# Patient Record
Sex: Female | Born: 1935 | Race: White | Hispanic: No | Smoking: Former smoker
Health system: Southern US, Community
[De-identification: ages and names within clinical notes are randomized; demographics above are authoritative.]

## PROBLEM LIST (undated history)

## (undated) DIAGNOSIS — E039 Hypothyroidism, unspecified: Secondary | ICD-10-CM

## (undated) DIAGNOSIS — I5022 Chronic systolic (congestive) heart failure: Secondary | ICD-10-CM

## (undated) DIAGNOSIS — I480 Paroxysmal atrial fibrillation: Secondary | ICD-10-CM

## (undated) DIAGNOSIS — C801 Malignant (primary) neoplasm, unspecified: Secondary | ICD-10-CM

## (undated) DIAGNOSIS — I10 Essential (primary) hypertension: Secondary | ICD-10-CM

## (undated) DIAGNOSIS — E785 Hyperlipidemia, unspecified: Secondary | ICD-10-CM

## (undated) DIAGNOSIS — R609 Edema, unspecified: Secondary | ICD-10-CM

## (undated) DIAGNOSIS — I5020 Unspecified systolic (congestive) heart failure: Secondary | ICD-10-CM

## (undated) DIAGNOSIS — R0600 Dyspnea, unspecified: Secondary | ICD-10-CM

## (undated) DIAGNOSIS — J449 Chronic obstructive pulmonary disease, unspecified: Secondary | ICD-10-CM

## (undated) DIAGNOSIS — I8390 Asymptomatic varicose veins of unspecified lower extremity: Secondary | ICD-10-CM

## (undated) DIAGNOSIS — H3530 Unspecified macular degeneration: Secondary | ICD-10-CM

## (undated) HISTORY — DX: Hyperlipidemia, unspecified: E78.5

## (undated) HISTORY — DX: Essential (primary) hypertension: I10

## (undated) HISTORY — DX: Asymptomatic varicose veins of unspecified lower extremity: I83.90

## (undated) HISTORY — DX: Unspecified macular degeneration: H35.30

## (undated) HISTORY — DX: Chronic obstructive pulmonary disease, unspecified: J44.9

---
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
## 2010-01-15 ENCOUNTER — Emergency Department: Payer: Self-pay | Admitting: Emergency Medicine

## 2010-07-28 HISTORY — PX: SALIVARY GLAND SURGERY: SHX768

## 2010-08-13 ENCOUNTER — Ambulatory Visit: Payer: Self-pay | Admitting: Unknown Physician Specialty

## 2010-08-15 LAB — PATHOLOGY REPORT

## 2011-08-14 ENCOUNTER — Ambulatory Visit: Payer: Self-pay | Admitting: Unknown Physician Specialty

## 2011-08-14 LAB — PLATELET COUNT: Platelet: 252 10*3/uL (ref 150–440)

## 2011-08-14 LAB — APTT: Activated PTT: 30.2 secs (ref 23.6–35.9)

## 2011-08-14 LAB — PROTIME-INR
INR: 0.9
Prothrombin Time: 12.1 secs (ref 11.5–14.7)

## 2012-05-04 ENCOUNTER — Ambulatory Visit: Payer: Self-pay | Admitting: Gastroenterology

## 2012-05-05 LAB — PATHOLOGY REPORT

## 2012-06-03 ENCOUNTER — Ambulatory Visit: Payer: Self-pay | Admitting: Family Medicine

## 2012-06-03 LAB — CBC WITH DIFFERENTIAL/PLATELET
Basophil #: 0 10*3/uL (ref 0.0–0.1)
Basophil %: 0.3 %
Eosinophil #: 0 10*3/uL (ref 0.0–0.7)
HCT: 44.8 % (ref 35.0–47.0)
HGB: 14.6 g/dL (ref 12.0–16.0)
Lymphocyte %: 8.9 %
MCHC: 32.5 g/dL (ref 32.0–36.0)
Monocyte %: 3.7 %
Neutrophil %: 86.9 %
RDW: 12.4 % (ref 11.5–14.5)
WBC: 14.3 10*3/uL — ABNORMAL HIGH (ref 3.6–11.0)

## 2012-06-03 LAB — BASIC METABOLIC PANEL
BUN: 17 mg/dL (ref 7–18)
Calcium, Total: 9.1 mg/dL (ref 8.5–10.1)
Co2: 31 mmol/L (ref 21–32)
EGFR (African American): >60
EGFR (Non-African Amer.): >60
Glucose: 133 mg/dL — ABNORMAL HIGH (ref 65–99)
Osmolality: 279 (ref 275–301)
Potassium: 4 mmol/L (ref 3.5–5.1)
Sodium: 138 mmol/L (ref 136–145)

## 2012-06-04 ENCOUNTER — Emergency Department: Payer: Self-pay | Admitting: Emergency Medicine

## 2012-06-04 LAB — COMPREHENSIVE METABOLIC PANEL
Anion Gap: 11 (ref 7–16)
BUN: 14 mg/dL (ref 7–18)
Bilirubin,Total: 0.9 mg/dL (ref 0.2–1.0)
Calcium, Total: 8.8 mg/dL (ref 8.5–10.1)
Co2: 24 mmol/L (ref 21–32)
Creatinine: 0.63 mg/dL (ref 0.60–1.30)
EGFR (Non-African Amer.): >60
Glucose: 124 mg/dL — ABNORMAL HIGH (ref 65–99)
Osmolality: 278 (ref 275–301)
Potassium: 5.4 mmol/L — ABNORMAL HIGH (ref 3.5–5.1)
Sodium: 138 mmol/L (ref 136–145)

## 2012-06-04 LAB — CBC
HCT: 42.4 % (ref 35.0–47.0)
HGB: 14.1 g/dL (ref 12.0–16.0)
MCH: 31.4 pg (ref 26.0–34.0)
MCHC: 33.2 g/dL (ref 32.0–36.0)
MCV: 95 fL (ref 80–100)
RBC: 4.48 10*6/uL (ref 3.80–5.20)
RDW: 12.5 % (ref 11.5–14.5)
WBC: 13.9 10*3/uL — ABNORMAL HIGH (ref 3.6–11.0)

## 2012-06-04 LAB — LIPASE, BLOOD: Lipase: 141 U/L (ref 73–393)

## 2012-06-04 LAB — PROTIME-INR: INR: 0.9

## 2012-06-16 ENCOUNTER — Other Ambulatory Visit: Payer: Self-pay | Admitting: Gastroenterology

## 2013-07-28 HISTORY — PX: MOHS SURGERY: SUR867

## 2015-09-06 ENCOUNTER — Encounter: Payer: Self-pay | Admitting: *Deleted

## 2015-09-10 ENCOUNTER — Ambulatory Visit (INDEPENDENT_AMBULATORY_CARE_PROVIDER_SITE_OTHER): Payer: Medicare Other | Admitting: Obstetrics and Gynecology

## 2015-09-10 ENCOUNTER — Encounter: Payer: Self-pay | Admitting: Obstetrics and Gynecology

## 2015-09-10 VITALS — BP 192/84 | HR 91 | Resp 16 | Ht 60.0 in | Wt 103.5 lb

## 2015-09-10 DIAGNOSIS — N819 Female genital prolapse, unspecified: Secondary | ICD-10-CM | POA: Diagnosis not present

## 2015-09-10 DIAGNOSIS — R350 Frequency of micturition: Secondary | ICD-10-CM | POA: Diagnosis not present

## 2015-09-10 LAB — BLADDER SCAN AMB NON-IMAGING

## 2015-09-10 NOTE — Progress Notes (Signed)
09/10/2015 4:56 PM   Sharion Balloon June 11, 1933 [id number]  Referring provider: No referring provider defined for this encounter.  Chief Complaint  Patient presents with  . Urinary Frequency  . Establish Care    HPI: Patient is a 82yo female presenting today as a referral form her primary care provider with multiple urinary complaints including urinary frequency, urgency, dysuria, nocturia (4-5 times per night), difficulty urinating and weak stream.  She also reports that she has noticed significant pressure/bulging in her perennial area over the last 5-6 years. She has not been previously evaluated for this. She has not had a hysterectomy.   Patient reports that she has been experiencing significant urinary symptoms as well as vaginal pressure for many years. She reports that she has been repeatedly treated by her PCP with antibiotics though they do not seem to improve her symptoms at all.   She saturates at least 4-5 pads per day.  She reports that she feels like she constantly leaks. She occasionally does have to strain to empty her bladder though she feels that she empties well. She noticed that her vaginal pressure becomes worse throughout the day and is relieved at night   She denies any gross hematuria or dysuria. No vaginal itching, irritation or dryness.   PMH: Past Medical History  Diagnosis Date  . HLD (hyperlipidemia)   . HTN (hypertension)   . Varicosities   . COPD, mild [facility])     Surgical History: Past Surgical History  Procedure Laterality Date  . Tonsillectomy  1940  . Mohs surgery  2015    nose  . Salivary gland surgery Left 2012    Home Medications:    Medication List       This list is accurate as of: 09/10/15  4:56 PM.  Always use your most recent med list.               atenolol 25 MG tablet  Commonly known as:  TENORMIN  Take 25 mg by mouth.     CALTRATE 600 PO  Take by mouth.     CO Q 10 PO  Take by mouth.     FISH OIL CONCENTRATE PO   Take by mouth.     lovastatin 20 MG tablet  Commonly known as:  MEVACOR  Take 20 mg by mouth at bedtime.        Allergies: No Known Allergies  Family History: Family History  Problem Relation Age of Onset  . COPD Father   . Diverticulitis Mother   . Heart failure Father   . Epilepsy Sister   . Parkinson's disease Brother   . Macular degeneration Mother     Social History:  reports that she quit smoking about 10 years ago. She does not have any smokeless tobacco history on file. She reports that she drinks alcohol. Her drug history is not on file.  ROS: UROLOGY Frequent Urination?: Yes Hard to postpone urination?: Yes Burning/pain with urination?: Yes Get up at night to urinate?: Yes Leakage of urine?: Yes Urine stream starts and stops?: No Trouble starting stream?: Yes Do you have to strain to urinate?: Yes Blood in urine?: Yes Urinary tract infection?: No Sexually transmitted disease?: No Injury to kidneys or bladder?: No Painful intercourse?: No Weak stream?: Yes Currently pregnant?: No Vaginal bleeding?: No Last menstrual period?: n  Gastrointestinal Nausea?: No Vomiting?: No Indigestion/heartburn?: No Diarrhea?: No Constipation?: No  Constitutional Fever: No Night sweats?: No Weight loss?: Yes Fatigue?: No  Skin Skin  rash/lesions?: No Itching?: Yes  Eyes Blurred vision?: Yes Double vision?: No  Ears/Nose/Throat Sore throat?: No Sinus problems?: No  Hematologic/Lymphatic Swollen glands?: No Easy bruising?: Yes  Cardiovascular Leg swelling?: Yes Chest pain?: No  Respiratory Cough?: No Shortness of breath?: Yes  Endocrine Excessive thirst?: No  Musculoskeletal Back pain?: Yes Joint pain?: No  Neurological Headaches?: No Dizziness?: No  Psychologic Depression?: No Anxiety?: No  Physical Exam: BP 192/84 mmHg  Pulse 91  Resp 16  Ht 5' (1.524 m)  Wt 103 lb 8 oz (46.947 kg)  BMI 20.21 kg/m2  Constitutional:  Alert  and oriented, No acute distress. HEENT: [place] AT, moist mucus membranes.  Trachea midline, no masses. Cardiovascular: No clubbing, cyanosis, or edema. Respiratory: Normal respiratory effort, no increased work of breathing. GI: Abdomen is soft, nontender, nondistended, no abdominal masses GU: No CVA tenderness.  Pelvic exam: Significant pelvic organ prolapse, grade 3 enterocele, cystocele and rectocele also present, pale vaginal mucosa, normal urethral meatus Skin: No rashes, bruises or suspicious lesions. Lymph: No cervical or inguinal adenopathy. Neurologic: Grossly intact, no focal deficits, moving all 4 extremities. Psychiatric: Normal mood and affect.  Laboratory Data:  Lab Results  Component Value Date   WBC 13.9* 06/04/2012   HGB 14.1 06/04/2012   HCT 42.4 06/04/2012   MCV 95 06/04/2012   PLT 263 06/04/2012    Lab Results  Component Value Date   CREATININE 0.63 06/04/2012    No results found for: PSA  No results found for: TESTOSTERONE  No results found for: HGBA1C  Urinalysis No results found for: COLORURINE, APPEARANCEUR, LABSPEC, PHURINE, GLUCOSEU, HGBUR, BILIRUBINUR, KETONESUR, PROTEINUR, UROBILINOGEN, NITRITE, LEUKOCYTESUR  Pertinent Imaging:   Assessment & Plan:    1. Pelvic Organ Prolapse-  Enterocele, cystocele and rectocele.   The patient is not interested in surgical intervention at this time. We will refer her for a pessary evaluation with GYN. -Referral Gyn- patient requested Dr. Enzo Bi.  2. Urinary frequency/incontinence-  Most likely related to her significant. He. We will reevaluate her symptoms in 4 months after she has been evaluated by GYN for possible pessary placement. - Urinalysis, Complete - BLADDER SCAN AMB NON-IMAGING   Return in about 4 months (around 01/08/2016).  These notes generated with voice recognition software. I apologize for typographical errors.  Herbert Moors, [facility] [address] [place], [place] [address] [phone]

## 2015-09-10 NOTE — Patient Instructions (Addendum)
Overview  The pelvic organs, including the bladder, are normally supported by pelvic floor muscles and ligaments.  When these muscles and ligaments are stretched, weakened or torn, the wall between the bladder and the vagina sags or herniates causing a prolapse, sometimes called a cystocele.  This condition may cause discomfort and problems with emptying the bladder.  It can be present in various stages.  Some people are not aware of the changes.  Others may notice changes at the vaginal opening or a feeling of the bladder dropping outside the body.  Causes of a Cystocele  A cystocele is usually caused by muscle straining or stretching during childbirth.  In addition, cystocele is more common after menopause, because the hormone estrogen helps keep the elastic tissues around the pelvic organs strong.  A cystocele is more likely to occur when levels of estrogen decrease.  Other causes include: heavy lifting, chronic coughing, previous pelvic surgery and obesity.  Symptoms  A bladder that has dropped from its normal position may cause: unwanted urine leakage (stress incontinence), frequent urination or urge to urinate, incomplete emptying of the bladder (not feeling bladder relief after emptying), pain or discomfort in the vagina, pelvis, groin, lower back or lower abdomen and frequent urinary tract infections.  Mild cases may not cause any symptoms.  Treatment Options  Pelvic floor (Kegel) exercises:  Strength training the muscles in your genital area  Behavioral changes: Treating and preventing constipation, taking time to empty your bladder properly, learning to lift properly and/or avoid heavy lifting when possible, stopping smoking, avoiding weight gain and treating a chronic cough or bronchitis.  A pessary: A vaginal support device is sometimes used to help pelvic support caused by muscle and ligament changes.  Surgery: Surgical repair may be necessary if symptoms cannot be managed with  exercise, behavioral changes and a pessary.  Surgery is usually considered for severe cases.   2007, [employer]  Kegel Exercises The goal of Kegel exercises is to isolate and exercise your pelvic floor muscles. These muscles act as a hammock that supports the rectum, vagina, small intestine, and uterus. As the muscles weaken, the hammock sags and these organs are displaced from their normal positions. Kegel exercises can strengthen your pelvic floor muscles and help you to improve bladder and bowel control, improve sexual response, and help reduce many problems and some discomfort during pregnancy. Kegel exercises can be done anywhere and at any time. HOW TO PERFORM KEGEL EXERCISES 5. Locate your pelvic floor muscles. To do this, squeeze (contract) the muscles that you use when you try to stop the flow of urine. You will feel a tightness in the vaginal area (women) and a tight lift in the rectal area (men and women). 6. When you begin, contract your pelvic muscles tight for 2-5 seconds, then relax them for 2-5 seconds. This is one set. Do 4-5 sets with a short pause in between. 7. Contract your pelvic muscles for 8-10 seconds, then relax them for 8-10 seconds. Do 4-5 sets. If you cannot contract your pelvic muscles for 8-10 seconds, try 5-7 seconds and work your way up to 8-10 seconds. Your goal is 4-5 sets of 10 contractions each day. Keep your stomach, buttocks, and legs relaxed during the exercises. Perform sets of both short and long contractions. Vary your positions. Perform these contractions 3-4 times per day. Perform sets while you are:   Lying in bed in the morning.  Standing at lunch.  Sitting in the late afternoon.  Lying  in bed at night. You should do 40-50 contractions per day. Do not perform more Kegel exercises per day than recommended. Overexercising can cause muscle fatigue. Continue these exercises for for at least 15-20 weeks or as directed by your caregiver.    This information is not intended to replace advice given to you by your health care provider. Make sure you discuss any questions you have with your health care provider.   Document Released: 06/30/2012 Document Revised: 08/04/2014 Document Reviewed: 06/30/2012 Elsevier Interactive Patient Education [employer].

## 2015-09-11 LAB — URINALYSIS, COMPLETE
Bilirubin, UA: NEGATIVE
Glucose, UA: NEGATIVE
Ketones, UA: NEGATIVE
Leukocytes, UA: NEGATIVE
Nitrite, UA: NEGATIVE
PH UA: 5.5 (ref 5.0–7.5)
PROTEIN UA: NEGATIVE
RBC UA: NEGATIVE
Specific Gravity, UA: 1.01 (ref 1.005–1.030)
Urobilinogen, Ur: 0.2 mg/dL (ref 0.2–1.0)

## 2015-09-11 LAB — MICROSCOPIC EXAMINATION
BACTERIA UA: NONE SEEN
RBC, UA: NONE SEEN /hpf (ref 0–?)

## 2015-09-12 ENCOUNTER — Telehealth: Payer: Self-pay | Admitting: Obstetrics and Gynecology

## 2015-09-12 NOTE — Telephone Encounter (Signed)
Pt called and requested that Ria Comment call her back.  She would like to discuss her visit from the other day and has multiple questions.  Please call.

## 2015-09-12 NOTE — Telephone Encounter (Signed)
Patient called stating that she would like to see a female GYN provider instead of Dr. Enzo Bi. Please switch the referral for her to see Dr. Marcelline Mates at [facility] for a pessary evaluation for pelvic organ prolapse. Thanks

## 2015-09-13 NOTE — Telephone Encounter (Signed)
Ok i put that you wanted her to see dr. Marcelline Mates   Thanks, Sharyn Lull

## 2015-10-17 ENCOUNTER — Encounter: Payer: Self-pay | Admitting: Obstetrics and Gynecology

## 2015-10-17 ENCOUNTER — Ambulatory Visit (INDEPENDENT_AMBULATORY_CARE_PROVIDER_SITE_OTHER): Payer: Medicare Other | Admitting: Obstetrics and Gynecology

## 2015-10-17 VITALS — BP 189/93 | HR 91 | Ht 60.0 in | Wt 103.7 lb

## 2015-10-17 DIAGNOSIS — N8110 Cystocele, unspecified: Secondary | ICD-10-CM

## 2015-10-17 DIAGNOSIS — N3941 Urge incontinence: Secondary | ICD-10-CM

## 2015-10-17 DIAGNOSIS — N952 Postmenopausal atrophic vaginitis: Secondary | ICD-10-CM

## 2015-10-17 DIAGNOSIS — R339 Retention of urine, unspecified: Secondary | ICD-10-CM | POA: Diagnosis not present

## 2015-10-17 DIAGNOSIS — IMO0002 Reserved for concepts with insufficient information to code with codable children: Secondary | ICD-10-CM

## 2015-10-17 DIAGNOSIS — N813 Complete uterovaginal prolapse: Secondary | ICD-10-CM

## 2015-10-17 DIAGNOSIS — R140 Abdominal distension (gaseous): Secondary | ICD-10-CM

## 2015-10-17 NOTE — Patient Instructions (Signed)
1.  Pelvic ultrasound is scheduled. 2.  Return in 2 weeks for pessary trial

## 2015-10-17 NOTE — Progress Notes (Signed)
GYN ENCOUNTER NOTE  Subjective:       Angela Mason is a 82 y.o. G56P1001 female is here for gynecologic evaluation of the following issues:  1. Pelvic Organ Prolapse 2. Urinary Frequency   3. Rectal Bleeding  82 year old G74P1 female with a hx of macular degeneration, HTN, and hypercholesterolemia comes in for evaluation of pelvic organ prolapse. States she has a bulge from her vagina. Endorses frequent urination 10+ times a day. Endorses 2-5x nocturia. Wears pads. Endorses urge incontinence; no prior medical therapy. No prior treatments. Endorses occasional bright red blood with BM, denies constipation or straining. Has BM daily with some loose stools. Not currently sexually active. 50 pack year smoking history. Quit 10 years ago. Current daily alcohol consumption: 1 drink/ day.    Gynecologic History No LMP recorded. Patient is postmenopausal. No history of abnormal pap smears Last mammogram: 05/2015 . Results were: Normal Last colonoscopy: 2012. Results were: Normal Last DEXA unknown  Takes Daily Calcium and Vitamin D  Previously on HRT for 18 years No vasomotor symptoms  Menopause at age 83  Obstetric History OB History  Gravida Para Term Preterm AB SAB TAB Ectopic Multiple Living  1 1 1       1     # Outcome Date GA Lbr Len/2nd Weight Sex Delivery Anes PTL Lv  1 Term 1967   7 lb 2.4 oz (3.243 kg) F Vag-Spont   Y      Past Medical History  Diagnosis Date  . HLD (hyperlipidemia)   . HTN (hypertension)   . Varicosities   . COPD, mild ([place])   . Macular degeneration, bilateral     Past Surgical History  Procedure Laterality Date  . Tonsillectomy  1940  . Mohs surgery  2015    nose  . Salivary gland surgery Left 2012    Current Outpatient Prescriptions on File Prior to Visit  Medication Sig Dispense Refill  . atenolol (TENORMIN) 25 MG tablet Take 25 mg by mouth.    . Calcium Carbonate (CALTRATE 600 PO) Take by mouth.    . Coenzyme Q10 (CO Q 10 PO) Take by mouth.     . lovastatin (MEVACOR) 20 MG tablet Take 20 mg by mouth at bedtime.    . Omega-3 Fatty Acids (FISH OIL CONCENTRATE PO) Take by mouth.     No current facility-administered medications on file prior to visit.    No Known Allergies  Social History   Social History  . Marital Status: Married    Spouse Name: N/A  . Number of Children: N/A  . Years of Education: N/A   Occupational History  . Not on file.   Social History Main Topics  . Smoking status: Former Smoker    Quit date: 09/05/2005  . Smokeless tobacco: Not on file  . Alcohol Use: 0.0 oz/week    0 Standard drinks or equivalent per week     Comment: 1 qd  . Drug Use: No  . Sexual Activity: Not Currently    Birth Control/ Protection: Post-menopausal   Other Topics Concern  . Not on file   Social History Narrative    Family History  Problem Relation Age of Onset  . COPD Father   . Heart failure Father   . Diverticulitis Mother   . Macular degeneration Mother   . Epilepsy Sister   . Parkinson's disease Brother   . Cancer Neg Hx   . Diabetes Neg Hx     The following portions  of the patient's history were reviewed and updated as appropriate: allergies, current medications, past family history, past medical history, past social history, past surgical history and problem list.  Review of Systems Review of Systems - General ROS: negative for - chills, fatigue, fever, hot flashes, malaise or night sweats Hematological and Lymphatic ROS: negative for - bleeding problems or swollen lymph nodes Gastrointestinal ROS: positive for small amount of blood in stools negative for - abdominal pain,  change in bowel habits and nausea/vomiting Musculoskeletal ROS: negative for - joint pain, muscle pain or muscular weakness Genito-Urinary ROS: positive for incontinence and nocturia negative for - change in menstrual cycle, dysmenorrhea, dyspareunia, dysuria, genital discharge, genital ulcers, hematuria, irregular/heavy menses, or  pelvic pain  Objective:   BP 189/93 mmHg  Pulse 91  Ht 5' (1.524 m)  Wt 103 lb 11.2 oz (47.038 kg)  BMI 20.25 kg/m2 CONSTITUTIONAL: Elderly Well-developed, well-nourished female in no acute distress.  HENT:  Normocephalic, atraumatic.  NECK: Normal range of motion, supple, no masses.  Normal thyroid.  SKIN: Skin is warm and dry. No rash noted. Not diaphoretic. No erythema. No pallor. [place]: Alert and oriented to person, place, and time. PSYCHIATRIC: Normal mood and affect. Normal behavior. Normal judgment and thought content. CARDIOVASCULAR: RRR no m/r/g RESPIRATORY: CTAB BREASTS: Not Examined ABDOMEN: Soft, non distended; Non tender.  No Organomegaly. PELVIC:  External Genitalia: atrophic changes  BUS: normal  Vagina: Atrophic, cornified epithelium, small rectocele, 3rd degree cystocele with traction, uterine procidentia   Cervix: Pinpoint Os  Uterus: Small; total prolapse  Adnexa: Normal  RV: External Hemorrhoids Noted, Normal Sphincter Tone, No Massess  Bladder: Nontender MUSCULOSKELETAL: Normal range of motion. No tenderness.  No cyanosis, clubbing, or edema.  PROCEDURE:  Post Void Residual Volume: 150 mL (straight catheter)    Assessment:   1. Traction Cystocele, 3rd Degree  2. Uterine Procidentia  3. Mild Rectocele, asymptomatic   4. External Hemorrhoid  5.  Vaginal  Atrophy  6.  Incomplete bladder emptying          7.  Urge incontinence   Plan:  1. Pelvic ultrasound secondary to complaint of abdominal bloating  2. Pessary Trial--2 weeks;  Anticipate improvement in voiding with pessary  3.  Consider anticholinergic medication for  Urge incontinence  4.  Patient will need long-term estrogen therapy improve vaginal health during pessary use   Luz Lex, PA-S  Brayton Mars, MD   I have seen, interviewed, and examined the patient in conjunction with the [facility].A. student and affirm the diagnosis and management plan. Martin A.  DeFrancesco, MD, FACOG   Note: This dictation was prepared with Dragon dictation along with smaller phrase technology. Any transcriptional errors that result from this process are unintentional.

## 2015-10-23 ENCOUNTER — Ambulatory Visit (INDEPENDENT_AMBULATORY_CARE_PROVIDER_SITE_OTHER): Payer: Medicare Other

## 2015-10-23 DIAGNOSIS — R140 Abdominal distension (gaseous): Secondary | ICD-10-CM | POA: Diagnosis not present

## 2015-11-01 ENCOUNTER — Encounter: Payer: Self-pay | Admitting: Obstetrics and Gynecology

## 2015-11-01 ENCOUNTER — Ambulatory Visit (INDEPENDENT_AMBULATORY_CARE_PROVIDER_SITE_OTHER): Payer: Medicare Other | Admitting: Obstetrics and Gynecology

## 2015-11-01 VITALS — BP 164/79 | HR 83 | Ht 60.0 in | Wt 104.0 lb

## 2015-11-01 DIAGNOSIS — R339 Retention of urine, unspecified: Secondary | ICD-10-CM | POA: Diagnosis not present

## 2015-11-01 DIAGNOSIS — N8111 Cystocele, midline: Secondary | ICD-10-CM | POA: Insufficient documentation

## 2015-11-01 DIAGNOSIS — N813 Complete uterovaginal prolapse: Secondary | ICD-10-CM | POA: Diagnosis not present

## 2015-11-01 DIAGNOSIS — N8110 Cystocele, unspecified: Secondary | ICD-10-CM | POA: Diagnosis not present

## 2015-11-01 DIAGNOSIS — IMO0002 Reserved for concepts with insufficient information to code with codable children: Secondary | ICD-10-CM

## 2015-11-01 NOTE — Patient Instructions (Signed)
1. Pessary will be ordered today-#2 ring with support 2. Patient will be contacted by telephone when pessary arrives for insertion

## 2015-11-01 NOTE — Progress Notes (Signed)
Chief complaint: 1. Pessary fitting 2. Uterine procidentia 3. Traction cystocele, third-degree 4. Mild rectocele, asymptomatic 5. Incomplete bladder emptying 6. Urge incontinence 7. Vaginal atrophy   OBJECTIVE: BP 164/79 mmHg  Pulse 83  Ht 5' (1.524 m)  Wt 104 lb (47.174 kg)  BMI 20.31 kg/m2   PELVIC: External Genitalia: atrophic changes BUS: normal Vagina: Atrophic, cornified epithelium, small rectocele, 3rd degree cystocele with traction, uterine procidentia  Cervix: Pinpoint Os Uterus: Small; total prolapse Adnexa: Normal RV: External Hemorrhoids Noted, Normal Sphincter Tone, No Massess Bladder: Non tender   PROCEDURE:  Pessary fitting  #4 ring with support pessary-too large  #3 ring with support pessary-patient slightly feels; excellent voiding; unable to push out; appears to fit well  #2 ring with support pessary-patient cannot feel pessary; voiding is excellent; unable to push out   ASSESSMENT: 1. Successful pessary fitting  PLAN: 1. #2 ring with support pessary is to be ordered 2. Patient will be contacted by phone to return for insertion. 3. Multiple questions were answered regarding pessary maintenance.  A total of 25 minutes were spent face-to-face with the patient during this encounter and over half of that time involved counseling and coordination of care.  Brayton Mars, MD  Note: This dictation was prepared with Dragon dictation along with smaller phrase technology. Any transcriptional errors that result from this process are unintentional.

## 2015-11-21 ENCOUNTER — Encounter: Payer: Self-pay | Admitting: Obstetrics and Gynecology

## 2015-11-21 ENCOUNTER — Ambulatory Visit (INDEPENDENT_AMBULATORY_CARE_PROVIDER_SITE_OTHER): Payer: Medicare Other | Admitting: Obstetrics and Gynecology

## 2015-11-21 VITALS — BP 181/94 | HR 86 | Wt 104.1 lb

## 2015-11-21 DIAGNOSIS — N813 Complete uterovaginal prolapse: Secondary | ICD-10-CM

## 2015-11-21 DIAGNOSIS — N8110 Cystocele, unspecified: Secondary | ICD-10-CM | POA: Diagnosis not present

## 2015-11-21 DIAGNOSIS — R339 Retention of urine, unspecified: Secondary | ICD-10-CM

## 2015-11-21 DIAGNOSIS — IMO0002 Reserved for concepts with insufficient information to code with codable children: Secondary | ICD-10-CM

## 2015-11-21 NOTE — Progress Notes (Signed)
Chief: 1. Pessary insertion 2. Uterine procidentia 3. Traction cystocele, third-degree 4. Mild rectocele, asymptomatic 5. Incomplete bladder emptying 6. Urge incontinence 7. Vaginal atrophy  Patient presents for ring with support pessary insertion.  OBJECTIVE: BP 181/94 mmHg  Pulse 86  Wt 104 lb 2 oz (47.231 kg)  PELVIC: External Genitalia: atrophic changes BUS: normal Vagina: Atrophic, cornified epithelium, small rectocele, 3rd degree cystocele with traction, uterine procidentia  Cervix: Pinpoint Os Uterus: Small; total prolapse Adnexa: Normal RV: External Hemorrhoids Noted, Normal Sphincter Tone, No Massess Bladder: Non tender   PROCEDURE: Pessary insertion  #2 ring with support pessary is inserted. Instructions for Trimosan use are given  ASSESSMENT: 1. Procidentia with traction cystocele 2. Pessary insertion  PLAN: 1.#2 ring with support pessary is inserted. Instructions for Carin Hock use are given 2. Return in 2 weeks for pessary assessment/maintenance  A total of 15 minutes were spent face-to-face with the patient during this encounter and over half of that time dealt with counseling and coordination of care.  Note: This dictation was prepared with Dragon dictation along with smaller phrase technology. Any transcriptional errors that result from this process are unintentional.

## 2015-11-21 NOTE — Patient Instructions (Signed)
1. #2 ring with support pessary is inserted 2. Instructions for Carin Hock use are given 3. Return in 2 weeks for pessary maintenance

## 2015-12-06 ENCOUNTER — Ambulatory Visit (INDEPENDENT_AMBULATORY_CARE_PROVIDER_SITE_OTHER): Payer: Medicare Other | Admitting: Obstetrics and Gynecology

## 2015-12-06 ENCOUNTER — Ambulatory Visit: Payer: Medicare Other | Admitting: Obstetrics and Gynecology

## 2015-12-06 ENCOUNTER — Encounter: Payer: Self-pay | Admitting: Obstetrics and Gynecology

## 2015-12-06 VITALS — BP 113/69 | HR 83 | Ht 60.0 in | Wt 103.8 lb

## 2015-12-06 DIAGNOSIS — N813 Complete uterovaginal prolapse: Secondary | ICD-10-CM | POA: Diagnosis not present

## 2015-12-06 DIAGNOSIS — IMO0002 Reserved for concepts with insufficient information to code with codable children: Secondary | ICD-10-CM

## 2015-12-06 DIAGNOSIS — N8110 Cystocele, unspecified: Secondary | ICD-10-CM

## 2015-12-06 DIAGNOSIS — R339 Retention of urine, unspecified: Secondary | ICD-10-CM

## 2015-12-06 DIAGNOSIS — N952 Postmenopausal atrophic vaginitis: Secondary | ICD-10-CM

## 2015-12-06 NOTE — Patient Instructions (Signed)
1. Return in 4 weeks for pessary maintenance 2. Possible tiny cervical biopsy at next appointment

## 2015-12-06 NOTE — Progress Notes (Signed)
Chief complaint: 1. Pelvic organ prolapse 2. Pessary check    patient presents for two-week follow-up on pessary for management of symptomatic cystocele, rectocele, and uterine prolapse. She is not having any significant vaginal discharge, vaginal bleeding, or pelvic pain. Bladder function is improved with more complete voiding. However, she still has episodes of incontinence at night.    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 Normal pessary maintenance   PLAN:  1. Return in 4 weeks for pessary maintenance  2. Continue Trimosan gel weekly  A total of 15 minutes were spent face-to-face with the patient during this encounter and over half of that time dealt with counseling and coordination of care.   Brayton Mars, MD  Note: This dictation was prepared with Dragon dictation along with smaller phrase technology. Any transcriptional errors that result from this process are unintentional.

## 2016-01-07 ENCOUNTER — Ambulatory Visit: Payer: Medicare Other | Admitting: Urology

## 2016-01-08 ENCOUNTER — Ambulatory Visit: Payer: Medicare Other | Admitting: Obstetrics and Gynecology

## 2016-01-09 ENCOUNTER — Ambulatory Visit: Payer: Medicare Other | Admitting: Obstetrics and Gynecology

## 2016-01-23 ENCOUNTER — Encounter: Payer: Self-pay | Admitting: Obstetrics and Gynecology

## 2016-01-23 ENCOUNTER — Ambulatory Visit (INDEPENDENT_AMBULATORY_CARE_PROVIDER_SITE_OTHER): Payer: Medicare Other | Admitting: Obstetrics and Gynecology

## 2016-01-23 VITALS — BP 182/81 | HR 74 | Ht 60.0 in | Wt 101.2 lb

## 2016-01-23 DIAGNOSIS — N8110 Cystocele, unspecified: Secondary | ICD-10-CM | POA: Diagnosis not present

## 2016-01-23 DIAGNOSIS — N813 Complete uterovaginal prolapse: Secondary | ICD-10-CM

## 2016-01-23 DIAGNOSIS — R339 Retention of urine, unspecified: Secondary | ICD-10-CM

## 2016-01-23 DIAGNOSIS — N952 Postmenopausal atrophic vaginitis: Secondary | ICD-10-CM

## 2016-01-23 DIAGNOSIS — IMO0002 Reserved for concepts with insufficient information to code with codable children: Secondary | ICD-10-CM

## 2016-01-23 NOTE — Progress Notes (Signed)
Chief complaint: 1. Pessary maintenance 2. Uterine procidentia 3. Traction cystocele, third-degree 4. Mild rectocele, asymptomatic 5. Incomplete bladder emptying 6. Urge incontinence 7. Vaginal atrophy  Patient is using a #2 ring with support pessary.  patient presents for 6 week follow-up on pessary for management of symptomatic cystocele, rectocele, and uterine prolapse. She is not having any significant vaginal discharge, vaginal bleeding, or pelvic pain. She does state that the prolapse occasionally comes down around the pessary. I have recommended that she contact us and come in for an acute visit if needed to reinsert the pessary.  Past medical history, past surgical history, problem list, medications, and allergies are reviewed  OBJECTIVE:BP 182/81 mmHg  Pulse 74  Ht 5' (1.524 m)  Wt 101 lb 3.2 oz (45.904 kg)  BMI 19.76 kg/m2 OBJECTIVE: BP 113/69 mmHg  Pulse 83  Ht 5' (1.524 m)  Wt 103 lb 12.8 oz (47.083 kg)  BMI 20.27 kg/m2 PELVIC: External Genitalia: atrophic changes BUS: normal Vagina: Atrophic, cornified epithelium, small rectocele, 3rd degree cystocele with traction, uterine procidentia  Cervix: Pinpoint Os ; previously noted lesion is resolved on cervix at 11:00 Uterus: Small; total prolapse Adnexa: Normal RV: External Hemorrhoids Noted, Normal Sphincter Tone, No Massess Bladder: Non tender   PROCEDURE: #2 ring with suppors pessary is removed, cleaned, and reinserted  ASSESSMENT: 1.procidentia with traction cystocele  2. Normal pessary maintenance  PLAN: 1. Return in 8 weeks for pessary maintenance  2. Continue Trimosan gel weekly  Brayton Mars, MD  Note: This dictation was prepared with Dragon dictation along with smaller phrase technology. Any transcriptional errors that result from this process are unintentional.

## 2016-01-23 NOTE — Patient Instructions (Signed)
1. Continue using Trimosan gel once a week as directed. Insert approximately [id number] inches applicator gel 2.  Return 8 weeks for pessary maintenance

## 2016-03-25 ENCOUNTER — Ambulatory Visit (INDEPENDENT_AMBULATORY_CARE_PROVIDER_SITE_OTHER): Payer: Medicare Other | Admitting: Obstetrics and Gynecology

## 2016-03-25 ENCOUNTER — Encounter: Payer: Self-pay | Admitting: Obstetrics and Gynecology

## 2016-03-25 VITALS — BP 176/82 | HR 74 | Ht 60.0 in | Wt 101.9 lb

## 2016-03-25 DIAGNOSIS — N813 Complete uterovaginal prolapse: Secondary | ICD-10-CM | POA: Diagnosis not present

## 2016-03-25 DIAGNOSIS — R339 Retention of urine, unspecified: Secondary | ICD-10-CM

## 2016-03-25 DIAGNOSIS — IMO0002 Reserved for concepts with insufficient information to code with codable children: Secondary | ICD-10-CM

## 2016-03-25 DIAGNOSIS — N8110 Cystocele, unspecified: Secondary | ICD-10-CM | POA: Diagnosis not present

## 2016-03-25 DIAGNOSIS — N952 Postmenopausal atrophic vaginitis: Secondary | ICD-10-CM | POA: Diagnosis not present

## 2016-03-25 NOTE — Progress Notes (Signed)
Chief complaint: 1. Pessary maintenance 2. Uterine procidentia 3. Traction cystocele, third-degree 4. Mild rectocele, asymptomatic 5. Incomplete bladder emptying 6. Urge incontinence 7. Vaginal atrophy  Patient is using a #2 ring with support pessary. Last visit 01/23/2016  Patient presents for 8 week follow-up on pessary for management of symptomatic cystocele, rectocele, and uterine prolapse. She is not having any significant vaginal discharge, vaginal bleeding, or pelvic pain. She does state that the prolapse occasionally comes down around the pessary. I have recommended that she contact us and come in for an acute visit if needed to reinsert the pessary.  Past medical history, past surgical history, problem list, medications, and allergies are reviewed  OBJECTIVE: BP (!) 176/82   Pulse 74   Ht 5' (1.524 m)   Wt 101 lb 14.4 oz (46.2 kg)   BMI 19.90 kg/m  PELVIC: External Genitalia: atrophic changes BUS: normal Vagina: Atrophic, cornified epithelium, small rectocele, 3rd degree cystocele with traction, uterine procidentia  Cervix: Pinpoint Os ; previously noted lesion is resolved on cervix at 11:00 Uterus: Small; total prolapse Adnexa: Normal RV: External Hemorrhoids Noted, Normal Sphincter Tone, No Massess Bladder: Non tender   PROCEDURE: #2 ring with suppors pessary is removed, cleaned, and reinserted   ASSESSMENT: 1. Procidentia with traction cystocele  2. Normal pessary maintenance  PLAN: 1. Return in 8 weeks for pessary maintenance  2. Continue Trimosan gel weekly 3. May want to consider gelhorn pessary if the prolapse tends to continue slipping around the ring with support pessary   Brayton Mars, MD  Note: This dictation was prepared with Dragon dictation along with smaller phrase technology. Any transcriptional errors that result from this  process are unintentional.

## 2016-03-25 NOTE — Patient Instructions (Signed)
1. Return in 10 weeks for pessary maintenance 2. Return sooner if pessary appears to be malpositioned or if you are unable to adjust pessary yourself 3. Continue using Trimosan gel weekly

## 2016-06-03 ENCOUNTER — Ambulatory Visit (INDEPENDENT_AMBULATORY_CARE_PROVIDER_SITE_OTHER): Payer: Medicare Other | Admitting: Obstetrics and Gynecology

## 2016-06-03 ENCOUNTER — Encounter: Payer: Self-pay | Admitting: Obstetrics and Gynecology

## 2016-06-03 VITALS — BP 146/75 | HR 88 | Ht 60.0 in | Wt 101.5 lb

## 2016-06-03 DIAGNOSIS — N952 Postmenopausal atrophic vaginitis: Secondary | ICD-10-CM

## 2016-06-03 DIAGNOSIS — N813 Complete uterovaginal prolapse: Secondary | ICD-10-CM

## 2016-06-03 DIAGNOSIS — Z4689 Encounter for fitting and adjustment of other specified devices: Secondary | ICD-10-CM | POA: Diagnosis not present

## 2016-06-03 NOTE — Progress Notes (Signed)
Chief complaint: 1. Procidentia with traction cystocele 2. Pessary maintenance  Patient reports that she is still experiencing some tissue falling down around the pessary. She is using a #2 ring with support pessary for management of third-degree traction cystocele and procidentia.  Patient denies vaginal bleeding, vaginal discharge, pelvic pain.  OBJECTIVE: BP (!) 146/75   Pulse 88   Ht 5' (1.524 m)   Wt 101 lb 8 oz (46 kg)   BMI 19.82 kg/m  Patient pleasant well-appearing emailing External genitalia-normal BUS-normal Vagina-pessary appears to be in appropriate position with minimal tissue prolapsing around pessary; pessary is removed; gel horn pessary is inserted as a trial Cervix-normal Uterus-normal Adnexa-nonpalpable and non tender  PROCEDURE: Pessary fitting  Gel horn pessary-57 mm  ASSESSMENT: 1. Procidentia with traction cystocele, third-degree 2. Mild rectocele 3. Vaginal atrophy 4. Suboptimal support with a #2 ring with support pessary 5. Gel horn pessary trial-57 mm successful  PLAN: 1. Gel horn pessary 57 mm is ordered 2. Patient will return for gel horn pessary insertion when it arrives 3. #2 ring with support pessary is reinserted today until the pessary arrives  A total of 25 minutes were spent face-to-face with the patient during this encounter and over half of that time involved counseling and coordination of care.  Brayton Mars, MD  Note: This dictation was prepared with Dragon dictation along with smaller phrase technology. Any transcriptional errors that result from this process are unintentional.

## 2016-06-03 NOTE — Patient Instructions (Signed)
1. Gel horn pessary 57 mm is fitted today. Pessary is ordered. 2. Return in approximately 1 week for gel horn pessary insertion 3. Continue using #2 ring with support pessary until the new pessary arrives

## 2016-07-02 ENCOUNTER — Encounter: Payer: Self-pay | Admitting: Obstetrics and Gynecology

## 2016-07-02 ENCOUNTER — Ambulatory Visit (INDEPENDENT_AMBULATORY_CARE_PROVIDER_SITE_OTHER): Payer: Medicare Other | Admitting: Obstetrics and Gynecology

## 2016-07-02 VITALS — BP 151/68 | HR 63 | Ht 60.0 in | Wt 101.4 lb

## 2016-07-02 DIAGNOSIS — R339 Retention of urine, unspecified: Secondary | ICD-10-CM

## 2016-07-02 DIAGNOSIS — N813 Complete uterovaginal prolapse: Secondary | ICD-10-CM

## 2016-07-02 NOTE — Patient Instructions (Signed)
1. Return in 2 weeks for pessary maintenance

## 2016-07-02 NOTE — Progress Notes (Signed)
Chief complaint: 1. Pessary insertion-57 mm gel horn pessary  Patient presents for gel horn pessary 57 mm insertion. Previously #2 ring with support pessary was inadequate in maintaining control over prolapse. Patient will use Trimosan gel intravaginally once a week. She will return in 2 weeks for pessary maintenance. The #2 ring with support pessary is removed prior to insertion of the new gel horn pessary.  Brayton Mars, MD  Note: This dictation was prepared with Dragon dictation along with smaller phrase technology. Any transcriptional errors that result from this process are unintentional.

## 2016-07-16 ENCOUNTER — Encounter: Payer: Medicare Other | Admitting: Obstetrics and Gynecology

## 2016-07-17 ENCOUNTER — Encounter: Payer: Self-pay | Admitting: Obstetrics and Gynecology

## 2016-07-17 ENCOUNTER — Ambulatory Visit (INDEPENDENT_AMBULATORY_CARE_PROVIDER_SITE_OTHER): Payer: Medicare Other | Admitting: Obstetrics and Gynecology

## 2016-07-17 VITALS — BP 165/91 | HR 94 | Ht 60.0 in | Wt 101.0 lb

## 2016-07-17 DIAGNOSIS — N952 Postmenopausal atrophic vaginitis: Secondary | ICD-10-CM

## 2016-07-17 DIAGNOSIS — R339 Retention of urine, unspecified: Secondary | ICD-10-CM

## 2016-07-17 DIAGNOSIS — Z4689 Encounter for fitting and adjustment of other specified devices: Secondary | ICD-10-CM | POA: Diagnosis not present

## 2016-07-17 DIAGNOSIS — N813 Complete uterovaginal prolapse: Secondary | ICD-10-CM

## 2016-07-17 NOTE — Progress Notes (Signed)
Chief complaint: 1. Pessary maintenance 2. Uterine procidentia 3. Traction cystocele, third-degree 4. Mild rectocele, asymptomatic 5. Incomplete bladder emptying 6. Urge incontinence 7. Vaginal atrophy  Recent fitting of 57 mm Gelfoam pessary. Pessary has been in place for 2 weeks. Bowel and bladder function are normal. She is not experiencing any slippage of the cystocele around the pessary. Only complaint is persistent feeling of the stem of the pessary when wiping. No vaginal discharge or bleeding. No pelvic pain.   OBJECTIVE: BP (!) 165/91   Pulse 94   Ht 5' (1.524 m)   Wt 101 lb (45.8 kg)   BMI 19.73 kg/m  Patient pleasant well-appearing female External genitalia-normal BUS-normal Vagina-pessary is appropriately placed with the stem noted at the introitus in the suburethral position;  Uterus-normal Adnexa-nonpalpable and non tender  PROCEDURE: 57 mm Gelfoam pessary is removed, cleaned, reinserted  ASSESSMENT: 1. Procidentia with traction cystocele, third-degree 2. Mild rectocele 3. Vaginal atrophy 4. Gel horn pessary-57 mm functioning appropriately; questionable length of stem bothersome  PLAN: 1. Gel horn pessary is removed, cleaned, reinserted 2. Continue using Trimosan gel intravaginally once a week 3. Return in 1 month for pessary maintenance. 4. If patient desires short-stem gel horn pessary, she should contact the office 1 week prior to her next appointment so 1 can be ordered.  A total of 15 minutes were spent face-to-face with the patient during this encounter and over half of that time dealt with counseling and coordination of care.  Brayton Mars, MD  Note: This dictation was prepared with Dragon dictation along with smaller phrase technology. Any transcriptional errors that result from this process are unintentional.

## 2016-07-17 NOTE — Patient Instructions (Signed)
1. Return in 4 weeks for pessary maintenance 2. If you would like the shorter stem pessary, call 1 or 2 weeks before scheduled appointment so we may order it for you 3. Continue using Trimosan gel weekly

## 2016-07-23 ENCOUNTER — Telehealth: Payer: Self-pay | Admitting: Obstetrics and Gynecology

## 2016-07-23 NOTE — Telephone Encounter (Signed)
Pt aware short stem pessary ordered.

## 2016-07-23 NOTE — Telephone Encounter (Signed)
PT CALLED AND DR DE TOLD HER THAT IF SHE WANTED TO CHANGE HER PESSARY TO CALL IN AND LET YOU KNOW, AND SHE WOULD LIKE TO CHANGE IT.

## 2016-08-04 ENCOUNTER — Other Ambulatory Visit: Payer: Self-pay

## 2016-08-12 ENCOUNTER — Ambulatory Visit: Admit: 2016-08-12 | Payer: Self-pay | Admitting: Surgery

## 2016-08-12 SURGERY — EXCISION, GANGLION CYST, WRIST
Anesthesia: Choice | Laterality: Left

## 2016-08-14 ENCOUNTER — Encounter: Payer: Medicare Other | Admitting: Obstetrics and Gynecology

## 2016-08-26 ENCOUNTER — Ambulatory Visit (INDEPENDENT_AMBULATORY_CARE_PROVIDER_SITE_OTHER): Payer: Medicare Other | Admitting: Obstetrics and Gynecology

## 2016-08-26 ENCOUNTER — Encounter: Payer: Self-pay | Admitting: Obstetrics and Gynecology

## 2016-08-26 VITALS — BP 144/83 | HR 70 | Ht 60.0 in | Wt 99.7 lb

## 2016-08-26 DIAGNOSIS — R339 Retention of urine, unspecified: Secondary | ICD-10-CM

## 2016-08-26 DIAGNOSIS — N813 Complete uterovaginal prolapse: Secondary | ICD-10-CM

## 2016-08-26 NOTE — Patient Instructions (Signed)
1. Return in 6 weeks for pessary maintenance 

## 2016-08-26 NOTE — Progress Notes (Signed)
Chief complaint: Pessary insertion  Patient presents for a new pessary insertion. The 57 mm short stem Gellhorn pessary has arrived.  OBJECTIVE: BP (!) 144/83   Pulse 70   Ht 5' (1.524 m)   Wt 99 lb 11.2 oz (45.2 kg)   BMI 19.47 kg/m  Patient pleasant well-appearing female External genitalia-normal BUS-normal Vagina-pessary is appropriately placed with the stem noted at the introitus in the suburethral position;  Uterus-normal Adnexa-nonpalpable and non tender  PROCEDURE: 57 mm Gelfoam pessary is removed, cleaned, reinserted  ASSESSMENT: 1. Procidentia with traction cystocele, third-degree 2. Mild rectocele 3. Vaginal atrophy 4. Gel horn pessary-57 mm functioning appropriately; questionable length of stem  PLAN: 1. Longstem 57 mm Gellhorn pessary is removed 2. Short stem 57 mm Gellhorn pessary is inserted 3. Return in 6 weeks for pessary maintenance  Brayton Mars, MD  Note: This dictation was prepared with Dragon dictation along with smaller phrase technology. Any transcriptional errors that result from this process are unintentional.

## 2016-09-02 ENCOUNTER — Other Ambulatory Visit: Payer: Self-pay | Admitting: Internal Medicine

## 2016-09-02 DIAGNOSIS — Z1231 Encounter for screening mammogram for malignant neoplasm of breast: Secondary | ICD-10-CM

## 2016-09-29 ENCOUNTER — Ambulatory Visit
Admission: RE | Admit: 2016-09-29 | Discharge: 2016-09-29 | Disposition: A | Discharge status: Home / Self Care | Payer: Medicare Other | Source: Ambulatory Visit | Attending: Internal Medicine | Admitting: Internal Medicine

## 2016-09-29 DIAGNOSIS — Z1231 Encounter for screening mammogram for malignant neoplasm of breast: Secondary | ICD-10-CM | POA: Diagnosis not present

## 2016-10-07 ENCOUNTER — Encounter: Payer: Self-pay | Admitting: Obstetrics and Gynecology

## 2016-10-07 ENCOUNTER — Ambulatory Visit
Admission: RE | Admit: 2016-10-07 | Discharge: 2016-10-07 | Disposition: A | Discharge status: Home / Self Care | Payer: Self-pay | Source: Ambulatory Visit | Attending: *Deleted | Admitting: *Deleted

## 2016-10-07 ENCOUNTER — Ambulatory Visit (INDEPENDENT_AMBULATORY_CARE_PROVIDER_SITE_OTHER): Payer: Medicare Other | Admitting: Obstetrics and Gynecology

## 2016-10-07 ENCOUNTER — Other Ambulatory Visit: Payer: Self-pay | Admitting: *Deleted

## 2016-10-07 VITALS — BP 145/76 | HR 64 | Ht 60.0 in | Wt 103.0 lb

## 2016-10-07 DIAGNOSIS — R351 Nocturia: Secondary | ICD-10-CM | POA: Diagnosis not present

## 2016-10-07 DIAGNOSIS — Z9289 Personal history of other medical treatment: Secondary | ICD-10-CM

## 2016-10-07 DIAGNOSIS — Z4689 Encounter for fitting and adjustment of other specified devices: Secondary | ICD-10-CM

## 2016-10-07 DIAGNOSIS — N3289 Other specified disorders of bladder: Secondary | ICD-10-CM | POA: Diagnosis not present

## 2016-10-07 DIAGNOSIS — N813 Complete uterovaginal prolapse: Secondary | ICD-10-CM | POA: Diagnosis not present

## 2016-10-07 DIAGNOSIS — N3941 Urge incontinence: Secondary | ICD-10-CM

## 2016-10-07 NOTE — Progress Notes (Signed)
Chief complaint: 1. Pessary maintenance (last visit 08/26/2016 2. History of uterine procidentia, incomplete bladder emptying, and third-degree cystocele  Patient presents for 6 week follow-up. She reports no significant vaginal bleeding, vaginal discharge, vaginal odor, or pain. Bowel movements are normal. Bladder function is notable for frequency and nocturia 3. No UTI symptoms. Patient is using Trimosan gel intravaginal weekly.  OBJECTIVE: BP (!) 145/76   Pulse 64   Ht 5' (1.524 m)   Wt 103 lb (46.7 kg)   BMI 20.12 kg/m  Patient pleasant well-appearing female External genitalia-normal BUS-normal Vagina-pessary is appropriately placed with the stem noted at the introitus in the suburethral position; speculum exam is performed with visualization of the vaginal walls demonstrating no erythema, no ulceration, and no lesion. Not palpated Adnexa-nonpalpable and non tender Rectovaginal-normal external exam  PROCEDURE: 57 mm Gelfoam pessary is not removed  ASSESSMENT: 1. Procidentia with traction cystocele, third-degree 2. Mild rectocele, asymptomatic 3. Vaginal atrophy 4. Normal pessary maintenance 5. Minimal unstable bladder symptoms  PLAN: 1. Pessary not removed today; we'll defer until next appointment in 6 weeks 2. Continue Trimosan gel intravaginal once a week 3. Discussed management options for unstable bladder including Merbetriq and Botox injection of the bladder. Patient is not desiring any intervention at this time 4. Return in 6 weeks for follow-up  A total of 15 minutes were spent face-to-face with the patient during this encounter and over half of that time dealt with counseling and coordination of care.  Brayton Mars, MD   Note: This dictation was prepared with Dragon dictation along with smaller phrase technology. Any transcriptional errors that result from this process are unintentional.

## 2016-10-07 NOTE — Patient Instructions (Addendum)
1. Return in 6 weeks for pessary maintenance 2. We discussed the use of Botox for unstable bladder symptoms.

## 2016-11-18 ENCOUNTER — Encounter: Payer: Medicare Other | Admitting: Obstetrics and Gynecology

## 2016-11-19 ENCOUNTER — Encounter: Payer: Self-pay | Admitting: Obstetrics and Gynecology

## 2016-11-19 ENCOUNTER — Ambulatory Visit (INDEPENDENT_AMBULATORY_CARE_PROVIDER_SITE_OTHER): Payer: Medicare Other | Admitting: Obstetrics and Gynecology

## 2016-11-19 VITALS — BP 179/80 | HR 80 | Ht 60.0 in | Wt 103.2 lb

## 2016-11-19 DIAGNOSIS — N952 Postmenopausal atrophic vaginitis: Secondary | ICD-10-CM

## 2016-11-19 DIAGNOSIS — N813 Complete uterovaginal prolapse: Secondary | ICD-10-CM | POA: Diagnosis not present

## 2016-11-19 DIAGNOSIS — Z4689 Encounter for fitting and adjustment of other specified devices: Secondary | ICD-10-CM | POA: Diagnosis not present

## 2016-11-19 DIAGNOSIS — R339 Retention of urine, unspecified: Secondary | ICD-10-CM

## 2016-11-19 NOTE — Progress Notes (Signed)
Chief complaint: 1. Pessary maintenance (10/07/2016) 2. History of uterine procidentia, incomplete bladder emptying, and third-degree cystocele  Patient presents for 6 week follow-up. She reports no significant vaginal bleeding, vaginal discharge, vaginal odor, or pain. Bowel movements are normal. Bladder function is notable for frequency and nocturia 3. No UTI symptoms. Patient is using Trimosan gel intravaginal weekly. At last visit the pessary was not removed due to difficulty with removal and reinsertion.  OBJECTIVE: BP (!) 145/76   Pulse 64   Ht 5' (1.524 m)   Wt 103 lb (46.7 kg)   BMI 20.12 kg/m  Patient pleasant well-appearing female External genitalia-normal BUS-normal Vagina-atrophic changes are present; 1 cm left vaginal sidewall micro-tear with bleeding is noted (from removal process); no significant discharge or lesion is seen. Uterus-normal Adnexa-nonpalpable and non tender Rectovaginal-normal external exam  PROCEDURE: 57 mm Gelfoam pessary is  removed, cleaned, and NOT reinserted; the ring with support diaphragm pessary is inserted instead due to patient choice (removing and reinserting the gel horn pessary is met with significant discomfort)  ASSESSMENT: 1. Procidentia with traction cystocele, third-degree 2. Mild rectocele, asymptomatic 3. Vaginal atrophy 4. Normal pessary maintenance 5. Minimal unstable bladder symptoms 6. Small vaginal tear from pessary removal noted 7. Patient desires non-insertion of gel horn pessary; patient desires insertion of ring with support diaphragm pessary  PLAN: 1. Gel horn pessary is removed, cleaned, and does not reinserted. 2. Ring with support pessary is inserted 3. Continue Trimosan gel intravaginal once a week 4. Return in 6 weeks for follow-up 5. Patient may return as needed if she would like to have the gel horn pessary reinserted area she does understand that if this is done, then we may minimize the frequency of  pessary removal due to significant discomfort experienced by the patient.  A total of 15 minutes were spent face-to-face with the patient during this encounter and over half of that time dealt with counseling and coordination of care.  Brayton Mars, MD  Note: This dictation was prepared with Dragon dictation along with smaller phrase technology. Any transcriptional errors that result from this process are unintentional.

## 2016-11-19 NOTE — Patient Instructions (Signed)
1. Return in 6 weeks for pessary check 2. If the current pessary is ineffective, we will change out the pessary for the gel horn pessary

## 2016-12-31 ENCOUNTER — Ambulatory Visit (INDEPENDENT_AMBULATORY_CARE_PROVIDER_SITE_OTHER): Payer: Medicare Other | Admitting: Obstetrics and Gynecology

## 2016-12-31 ENCOUNTER — Encounter: Payer: Self-pay | Admitting: Obstetrics and Gynecology

## 2016-12-31 VITALS — BP 158/81 | HR 74 | Ht 60.0 in | Wt 102.3 lb

## 2016-12-31 DIAGNOSIS — N952 Postmenopausal atrophic vaginitis: Secondary | ICD-10-CM | POA: Diagnosis not present

## 2016-12-31 DIAGNOSIS — N813 Complete uterovaginal prolapse: Secondary | ICD-10-CM

## 2016-12-31 DIAGNOSIS — R339 Retention of urine, unspecified: Secondary | ICD-10-CM | POA: Diagnosis not present

## 2016-12-31 DIAGNOSIS — Z4689 Encounter for fitting and adjustment of other specified devices: Secondary | ICD-10-CM | POA: Diagnosis not present

## 2016-12-31 NOTE — Progress Notes (Signed)
Chief complaint: 1. Pessary maintenance (11/19/2013)-ring with support pessary 2. History of uterine procidentia,incomplete bladder emptying, and third-degree cystocele  Patient presents for 6 week follow-up. She reports no significant vaginal bleeding, vaginal discharge, vaginal odor, or pain. Bowel movements are normal. No UTI symptoms. Patient is using Trimosan gel intravaginal weekly.  she is uncertain if she is getting adequate gel into the vagina with the applicator.  Past medical history, past surgical history, problem list, medications, and allergies are reviewed  OBJECTIVE: BP (!) 158/81   Pulse 74   Ht 5' (1.524 m)   Wt 102 lb 4.8 oz (46.4 kg)   BMI 19.98 kg/m  Pleasant female in no acute distress. Alert and oriented. Pelvic exam: External genitalia-normal BUS-normal Vagina-atrophic changes are present; no significant discharge or lesion is seen. Uterus-normal Adnexa-nonpalpable and non tender Rectovaginal-normal external exam  PROCEDURE: Ring with support pessary is removed, cleaned, and reinserted  ASSESSMENT: 1. Procidentia with traction cystocele, third-degree 2. Mild rectocele, asymptomatic 3. Vaginal atrophy 4. Normal pessary maintenance 5. Minimal unstable bladder symptoms 6. Improved tolerance with ring with support pessary versus the gel horn pessary  PLAN: 1. Ring with support pessary is removed, cleaned, and reinserted 2. Continue Trimosan gel intravaginal once a week 3. Return in 8 weeks for follow-up  A total of 15 minutes were spent face-to-face with the patient during this encounter and over half of that time dealt with counseling and coordination of care.  Brayton Mars, MD  Note: This dictation was prepared with Dragon dictation along with smaller phrase technology. Any transcriptional errors that result from this process are unintentional.

## 2016-12-31 NOTE — Patient Instructions (Signed)
1. Continue using Trimosan gel intravaginal once or twice a week. May insert without applicator if desired 2. Return in 8 weeks for pessary maintenance

## 2017-01-06 ENCOUNTER — Ambulatory Visit: Payer: Self-pay | Admitting: Family Medicine

## 2017-01-26 ENCOUNTER — Encounter: Payer: Self-pay | Admitting: *Deleted

## 2017-02-03 ENCOUNTER — Ambulatory Visit: Payer: Medicare Other | Admitting: Registered Nurse

## 2017-02-03 ENCOUNTER — Ambulatory Visit
Admission: RE | Admit: 2017-02-03 | Discharge: 2017-02-03 | Disposition: A | Discharge status: Home / Self Care | Payer: Medicare Other | Source: Ambulatory Visit | Attending: Ophthalmology | Admitting: Ophthalmology

## 2017-02-03 ENCOUNTER — Encounter
Admission: RE | Disposition: A | Discharge status: Home / Self Care | Payer: Self-pay | Source: Ambulatory Visit | Attending: Ophthalmology

## 2017-02-03 ENCOUNTER — Encounter: Payer: Self-pay | Admitting: *Deleted

## 2017-02-03 DIAGNOSIS — E7800 Pure hypercholesterolemia, unspecified: Secondary | ICD-10-CM | POA: Insufficient documentation

## 2017-02-03 DIAGNOSIS — Z87891 Personal history of nicotine dependence: Secondary | ICD-10-CM | POA: Insufficient documentation

## 2017-02-03 DIAGNOSIS — J439 Emphysema, unspecified: Secondary | ICD-10-CM | POA: Insufficient documentation

## 2017-02-03 DIAGNOSIS — H2511 Age-related nuclear cataract, right eye: Secondary | ICD-10-CM | POA: Insufficient documentation

## 2017-02-03 HISTORY — PX: CATARACT EXTRACTION W/PHACO: SHX586

## 2017-02-03 HISTORY — DX: Malignant (primary) neoplasm, unspecified: C80.1

## 2017-02-03 HISTORY — DX: Dyspnea, unspecified: R06.00

## 2017-02-03 HISTORY — DX: Hypothyroidism, unspecified: E03.9

## 2017-02-03 SURGERY — PHACOEMULSIFICATION, CATARACT, WITH IOL INSERTION
Anesthesia: Monitor Anesthesia Care | Site: Eye | Laterality: Right | Wound class: Clean

## 2017-02-03 MED ORDER — MOXIFLOXACIN HCL 0.5 % OP SOLN
OPHTHALMIC | Status: AC
  Filled 2017-02-03: qty 3

## 2017-02-03 MED ORDER — MOXIFLOXACIN HCL 0.5 % OP SOLN: 1.0000 [drp] | OPHTHALMIC | Status: DC | PRN

## 2017-02-03 MED ORDER — EPINEPHRINE PF 1 MG/ML IJ SOLN
INTRAMUSCULAR | Status: DC | PRN
  Administered 2017-02-03: 09:00:00 via OPHTHALMIC

## 2017-02-03 MED ORDER — POVIDONE-IODINE 5 % OP SOLN
OPHTHALMIC | Status: DC | PRN
  Administered 2017-02-03: 1 via OPHTHALMIC

## 2017-02-03 MED ORDER — NA CHONDROIT SULF-NA HYALURON 40-17 MG/ML IO SOLN
INTRAOCULAR | Status: DC | PRN
  Administered 2017-02-03: 1 mL via INTRAOCULAR

## 2017-02-03 MED ORDER — ARMC OPHTHALMIC DILATING DROPS
OPHTHALMIC | Status: AC
  Administered 2017-02-03: 08:00:00
  Filled 2017-02-03: qty 0.4

## 2017-02-03 MED ORDER — MOXIFLOXACIN HCL 0.5 % OP SOLN
OPHTHALMIC | Status: DC | PRN
  Administered 2017-02-03: 0.2 mL via OPHTHALMIC

## 2017-02-03 MED ORDER — CARBACHOL 0.01 % IO SOLN
INTRAOCULAR | Status: DC | PRN
  Administered 2017-02-03: 0.5 mL via INTRAOCULAR

## 2017-02-03 MED ORDER — MIDAZOLAM HCL 2 MG/2ML IJ SOLN
INTRAMUSCULAR | Status: AC
  Filled 2017-02-03: qty 2

## 2017-02-03 MED ORDER — MIDAZOLAM HCL 2 MG/2ML IJ SOLN
INTRAMUSCULAR | Status: DC | PRN
  Administered 2017-02-03: 1 mg via INTRAVENOUS

## 2017-02-03 MED ORDER — ARMC OPHTHALMIC DILATING DROPS
1.0000 "application " | OPHTHALMIC | Status: AC
  Administered 2017-02-03 (×2): 1 via OPHTHALMIC

## 2017-02-03 MED ORDER — LIDOCAINE HCL (PF) 4 % IJ SOLN
INTRAOCULAR | Status: DC | PRN
  Administered 2017-02-03: 4 mL via OPHTHALMIC

## 2017-02-03 MED ORDER — FENTANYL CITRATE (PF) 100 MCG/2ML IJ SOLN
INTRAMUSCULAR | Status: AC
  Filled 2017-02-03: qty 2

## 2017-02-03 MED ORDER — SODIUM CHLORIDE 0.9 % IV SOLN
INTRAVENOUS | Status: DC
  Administered 2017-02-03: 09:00:00 via INTRAVENOUS

## 2017-02-03 MED ORDER — FENTANYL CITRATE (PF) 100 MCG/2ML IJ SOLN
INTRAMUSCULAR | Status: DC | PRN
  Administered 2017-02-03: 50 ug via INTRAVENOUS

## 2017-02-03 MED ORDER — EPHEDRINE SULFATE 50 MG/ML IJ SOLN
INTRAMUSCULAR | Status: DC | PRN
  Administered 2017-02-03 (×2): 2.5 mg via INTRAVENOUS

## 2017-02-03 SURGICAL SUPPLY — 18 items
CANNULA ANT/CHMB 27GA (MISCELLANEOUS) ×3 IMPLANT
GLOVE BIO SURGEON STRL SZ8 (GLOVE) ×3 IMPLANT
GLOVE BIOGEL M 6.5 STRL (GLOVE) ×3 IMPLANT
GLOVE SURG LX 8.0 MICRO (GLOVE) ×2
GLOVE SURG LX STRL 8.0 MICRO (GLOVE) ×1 IMPLANT
GOWN STRL REUS W/ TWL LRG LVL3 (GOWN DISPOSABLE) ×2 IMPLANT
GOWN STRL REUS W/TWL LRG LVL3 (GOWN DISPOSABLE) ×4
LABEL CATARACT MEDS ST (LABEL) ×3 IMPLANT
LENS IOL TECNIS ITEC 24.0 (Intraocular Lens) ×3 IMPLANT
PACK CATARACT (MISCELLANEOUS) ×3 IMPLANT
PACK CATARACT BRASINGTON LX (MISCELLANEOUS) ×3 IMPLANT
PACK EYE AFTER SURG (MISCELLANEOUS) ×3 IMPLANT
SOL BSS BAG (MISCELLANEOUS) ×3
SOLUTION BSS BAG (MISCELLANEOUS) ×1 IMPLANT
SYR 3ML LL SCALE MARK (SYRINGE) ×3 IMPLANT
SYR 5ML LL (SYRINGE) ×3 IMPLANT
WATER STERILE IRR 250ML POUR (IV SOLUTION) ×3 IMPLANT
WIPE NON LINTING 3.25X3.25 (MISCELLANEOUS) ×3 IMPLANT

## 2017-02-03 NOTE — Anesthesia Procedure Notes (Signed)
Date/Time: 02/03/2017 9:15 AM Performed by: Johnna Acosta Pre-anesthesia Checklist: Patient identified, Emergency Drugs available, Suction available, Patient being monitored and Timeout performed Patient Re-evaluated:Patient Re-evaluated prior to inductionOxygen Delivery Method: Nasal cannula

## 2017-02-03 NOTE — Op Note (Signed)
PREOPERATIVE DIAGNOSIS:  Nuclear sclerotic cataract of the right eye.   POSTOPERATIVE DIAGNOSIS:  NUCLEAR SCLEROTIC CATARACT RIGHT EYE   OPERATIVE PROCEDURE: Procedure(s): CATARACT EXTRACTION PHACO AND INTRAOCULAR LENS PLACEMENT (IOC)   SURGEON:  Birder Robson, MD.   ANESTHESIA:  Anesthesiologist: Piscitello, Precious Haws, MD CRNA: Johnna Acosta, CRNA; Hedda Slade, CRNA  1.      Managed anesthesia care. 2.      0.41ml of Shugarcaine was instilled in the eye following the paracentesis.   COMPLICATIONS:  None.   TECHNIQUE:   Stop and chop   DESCRIPTION OF PROCEDURE:  The patient was examined and consented in the preoperative holding area where the aforementioned topical anesthesia was applied to the right eye and then brought back to the Operating Room where the right eye was prepped and draped in the usual sterile ophthalmic fashion and a lid speculum was placed. A paracentesis was created with the side port blade and the anterior chamber was filled with viscoelastic. A near clear corneal incision was performed with the steel keratome. A continuous curvilinear capsulorrhexis was performed with a cystotome followed by the capsulorrhexis forceps. Hydrodissection and hydrodelineation were carried out with BSS on a blunt cannula. The lens was removed in a stop and chop  technique and the remaining cortical material was removed with the irrigation-aspiration handpiece. The capsular bag was inflated with viscoelastic and the Technis [id number]  lens was placed in the capsular bag without complication. The remaining viscoelastic was removed from the eye with the irrigation-aspiration handpiece. The wounds were hydrated. The anterior chamber was flushed with Miostat and the eye was inflated to physiologic pressure. 0.43ml of Vigamox was placed in the anterior chamber. The wounds were found to be water tight. The eye was dressed with Vigamox. The patient was given protective glasses to wear throughout the  day and a shield with which to sleep tonight. The patient was also given drops with which to begin a drop regimen today and will follow-up with me in one day.  Implant Name Type Inv. Item Serial No. Manufacturer Lot No. LRB No. Used  LENS IOL DIOP 24.0 - [id number] Intraocular Lens LENS IOL DIOP 24.0 [id number]   Right 1   Procedure(s) with comments: CATARACT EXTRACTION PHACO AND INTRAOCULAR LENS PLACEMENT (IOC) (Right) - [place] 00:57.2 AP% 18.5 CDE 10.58 Fluid pack lot # [id number]  Electronically signed: [place] 02/03/2017 9:31 AM

## 2017-02-03 NOTE — H&P (Signed)
All labs reviewed. Abnormal studies sent to patients PCP when indicated.  Previous H&P reviewed, patient examined, there are NO CHANGES.  Angela Mason LOUIS7/10/20189:03 AM

## 2017-02-03 NOTE — Anesthesia Preprocedure Evaluation (Signed)
Anesthesia Evaluation  Patient identified by MRN, date of birth, ID band Patient awake    Reviewed: Allergy & Precautions, H&P , NPO status , Patient's Chart, lab work & pertinent test results  Airway Mallampati: III  TM Distance: <3 FB Neck ROM: limited    Dental  (+) Poor Dentition, Chipped, Caps   Pulmonary shortness of breath and with exertion, COPD, former smoker,           Cardiovascular Exercise Tolerance: Good hypertension, (-) angina(-) Past MI and (-) DOE      Neuro/Psych negative neurological ROS  negative psych ROS   GI/Hepatic negative GI ROS, Neg liver ROS, neg GERD  ,  Endo/Other  Hypothyroidism   Renal/GU      Musculoskeletal   Abdominal   Peds  Hematology negative hematology ROS (+)   Anesthesia Other Findings Past Medical History: No date: Cancer ([place])     Comment: SKIN No date: COPD, mild ([facility]) No date: Dyspnea No date: HLD (hyperlipidemia) No date: HTN (hypertension) No date: Hypothyroidism No date: Macular degeneration, bilateral No date: Varicosities  Past Surgical History: 2015: MOHS SURGERY     Comment: nose 2012: SALIVARY GLAND SURGERY Left 1940: TONSILLECTOMY  BMI    Body Mass Index:  20.12 kg/m      Reproductive/Obstetrics negative OB ROS                             Anesthesia Physical Anesthesia Plan  ASA: III  Anesthesia Plan: MAC   Post-op Pain Management:    Induction:   PONV Risk Score and Plan:   Airway Management Planned:   Additional Equipment:   Intra-op Plan:   Post-operative Plan:   Informed Consent: I have reviewed the patients History and Physical, chart, labs and discussed the procedure including the risks, benefits and alternatives for the proposed anesthesia with the patient or authorized representative who has indicated his/her understanding and acceptance.     Plan Discussed with: Anesthesiologist, CRNA and  Surgeon  Anesthesia Plan Comments:         Anesthesia Quick Evaluation

## 2017-02-03 NOTE — Transfer of Care (Signed)
Immediate Anesthesia Transfer of Care Note  Patient: Angela Mason  Procedure(s) Performed: Procedure(s) with comments: CATARACT EXTRACTION PHACO AND INTRAOCULAR LENS PLACEMENT (IOC) (Right) - [place] 00:57.2 AP% 18.5 CDE 10.58 Fluid pack lot # [id number]  Patient Location: PACU  Anesthesia Type:MAC  Level of Consciousness: awake, alert  and oriented  Airway & Oxygen Therapy: Patient Spontanous Breathing  Post-op Assessment: Report given to RN and Post -op Vital signs reviewed and stable  Post vital signs: Reviewed and stable  Last Vitals:  Vitals:   02/03/17 0810 02/03/17 0933  BP: (!) 182/73 (!) 99/42  Pulse: 66 (!) 47  Resp: 16   Temp:  36.6 C    Last Pain:  Vitals:   02/03/17 0933  TempSrc: Oral         Complications: No apparent anesthesia complications

## 2017-02-03 NOTE — Anesthesia Postprocedure Evaluation (Signed)
Anesthesia Post Note  Patient: Angela Mason  Procedure(s) Performed: Procedure(s) (LRB): CATARACT EXTRACTION PHACO AND INTRAOCULAR LENS PLACEMENT (IOC) (Right)  Patient location during evaluation: PACU Anesthesia Type: MAC Level of consciousness: awake Pain management: pain level controlled Vital Signs Assessment: post-procedure vital signs reviewed and stable Respiratory status: spontaneous breathing Cardiovascular status: blood pressure returned to baseline Postop Assessment: no signs of nausea or vomiting Anesthetic complications: no     Last Vitals:  Vitals:   02/03/17 0810 02/03/17 0933  BP: (!) 182/73 (!) 99/42  Pulse: 66 (!) 47  Resp: 16   Temp:  36.6 C    Last Pain:  Vitals:   02/03/17 0933  TempSrc: Oral                 Hedda Slade

## 2017-02-03 NOTE — Anesthesia Post-op Follow-up Note (Cosign Needed)
Anesthesia QCDR form completed.        

## 2017-02-03 NOTE — Discharge Instructions (Signed)

## 2017-02-19 ENCOUNTER — Encounter: Payer: Self-pay | Admitting: *Deleted

## 2017-02-24 ENCOUNTER — Encounter: Payer: Self-pay | Admitting: Anesthesiology

## 2017-02-24 ENCOUNTER — Ambulatory Visit: Payer: Medicare Other | Admitting: Anesthesiology

## 2017-02-24 ENCOUNTER — Encounter
Admission: RE | Disposition: A | Discharge status: Home / Self Care | Payer: Self-pay | Source: Ambulatory Visit | Attending: Ophthalmology

## 2017-02-24 ENCOUNTER — Ambulatory Visit
Admission: RE | Admit: 2017-02-24 | Discharge: 2017-02-24 | Disposition: A | Discharge status: Home / Self Care | Payer: Medicare Other | Source: Ambulatory Visit | Attending: Ophthalmology | Admitting: Ophthalmology

## 2017-02-24 DIAGNOSIS — Z79899 Other long term (current) drug therapy: Secondary | ICD-10-CM | POA: Diagnosis not present

## 2017-02-24 DIAGNOSIS — E7800 Pure hypercholesterolemia, unspecified: Secondary | ICD-10-CM | POA: Insufficient documentation

## 2017-02-24 DIAGNOSIS — I10 Essential (primary) hypertension: Secondary | ICD-10-CM | POA: Insufficient documentation

## 2017-02-24 DIAGNOSIS — H2512 Age-related nuclear cataract, left eye: Secondary | ICD-10-CM | POA: Diagnosis not present

## 2017-02-24 DIAGNOSIS — E039 Hypothyroidism, unspecified: Secondary | ICD-10-CM | POA: Insufficient documentation

## 2017-02-24 DIAGNOSIS — Z87891 Personal history of nicotine dependence: Secondary | ICD-10-CM | POA: Insufficient documentation

## 2017-02-24 HISTORY — DX: Edema, unspecified: R60.9

## 2017-02-24 HISTORY — PX: CATARACT EXTRACTION W/PHACO: SHX586

## 2017-02-24 SURGERY — PHACOEMULSIFICATION, CATARACT, WITH IOL INSERTION
Anesthesia: Monitor Anesthesia Care | Site: Eye | Laterality: Left | Wound class: Clean

## 2017-02-24 MED ORDER — FENTANYL CITRATE (PF) 100 MCG/2ML IJ SOLN
INTRAMUSCULAR | Status: AC
  Filled 2017-02-24: qty 2

## 2017-02-24 MED ORDER — NA CHONDROIT SULF-NA HYALURON 40-17 MG/ML IO SOLN
INTRAOCULAR | Status: AC
  Filled 2017-02-24: qty 1

## 2017-02-24 MED ORDER — CARBACHOL 0.01 % IO SOLN
INTRAOCULAR | Status: DC | PRN
  Administered 2017-02-24: .5 mL via INTRAOCULAR

## 2017-02-24 MED ORDER — EPINEPHRINE PF 1 MG/ML IJ SOLN
INTRAMUSCULAR | Status: AC
  Filled 2017-02-24: qty 1

## 2017-02-24 MED ORDER — EPINEPHRINE PF 1 MG/ML IJ SOLN
INTRAMUSCULAR | Status: DC | PRN
  Administered 2017-02-24: 1 mL via OPHTHALMIC

## 2017-02-24 MED ORDER — NA CHONDROIT SULF-NA HYALURON 40-17 MG/ML IO SOLN
INTRAOCULAR | Status: DC | PRN
  Administered 2017-02-24: 1 mL via INTRAOCULAR

## 2017-02-24 MED ORDER — MOXIFLOXACIN HCL 0.5 % OP SOLN
OPHTHALMIC | Status: DC | PRN
  Administered 2017-02-24: .2 mL via OPHTHALMIC

## 2017-02-24 MED ORDER — FENTANYL CITRATE (PF) 100 MCG/2ML IJ SOLN
INTRAMUSCULAR | Status: DC | PRN
  Administered 2017-02-24 (×2): 25 ug via INTRAVENOUS

## 2017-02-24 MED ORDER — POVIDONE-IODINE 5 % OP SOLN
OPHTHALMIC | Status: AC
  Filled 2017-02-24: qty 30

## 2017-02-24 MED ORDER — LIDOCAINE HCL (PF) 4 % IJ SOLN
INTRAOCULAR | Status: DC | PRN
  Administered 2017-02-24: 2 mL via OPHTHALMIC

## 2017-02-24 MED ORDER — SODIUM CHLORIDE 0.9 % IV SOLN
INTRAVENOUS | Status: DC
  Administered 2017-02-24: 11:00:00 via INTRAVENOUS

## 2017-02-24 MED ORDER — MOXIFLOXACIN HCL 0.5 % OP SOLN: 1.0000 [drp] | OPHTHALMIC | Status: DC | PRN

## 2017-02-24 MED ORDER — LIDOCAINE HCL (PF) 4 % IJ SOLN
INTRAMUSCULAR | Status: AC
  Filled 2017-02-24: qty 5

## 2017-02-24 MED ORDER — POVIDONE-IODINE 5 % OP SOLN
OPHTHALMIC | Status: DC | PRN
  Administered 2017-02-24: 1 via OPHTHALMIC

## 2017-02-24 MED ORDER — MOXIFLOXACIN HCL 0.5 % OP SOLN
OPHTHALMIC | Status: AC
  Filled 2017-02-24: qty 3

## 2017-02-24 MED ORDER — ARMC OPHTHALMIC DILATING DROPS
OPHTHALMIC | Status: AC
  Filled 2017-02-24: qty 0.4

## 2017-02-24 MED ORDER — ARMC OPHTHALMIC DILATING DROPS
1.0000 "application " | OPHTHALMIC | Status: AC
  Administered 2017-02-24 (×3): 1 via OPHTHALMIC

## 2017-02-24 SURGICAL SUPPLY — 16 items
GLOVE BIO SURGEON STRL SZ8 (GLOVE) ×3 IMPLANT
GLOVE BIOGEL M 6.5 STRL (GLOVE) ×3 IMPLANT
GLOVE SURG LX 8.0 MICRO (GLOVE) ×2
GLOVE SURG LX STRL 8.0 MICRO (GLOVE) ×1 IMPLANT
GOWN STRL REUS W/ TWL LRG LVL3 (GOWN DISPOSABLE) ×2 IMPLANT
GOWN STRL REUS W/TWL LRG LVL3 (GOWN DISPOSABLE) ×4
LABEL CATARACT MEDS ST (LABEL) ×3 IMPLANT
LENS IOL TECNIS ITEC 24.5 (Intraocular Lens) ×3 IMPLANT
PACK CATARACT (MISCELLANEOUS) ×3 IMPLANT
PACK CATARACT BRASINGTON LX (MISCELLANEOUS) ×3 IMPLANT
PACK EYE AFTER SURG (MISCELLANEOUS) ×3 IMPLANT
SOL BSS BAG (MISCELLANEOUS) ×3
SOLUTION BSS BAG (MISCELLANEOUS) ×1 IMPLANT
SYR 5ML LL (SYRINGE) ×3 IMPLANT
WATER STERILE IRR 250ML POUR (IV SOLUTION) ×3 IMPLANT
WIPE NON LINTING 3.25X3.25 (MISCELLANEOUS) ×3 IMPLANT

## 2017-02-24 NOTE — Transfer of Care (Signed)
Immediate Anesthesia Transfer of Care Note  Patient: Angela Mason  Procedure(s) Performed: Procedure(s) with comments: CATARACT EXTRACTION PHACO AND INTRAOCULAR LENS PLACEMENT (IOC) (Left) - [place] 01:12 AP% 25.0 CDE 18.03 Fluid pack lot # [id number]  Patient Location: PACU  Anesthesia Type:MAC  Level of Consciousness: awake, alert  and oriented  Airway & Oxygen Therapy: Patient Spontanous Breathing  Post-op Assessment: Report given to RN and Post -op Vital signs reviewed and stable  Post vital signs: Reviewed and stable  Last Vitals:  Vitals:   02/24/17 1024 02/24/17 1128  BP: (!) 168/67 (!) 147/61  Pulse: 74 (!) 58  Resp: 16 16  Temp: 36.4 C     Last Pain:  Vitals:   02/24/17 1128  TempSrc: Oral  PainSc: 0-No pain         Complications: No apparent anesthesia complications

## 2017-02-24 NOTE — Anesthesia Post-op Follow-up Note (Cosign Needed)
Anesthesia QCDR form completed.        

## 2017-02-24 NOTE — Discharge Instructions (Signed)
Eye Surgery Discharge Instructions  Expect mild scratchy sensation or mild soreness. DO NOT RUB YOUR EYE!  The day of surgery:  Minimal physical activity, but bed rest is not required  No reading, computer work, or close hand work  No bending, lifting, or straining.  May watch TV  For 24 hours:  No driving, legal decisions, or alcoholic beverages  Safety precautions  Eat anything you prefer: It is better to start with liquids, then soup then solid foods.  _____ Eye patch should be worn until postoperative exam tomorrow.  ____ Solar shield eyeglasses should be worn for comfort in the sunlight/patch while sleeping  Resume all regular medications including aspirin or Coumadin if these were discontinued prior to surgery. You may shower, bathe, shave, or wash your hair. Tylenol may be taken for mild discomfort.  Call your doctor if you experience significant pain, nausea, or vomiting, fever > 101 or other signs of infection. [phone] or [phone] Specific instructions:  Follow-up Information    Birder Robson, MD Follow up.   Specialty:  Ophthalmology Why:  August 1 at 10:35am Contact information: [address] [place] [address] [phone]

## 2017-02-24 NOTE — Anesthesia Postprocedure Evaluation (Signed)
Anesthesia Post Note  Patient: Angela Mason  Procedure(s) Performed: Procedure(s) (LRB): CATARACT EXTRACTION PHACO AND INTRAOCULAR LENS PLACEMENT (IOC) (Left)  Patient location during evaluation: PACU Anesthesia Type: MAC Level of consciousness: awake and alert Pain management: pain level controlled Vital Signs Assessment: post-procedure vital signs reviewed and stable Respiratory status: spontaneous breathing, nonlabored ventilation, respiratory function stable and patient connected to nasal cannula oxygen Cardiovascular status: stable and blood pressure returned to baseline Anesthetic complications: no     Last Vitals:  Vitals:   02/24/17 1024 02/24/17 1128  BP: (!) 168/67 (!) 147/61  Pulse: 74 (!) 58  Resp: 16 16  Temp: 36.4 C     Last Pain:  Vitals:   02/24/17 1128  TempSrc: Oral  PainSc: 0-No pain                 Darlyne Russian

## 2017-02-24 NOTE — Anesthesia Preprocedure Evaluation (Signed)
Anesthesia Evaluation  Patient identified by MRN, date of birth, ID band Patient awake    Reviewed: Allergy & Precautions, H&P , NPO status , Patient's Chart, lab work & pertinent test results  Airway Mallampati: III  TM Distance: <3 FB Neck ROM: limited    Dental  (+) Poor Dentition, Chipped, Caps   Pulmonary shortness of breath and with exertion, COPD, former smoker,           Cardiovascular Exercise Tolerance: Good hypertension, (-) angina(-) Past MI and (-) DOE      Neuro/Psych negative neurological ROS  negative psych ROS   GI/Hepatic negative GI ROS, Neg liver ROS, neg GERD  ,  Endo/Other  Hypothyroidism   Renal/GU      Musculoskeletal   Abdominal   Peds  Hematology negative hematology ROS (+)   Anesthesia Other Findings Past Medical History: No date: Cancer ([place])     Comment: SKIN No date: COPD, mild ([facility]) No date: Dyspnea No date: HLD (hyperlipidemia) No date: HTN (hypertension) No date: Hypothyroidism No date: Macular degeneration, bilateral No date: Varicosities  Past Surgical History: 2015: MOHS SURGERY     Comment: nose 2012: SALIVARY GLAND SURGERY Left 1940: TONSILLECTOMY  BMI    Body Mass Index:  20.12 kg/m      Reproductive/Obstetrics negative OB ROS                             Anesthesia Physical  Anesthesia Plan  ASA: III  Anesthesia Plan: MAC   Post-op Pain Management:    Induction:   PONV Risk Score and Plan:   Airway Management Planned:   Additional Equipment:   Intra-op Plan:   Post-operative Plan:   Informed Consent: I have reviewed the patients History and Physical, chart, labs and discussed the procedure including the risks, benefits and alternatives for the proposed anesthesia with the patient or authorized representative who has indicated his/her understanding and acceptance.     Plan Discussed with: Anesthesiologist, CRNA and  Surgeon  Anesthesia Plan Comments:         Anesthesia Quick Evaluation

## 2017-02-24 NOTE — H&P (Signed)
All labs reviewed. Abnormal studies sent to patients PCP when indicated.  Previous H&P reviewed, patient examined, there are NO CHANGES.  Angela Mason LOUIS7/31/201810:59 AM

## 2017-02-24 NOTE — Op Note (Signed)
PREOPERATIVE DIAGNOSIS:  Nuclear sclerotic cataract of the left eye.   POSTOPERATIVE DIAGNOSIS:  Nuclear sclerotic cataract of the left eye.   OPERATIVE PROCEDURE: Procedure(s): CATARACT EXTRACTION PHACO AND INTRAOCULAR LENS PLACEMENT (IOC)   SURGEON:  Birder Robson, MD.   ANESTHESIA:  Anesthesiologist: Piscitello, Precious Haws, MD CRNA: Darlyne Russian, CRNA  1.      Managed anesthesia care. 2.     0.70ml of Shugarcaine was instilled following the paracentesis   COMPLICATIONS:  None.   TECHNIQUE:   Stop and chop   DESCRIPTION OF PROCEDURE:  The patient was examined and consented in the preoperative holding area where the aforementioned topical anesthesia was applied to the left eye and then brought back to the Operating Room where the left eye was prepped and draped in the usual sterile ophthalmic fashion and a lid speculum was placed. A paracentesis was created with the side port blade and the anterior chamber was filled with viscoelastic. A near clear corneal incision was performed with the steel keratome. A continuous curvilinear capsulorrhexis was performed with a cystotome followed by the capsulorrhexis forceps. Hydrodissection and hydrodelineation were carried out with BSS on a blunt cannula. The lens was removed in a stop and chop  technique and the remaining cortical material was removed with the irrigation-aspiration handpiece. The capsular bag was inflated with viscoelastic and the Technis [id number] lens was placed in the capsular bag without complication. The remaining viscoelastic was removed from the eye with the irrigation-aspiration handpiece. The wounds were hydrated. The anterior chamber was flushed with Miostat and the eye was inflated to physiologic pressure. 0.65ml Vigamox was placed in the anterior chamber. The wounds were found to be water tight. The eye was dressed with Vigamox. The patient was given protective glasses to wear throughout the day and a shield with which to sleep  tonight. The patient was also given drops with which to begin a drop regimen today and will follow-up with me in one day.  Implant Name Type Inv. Item Serial No. Manufacturer Lot No. LRB No. Used  LENS IOL DIOP 24.5 - [id number] Intraocular Lens LENS IOL DIOP 24.5 [id number] AMO   Left 1    Procedure(s) with comments: CATARACT EXTRACTION PHACO AND INTRAOCULAR LENS PLACEMENT (IOC) (Left) - [place] 01:12 AP% 25.0 CDE 18.03 Fluid pack lot # [id number]  Electronically signed: [place] 02/24/2017 11:27 AM

## 2017-02-24 NOTE — Anesthesia Procedure Notes (Signed)
Procedure Name: MAC Date/Time: 02/24/2017 10:06 AM Performed by: Darlyne Russian Pre-anesthesia Checklist: Patient identified, Emergency Drugs available, Suction available, Patient being monitored and Timeout performed Patient Re-evaluated:Patient Re-evaluated prior to induction Oxygen Delivery Method: Nasal cannula Placement Confirmation: positive ETCO2

## 2017-03-09 ENCOUNTER — Telehealth: Payer: Self-pay | Admitting: Obstetrics and Gynecology

## 2017-03-09 NOTE — Telephone Encounter (Signed)
Pt aware I tried to contact her at 12:10. N/a. She states at that time she was in the shower and her husband was outside. Her pessary was 1/2 way out Friday. She pulled it out. Slight bleeding right after. She was ok for a few days. Now she is having a lot of pressure.  Appt made for tomorrow at 11:15.

## 2017-03-09 NOTE — Telephone Encounter (Signed)
Patient lvm stating that she can not wait until Wednesday 03/11/17 to be seen by Dr. Tennis Must. The patient stated that she is having bleeding and that her pessary needs to come out. The patient stated that she needs to speak with a nurse in regards to this issue. The patient did not disclose any other information. Please advise.

## 2017-03-09 NOTE — Telephone Encounter (Signed)
N/a.  Will try later.  

## 2017-03-09 NOTE — Telephone Encounter (Signed)
Patient called stating her pessary fell out. She would like a call back. Thanks

## 2017-03-10 ENCOUNTER — Encounter: Payer: Self-pay | Admitting: Obstetrics and Gynecology

## 2017-03-10 ENCOUNTER — Ambulatory Visit (INDEPENDENT_AMBULATORY_CARE_PROVIDER_SITE_OTHER): Payer: Medicare Other | Admitting: Obstetrics and Gynecology

## 2017-03-10 VITALS — BP 165/64 | HR 62 | Ht 60.0 in | Wt 100.2 lb

## 2017-03-10 DIAGNOSIS — Z4689 Encounter for fitting and adjustment of other specified devices: Secondary | ICD-10-CM

## 2017-03-10 DIAGNOSIS — N813 Complete uterovaginal prolapse: Secondary | ICD-10-CM | POA: Diagnosis not present

## 2017-03-10 DIAGNOSIS — N952 Postmenopausal atrophic vaginitis: Secondary | ICD-10-CM

## 2017-03-10 DIAGNOSIS — R339 Retention of urine, unspecified: Secondary | ICD-10-CM

## 2017-03-10 NOTE — Progress Notes (Signed)
Chief complaint: 1. Pessary maintenance 2. Requests pessary insertion  Patient presents for pessary maintenance. 5 days ago during a prolonged bowel movement, the pessary fell out. Patient has had progressively worsening symptoms with procidentia and would like to have the pessary inserted at this time. She was scheduled for regular pessary maintenance tomorrow.  Last visit was 12/31/2016 Patient reports no UTI symptoms. Bowel movements are normal. She has not been experiencing any significant vaginal bleeding, discharge, or odor.  Past medical history, past surgical history, problem list, medications, and allergies are reviewed  OBJECTIVE: BP (!) 165/64   Pulse 62   Ht 5' (1.524 m)   Wt 100 lb 3.2 oz (45.5 kg)   BMI 19.57 kg/m  Pelvic exam: External genitalia-normal BUS-normal Vagina-atrophic changes are present; no significant discharge or le  cervix-slight posterior erosion is noted at 6:00, minimally friable Uterus-normal Adnexa-nonpalpable and non tender Rectovaginal-normal external exam  PROCEDURE: The pessary is inserted today without difficulty  ASSESSMENT: 1. Procidentia with traction cystocele, third-degree 2. Mild rectocele, asymptomatic 3. Vaginal atrophy 4. Mild posterior cervix erosion, minimally friable likely due to expulsion of pessary and environmental irritants 5. Unstable bladder symptoms, minimal 6. Pessary is inserted today  PLAN: 1. Continue Trimosan gel intravaginally once a week 2. Return in 8 weeks for follow-up pessary maintenance  A total of 15 minutes were spent face-to-face with the patient during this encounter and over half of that time dealt with counseling and coordination of care.  Brayton Mars, MD  Note: This dictation was prepared with Dragon dictation along with smaller phrase technology. Any transcriptional errors that result from this process are unintentional.

## 2017-03-10 NOTE — Patient Instructions (Signed)
1. Pessary is inserted today 2. Return in 8 weeks for pessary maintenance 3. Continue using Trimosan gel intravaginally once a week

## 2017-03-11 ENCOUNTER — Encounter: Payer: Medicare Other | Admitting: Obstetrics and Gynecology

## 2017-05-05 ENCOUNTER — Ambulatory Visit (INDEPENDENT_AMBULATORY_CARE_PROVIDER_SITE_OTHER): Payer: Medicare Other | Admitting: Obstetrics and Gynecology

## 2017-05-05 ENCOUNTER — Encounter: Payer: Self-pay | Admitting: Obstetrics and Gynecology

## 2017-05-05 VITALS — BP 124/72 | HR 85 | Ht 60.0 in | Wt 99.7 lb

## 2017-05-05 DIAGNOSIS — Z4689 Encounter for fitting and adjustment of other specified devices: Secondary | ICD-10-CM | POA: Diagnosis not present

## 2017-05-05 DIAGNOSIS — N8111 Cystocele, midline: Secondary | ICD-10-CM

## 2017-05-05 DIAGNOSIS — N813 Complete uterovaginal prolapse: Secondary | ICD-10-CM

## 2017-05-05 DIAGNOSIS — N3289 Other specified disorders of bladder: Secondary | ICD-10-CM | POA: Diagnosis not present

## 2017-05-05 DIAGNOSIS — R339 Retention of urine, unspecified: Secondary | ICD-10-CM | POA: Diagnosis not present

## 2017-05-05 NOTE — Patient Instructions (Signed)
1. Pessary is removed, cleaned, and reinserted today 2. Continue using Trimosan gel intravaginally weekly 3. Return in 10 weeks for follow-up

## 2017-05-05 NOTE — Progress Notes (Signed)
Chief complaint: 1. Pessary maintenance   Last visit was 03/10/2017 Patient reports no UTI symptoms. Bowel movements are normal. She has not been experiencing any significant vaginal bleeding, discharge, or odor. She is using Trimosan gel intravaginally once a week. She is not using any estrogen cream.  Past medical history, past surgical history, problem list, medications, and allergies are reviewed  OBJECTIVE: BP 124/72   Pulse 85   Ht 5' (1.524 m)   Wt 99 lb 11.2 oz (45.2 kg)   BMI 19.47 kg/m  Pelvic exam: External genitalia-normal BUS-normal Vagina-atrophic changes are present; no significant discharge or lesions cervix-slight erythema is noted in the 10:00 position as well as 4 to 6:00 position posteriorly; no friability vaginal-normal external exam  PROCEDURE: Pessary is removed, cleaned, and reinserted  ASSESSMENT: 1. Procidentia with traction cystocele, third-degree 2. Mild rectocele, asymptomatic 3. Vaginal atrophy 4. Mild Cervix hyperemia without ulceration 5. Unstable bladder symptoms, minimal  PLAN: 1. Continue Trimosan gel intravaginally once a week 2. Return in 10 weeks for follow-up pessary maintenance  A total of 15 minutes were spent face-to-face with the patient during this encounter and over half of that time dealt with counseling and coordination of care.  Brayton Mars, MD  Note: This dictation was prepared with Dragon dictation along with smaller phrase technology. Any transcriptional errors that result from this process are unintentional.

## 2017-07-14 ENCOUNTER — Ambulatory Visit (INDEPENDENT_AMBULATORY_CARE_PROVIDER_SITE_OTHER): Payer: Medicare Other | Admitting: Obstetrics and Gynecology

## 2017-07-14 ENCOUNTER — Encounter: Payer: Medicare Other | Admitting: Obstetrics and Gynecology

## 2017-07-14 ENCOUNTER — Encounter: Payer: Self-pay | Admitting: Obstetrics and Gynecology

## 2017-07-14 VITALS — BP 180/89 | HR 66 | Wt 101.6 lb

## 2017-07-14 DIAGNOSIS — N952 Postmenopausal atrophic vaginitis: Secondary | ICD-10-CM

## 2017-07-14 DIAGNOSIS — N898 Other specified noninflammatory disorders of vagina: Secondary | ICD-10-CM | POA: Diagnosis not present

## 2017-07-14 DIAGNOSIS — N8111 Cystocele, midline: Secondary | ICD-10-CM | POA: Diagnosis not present

## 2017-07-14 DIAGNOSIS — N3289 Other specified disorders of bladder: Secondary | ICD-10-CM

## 2017-07-14 DIAGNOSIS — N813 Complete uterovaginal prolapse: Secondary | ICD-10-CM

## 2017-07-14 NOTE — Progress Notes (Signed)
Chief complaint: 1.  Pessary maintenance 2.  History of procidentia 3.  History of cystocele 4.  History of rectocele  Patient presents for 10-week pessary maintenance visit-last visit 05/05/2017 #3 ring with support diaphragm pessary. Patient reports no significant bladder symptoms.  Bowel movements are normal.  She is not experiencing any vaginal bleeding or discharge; however, she has noted a vaginal odor over the past couple weeks She is using Trimosan gel intravaginally once a week. She is currently not using any estrogen cream.  Past medical history, past surgical history, problem list, medications, and allergies are reviewed.  OBJECTIVE: BP (!) 180/89   Pulse 66   Wt 101 lb 9.6 oz (46.1 kg)   BMI 19.84 kg/m  Pleasant elderly female in no acute distress.  Alert and oriented. Abdomen: Soft, nontender without organomegaly Pelvic exam: External genitalia-normal BUS-normal Vagina-atrophic changes are present; no significant discharge or lesions cervix-slight erythema is noted in the 10:00 position as well as 4 to 6:00 position posteriorly; no friability; slight malodor is present Uterus-midplane, mobile, normal size and shape, nontender Adnexa-nonpalpable nontender Rectovaginal-normal external exam   PROCEDURE: #3 ring with support diaphragm pessary is removed, cleaned, and reinserted  ASSESSMENT: 1.  Procidentia with traction cystocele, third-degree 2.  Mild rectocele, asymptomatic 3.  Vaginal atrophy with slight erosion likely contributing to odor 4.  Mild cervix hyperemia without ulceration 5.  History of unstable bladder symptoms, minimally problematic  PLAN: 1.  Continue Trimosan gel intravaginally once a week 2.  Begin using Premarin cream intravaginal once a week for the next 6 weeks 3.  Once Premarin cream is finished, start using Trimosan gel intravaginally twice a week 4.  Return in 10 weeks for follow-up pessary maintenance  A total of 15 minutes were spent  face-to-face with the patient during this encounter and over half of that time dealt with counseling and coordination of care.  Brayton Mars, MD  Note: This dictation was prepared with Dragon dictation along with smaller phrase technology. Any transcriptional errors that result from this process are unintentional.

## 2017-07-14 NOTE — Patient Instructions (Signed)
1.  Insert Premarin cream 1/2-1 g intravaginal once a week until medication is finished 2.  Insert Trimosan gel intravaginal 1/2-1 g intravaginal once a week until the Premarin cream is used up; then insert Trimosan gel intravaginal twice a week 3.  Return in 10 weeks for pessary maintenance

## 2017-08-31 ENCOUNTER — Telehealth: Payer: Self-pay | Admitting: *Deleted

## 2017-08-31 ENCOUNTER — Other Ambulatory Visit: Payer: Self-pay | Admitting: Internal Medicine

## 2017-08-31 DIAGNOSIS — Z1231 Encounter for screening mammogram for malignant neoplasm of breast: Secondary | ICD-10-CM

## 2017-08-31 NOTE — Telephone Encounter (Addendum)
Pessary fell out 2 days ago. At this time she wants to leave it out. Slight erosion  noted on last exam. Advised pt to use premarin but she declines. Aware to d/c trimosan gel.  Pt to f/u with mad on 2/26.

## 2017-08-31 NOTE — Telephone Encounter (Signed)
Patient called and states that her pessary fell out and she wants to talk to the nurse about what she needs to do. I offered an appt but patient wants to discuss other options first. Patient is requesting a call back,. Her contact # [phone]. Please advise. Thank you

## 2017-09-01 ENCOUNTER — Ambulatory Visit (INDEPENDENT_AMBULATORY_CARE_PROVIDER_SITE_OTHER): Payer: Medicare Other | Admitting: Obstetrics and Gynecology

## 2017-09-01 ENCOUNTER — Encounter: Payer: Self-pay | Admitting: Obstetrics and Gynecology

## 2017-09-01 VITALS — BP 194/69 | HR 82 | Ht 60.0 in | Wt 102.1 lb

## 2017-09-01 DIAGNOSIS — R339 Retention of urine, unspecified: Secondary | ICD-10-CM | POA: Diagnosis not present

## 2017-09-01 DIAGNOSIS — Z4689 Encounter for fitting and adjustment of other specified devices: Secondary | ICD-10-CM | POA: Diagnosis not present

## 2017-09-01 DIAGNOSIS — I10 Essential (primary) hypertension: Secondary | ICD-10-CM

## 2017-09-01 NOTE — Patient Instructions (Signed)
1.  Continue using Trimosan gel intravaginal weekly 2.  Continue using Premarin cream intravaginal once a week 3.  Return in 6 weeks for follow-up pessary maintenance

## 2017-09-01 NOTE — Progress Notes (Signed)
Chief complaint: 1.  Pessary maintenance 2.  History of procidentia 3.  History of cystocele 4.  History of rectocele 5.  Pessary recently fell out  Patient presents for work in visit for evaluation of pelvic organ prolapse.  Her pessary is felt dislodged and actually was taken out by the patient because of it not feeling to be properly in place.  She was not sure if the displacement was due to constipation or if it was secondary to her attempts at inserting medications intravaginally. She reports no significant bladder symptoms.  Bowel movements are normal with occasional constipation.  She is not experiencing any significant vaginal bleeding or discharge; however, she has had some slight spotting. She is using Trimosan gel intravaginal once a week.  Past medical history, past surgical history, problem list, medications, and allergies are reviewed  OBJECTIVE: BP (!) 182/92   Pulse 93   Ht 5' (1.524 m)   Wt 102 lb 1.6 oz (46.3 kg)   BMI 19.94 kg/m  Pleasant female in no acute distress.  Alert and oriented. Abdomen: Soft, nontender Pelvic exam: External genitalia-normal BUS-normal Vagina-atrophic changes are present; no significant discharge or lesions cervix-slighterythema and granulation tissue is noted in the 4 to 6:00 position posteriorly; minimal friability; no malodor is present Uterus-midplane, mobile, normal size and shape, nontender Adnexa-nonpalpable nontender Rectovaginal-normal external exam  ASSESSMENT: 1.  Vaginal granulation tissue, minimally symptomatic 2.  Recent malpositioning of pessary with subsequent removal by patient 3.  Patient desires reinsertion  PLAN: 1.  Pessary is inserted today 2.  Patient is to return in 6 weeks for follow-up 3.  Patient is to continue using Trimosan gel intravaginal weekly 4.  Elevated blood pressure today without symptoms; recheck 194/62; patient is to maintain blood pressure diary and follow-up with Dr. Hall Busing in the near  future  A total of 15 minutes were spent face-to-face with the patient during this encounter and over half of that time dealt with counseling and coordination of care.  Brayton Mars, MD  Note: This dictation was prepared with Dragon dictation along with smaller phrase technology. Any transcriptional errors that result from this process are unintentional.

## 2017-09-22 ENCOUNTER — Encounter: Payer: Medicare Other | Admitting: Obstetrics and Gynecology

## 2017-10-01 ENCOUNTER — Ambulatory Visit
Admission: RE | Admit: 2017-10-01 | Discharge: 2017-10-01 | Disposition: A | Discharge status: Home / Self Care | Payer: Medicare Other | Source: Ambulatory Visit | Attending: Internal Medicine | Admitting: Internal Medicine

## 2017-10-01 DIAGNOSIS — Z1231 Encounter for screening mammogram for malignant neoplasm of breast: Secondary | ICD-10-CM | POA: Diagnosis not present

## 2017-10-13 ENCOUNTER — Ambulatory Visit (INDEPENDENT_AMBULATORY_CARE_PROVIDER_SITE_OTHER): Payer: Medicare Other | Admitting: Obstetrics and Gynecology

## 2017-10-13 ENCOUNTER — Encounter: Payer: Self-pay | Admitting: Obstetrics and Gynecology

## 2017-10-13 VITALS — BP 154/91 | HR 67 | Ht 60.0 in | Wt 103.6 lb

## 2017-10-13 DIAGNOSIS — N8111 Cystocele, midline: Secondary | ICD-10-CM

## 2017-10-13 DIAGNOSIS — N3289 Other specified disorders of bladder: Secondary | ICD-10-CM

## 2017-10-13 DIAGNOSIS — N952 Postmenopausal atrophic vaginitis: Secondary | ICD-10-CM | POA: Diagnosis not present

## 2017-10-13 DIAGNOSIS — N813 Complete uterovaginal prolapse: Secondary | ICD-10-CM | POA: Diagnosis not present

## 2017-10-13 NOTE — Progress Notes (Signed)
Chief complaint: 1.  Pessary maintenance 2.  History of procidentia 3.  History of cystocele 4.  History of rectocele  6-week follow-up (last visit 09/01/2017) Using Trimosan gel intravaginal weekly. Using ring with diaphragm support pessary No vaginal bleeding, vaginal discharge, vaginal pain, or vaginal odor  Past medical history, past surgical history, problem list, medications, and allergies are reviewed  OBJECTIVE: BP (!) 154/91   Pulse 67   Ht 5' (1.524 m)   Wt 103 lb 9.6 oz (47 kg)   BMI 20.23 kg/m  Pleasant elderly female in no acute distress.  Alert and oriented. Abdomen: Soft, nontender without organomegaly Pelvic exam: External genitalia-normal BUS-normal Vagina-mild atrophic changes; no significant discharge; posterior cervix/vaginal hyperemia, minimal and nonfriable Cervix-normal; no cervical motion tenderness Uterus-plane, mobile, normal size and shape, nontender Adnexa-nonpalpable nontender Rectovaginal-normal external exam  PROCEDURE: Ring with diaphragm support pessary is removed, cleaned, and reinserted  ASSESSMENT: 1.  Pelvic organ prolapse (cystocele, rectocele, uterine procidentia), asymptomatic with pessary use 2.  Pessary maintenance normal 3.  History of hypertension  PLAN: 1.  Pessary is removed, cleaned, and reinserted 2.  Continue with Trimosan gel intravaginal weekly 3.  Return in 8 weeks for pessary maintenance  A total of 15 minutes were spent face-to-face with the patient during this encounter and over half of that time dealt with counseling and coordination of care.  Brayton Mars, MD  Note: This dictation was prepared with Dragon dictation along with smaller phrase technology. Any transcriptional errors that result from this process are unintentional.

## 2017-10-13 NOTE — Patient Instructions (Signed)
1. Return in 8 weeks for Pessary maintenance.

## 2017-12-09 ENCOUNTER — Ambulatory Visit (INDEPENDENT_AMBULATORY_CARE_PROVIDER_SITE_OTHER): Payer: Medicare Other | Admitting: Obstetrics and Gynecology

## 2017-12-09 ENCOUNTER — Encounter: Payer: Self-pay | Admitting: Obstetrics and Gynecology

## 2017-12-09 VITALS — BP 162/85 | HR 71 | Ht 60.0 in | Wt 100.4 lb

## 2017-12-09 DIAGNOSIS — N8111 Cystocele, midline: Secondary | ICD-10-CM

## 2017-12-09 DIAGNOSIS — N813 Complete uterovaginal prolapse: Secondary | ICD-10-CM

## 2017-12-09 NOTE — Patient Instructions (Signed)
1.  Return in 8 weeks for pessary maintenance

## 2017-12-11 NOTE — Progress Notes (Signed)
Chief complaint: 1. Pessary maintenance 2. History of procidentia 3. History of cystocele 4. History of rectocele  8-week follow-up (last visit 10/13/2017) Using Trimosan gel intravaginal weekly. Using ring with diaphragm support pessary No vaginal bleeding, vaginal discharge, vaginal pain, or vaginal odor  Past medical history, past surgical history, problem list, medications, and allergies are reviewed  OBJECTIVE: BP (!) 162/85   Pulse 71   Ht 5' (1.524 m)   Wt 100 lb 6.4 oz (45.5 kg)   BMI 19.61 kg/m  Pleasant elderly female in no acute distress.  Alert and oriented. Abdomen: Soft, nontender without organomegaly Pelvic exam: (Unchanged) External genitalia-normal BUS-normal Vagina-mild atrophic changes; no significant discharge; posterior cervix/vaginal hyperemia, minimal and nonfriable Cervix-normal; no cervical motion tenderness Uterus-plane, mobile, normal size and shape, nontender Adnexa-nonpalpable nontender Rectovaginal-normal external exam  PROCEDURE: Ring with diaphragm support pessary is removed, cleaned, and reinserted  ASSESSMENT: 1.  Pelvic organ prolapse (cystocele, rectocele, uterine procidentia), asymptomatic with pessary use 2.  Pessary maintenance normal 3.  History of hypertension  PLAN: 1.  Pessary is removed, cleaned, and reinserted 2.  Continue with Trimosan gel intravaginal weekly 3.  Return in 8 weeks for pessary maintenance  A total of 15 minutes were spent face-to-face with the patient during this encounter and over half of that time dealt with counseling and coordination of care.  Brayton Mars, MD  Note: This dictation was prepared with Dragon dictation along with smaller phrase technology. Any transcriptional errors that result from this process are unintentional.

## 2017-12-23 ENCOUNTER — Telehealth: Payer: Self-pay | Admitting: Obstetrics and Gynecology

## 2017-12-23 MED ORDER — OXYQUINOLONE SULFATE 0.025 % VA GEL: 1.0000 "application " | VAGINAL | 0 refills | Status: DC

## 2017-12-23 NOTE — Telephone Encounter (Signed)
The patient called and stated that she would like to speak to [place], Dr. Duard Brady nurse if possible today. Please advise.

## 2017-12-23 NOTE — Telephone Encounter (Signed)
Trimosan erx.

## 2018-02-03 ENCOUNTER — Encounter: Payer: Self-pay | Admitting: Obstetrics and Gynecology

## 2018-02-03 ENCOUNTER — Ambulatory Visit (INDEPENDENT_AMBULATORY_CARE_PROVIDER_SITE_OTHER): Payer: Medicare Other | Admitting: Obstetrics and Gynecology

## 2018-02-03 VITALS — BP 159/82 | HR 76 | Ht 60.0 in | Wt 99.8 lb

## 2018-02-03 DIAGNOSIS — N813 Complete uterovaginal prolapse: Secondary | ICD-10-CM | POA: Diagnosis not present

## 2018-02-03 DIAGNOSIS — I10 Essential (primary) hypertension: Secondary | ICD-10-CM | POA: Diagnosis not present

## 2018-02-03 DIAGNOSIS — Z4689 Encounter for fitting and adjustment of other specified devices: Secondary | ICD-10-CM | POA: Diagnosis not present

## 2018-02-03 DIAGNOSIS — N939 Abnormal uterine and vaginal bleeding, unspecified: Secondary | ICD-10-CM

## 2018-02-03 DIAGNOSIS — N8111 Cystocele, midline: Secondary | ICD-10-CM

## 2018-02-03 DIAGNOSIS — S30814A Abrasion of vagina and vulva, initial encounter: Secondary | ICD-10-CM

## 2018-02-03 NOTE — Patient Instructions (Addendum)
1.  Return in 8 weeks for pessary maintenance 2.  Continue with Trimosan gel intravaginal weekly.  Insert applicator approximately 1 inch, using approximately 1 inch of gel weekly.

## 2018-02-03 NOTE — Progress Notes (Signed)
Chief complaint: 1.  Pessary maintenance 2.  Procidentia 3.  Cystocele 4.  Rectocele  8-week pessary maintenance (last visit 12/09/2017) Ring with diaphragm support pessary. Using Trimosan gel intravaginal weekly. Patient reports no significant vaginal discharge, vaginal pain, or vaginal odor.  She has been experiencing slight vaginal spotting over the past week.  Past medical history: Past surgical history, problem list, medications, and allergies are reviewed  Review of systems: Comprehensive review of systems is negative except for that noted in the HPI  OBJECTIVE: BP (!) 159/82   Pulse 76   Ht 5' (1.524 m)   Wt 99 lb 12.8 oz (45.3 kg)   BMI 19.49 kg/m   Pleasant elderly female in no acute distress.  Alert and oriented.  Affect is appropriate. Abdomen: Soft, nontender without organomegaly Pelvic exam: External genitalia-normal BUS-normal Vagina-minimal atrophic changes with out any discharge; posterior cervical vaginal hyperemia/abrasion is noted x3, minimally friable.  No malodor Cervix- no cervical motion tenderness; small abrasion at the 5 o'clock position of the cervix Uterus-midplane, mobile, normal size shape, and nontender Adnexa-nonpalpable nontender Rectovaginal-normal external exam  PROCEDURE: Ring with diaphragm support pessary is removed, cleaned, and reinserted  ASSESSMENT: 1.  Normal pessary maintenance 2.  Pelvic organ prolapse, asymptomatic with pessary use (cystocele, rectocele, procidentia) 3.  History of hypertension, stable 4.  Posterior vaginal and cervical abrasion, possibly related to applicator use; no evidence of infection  PLAN: 1.  Pessary is removed, cleaned, and reinserted 2.  Return in 8 weeks for pessary maintenance 3.  Continue with Trimosan gel intravaginal weekly; instructions on inserting the applicator approximately 1 inch into the vagina is recommended in order to avoid trauma from applicator  A total of 15 minutes were spent  face-to-face with the patient during this encounter and over half of that time dealt with counseling and coordination of care.  Brayton Mars, MD  Note: This dictation was prepared with Dragon dictation along with smaller phrase technology. Any transcriptional errors that result from this process are unintentional.

## 2018-03-31 ENCOUNTER — Encounter: Payer: Self-pay | Admitting: Obstetrics and Gynecology

## 2018-03-31 ENCOUNTER — Ambulatory Visit (INDEPENDENT_AMBULATORY_CARE_PROVIDER_SITE_OTHER): Payer: Medicare Other | Admitting: Obstetrics and Gynecology

## 2018-03-31 VITALS — BP 163/87 | HR 79 | Ht 60.0 in | Wt 100.6 lb

## 2018-03-31 DIAGNOSIS — N813 Complete uterovaginal prolapse: Secondary | ICD-10-CM | POA: Diagnosis not present

## 2018-03-31 DIAGNOSIS — Z4689 Encounter for fitting and adjustment of other specified devices: Secondary | ICD-10-CM

## 2018-03-31 DIAGNOSIS — N8111 Cystocele, midline: Secondary | ICD-10-CM

## 2018-03-31 DIAGNOSIS — R339 Retention of urine, unspecified: Secondary | ICD-10-CM

## 2018-03-31 NOTE — Patient Instructions (Signed)
1.  Continue using Trimosan gel intravaginal weekly 2.  Return in 8 weeks for pessary maintenance

## 2018-03-31 NOTE — Progress Notes (Signed)
Chief complaint: 1.  Pessary maintenance 2.  Procidentia 3.  Cystocele 4.  Rectocele  6-week pessary maintenance (last visit 02/03/2018) Ring with diaphragm support pessary. Medications: Trimosan gel intravaginal weekly No significant vaginal discharge, vaginal bleeding, vaginal pain, or vaginal odor.  Past Medical History:  Diagnosis Date  . Cancer ([place])    SKIN  . COPD, mild ([place])   . Dyspnea   . Edema   . HLD (hyperlipidemia)   . HTN (hypertension)   . Hypothyroidism   . Macular degeneration, bilateral   . Varicosities    Past Surgical History:  Procedure Laterality Date  . CATARACT EXTRACTION W/PHACO Right 02/03/2017   Procedure: CATARACT EXTRACTION PHACO AND INTRAOCULAR LENS PLACEMENT (IOC);  Surgeon: Birder Robson, MD;  Location: [facility] ORS;  Service: Ophthalmology;  Laterality: Right;  [place] 00:57.2 AP% 18.5 CDE 10.58 Fluid pack lot # [id number]  . CATARACT EXTRACTION W/PHACO Left 02/24/2017   Procedure: CATARACT EXTRACTION PHACO AND INTRAOCULAR LENS PLACEMENT (IOC);  Surgeon: Birder Robson, MD;  Location: [facility] ORS;  Service: Ophthalmology;  Laterality: Left;  [place] 01:12 AP% 25.0 CDE 18.03 Fluid pack lot # [id number]  . MOHS SURGERY  2015   nose  . SALIVARY GLAND SURGERY Left 2012  . TONSILLECTOMY  1940   Review of systems: Comprehensive review of systems is negative  OBJECTIVE: BP (!) 163/87   Pulse 79   Ht 5' (1.524 m)   Wt 100 lb 9.6 oz (45.6 kg)   BMI 19.65 kg/m  Pleasant female in no acute distress.  Alert and oriented.  Affect is appropriate. Back: No CVA tenderness Abdomen: Soft, nontender without organomegaly Pelvic exam: External genitalia-normal BUS-normal Vagina-atrophy present; no significant discharge; posterior cervical vaginal hyperemia/abrasion is noted x3, minimally friable (healing).  No malodor. Cervix-no cervical motion tenderness; 5 o'clock position small abrasion persists unchanged Uterus-midplane, mobile, nontender Adnexa-nonpalpable  nontender Rectovaginal-normal external exam  PROCEDURE: Ring with diaphragm support pessary is removed, cleaned, and reinserted  ASSESSMENT: 1.  Normal pessary maintenance 2.  Pelvic organ prolapse, asymptomatic with pessary use (cystocele, rectocele, procidentia) 3.  History of hypertension, stable 4.  Abrasions of vagina, healing   PLAN: 1.  Pessary is removed, cleaned, and reinserted. 2.  Return in 8 weeks for pessary maintenance 3.  Continue with Trimosan gel intravaginal weekly.  A total of 15 minutes were spent face-to-face with the patient during this encounter and over half of that time dealt with counseling and coordination of care.  Brayton Mars, MD  Note: This dictation was prepared with Dragon dictation along with smaller phrase technology. Any transcriptional errors that result from this process are unintentional.

## 2018-05-19 ENCOUNTER — Ambulatory Visit (INDEPENDENT_AMBULATORY_CARE_PROVIDER_SITE_OTHER): Payer: Medicare Other | Admitting: Obstetrics and Gynecology

## 2018-05-19 ENCOUNTER — Encounter: Payer: Self-pay | Admitting: Obstetrics and Gynecology

## 2018-05-19 VITALS — BP 196/90 | HR 68 | Ht 61.0 in | Wt 101.8 lb

## 2018-05-19 DIAGNOSIS — R300 Dysuria: Secondary | ICD-10-CM | POA: Diagnosis not present

## 2018-05-19 DIAGNOSIS — N8111 Cystocele, midline: Secondary | ICD-10-CM | POA: Diagnosis not present

## 2018-05-19 DIAGNOSIS — Z4689 Encounter for fitting and adjustment of other specified devices: Secondary | ICD-10-CM | POA: Diagnosis not present

## 2018-05-19 DIAGNOSIS — R339 Retention of urine, unspecified: Secondary | ICD-10-CM

## 2018-05-19 DIAGNOSIS — N813 Complete uterovaginal prolapse: Secondary | ICD-10-CM

## 2018-05-19 DIAGNOSIS — N3289 Other specified disorders of bladder: Secondary | ICD-10-CM

## 2018-05-19 DIAGNOSIS — S30814A Abrasion of vagina and vulva, initial encounter: Secondary | ICD-10-CM

## 2018-05-19 DIAGNOSIS — I10 Essential (primary) hypertension: Secondary | ICD-10-CM

## 2018-05-19 DIAGNOSIS — N952 Postmenopausal atrophic vaginitis: Secondary | ICD-10-CM

## 2018-05-19 LAB — POCT URINALYSIS DIPSTICK
BILIRUBIN UA: NEGATIVE
Blood, UA: NEGATIVE
GLUCOSE UA: NEGATIVE
Ketones, UA: NEGATIVE
Nitrite, UA: NEGATIVE
ODOR: NEGATIVE
PH UA: 7.5 (ref 5.0–8.0)
Protein, UA: NEGATIVE
Spec Grav, UA: <=1.005 — AB (ref 1.010–1.025)
UROBILINOGEN UA: 0.2 U/dL

## 2018-05-19 NOTE — Patient Instructions (Addendum)
1.  Continue with Trimosan gel intravaginal weekly 2.  Return in 10 weeks for follow-up pessary maintenance 3.  Urinalysis and urine culture is obtained today to rule out UTI 4.  Follow-up with Dr. Hall Busing for blood pressure check in 24 hours.  Also to get flu shot at that time.

## 2018-05-19 NOTE — Progress Notes (Signed)
Chief complaint: 1.  Pessary maintenance 2.  Dysuria 3.  Pelvic organ prolapse (cystocele, rectocele, procidentia) 4.  History of vaginal abrasion 5.  History of incomplete bladder emptying  Patient presents for 8-week pessary maintenance.  She is using a ring with diaphragm support pessary.  She does report chronic vaginal discharge, possibly related with some urine leakage.  She is emptying her bladder reasonably well.  Bowel function is normal.  She has noted some mild pink discharge, possibly blood coming from hemorrhoids (her thoughts).  She has no bright red blood per rectum.  She denies pelvic pain, vaginal odor, vaginal bleeding. She does have history of a posterior vaginal abrasion and pessary use; this has been chronic and has not had any evidence of superimposed infection over the last several visits.  Review of systems: No chest pain, no shortness of breath, no dyspnea on exertion, no edema, no headache, no visual changes  OBJECTIVE: BP (!) 196/90   Pulse 68   Ht 5\' 1"  (1.549 m)   Wt 101 lb 12.8 oz (46.2 kg)   BMI 19.23 kg/m   Pleasant elderly female in no acute distress.  Alert and oriented.  Affect is appropriate. Abdomen: Soft, nontender without organomegaly Bladder: Nontender Pelvic: External genitalia-normal BUS-normal Vagina-atrophic changes are present; minimal pink secretions present; posterior cervical vaginal hyperemia/abrasion is noted at the 5 through 6 through 7 o'clock position-Q-tip has mild pink discharge present with wiping.  No bright red bleeding. Cervix-normal; no cervical motion tenderness Uterus-midplane, mobile, nontender Adnexa-nonpalpable nontender Rectovaginal-normal external exam  PROCEDURE: Ring with diaphragm support is removed, cleaned, and reinserted  ASSESSMENT: 1.  Normal pessary maintenance 2.  Pelvic organ prolapse, asymptomatic with pessary in place for support of cystocele, rectocele, and uterine procidentia 3.  Posterior vaginal  wall abrasion, stable 4.  Dysuria, cannot rule a UTI 5.  Elevated blood pressure today without symptoms  PLAN: 1.  Pessary is removed, cleaned, and reinserted 2.  Patient is to continue Trimosan gel intravaginal weekly 3.  Patient is to follow-up with Dr. Hall Busing in 24 hours to get flu shot and have blood pressure rechecked 4.  Return in 10 weeks for pessary maintenance-Dr. Marcelline Mates  A total of 15 minutes were spent face-to-face with the patient during this encounter and over half of that time dealt with counseling and coordination of care.  Brayton Mars, MD  Note: This dictation was prepared with Dragon dictation along with smaller phrase technology. Any transcriptional errors that result from this process are unintentional.

## 2018-05-20 ENCOUNTER — Telehealth: Payer: Self-pay | Admitting: Obstetrics and Gynecology

## 2018-05-20 NOTE — Telephone Encounter (Signed)
Pt had a question about a few of her dx codes. Pt informed that initial encounter(vaginal abrasion) was not necessarily the first time being seen for the issue but that the problem was being followed. Encouraged pt to f/u with [facility] at [place].

## 2018-05-20 NOTE — Telephone Encounter (Signed)
The patient called and stated that she would like to speak with Joyice Faster in regards to some questions she has from her after visit summary she received at her last visit. The patient did not disclose any other information. Please advise.

## 2018-05-21 LAB — URINE CULTURE

## 2018-05-31 ENCOUNTER — Encounter: Payer: Self-pay | Admitting: Podiatry

## 2018-05-31 ENCOUNTER — Ambulatory Visit (INDEPENDENT_AMBULATORY_CARE_PROVIDER_SITE_OTHER): Payer: Medicare Other | Admitting: Podiatry

## 2018-05-31 ENCOUNTER — Telehealth: Payer: Self-pay

## 2018-05-31 DIAGNOSIS — M216X1 Other acquired deformities of right foot: Secondary | ICD-10-CM

## 2018-05-31 DIAGNOSIS — Q828 Other specified congenital malformations of skin: Secondary | ICD-10-CM | POA: Diagnosis not present

## 2018-05-31 MED ORDER — NITROFURANTOIN MONOHYD MACRO 100 MG PO CAPS: 100.0000 mg | ORAL_CAPSULE | Freq: Two times a day (BID) | ORAL | 0 refills | Status: DC

## 2018-05-31 NOTE — Progress Notes (Signed)
This patient presents with chief complaint of a painful callus on the bottom of her right forefoot.  She says it has been painful for only 3 months.  She says she has pain walking and wearing her shoes.  She does admit to exercising in her sneakers.  She has attempted to smooth the callus and to use acid but the problem persists.  She presents the office today for an evaluation and treatment of her painful callus right forefoot.  Vascular  Dorsalis pedis and posterior tibial pulses are palpable  B/L.  Capillary return  WNL.  Temperature gradient is  WNL.  Skin turgor  WNL  Sensorium  Senn Weinstein monofilament wire  WNL. Normal tactile sensation.  Nail Exam  Patient has normal nails with no evidence of bacterial or fungal infection.  Orthopedic  Exam  Muscle tone and muscle strength  WNL.  No limitations of motion feet  B/L.  No crepitus or joint effusion noted.  Foot type is unremarkable and digits show no abnormalities.  Hypermobile 1st MPJ right foot.  Plantarflexed second met right foot.  Skin  No open lesions.  Normal skin texture and turgor.  Porokeratosis sub 2 right foot.  Porokeratosis right foot.  Plantar flexed second metatarsal right  IE  Debride porokeratosis.  Discussed condition with this patient.  Padding dispensed.   Gardiner Barefoot DPM

## 2018-05-31 NOTE — Telephone Encounter (Signed)
-----   Message from Brayton Mars, MD sent at 05/31/2018  2:15 PM EST ----- Please notify - Abnormal Labs Please call in Macrobid twice daily for 7 days due to UTI from E. coli

## 2018-05-31 NOTE — Telephone Encounter (Signed)
Pt aware. Med erx. 

## 2018-06-01 ENCOUNTER — Telehealth: Payer: Self-pay | Admitting: Obstetrics and Gynecology

## 2018-06-01 MED ORDER — NITROFURANTOIN MONOHYD MACRO 100 MG PO CAPS: 100.0000 mg | ORAL_CAPSULE | Freq: Two times a day (BID) | ORAL | 0 refills | Status: DC

## 2018-06-01 NOTE — Telephone Encounter (Signed)
Noted  

## 2018-06-01 NOTE — Telephone Encounter (Signed)
The patient called and stated she had a message for CM>> Kristopher Oppenheim is cheaper.  *No action required*

## 2018-06-01 NOTE — Telephone Encounter (Signed)
Patient called requesting to change her pharmacy to [facility]. Thanks

## 2018-06-01 NOTE — Telephone Encounter (Signed)
Pt aware. Med erx to [facility].

## 2018-07-30 ENCOUNTER — Encounter: Payer: Medicare Other | Admitting: Obstetrics and Gynecology

## 2018-08-04 ENCOUNTER — Ambulatory Visit (INDEPENDENT_AMBULATORY_CARE_PROVIDER_SITE_OTHER): Payer: Medicare Other | Admitting: Obstetrics and Gynecology

## 2018-08-04 ENCOUNTER — Encounter: Payer: Self-pay | Admitting: Obstetrics and Gynecology

## 2018-08-04 VITALS — BP 162/78 | HR 69 | Ht 61.0 in | Wt 98.2 lb

## 2018-08-04 DIAGNOSIS — Z4689 Encounter for fitting and adjustment of other specified devices: Secondary | ICD-10-CM

## 2018-08-04 DIAGNOSIS — Z23 Encounter for immunization: Secondary | ICD-10-CM

## 2018-08-04 DIAGNOSIS — N813 Complete uterovaginal prolapse: Secondary | ICD-10-CM | POA: Diagnosis not present

## 2018-08-04 DIAGNOSIS — I10 Essential (primary) hypertension: Secondary | ICD-10-CM | POA: Diagnosis not present

## 2018-08-04 MED ORDER — TETANUS-DIPHTH-ACELL PERTUSSIS 5-2-15.5 LF-MCG/0.5 IM SUSP: 0.5000 mL | Freq: Once | INTRAMUSCULAR | 0 refills | Status: DC

## 2018-08-04 MED ORDER — TETANUS-DIPHTH-ACELL PERTUSSIS 5-2.5-18.5 LF-MCG/0.5 IM SUSP: 0.5000 mL | Freq: Once | INTRAMUSCULAR | Status: DC

## 2018-08-04 MED ORDER — TETANUS-DIPHTH-ACELL PERTUSSIS 5-2-15.5 LF-MCG/0.5 IM SUSP: 0.5000 mL | Freq: Once | INTRAMUSCULAR | 0 refills | Status: AC

## 2018-08-04 NOTE — Progress Notes (Signed)
Pt stated that the pessary is working but the last time MAD took it out it hurt really bad.

## 2018-08-04 NOTE — Progress Notes (Signed)
GYNECOLOGY CLINIC PROGRESS NOTE  Subjective:    [name], female    DOB: April 24, 1933, 85 y.o.   MRN: [id number]  HPI  Patient is a 85 y.o. G3P1001 female who presents for a pessary check. She has a history of pelvic organ prolapse (cystocele, rectocele, and complete procidentia) and incomplete bladder emptying. She reports no vaginal bleeding or discharge but does note an occasional pink tinge. She denies pelvic discomfort and difficulty urinating or moving her bowels. She is transitioning care from Dr. Enzo Bi who has retired.  ?  The following portions of the patient's history were reviewed and updated as appropriate:  She  has a past medical history of Cancer ([place]), COPD, mild ([place]), Dyspnea, Edema, HLD (hyperlipidemia), HTN (hypertension), Hypothyroidism, Macular degeneration, bilateral, and Varicosities. She  has a past surgical history that includes Tonsillectomy (1940); Mohs surgery (2015); Salivary gland surgery (Left, 2012); Cataract extraction w/PHACO (Right, 02/03/2017); and Cataract extraction w/PHACO (Left, 02/24/2017). Her family history includes COPD in her father; Diverticulitis in her mother; Epilepsy in her sister; Heart failure in her father; Macular degeneration in her mother; Parkinson's disease in her brother. She  reports that she quit smoking about 12 years ago. She has never used smokeless tobacco. She reports current alcohol use. She reports that she does not use drugs.    Current Outpatient Medications on File Prior to Visit  Medication Sig Dispense Refill  . atenolol (TENORMIN) 25 MG tablet Take 12.5 mg by mouth daily.     . calcium-vitamin D (OSCAL WITH D) 500-200 MG-UNIT tablet Take 1 tablet by mouth 2 (two) times daily.    . Coenzyme Q10 (CO Q 10 PO) Take 1 tablet by mouth daily.     Marland Kitchen lovastatin (MEVACOR) 20 MG tablet Take 20 mg by mouth at bedtime.    . Multiple Vitamins-Minerals (PRESERVISION AREDS PO) Take 1 tablet by mouth 2 (two)  times daily.     . Omega-3 Fatty Acids (FISH OIL CONCENTRATE PO) Take 1 tablet by mouth daily.     Levin Erp SULFATE VAGINAL (TRIMO-SAN) 0.025 % GEL Place 1 application vaginally once a week. 113.4 g 0  . tetrahydrozoline 0.05 % ophthalmic solution Place 1 drop into both eyes daily as needed (dry eyes).    . nitrofurantoin, macrocrystal-monohydrate, (MACROBID) 100 MG capsule Take 1 capsule (100 mg total) by mouth 2 (two) times daily. (Patient not taking: Reported on 08/04/2018) 14 capsule 0   No current facility-administered medications on file prior to visit.    She is allergic to gentamicin and levofloxacin..  Review of Systems Pertinent items noted in HPI and remainder of comprehensive ROS otherwise negative.   Objective:   Blood pressure (!) 162/78, pulse 69, height 5\' 1"  (1.549 m), weight 98 lb 3.2 oz (44.5 kg). General appearance: alert and no distress Abdomen: soft, non-tender; bowel sounds normal; no masses,  no organomegaly Pelvic: the patient's Size 3 ring with support pessary was removed, cleaned and replaced without complications. Speculum examination revealed normal vaginal mucosa with no lesions or lacerations.   Assessment:   Pelvic organ prolapse  History of incomplete bladder emptying HTN  Plan:   - The patient should return in 3 months for a pessary check and continue to use Trimo-san gel weekly as prescribed. - Elevated BP again today in setting of HTN.  Has seen her PCP recently who has adjusted her medications.  Notes that she thinks it may also be due to being anxious at her doctor  visits.  Notes normal BPs at home when relaxed. Currently asymptomatic.  - Reviewed patient's care gaps, currently in need of tetanus, pneumococcal and shingles vaccine. Has prescriptions for vaccinations except for Tdap.  Will prescribe. Patient can take to local pharmacy. Is up to date on flu vaccine.    Rubie Maid, MD [facility]

## 2018-08-23 ENCOUNTER — Other Ambulatory Visit: Payer: Self-pay | Admitting: Internal Medicine

## 2018-08-23 DIAGNOSIS — Z1231 Encounter for screening mammogram for malignant neoplasm of breast: Secondary | ICD-10-CM

## 2018-10-05 ENCOUNTER — Ambulatory Visit
Admission: RE | Admit: 2018-10-05 | Discharge: 2018-10-05 | Disposition: A | Discharge status: Home / Self Care | Payer: Medicare Other | Source: Ambulatory Visit | Attending: Internal Medicine | Admitting: Internal Medicine

## 2018-10-05 DIAGNOSIS — Z1231 Encounter for screening mammogram for malignant neoplasm of breast: Secondary | ICD-10-CM | POA: Diagnosis not present

## 2018-10-19 ENCOUNTER — Telehealth: Payer: Self-pay

## 2018-10-19 NOTE — Telephone Encounter (Signed)
Pt called to reschedule her appointment. Pt stated that she having some discharge and wanted to see the doctor. Pt was explained due to issues right now with the COVID-19 we were limited the amt of pt that come into the office. Pt rescheduled her appointment for the 13th of April.

## 2018-10-27 ENCOUNTER — Encounter: Payer: Medicare Other | Admitting: Obstetrics and Gynecology

## 2018-11-08 ENCOUNTER — Encounter: Payer: Self-pay | Admitting: Obstetrics and Gynecology

## 2019-01-10 ENCOUNTER — Telehealth: Payer: Self-pay

## 2019-01-10 NOTE — Progress Notes (Signed)
Pt is present today for pessary check. Pt stated that she was doing well no problems.

## 2019-01-10 NOTE — Telephone Encounter (Signed)
Pt prescreened no symptoms has face mask.   Coronavirus (COVID-19) Are you at risk?  Are you at risk for the Coronavirus (COVID-19)?  To be considered HIGH RISK for Coronavirus (COVID-19), you have to meet the following criteria:  . Traveled to [place], [place], [place], [place] or [place]; or in the [place] to [place], [place], [place], or [place]; and have fever, cough, and shortness of breath within the last 2 weeks of travel OR . Been in close contact with a person diagnosed with COVID-19 within the last 2 weeks and have fever, cough, and shortness of breath . IF YOU DO NOT MEET THESE CRITERIA, YOU ARE CONSIDERED LOW RISK FOR COVID-19.  What to do if you are HIGH RISK for COVID-19?  . If you are having a medical emergency, call 911. . Seek medical care right away. Before you go to a doctor's office, urgent care or emergency department, call ahead and tell them about your recent travel, contact with someone diagnosed with COVID-19, and your symptoms. You should receive instructions from your physician's office regarding next steps of care.  . When you arrive at healthcare provider, tell the healthcare staff immediately you have returned from visiting [place], [place], [place], [place] or [place]; or traveled in the [place] to [place], [place], [place], or [place]; in the last two weeks or you have been in close contact with a person diagnosed with COVID-19 in the last 2 weeks.   . Tell the health care staff about your symptoms: fever, cough and shortness of breath. . After you have been seen by a medical provider, you will be either: o Tested for (COVID-19) and discharged home on quarantine except to seek medical care if symptoms worsen, and asked to  - Stay home and avoid contact with others until you get your results (4-5 days)  - Avoid travel on public transportation if possible (such as bus, train, or airplane) or o Sent to the Emergency Department by EMS for  evaluation, COVID-19 testing, and possible admission depending on your condition and test results.  What to do if you are LOW RISK for COVID-19?  Reduce your risk of any infection by using the same precautions used for avoiding the common cold or flu:  . Wash your hands often with soap and warm water for at least 20 seconds.  If soap and water are not readily available, use an alcohol-based hand sanitizer with at least 60% alcohol.  . If coughing or sneezing, cover your mouth and nose by coughing or sneezing into the elbow areas of your shirt or coat, into a tissue or into your sleeve (not your hands). . Avoid shaking hands with others and consider head nods or verbal greetings only. . Avoid touching your eyes, nose, or mouth with unwashed hands.  . Avoid close contact with people who are sick. . Avoid places or events with large numbers of people in one location, like concerts or sporting events. . Carefully consider travel plans you have or are making. . If you are planning any travel outside or inside the [place], visit the CDC's Travelers' Health webpage for the latest health notices. . If you have some symptoms but not all symptoms, continue to monitor at home and seek medical attention if your symptoms worsen. . If you are having a medical emergency, call 911.   ADDITIONAL HEALTHCARE OPTIONS FOR PATIENTS  [facility] / e-Visit: [email or web address]           [facility]: [phone]  [facility]: [phone]                   [facility]: [phone]  

## 2019-01-11 ENCOUNTER — Other Ambulatory Visit: Payer: Self-pay

## 2019-01-11 ENCOUNTER — Encounter: Payer: Self-pay | Admitting: Obstetrics and Gynecology

## 2019-01-11 ENCOUNTER — Ambulatory Visit (INDEPENDENT_AMBULATORY_CARE_PROVIDER_SITE_OTHER): Payer: Medicare Other | Admitting: Obstetrics and Gynecology

## 2019-01-11 VITALS — BP 151/82 | HR 72 | Ht 61.0 in | Wt 100.6 lb

## 2019-01-11 DIAGNOSIS — N813 Complete uterovaginal prolapse: Secondary | ICD-10-CM

## 2019-01-11 DIAGNOSIS — Z4689 Encounter for fitting and adjustment of other specified devices: Secondary | ICD-10-CM

## 2019-01-11 DIAGNOSIS — I10 Essential (primary) hypertension: Secondary | ICD-10-CM

## 2019-01-11 NOTE — Progress Notes (Signed)
    GYNECOLOGY CLINIC PROGRESS NOTE  Subjective:    [name], female    DOB: October 01, 1932, 85 y.o.   MRN: [id number]  HPI  Patient is a 85 y.o. G35P1001 female who presents for a pessary check. She has a history of pelvic organ prolapse (cystocele, rectocele, and complete procidentia) and incomplete bladder emptying. She reports no vaginal bleeding but does note an increase in odor and discharge. Thinks it is because she had to stretch out her pessary check due to [place] pandemic. Denies any itching or burning. Is using her Trimo-San gel as prescribed. She denies pelvic discomfort and difficulty urinating or moving her bowels.  ?  The following portions of the patient's history were reviewed and updated as appropriate:  I have reviewed the patient's medical history in detail and updated the computerized patient record.  Review of Systems Pertinent items noted in HPI and remainder of comprehensive ROS otherwise negative.   Objective:   Blood pressure (!) 151/82, pulse 72, height 5\' 1"  (1.549 m), weight 100 lb 9.6 oz (45.6 kg). General appearance: alert and no distress Abdomen: soft, non-tender; bowel sounds normal; no masses,  no organomegaly Pelvic: the patient's Size 3 ring with support pessary was removed, cleaned and replaced without complications. Speculum examination revealed normal vaginal mucosa with no lesions or lacerations. Small amount of malodorous discharge present.    Assessment:   Pelvic organ prolapse  History of incomplete bladder emptying HTN  Plan:   - The patient should return in 3 months for a pessary check and continue to use Trimo-san gel weekly as prescribed. - Elevated BP again today in setting of HTN.  Notes normal BPs at home and at other office visits when relaxed. Has had BP meds recently adjusted by PCP. Currently asymptomatic.     Rubie Maid, MD [facility]

## 2019-04-13 ENCOUNTER — Other Ambulatory Visit: Payer: Self-pay

## 2019-04-13 ENCOUNTER — Encounter: Payer: Self-pay | Admitting: Obstetrics and Gynecology

## 2019-04-13 ENCOUNTER — Ambulatory Visit (INDEPENDENT_AMBULATORY_CARE_PROVIDER_SITE_OTHER): Payer: Medicare Other | Admitting: Obstetrics and Gynecology

## 2019-04-13 VITALS — BP 142/72 | HR 65 | Ht 61.0 in | Wt 98.5 lb

## 2019-04-13 DIAGNOSIS — N813 Complete uterovaginal prolapse: Secondary | ICD-10-CM | POA: Diagnosis not present

## 2019-04-13 DIAGNOSIS — R399 Unspecified symptoms and signs involving the genitourinary system: Secondary | ICD-10-CM | POA: Diagnosis not present

## 2019-04-13 DIAGNOSIS — R3121 Asymptomatic microscopic hematuria: Secondary | ICD-10-CM

## 2019-04-13 DIAGNOSIS — Z4689 Encounter for fitting and adjustment of other specified devices: Secondary | ICD-10-CM

## 2019-04-13 LAB — POCT URINALYSIS DIPSTICK
Bilirubin, UA: NEGATIVE
Glucose, UA: NEGATIVE
Ketones, UA: NEGATIVE
Nitrite, UA: NEGATIVE
Protein, UA: NEGATIVE
Spec Grav, UA: <=1.005 — AB (ref 1.010–1.025)
Urobilinogen, UA: 0.2 E.U./dL
pH, UA: 6 (ref 5.0–8.0)

## 2019-04-13 NOTE — Progress Notes (Signed)
Pt is present today for pessary check. Pt stated having smell and brown discharge. Pt denies any pain just the brown discharge and odor.

## 2019-04-13 NOTE — Progress Notes (Signed)
    GYNECOLOGY CLINIC PROGRESS NOTE  Subjective:    [name], female    DOB: Aug 28, 1932, 85 y.o.   MRN: [id number]  HPI  Patient is a 85 y.o. G48P1001 female who presents for a pessary check. She has a history of pelvic organ prolapse (cystocele, rectocele, and complete procidentia) and incomplete bladder emptying. She reports brown discharge and odor from the vagina with the pessary in.  Does also note some burning.  Denies any itching. Is using her Trimo-San gel as prescribed. She denies pelvic discomfort and difficulty urinating or moving her bowels.  ?  The following portions of the patient's history were reviewed and updated as appropriate:  I have reviewed the patient's medical history in detail and updated the computerized patient record.  Review of Systems Pertinent items noted in HPI and remainder of comprehensive ROS otherwise negative.   Objective:   Blood pressure (!) 142/72, pulse 65, height 5\' 1"  (1.549 m), weight 98 lb 8 oz (44.7 kg). General appearance: alert and no distress Abdomen: soft, non-tender; bowel sounds normal; no masses,  no organomegaly Pelvic: the patient's Size 3 ring with support pessary was removed, cleaned and replaced without complications. Speculum examination revealed vaginal mucosa with small abrasion on left vaginal wall.  No lesions or lacerations. Small amount of yellow-brown malodorous discharge present.     Labs:  Results for orders placed or performed in visit on 04/13/19  POCT urinalysis dipstick  Result Value Ref Range   Color, UA yellow    Clarity, UA clear    Glucose, UA Negative Negative   Bilirubin, UA neg    Ketones, UA neg    Spec Grav, UA <=1.005 (A) 1.010 - 1.025   Blood, UA hemo trace large +3    pH, UA 6.0 5.0 - 8.0   Protein, UA Negative Negative   Urobilinogen, UA 0.2 0.2 or 1.0 E.U./dL   Nitrite, UA neg    Leukocytes, UA Moderate (2+) (A) Negative   Appearance yellow    Odor      Assessment:    Pelvic organ prolapse  (cystocele, rectocele, and complete procidentia) History of incomplete bladder emptying Hematuria  Plan:   - The patient should return in 3 months for a pessary check and continue to use Trimo-san gel weekly as prescribed.  Patient declines use of Premarin cream to help with abrasion at this time.  Can attempt to teach patient self insertion and removal next visit if willing.  - Will check urine culture with UTI. Possibly vaginal source of blood but if positive, will prescribe antibiotics.     Rubie Maid, MD [facility]

## 2019-04-14 ENCOUNTER — Telehealth: Payer: Self-pay | Admitting: Obstetrics and Gynecology

## 2019-04-14 NOTE — Telephone Encounter (Signed)
The patient called and stated that she needs to speak with Dr. Andreas Blower nurse today if possible. Pt prefers call back. Please advise.

## 2019-04-14 NOTE — Addendum Note (Signed)
Addended by: Edwyna Shell on: 04/14/2019 03:56 PM   Modules accepted: Orders

## 2019-04-15 NOTE — Telephone Encounter (Signed)
Spoke with and she stated that she had some dry blood in her underwear after AC replaced her pessary.

## 2019-04-16 LAB — URINE CULTURE

## 2019-04-19 ENCOUNTER — Telehealth: Payer: Self-pay | Admitting: Obstetrics and Gynecology

## 2019-04-19 NOTE — Telephone Encounter (Signed)
The patient called and stated that she needs to speak with Dr. Andreas Blower nurse. Please advise.

## 2019-04-19 NOTE — Telephone Encounter (Signed)
Spoke with pt. Pt stated that the medication cost too much and she was unable to pay for it. Pt was advised that [facility] would look into prescribing another medication to help with her uti.

## 2019-04-20 MED ORDER — AMOXICILLIN-POT CLAVULANATE 500-125 MG PO TABS: 1.0000 | ORAL_TABLET | Freq: Three times a day (TID) | ORAL | 0 refills | Status: DC

## 2019-04-20 NOTE — Addendum Note (Signed)
Addended by: Edwyna Shell on: 04/20/2019 05:02 PM   Modules accepted: Orders

## 2019-04-20 NOTE — Telephone Encounter (Signed)
Spoke with pt and she is aware that [facility] sent in a prescription for Augmentin 500/125mg  to be token 3xs daily for 7 days. Pt is aware that the medication was sent to [facility] on [address].

## 2019-04-29 ENCOUNTER — Telehealth: Payer: Self-pay | Admitting: Obstetrics and Gynecology

## 2019-04-29 MED ORDER — TRIMO-SAN 0.025 % VA GEL: 1.0000 "application " | VAGINAL | 0 refills | Status: DC

## 2019-04-29 NOTE — Telephone Encounter (Signed)
Spoke with patient and let her know that we do not keep those samples in the office. She would like a new prescription sent to [facility]. I have sent in to pharmacy.

## 2019-04-29 NOTE — Telephone Encounter (Signed)
The patient called and stated that she is wanting to know if our office has samples/tubes of (Trino-san) The patient stated that when she previously saw Dr. Tennis Must she was given the cream in the office. Pt is requesting a call back. Please advise.

## 2019-07-13 ENCOUNTER — Other Ambulatory Visit: Payer: Self-pay

## 2019-07-13 ENCOUNTER — Encounter: Payer: Self-pay | Admitting: Obstetrics and Gynecology

## 2019-07-13 ENCOUNTER — Ambulatory Visit (INDEPENDENT_AMBULATORY_CARE_PROVIDER_SITE_OTHER): Payer: Medicare Other | Admitting: Obstetrics and Gynecology

## 2019-07-13 VITALS — BP 148/80 | HR 72 | Ht 61.0 in | Wt 97.9 lb

## 2019-07-13 DIAGNOSIS — Z4689 Encounter for fitting and adjustment of other specified devices: Secondary | ICD-10-CM

## 2019-07-13 DIAGNOSIS — N813 Complete uterovaginal prolapse: Secondary | ICD-10-CM | POA: Diagnosis not present

## 2019-07-13 DIAGNOSIS — N898 Other specified noninflammatory disorders of vagina: Secondary | ICD-10-CM

## 2019-07-13 NOTE — Progress Notes (Signed)
Pt is present for pessary check. Pt stated that she was doing well no problems.  

## 2019-07-13 NOTE — Progress Notes (Signed)
    GYNECOLOGY CLINIC PROGRESS NOTE  Subjective:    [name], female    DOB: [birth year], 85 y.o.   MRN: [id number]  HPI  Patient is a 85 y.o. G64P1001 female who presents for a pessary check. She has a history of cystocele, rectocele, complete procidentia,  and incomplete bladder emptying.    Denies any itching or burning. Does note vaginal odor. Is using her Trimo-San gel as prescribed. She denies pelvic discomfort and difficulty urinating or moving her bowels.  ?  The following portions of the patient's history were reviewed and updated as appropriate:  I have reviewed the patient's medical history in detail and updated the computerized patient record.  Review of Systems Pertinent items noted in HPI and remainder of comprehensive ROS otherwise negative.   Objective:   Blood pressure (!) 148/80, pulse 72, height 5\' 1"  (1.549 m), weight 97 lb 14.4 oz (44.4 kg). General appearance: alert and no distress Abdomen: soft, non-tender; bowel sounds normal; no masses,  no organomegaly Pelvic: the patient's Size 3 ring with support pessary was removed, cleaned and replaced without complications. Speculum examination revealed vaginal mucosa normal, no lesions or lacerations. Small amount of yellow-brown malodorous discharge present.   Assessment:   Pessary maintenance Pelvic organ prolapse  (cystocele, rectocele, and complete procidentia) Vaginal odor  Plan:   - Will shorten patient's interval for pessary checks back to 10 weeks to decrease vaginal odor. She can continue to use Trimo-san gel weekly as prescribed.      Angela Maid, MD [facility]

## 2019-08-29 ENCOUNTER — Telehealth: Payer: Self-pay | Admitting: Obstetrics and Gynecology

## 2019-08-29 ENCOUNTER — Other Ambulatory Visit: Payer: Self-pay | Admitting: Internal Medicine

## 2019-08-29 DIAGNOSIS — Z1231 Encounter for screening mammogram for malignant neoplasm of breast: Secondary | ICD-10-CM

## 2019-08-29 NOTE — Telephone Encounter (Signed)
Pt called no answer no vm picked up. If pt calls back please schedule her to see [facility] 08/30/2019.

## 2019-08-29 NOTE — Telephone Encounter (Signed)
Pt called and stated the she is bleeding its not heavy but more then normal. The pt didn't know if she needed to be seen sooner or is this normal. Pt is requesting aCall back . Please advise

## 2019-08-30 NOTE — Telephone Encounter (Signed)
Pt called and stated that she noticed the bleeding once but noticed that she is also constipated and not sure which area the blood come from. Pt stated that she has noticed it since then but is unsure.

## 2019-09-21 ENCOUNTER — Other Ambulatory Visit: Payer: Self-pay

## 2019-09-21 ENCOUNTER — Ambulatory Visit (INDEPENDENT_AMBULATORY_CARE_PROVIDER_SITE_OTHER): Payer: Medicare Other | Admitting: Obstetrics and Gynecology

## 2019-09-21 ENCOUNTER — Encounter: Payer: Self-pay | Admitting: Obstetrics and Gynecology

## 2019-09-21 VITALS — BP 104/74 | HR 80 | Ht 61.0 in | Wt 94.8 lb

## 2019-09-21 DIAGNOSIS — Z87440 Personal history of urinary (tract) infections: Secondary | ICD-10-CM

## 2019-09-21 DIAGNOSIS — N952 Postmenopausal atrophic vaginitis: Secondary | ICD-10-CM

## 2019-09-21 DIAGNOSIS — R399 Unspecified symptoms and signs involving the genitourinary system: Secondary | ICD-10-CM

## 2019-09-21 DIAGNOSIS — Z4689 Encounter for fitting and adjustment of other specified devices: Secondary | ICD-10-CM | POA: Diagnosis not present

## 2019-09-21 DIAGNOSIS — N939 Abnormal uterine and vaginal bleeding, unspecified: Secondary | ICD-10-CM

## 2019-09-21 DIAGNOSIS — N813 Complete uterovaginal prolapse: Secondary | ICD-10-CM | POA: Diagnosis not present

## 2019-09-21 DIAGNOSIS — R52 Pain, unspecified: Secondary | ICD-10-CM

## 2019-09-21 NOTE — Progress Notes (Signed)
Pt present for pessary check. Pt c/o vaginal bleeding(spotting) noticing each time she urinate and wipe.

## 2019-09-21 NOTE — Patient Instructions (Signed)
-   Use nickel sized amount of Premarin cream to vaginal introitus 1-2 times per week.  - Return urine sample at earliest convenience.

## 2019-09-21 NOTE — Progress Notes (Signed)
    GYNECOLOGY CLINIC PROGRESS NOTE  Subjective:    [name], female    DOB: [birth year], 86 y.o.   MRN: [id number]  HPI  Patient is a 86 y.o. G2P1001 female who presents for a pessary check. She has a history of cystocele, rectocele, complete procidentia,  and incomplete bladder emptying.    Denies any itching or burning. Is noting some vaginal spotting when wiping. Occasionally notes discomfort with urination.  Is using her Trimo-San gel as prescribed. She denies pelvic discomfort and difficulty urinating or moving her bowels.  ?  The following portions of the patient's history were reviewed and updated as appropriate:  I have reviewed the patient's medical history in detail and updated the computerized patient record.  Review of Systems Pertinent items noted in HPI and remainder of comprehensive ROS otherwise negative.   Objective:   Blood pressure 104/74, pulse 80, height 5\' 1"  (1.549 m), weight 94 lb 12.8 oz (43 kg). General appearance: alert and no distress Abdomen: soft, non-tender; bowel sounds normal; no masses,  no organomegaly Pelvic: the patient's Size 3 ring with support pessary was removed, cleaned and replaced without complications. Speculum examination revealed mildly atrophic vaginal mucosa, no lesions or lacerations. More significant atrophy noted at vaginal introitus. Scant amount of yellow-brown malodorous discharge present.   Assessment:   Pessary maintenance Pelvic organ prolapse  (cystocele, rectocele, and complete procidentia) Vaginal spotting Vaginal atrophy History of UTI  Plan:   - Discussed use of Premarin cream to help with vaginal atrophy.  Patient concerned about use and risk of cancer. Discussed risks vs benefits. Patient ok to use at introitus where worst area of atrophy is noted.  To use 1-2 times weekly. Can also continue with use of Trimosan gel for odor control.  - Patient also with h/o recurrent UTI's last year. Does  occasionally note urinary symptoms. Unable to void now, will give urine specimen cup to return at her earliest convenience.  - Return in 3 months for pessary check.    Rubie Maid, MD [facility]

## 2019-09-23 ENCOUNTER — Encounter: Payer: Medicare Other | Admitting: Obstetrics and Gynecology

## 2019-09-23 LAB — POCT URINALYSIS DIPSTICK
Bilirubin, UA: NEGATIVE
Blood, UA: NEGATIVE
Glucose, UA: NEGATIVE
Ketones, UA: NEGATIVE
Nitrite, UA: POSITIVE
Protein, UA: NEGATIVE
Spec Grav, UA: 1.02 (ref 1.010–1.025)
Urobilinogen, UA: 0.2 E.U./dL
pH, UA: 6 (ref 5.0–8.0)

## 2019-09-23 MED ORDER — NITROFURANTOIN MONOHYD MACRO 100 MG PO CAPS: 100.0000 mg | ORAL_CAPSULE | Freq: Two times a day (BID) | ORAL | 0 refills | Status: DC

## 2019-09-23 NOTE — Progress Notes (Signed)
It looks like patient has a UTI (unless she is taking azo). We can prescribe Macrobid x 7 days.

## 2019-09-23 NOTE — Addendum Note (Signed)
Addended by: Edwyna Shell on: 09/23/2019 11:55 AM   Modules accepted: Orders

## 2019-09-23 NOTE — Addendum Note (Signed)
Addended by: Augusto Gamble on: 09/23/2019 04:26 PM   Modules accepted: Orders

## 2019-09-28 LAB — URINE CULTURE

## 2019-10-06 ENCOUNTER — Other Ambulatory Visit: Payer: Self-pay | Admitting: Internal Medicine

## 2019-10-06 ENCOUNTER — Ambulatory Visit
Admission: RE | Admit: 2019-10-06 | Discharge: 2019-10-06 | Disposition: A | Discharge status: Home / Self Care | Payer: Medicare Other | Source: Ambulatory Visit | Attending: Internal Medicine | Admitting: Internal Medicine

## 2019-10-06 DIAGNOSIS — Z1231 Encounter for screening mammogram for malignant neoplasm of breast: Secondary | ICD-10-CM

## 2019-10-10 ENCOUNTER — Other Ambulatory Visit: Payer: Self-pay | Admitting: Internal Medicine

## 2019-10-10 DIAGNOSIS — Z1231 Encounter for screening mammogram for malignant neoplasm of breast: Secondary | ICD-10-CM

## 2019-10-10 DIAGNOSIS — N63 Unspecified lump in unspecified breast: Secondary | ICD-10-CM

## 2019-10-10 DIAGNOSIS — N6459 Other signs and symptoms in breast: Secondary | ICD-10-CM

## 2019-10-10 DIAGNOSIS — N6320 Unspecified lump in the left breast, unspecified quadrant: Secondary | ICD-10-CM

## 2019-10-18 ENCOUNTER — Ambulatory Visit
Admission: RE | Admit: 2019-10-18 | Discharge: 2019-10-18 | Disposition: A | Discharge status: Home / Self Care | Payer: Medicare Other | Source: Ambulatory Visit | Attending: Internal Medicine | Admitting: Internal Medicine

## 2019-10-18 DIAGNOSIS — N6320 Unspecified lump in the left breast, unspecified quadrant: Secondary | ICD-10-CM

## 2019-10-18 DIAGNOSIS — N6459 Other signs and symptoms in breast: Secondary | ICD-10-CM

## 2019-10-18 DIAGNOSIS — Z1231 Encounter for screening mammogram for malignant neoplasm of breast: Secondary | ICD-10-CM | POA: Diagnosis present

## 2019-10-19 ENCOUNTER — Telehealth: Payer: Self-pay | Admitting: Obstetrics and Gynecology

## 2019-10-19 NOTE — Telephone Encounter (Signed)
Please called pt and schedule her appointment to be seen by [facility].  Thanks [employer]

## 2019-10-19 NOTE — Telephone Encounter (Signed)
Patient called in saying shes having vaginal bleeding.

## 2019-10-19 NOTE — Telephone Encounter (Signed)
Patient was called and scheduled.

## 2019-10-20 NOTE — Patient Instructions (Signed)
Postmenopausal Bleeding  Postmenopausal bleeding is any bleeding that occurs after menopause. Menopause is when a woman's period stops. Any type of bleeding after menopause should be checked by your doctor. Treatment will depend on the cause. Follow these instructions at home:  Pay attention to any changes in your symptoms.  Avoid using tampons and douches as told by your doctor.  Change your pads regularly.  Get regular pelvic exams and Pap tests.  Take iron pills as told by your doctor.  Take over-the-counter and prescription medicines only as told by your doctor.  Keep all follow-up visits as told by your doctor. This is important. Contact a doctor if:  Your bleeding lasts for more than 1 week.  You have pain in your belly (abdomen).  You have bleeding during or after sex.  You have bleeding that happens more often than every 3 weeks. Get help right away if:  You have fever, chills, headache, dizziness, muscle aches, or bleeding.  You have very bad pain with bleeding.  You have clumps of blood (blood clots) coming from your vagina.  You have a lot of bleeding, and: ? You use more than 1 pad an hour. ? This kind of bleeding has never happened before.  You feel like you are going to pass out (faint). Summary  Any type of bleeding after menopause should be checked by your doctor.  Pay attention to any changes in your symptoms.  Keep all follow-up visits as told by your doctor. This information is not intended to replace advice given to you by your health care provider. Make sure you discuss any questions you have with your health care provider. Document Revised: 09/30/2018 Document Reviewed: 08/19/2016 Elsevier Patient Education  2020 Elsevier Inc.  

## 2019-10-20 NOTE — Progress Notes (Signed)
GYNECOLOGY PROGRESS NOTE  Subjective:    [name], female    DOB: September 07, 1932, 86 y.o.   MRN: [id number]  HPI  Patient is a 86 y.o. G49P1001 female who presents for complaints of an episode of postmenopausal bleeding. She notes an episode of bleeding x 1 day several days ago. Notes that she saw it on her urinary pad. Blood was dark red blood. Of note, patient was seen last month for pessary check (currently using ring pessary for cystocele, rectocele, complete uterine prolapse and incomplete bladder emptying)  She also was diagnosed with an E. Coli UTI at that visit, was treated with Macrobid, which she notes compliance with treatment. Reports using her Trimosan gel weekly, and tries to use the Premarin cream once or twice weekly, but not sure if she is getting enough far back into the vaginal vault.   Of note, patient does mention having issues with constipation over the past week or two. Has just initiated Metamucil. Also notes that she consumes 1 apple per day.   The following portions of the patient's history were reviewed and updated as appropriate: allergies, current medications, past family history, past medical history, past social history, past surgical history and problem list.  Review of Systems Pertinent items noted in HPI and remainder of comprehensive ROS otherwise negative.   Objective:   Blood pressure (!) 193/95, pulse 82, height 5\' 1"  (1.549 m), weight 91 lb 8 oz (41.5 kg). General appearance: alert and no distress Abdomen: soft, non-tender; bowel sounds normal; no masses,  no organomegaly Pelvic: the patient's Size 3 ring with support pessary was removed, cleaned and replaced without complications. Speculum examination revealed mildly atrophic vaginal mucosa, small area of abrasion on posterior vaginal wall.  No other lesions or lacerations. More significant atrophy noted at vaginal introitus. Moderate amount of yellow-brown discharge present.    Labs:    Results for orders placed or performed in visit on 10/21/19  POC Urinalysis Dipstick OB  Result Value Ref Range   Color, UA yellow    Clarity, UA cloudy    Glucose, UA Negative Negative   Bilirubin, UA neg    Ketones, UA neg    Spec Grav, UA 1.010 1.010 - 1.025   Blood, UA hemo large    pH, UA 7.0 5.0 - 8.0   POC,PROTEIN,UA Trace Negative, Trace, Small (1+), Moderate (2+), Large (3+), 4+   Urobilinogen, UA 0.2 0.2 or 1.0 E.U./dL   Nitrite, UA neg    Leukocytes, UA Moderate (2+) (A) Negative   Appearance     Odor      Assessment:   1. Vaginal erosion secondary to pessary use, initial encounter ([place])   2. Hematuria, unspecified type   3. Complete uterovaginal prolapse   4. History of UTI   5. Constipation, unspecified constipation type   6. Essential hypertension    Plan:   1. Pessary cleaned and reinserted today, using Premarin cream to help treat abrasion. Given new applicator with marker to inform patient how far applicator should be placed into the vagina.  2. Patient with hematuria (possibly artifact of vaginal spotting, or could be secondary to recent history of UTI).  Will repeat urine culture today to ensure adequate treatment. Currently denies UTI symptoms.  3. Patient with h/o HTN, significantly elevated BP today.  Notes compliance with medications. Asymptomatic today. May need to further f/u with PCP.  4. Patient recently initiated Metamucil for her constipation.  5.  RTC in 2  months for next pessary check, or sooner if symptoms persist.    Rubie Maid, MD [facility]

## 2019-10-21 ENCOUNTER — Ambulatory Visit (INDEPENDENT_AMBULATORY_CARE_PROVIDER_SITE_OTHER): Payer: Medicare Other | Admitting: Obstetrics and Gynecology

## 2019-10-21 ENCOUNTER — Encounter: Payer: Self-pay | Admitting: Obstetrics and Gynecology

## 2019-10-21 ENCOUNTER — Other Ambulatory Visit: Payer: Self-pay

## 2019-10-21 VITALS — BP 193/95 | HR 82 | Ht 61.0 in | Wt 91.5 lb

## 2019-10-21 DIAGNOSIS — R319 Hematuria, unspecified: Secondary | ICD-10-CM

## 2019-10-21 DIAGNOSIS — T8389XA Other specified complication of genitourinary prosthetic devices, implants and grafts, initial encounter: Secondary | ICD-10-CM | POA: Diagnosis not present

## 2019-10-21 DIAGNOSIS — I10 Essential (primary) hypertension: Secondary | ICD-10-CM

## 2019-10-21 DIAGNOSIS — N813 Complete uterovaginal prolapse: Secondary | ICD-10-CM

## 2019-10-21 DIAGNOSIS — N898 Other specified noninflammatory disorders of vagina: Secondary | ICD-10-CM

## 2019-10-21 DIAGNOSIS — K5900 Constipation, unspecified: Secondary | ICD-10-CM

## 2019-10-21 DIAGNOSIS — Z87440 Personal history of urinary (tract) infections: Secondary | ICD-10-CM

## 2019-10-21 LAB — POCT URINALYSIS DIPSTICK OB
Bilirubin, UA: NEGATIVE
Glucose, UA: NEGATIVE
Ketones, UA: NEGATIVE
Nitrite, UA: NEGATIVE
Spec Grav, UA: 1.01 (ref 1.010–1.025)
Urobilinogen, UA: 0.2 E.U./dL
pH, UA: 7 (ref 5.0–8.0)

## 2019-10-25 LAB — URINE CULTURE

## 2019-10-26 ENCOUNTER — Telehealth: Payer: Self-pay

## 2019-10-26 NOTE — Telephone Encounter (Signed)
Pt called to go over test results no answer LM via that [facility] stated her test results showed a small amount of bacteria that is normally noted on the skin and that there is no evidence of an UTI and to stop antibiotics. Pt was advised that if she had any questions or concerns to please contact the office.

## 2019-11-09 ENCOUNTER — Telehealth: Payer: Self-pay | Admitting: Obstetrics and Gynecology

## 2019-11-09 NOTE — Telephone Encounter (Signed)
Patient called stating her pessary came out and is needing advice.

## 2019-11-09 NOTE — Telephone Encounter (Signed)
Spoke with pt and she stated having a BM and her pessary fell.  Pt has an appointment scheduled for 11/16/2019.

## 2019-11-16 ENCOUNTER — Encounter: Payer: Self-pay | Admitting: Obstetrics and Gynecology

## 2019-11-16 ENCOUNTER — Ambulatory Visit (INDEPENDENT_AMBULATORY_CARE_PROVIDER_SITE_OTHER): Payer: Medicare Other | Admitting: Obstetrics and Gynecology

## 2019-11-16 ENCOUNTER — Other Ambulatory Visit: Payer: Self-pay

## 2019-11-16 VITALS — BP 135/80 | HR 76 | Ht 61.0 in | Wt 89.8 lb

## 2019-11-16 DIAGNOSIS — N813 Complete uterovaginal prolapse: Secondary | ICD-10-CM

## 2019-11-16 DIAGNOSIS — Z4689 Encounter for fitting and adjustment of other specified devices: Secondary | ICD-10-CM | POA: Diagnosis not present

## 2019-11-16 NOTE — Progress Notes (Signed)
    GYNECOLOGY PROGRESS NOTE  Subjective:    [name], female    DOB: Mar 03, 1933, 86 y.o.   MRN: [id number]  HPI  Patient is a 86 y.o. G47P1001 female who presents for complaints pessary expulsion.   Patient was seen last month for pessary check (currently using ring pessary for cystocele, rectocele, complete uterine prolapse and incomplete bladder emptying), also noted to have an area of vaginal erosion.      The following portions of the patient's history were reviewed and updated as appropriate: allergies, current medications, past family history, past medical history, past social history, past surgical history and problem list.  Review of Systems Pertinent items noted in HPI and remainder of comprehensive ROS otherwise negative.   Objective:   Blood pressure 135/80, pulse 76, height 5\' 1"  (1.549 m), weight 89 lb 12.8 oz (40.7 kg). General appearance: alert and no distress Abdomen: soft, non-tender; bowel sounds normal; no masses,  no organomegaly Pelvic: vaginal vault prolapse (likely cystocele slightly protruding beyond the vaginal hymen.    Assessment:   1. Pessary maintenance   2. 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    Plan:   1. Pessary reinserted today, continue using Premarin cream as directed. Can push next appointment out for 2 months.      Rubie Maid, MD [facility]

## 2019-11-16 NOTE — Progress Notes (Signed)
Pt present for pessary reinsertion. Pt called last week stating that her pessary fell out when she had a BM. Pt stated that she is unable to reinsert the pessary.

## 2019-12-13 ENCOUNTER — Encounter: Payer: Medicare Other | Admitting: Obstetrics and Gynecology

## 2020-01-11 ENCOUNTER — Encounter: Payer: Self-pay | Admitting: Obstetrics and Gynecology

## 2020-01-11 ENCOUNTER — Other Ambulatory Visit: Payer: Self-pay

## 2020-01-11 ENCOUNTER — Ambulatory Visit (INDEPENDENT_AMBULATORY_CARE_PROVIDER_SITE_OTHER): Payer: Medicare Other | Admitting: Obstetrics and Gynecology

## 2020-01-11 VITALS — BP 147/78 | HR 65 | Ht 58.5 in | Wt 90.3 lb

## 2020-01-11 DIAGNOSIS — R140 Abdominal distension (gaseous): Secondary | ICD-10-CM

## 2020-01-11 DIAGNOSIS — Z4689 Encounter for fitting and adjustment of other specified devices: Secondary | ICD-10-CM

## 2020-01-11 DIAGNOSIS — N813 Complete uterovaginal prolapse: Secondary | ICD-10-CM

## 2020-01-11 DIAGNOSIS — N952 Postmenopausal atrophic vaginitis: Secondary | ICD-10-CM

## 2020-01-11 DIAGNOSIS — R634 Abnormal weight loss: Secondary | ICD-10-CM | POA: Diagnosis not present

## 2020-01-11 NOTE — Patient Instructions (Signed)
Abdominal Bloating When you have abdominal bloating, your abdomen may feel full, tight, or painful. It may also look bigger than normal or swollen (distended). Common causes of abdominal bloating include:  Swallowing air.  Constipation.  Problems digesting food.  Eating too much.  Irritable bowel syndrome. This is a condition that affects the large intestine.  Lactose intolerance. This is an inability to digest lactose, a natural sugar in dairy products.  Celiac disease. This is a condition that affects the ability to digest gluten, a protein found in some grains.  Gastroparesis. This is a condition that slows down the movement of food in the stomach and small intestine. It is more common in people with diabetes mellitus.  Gastroesophageal reflux disease (GERD). This is a digestive condition that makes stomach acid flow back into the esophagus.  Urinary retention. This means that the body is holding onto urine, and the bladder cannot be emptied all the way. Follow these instructions at home: Eating and drinking  Avoid eating too much.  Try not to swallow air while talking or eating.  Avoid eating while lying down.  Avoid these foods and drinks: ? Foods that cause gas, such as broccoli, cabbage, cauliflower, and baked beans. ? Carbonated drinks. ? Hard candy. ? Chewing gum. Medicines  Take over-the-counter and prescription medicines only as told by your health care provider.  Take probiotic medicines. These medicines contain live bacteria or yeasts that can help digestion.  Take coated peppermint oil capsules. Activity  Try to exercise regularly. Exercise may help to relieve bloating that is caused by gas and relieve constipation. General instructions  Keep all follow-up visits as told by your health care provider. This is important. Contact a health care provider if:  You have nausea and vomiting.  You have diarrhea.  You have abdominal pain.  You have unusual  weight loss or weight gain.  You have severe pain, and medicines do not help. Get help right away if:  You have severe chest pain.  You have trouble breathing.  You have shortness of breath.  You have trouble urinating.  You have darker urine than normal.  You have blood in your stools or have dark, tarry stools. Summary  Abdominal bloating means that the abdomen is swollen.  Common causes of abdominal bloating are swallowing air, constipation, and problems digesting food.  Avoid eating too much and avoid swallowing air.  Avoid foods that cause gas, carbonated drinks, hard candy, and chewing gum. This information is not intended to replace advice given to you by your health care provider. Make sure you discuss any questions you have with your health care provider. Document Revised: 11/01/2018 Document Reviewed: 08/15/2016 Elsevier Patient Education  2020 Elsevier Inc.  

## 2020-01-11 NOTE — Progress Notes (Signed)
Pt present today for pessary check. Pt stated having vaginal burning. No other issues.

## 2020-01-11 NOTE — Progress Notes (Signed)
° ° °  GYNECOLOGY CLINIC PROGRESS NOTE  Subjective:    [name], female    DOB: 27-Oct-1932, 86 y.o.   MRN: [id number]  HPI  Patient is a 86 y.o. G25P1001 female who presents for a pessary check. She has a history of cystocele, rectocele, complete procidentia,  and incomplete bladder emptying.  Denies any itching or burning. She denies pelvic discomfort and difficulty urinating. She does report some occasional difficulty moving her bowels.  ? Of note, patient reports that she has been having some weight loss lately. Also reporting abdominal swelling (although this has been there for "some time").  Inquires as to signs and symptoms of cancer.   The following portions of the patient's history were reviewed and updated as appropriate:  I have reviewed the patient's medical history in detail and updated the computerized patient record.  Review of Systems Pertinent items noted in HPI and remainder of comprehensive ROS otherwise negative.   Objective:   Blood pressure (!) 147/78, pulse 65, height 4' 10.5" (1.486 m), weight 90 lb 4.8 oz (41 kg). General appearance: alert and no distress Abdomen: soft, non-tender.  Obvious distention of abdomen noted below the umbiicus, with soft mass palpable (possibly hernia or other soft tissue mass) below umbiilicus.  Pelvic: the patient's Size 3 ring with support pessary was removed, cleaned and replaced without complications. External exam appears normal, except moderate atrophy noted at vaginal introitus. Speculum examination revealed mildly atrophic vaginal mucosa, no lesions or lacerations.  Scant amount of yellow-brown malodorous discharge present. Bimanual exam notes normal size uterus, mobile, non-tender. Adnexae non-tender,non-palpable.   Assessment:   Pessary maintenance Pelvic organ prolapse  (cystocele, rectocele, and complete procidentia) Vaginal atrophy Abdominal distention Weight loss  Plan:   - Pessary cleaned and replaced  today. To f/u in 8-10 weeks for pessary check. Continue use of Premarin cream for vaginal atrophy (introital) and Trimo-San gel for pessary maintenance.  - Patient concerned about her abdand risk of cancer.  Abdomen is somewhat distended with soft tissue mass palpable up to level of umbilicus.  Unclear origin.  CT scan of abdomen ordered.  Will get Creatinine level for pre-procedure testing.     A total of 15 minutes were spent face-to-face with the patient during this encounter and over half of that time dealt with counseling and coordination of care.   Rubie Maid, MD [facility]

## 2020-01-12 LAB — CREATININE, SERUM
Creatinine, Ser: 0.71 mg/dL (ref 0.57–1.00)
GFR calc Af Amer: 90 mL/min/{1.73_m2} (ref >59)
GFR calc non Af Amer: 78 mL/min/{1.73_m2} (ref >59)

## 2020-01-26 ENCOUNTER — Other Ambulatory Visit: Payer: Self-pay

## 2020-01-26 ENCOUNTER — Ambulatory Visit
Admission: RE | Admit: 2020-01-26 | Discharge: 2020-01-26 | Disposition: A | Discharge status: Home / Self Care | Payer: Medicare Other | Source: Ambulatory Visit | Attending: Obstetrics and Gynecology | Admitting: Obstetrics and Gynecology

## 2020-01-26 ENCOUNTER — Ambulatory Visit: Payer: Medicare Other

## 2020-01-26 DIAGNOSIS — R140 Abdominal distension (gaseous): Secondary | ICD-10-CM | POA: Diagnosis not present

## 2020-01-26 DIAGNOSIS — R634 Abnormal weight loss: Secondary | ICD-10-CM | POA: Insufficient documentation

## 2020-01-26 MED ORDER — IOHEXOL 300 MG/ML  SOLN
75.0000 mL | Freq: Once | INTRAMUSCULAR | Status: AC | PRN
  Administered 2020-01-26: 75 mL via INTRAVENOUS

## 2020-02-01 ENCOUNTER — Telehealth: Payer: Self-pay

## 2020-02-01 NOTE — Telephone Encounter (Signed)
Pt called husband answered the phone pt is not available at this time. Pt's husband was asked to please have his wife (the pt) to call the office for test results.

## 2020-02-01 NOTE — Telephone Encounter (Signed)
Patient called in returning a phone call from nurse, please advise.

## 2020-02-02 NOTE — Telephone Encounter (Signed)
Please see result encounter notes. Pt is aware of test results.

## 2020-03-19 NOTE — Progress Notes (Signed)
Pt present for pessary check. Pt c/o of burning in the vaginal area but unsure were the burning is coming from. Pt also stated that she also would like her urine checked due to not knowing where the burning is coming from and freq urination.

## 2020-03-21 ENCOUNTER — Other Ambulatory Visit: Payer: Self-pay

## 2020-03-21 ENCOUNTER — Encounter: Payer: Self-pay | Admitting: Obstetrics and Gynecology

## 2020-03-21 ENCOUNTER — Ambulatory Visit (INDEPENDENT_AMBULATORY_CARE_PROVIDER_SITE_OTHER): Payer: Medicare Other | Admitting: Obstetrics and Gynecology

## 2020-03-21 VITALS — BP 116/61 | HR 82 | Ht 58.5 in | Wt 88.6 lb

## 2020-03-21 DIAGNOSIS — N939 Abnormal uterine and vaginal bleeding, unspecified: Secondary | ICD-10-CM

## 2020-03-21 DIAGNOSIS — N813 Complete uterovaginal prolapse: Secondary | ICD-10-CM

## 2020-03-21 DIAGNOSIS — Z4689 Encounter for fitting and adjustment of other specified devices: Secondary | ICD-10-CM | POA: Diagnosis not present

## 2020-03-21 DIAGNOSIS — N952 Postmenopausal atrophic vaginitis: Secondary | ICD-10-CM | POA: Diagnosis not present

## 2020-03-21 DIAGNOSIS — R399 Unspecified symptoms and signs involving the genitourinary system: Secondary | ICD-10-CM

## 2020-03-21 LAB — POCT URINALYSIS DIPSTICK
Bilirubin, UA: NEGATIVE
Glucose, UA: NEGATIVE
Ketones, UA: NEGATIVE
Nitrite, UA: NEGATIVE
Protein, UA: NEGATIVE
Spec Grav, UA: 1.02 (ref 1.010–1.025)
Urobilinogen, UA: 0.2 E.U./dL
pH, UA: 6 (ref 5.0–8.0)

## 2020-03-21 NOTE — Progress Notes (Signed)
    GYNECOLOGY PROGRESS NOTE  Subjective:    Patient ID: Margean Korell, female    DOB: 08/15/1932, 86 y.o.   MRN: [id number]  HPI  Patient is a 86 y.o. G9P1001 female who presents for pessary check. Patient is a 86 y.o. G22P1001 female who presents for a pessary check. She has a history of cystocele, rectocele, complete procidentia,  and incomplete bladder emptying. She reports some burning in the vaginal area, but is unsure of exact location. Also still noting some occasional vaginal spotting. She is using Trimo-san gel as prescribed, but notes that she stopped using her Premarin cream ~ 1 month ago (was using mostly externally). Desires urine to be checked today.   The following portions of the patient's history were reviewed and updated as appropriate: allergies, current medications, past family history, past medical history, past social history, past surgical history and problem list.  Review of Systems Pertinent items noted in HPI and remainder of comprehensive ROS otherwise negative.   Objective:   Blood pressure 116/61, pulse 82, height 4' 10.5" (1.486 m), weight 88 lb 9.6 oz (40.2 kg). General appearance: alert and no distress Abdomen: soft, non-tender; bowel sounds normal; no masses,  no organomegaly Pelvic: the patient's Size 3 ring with support pessary was removed, cleaned and replaced without complications. External exam appears normal, except moderate atrophy noted at vaginal introitus. Speculum examination revealed mild to moderate atrophic vaginal mucosa, no lesions.  Abrasion at posterior fourchette and on posterior vaginal wall.  Scant amount of yellow discharge present.   Labs:  Results for orders placed or performed in visit on 03/21/20  POCT urinalysis dipstick  Result Value Ref Range   Color, UA yellow    Clarity, UA clear    Glucose, UA Negative Negative   Bilirubin, UA neg    Ketones, UA neg    Spec Grav, UA 1.020 1.010 - 1.025   Blood, UA mod +2    pH, UA 6.0  5.0 - 8.0   Protein, UA Negative Negative   Urobilinogen, UA 0.2 0.2 or 1.0 E.U./dL   Nitrite, UA neg    Leukocytes, UA Small (1+) (A) Negative   Appearance yellow;clear    Odor       Assessment:  Pessary maintenance Pelvic organ prolapse  (cystocele, rectocele, and complete procidentia) Vaginal atrophy Vaginal spotting UTI symptoms  Plan:   - Pessary cleaned and replaced today. To f/u in 8-10 weeks for pessary check.  - Advised patient to resume Premarin cream use, to utilize internally with use of applicator and to apply small (dime-sized) amount to posterior fourchette 2-3 times weekly. Can hold on use of Trimo-San gel for now.  - UA today normal. No evidence of of UTI.    Rubie Maid, MD [facility]

## 2020-03-21 NOTE — Patient Instructions (Signed)
Atrophic Vaginitis Atrophic vaginitis is a condition in which the tissues that line the vagina become dry and thin. This condition occurs in women who have stopped having their period. It is caused by a drop in a female hormone (estrogen). This hormone helps:  To keep the vagina moist.  To make a clear fluid. This clear fluid helps: ? To make the vagina ready for sex. ? To protect the vagina from infection. If the lining of the vagina is dry and thin, it may cause irritation, burning, or itchiness. It may also:  Make sex painful.  Make an exam of your vagina painful.  Cause bleeding.  Make you lose interest in sex.  Cause a burning feeling when you pee (urinate).  Cause a brown or yellow fluid to come from your vagina. Some women do not have symptoms. Follow these instructions at home: Medicines  Take over-the-counter and prescription medicines only as told by your doctor.  Do not use herbs or other medicines unless your doctor says it is okay.  Use medicines for for dryness. These include: ? Oils to make the vagina soft. ? Creams. ? Moisturizers. General instructions  Do not douche.  Do not use products that can make your vagina dry. These include: ? Scented sprays. ? Scented tampons. ? Scented soaps.  Sex can help increase blood flow and soften the tissue in the vagina. If it hurts to have sex: ? Tell your partner. ? Use products to make sex more comfortable. Use these only as told by your doctor. Contact a doctor if you:  Have discharge from the vagina that is different than usual.  Have a bad smell coming from your vagina.  Have new symptoms.  Do not get better.  Get worse. Summary  Atrophic vaginitis is a condition in which the lining of the vagina becomes dry and thin.  This condition affects women who have stopped having their periods.  Treatment may include using products that help make the vagina soft.  Call a doctor if do not get better with  treatment. This information is not intended to replace advice given to you by your health care provider. Make sure you discuss any questions you have with your health care provider. Document Revised: 07/27/2017 Document Reviewed: 07/27/2017 Elsevier Patient Education  2020 [employer]  

## 2020-03-27 ENCOUNTER — Telehealth: Payer: Self-pay | Admitting: Obstetrics and Gynecology

## 2020-03-27 NOTE — Telephone Encounter (Signed)
Patient called in wanting to know if her provider had gotten results from her previous visit with [place]. Could you please advise?

## 2020-03-28 NOTE — Telephone Encounter (Signed)
Pt informed that her urine sample was negative.

## 2020-04-12 ENCOUNTER — Other Ambulatory Visit: Payer: Self-pay

## 2020-04-12 ENCOUNTER — Ambulatory Visit: Payer: Medicare Other

## 2020-04-12 DIAGNOSIS — R300 Dysuria: Secondary | ICD-10-CM

## 2020-04-12 DIAGNOSIS — R350 Frequency of micturition: Secondary | ICD-10-CM

## 2020-04-12 LAB — POCT URINALYSIS DIPSTICK
Bilirubin, UA: NEGATIVE
Blood, UA: NEGATIVE
Glucose, UA: NEGATIVE
Ketones, UA: NEGATIVE
Leukocytes, UA: NEGATIVE
Nitrite, UA: NEGATIVE
Protein, UA: NEGATIVE
Spec Grav, UA: 1.025 (ref 1.010–1.025)
Urobilinogen, UA: 0.2 E.U./dL
pH, UA: 5 (ref 5.0–8.0)

## 2020-04-12 NOTE — Progress Notes (Unsigned)
Patient walked into office to ask if she could leave a urine for culture. She has c/o frequency, urgency. Urine specimen dipped and sent for culture

## 2020-04-17 ENCOUNTER — Telehealth: Payer: Self-pay

## 2020-04-17 NOTE — Telephone Encounter (Signed)
This can sometimes happen with an infection.  It should resolve on it's own, but if it does not, she can come in to be evaluated.

## 2020-04-17 NOTE — Telephone Encounter (Signed)
Informed patient of Dr Dessie Coma orders concerning patients urine culture. Patient states she has pink discharge on toilet paper. Informed patient a message would be sent to Dr Marcelline Mates.

## 2020-04-17 NOTE — Telephone Encounter (Signed)
Voicemail message left for patient with Dr Dessie Coma instructions regard message about spotting.

## 2020-04-18 LAB — URINE CULTURE

## 2020-04-18 NOTE — Progress Notes (Signed)
So her urine culture returned with E. Coli (took some time to grow)  She will actually need an antibiotic. We can prescribe Macrobid.

## 2020-04-20 ENCOUNTER — Other Ambulatory Visit: Payer: Self-pay

## 2020-04-20 MED ORDER — NITROFURANTOIN MONOHYD MACRO 100 MG PO CAPS: 100.0000 mg | ORAL_CAPSULE | Freq: Two times a day (BID) | ORAL | 0 refills | Status: DC

## 2020-04-20 NOTE — Telephone Encounter (Signed)
Angela Mason called husband answered the phone and stated that his wife(Angela Mason) was currently not at home and out shopping with their daughter. Angela Mason's husband was informed that medication would be sent in to [facility] for his wife(Angela Mason) for UTI. Husband stated that he would inform his wife and gave me a cellphone number for his wife(Angela Mason) [phone]. Called the number given by husband no answer.

## 2020-05-01 NOTE — Progress Notes (Signed)
Pt present for pessary check. Pt stated that her pessary was doing well. Pt c/o of urinate burning, bleeding and pain. UA and urine culture completed.

## 2020-05-02 ENCOUNTER — Other Ambulatory Visit: Payer: Self-pay

## 2020-05-02 ENCOUNTER — Encounter: Payer: Self-pay | Admitting: Obstetrics and Gynecology

## 2020-05-02 ENCOUNTER — Ambulatory Visit (INDEPENDENT_AMBULATORY_CARE_PROVIDER_SITE_OTHER): Payer: Medicare Other | Admitting: Obstetrics and Gynecology

## 2020-05-02 VITALS — BP 120/74 | HR 84 | Ht 58.5 in | Wt 86.0 lb

## 2020-05-02 DIAGNOSIS — N813 Complete uterovaginal prolapse: Secondary | ICD-10-CM | POA: Diagnosis not present

## 2020-05-02 DIAGNOSIS — R399 Unspecified symptoms and signs involving the genitourinary system: Secondary | ICD-10-CM | POA: Diagnosis not present

## 2020-05-02 DIAGNOSIS — N952 Postmenopausal atrophic vaginitis: Secondary | ICD-10-CM

## 2020-05-02 DIAGNOSIS — N939 Abnormal uterine and vaginal bleeding, unspecified: Secondary | ICD-10-CM

## 2020-05-02 DIAGNOSIS — Z4689 Encounter for fitting and adjustment of other specified devices: Secondary | ICD-10-CM

## 2020-05-02 LAB — POCT URINALYSIS DIPSTICK
Bilirubin, UA: NEGATIVE
Glucose, UA: NEGATIVE
Ketones, UA: NEGATIVE
Nitrite, UA: NEGATIVE
Protein, UA: NEGATIVE
Spec Grav, UA: 1.01 (ref 1.010–1.025)
Urobilinogen, UA: 0.2 E.U./dL
pH, UA: 7 (ref 5.0–8.0)

## 2020-05-02 NOTE — Progress Notes (Signed)
    GYNECOLOGY PROGRESS NOTE  Subjective:    [name], female    DOB: [birth year], 86 y.o.   MRN: [id number]  HPI  Patient is a 86 y.o. G78P1001 female who presents for routine pessary check. She has a history of cystocele, rectocele, complete procidentia,and incomplete bladder emptying.  She reports complaints of continued urinary discomfort (burning) and vaginal spotting/light bleeding. Was recently treated for a UTI last month (E. Coli). Notes compliance with antibiotics.    The following portions of the patient's history were reviewed and updated as appropriate: allergies, current medications, past family history, past medical history, past social history, past surgical history and problem list.  Review of Systems Pertinent items noted in HPI and remainder of comprehensive ROS otherwise negative.   Objective:   Blood pressure 120/74, pulse 84, height 4' 10.5" (1.486 m), weight 86 lb (39 kg). General appearance: alert and no distress Abdomen: soft, non-tender; bowel sounds normal; no masses,  no organomegaly Pelvic: the patient'sSize 3 ring with support pessary was removed, cleaned and replaced without complications.External exam appears normal, except moderateatrophy noted at vaginal introitus. Speculum examination revealedmild to moderate atrophicvaginal mucosa,no lesions.  Abrasion at posterior fourchette and left periurethral.    Assessment:   Pessary maintenance Pelvic organ prolapse (cystocele, rectocele, and complete procidentia) Vaginal atrophy Vaginal spotting  Plan:   - Pessary cleaned and replaced today. To f/u in 10 weeks for pessary check.  - Advised patient to resume Premarin cream use (stopped almost 3 months ago, was encouraged to resume 2 months ago due to complaints of bleeding in the setting of her atrophy but notes she did not restart). To utilize internally with use of applicator and to apply small (dime-sized) amount to introitus. Can  hold on use of Trimo-San gel for now.  - UTI symptoms, has been using Azo.  Will order culture to ensure adequate treatment of UTI.    Rubie Maid, MD [facility]

## 2020-05-04 LAB — URINE CULTURE

## 2020-05-09 ENCOUNTER — Encounter: Payer: Medicare Other | Admitting: Obstetrics and Gynecology

## 2020-05-11 ENCOUNTER — Other Ambulatory Visit: Payer: Self-pay

## 2020-05-11 MED ORDER — AMPICILLIN 500 MG PO CAPS: 500.0000 mg | ORAL_CAPSULE | Freq: Four times a day (QID) | ORAL | 0 refills | Status: DC

## 2020-05-14 ENCOUNTER — Telehealth: Payer: Self-pay

## 2020-05-14 NOTE — Telephone Encounter (Signed)
Patient called in stating that she was supposed to have a Rx for an antibiotic called in however her pharmacy is saying they do not have it. Could you please advise?

## 2020-05-15 ENCOUNTER — Telehealth: Payer: Self-pay

## 2020-05-15 NOTE — Telephone Encounter (Signed)
Spoke to [employer] and was informed that their computer system was down on Friday and did not receive any medication requesting during that time. Did a verbal request over the phone. Pt is aware.

## 2020-05-15 NOTE — Telephone Encounter (Signed)
Please see another phone encounter to pharmacy.

## 2020-06-04 ENCOUNTER — Telehealth: Payer: Self-pay

## 2020-06-04 NOTE — Telephone Encounter (Signed)
Error pt moved up apt

## 2020-06-04 NOTE — Telephone Encounter (Signed)
Pt called

## 2020-06-11 NOTE — Progress Notes (Signed)
Pt present for pessary check.  Pt stated that she was doing well other than having uti symptoms. Pt is requesting a referral to an urologist. UA completed along with cultures.

## 2020-06-12 ENCOUNTER — Encounter: Payer: Self-pay | Admitting: Obstetrics and Gynecology

## 2020-06-12 ENCOUNTER — Ambulatory Visit (INDEPENDENT_AMBULATORY_CARE_PROVIDER_SITE_OTHER): Payer: Medicare Other | Admitting: Obstetrics and Gynecology

## 2020-06-12 ENCOUNTER — Other Ambulatory Visit: Payer: Self-pay

## 2020-06-12 VITALS — BP 152/81 | HR 71 | Ht 58.5 in | Wt 84.4 lb

## 2020-06-12 DIAGNOSIS — Z4689 Encounter for fitting and adjustment of other specified devices: Secondary | ICD-10-CM

## 2020-06-12 DIAGNOSIS — N390 Urinary tract infection, site not specified: Secondary | ICD-10-CM

## 2020-06-12 DIAGNOSIS — N813 Complete uterovaginal prolapse: Secondary | ICD-10-CM

## 2020-06-12 DIAGNOSIS — N939 Abnormal uterine and vaginal bleeding, unspecified: Secondary | ICD-10-CM | POA: Diagnosis not present

## 2020-06-12 DIAGNOSIS — N952 Postmenopausal atrophic vaginitis: Secondary | ICD-10-CM

## 2020-06-12 LAB — POCT URINALYSIS DIPSTICK
Bilirubin, UA: NEGATIVE
Glucose, UA: NEGATIVE
Ketones, UA: NEGATIVE
Nitrite, UA: POSITIVE
Protein, UA: NEGATIVE
Spec Grav, UA: 1.015 (ref 1.010–1.025)
Urobilinogen, UA: 0.2 E.U./dL
pH, UA: 6 (ref 5.0–8.0)

## 2020-06-12 NOTE — Progress Notes (Signed)
    GYNECOLOGY PROGRESS NOTE  Subjective:    [name], female    DOB: 1933/02/20, 86 y.o.   MRN: [id number]  HPI  Patient is a 86 y.o. G41P1001 female who presents for routine pessary check. She has a history of cystocele, rectocele, complete procidentia,and incomplete bladder emptying.  She reports complaints of continued  vaginal spotting.  Does also note she is having issues with passing very hard stools. Is not painful, does not have to strain. Of note, last visit she was treated for a UTI (GBS).  Prior to this, also had an E. Coli UTI.   The following portions of the patient's history were reviewed and updated as appropriate: allergies, current medications, past family history, past medical history, past social history, past surgical history and problem list.  Review of Systems Pertinent items noted in HPI and remainder of comprehensive ROS otherwise negative.   Objective:   Blood pressure (!) 152/81, pulse 71, height 4' 10.5" (1.486 m), weight 84 lb 6.4 oz (38.3 kg). General appearance: alert and no distress Abdomen: soft, non-tender; bowel sounds normal; no masses,  no organomegaly Pelvic: the patient'sSize 3 ring with support pessary was removed, cleaned and replaced without complications.External exam appears normal.  Speculum examination revealedmild to moderate atrophicvaginal mucosa,no lesions.    Labs:  Results for orders placed or performed in visit on 06/12/20  POCT urinalysis dipstick  Result Value Ref Range   Color, UA yellow    Clarity, UA clear    Glucose, UA Negative Negative   Bilirubin, UA neg    Ketones, UA neg    Spec Grav, UA 1.015 1.010 - 1.025   Blood, UA large    pH, UA 6.0 5.0 - 8.0   Protein, UA Negative Negative   Urobilinogen, UA 0.2 0.2 or 1.0 E.U./dL   Nitrite, UA positive    Leukocytes, UA Moderate (2+) (A) Negative   Appearance yellow;clear    Odor       Assessment:   Pessary maintenance Pelvic organ prolapse  (cystocele, rectocele, and complete procidentia) Vaginal atrophy Vaginal spotting Recurrent UTI  Plan:   - Pessary cleaned and replaced today. To f/u in 10-12 weeks for pessary check.  - To continue use of Premarin cream use. Is using externally to introitus and sometimes using finger to insert internally. Can utilize internally with use of applicator.  - UA noted with nitrates again today.  Discussed options of treating empirically vs waiting on culture before treatment.  Patient desires to wait on culture. If continual infections occur, can consider referral to Urology.    Rubie Maid, MD [facility]

## 2020-06-13 ENCOUNTER — Telehealth: Payer: Self-pay

## 2020-06-13 NOTE — Telephone Encounter (Signed)
Patient called in stating that she would like the nurse to call her back as she is reading her notes from her visit and states that she would like to decline the Rx amoxicillin. Patient states she would like this taken a little more seriously and that she was going to do her own research. Could you please advise?

## 2020-06-13 NOTE — Telephone Encounter (Signed)
Spoke to pt concerning her call to the office. Pt was informed that we did not call in Amoxil for her yesterday. Pt stated that she do not want to take Amoxil due to having possible side effects of joint pain/jaw pain but is not sure if it was the Amoxil or something else. Pt stated that she wanted to do her own research concerning finding an urologist. Pt stated that she would give [place] a call when she find someone that has experience and that she likes then we can do the referral.

## 2020-06-15 ENCOUNTER — Other Ambulatory Visit: Payer: Self-pay

## 2020-06-15 LAB — URINE CULTURE

## 2020-06-15 MED ORDER — NITROFURANTOIN MONOHYD MACRO 100 MG PO CAPS: 100.0000 mg | ORAL_CAPSULE | Freq: Two times a day (BID) | ORAL | 0 refills | Status: DC

## 2020-06-20 ENCOUNTER — Other Ambulatory Visit: Payer: Self-pay

## 2020-06-20 MED ORDER — NITROFURANTOIN MONOHYD MACRO 100 MG PO CAPS: 100.0000 mg | ORAL_CAPSULE | Freq: Two times a day (BID) | ORAL | 0 refills | Status: DC

## 2020-07-04 ENCOUNTER — Encounter: Payer: Medicare Other | Admitting: Obstetrics and Gynecology

## 2020-08-08 ENCOUNTER — Other Ambulatory Visit: Payer: Self-pay

## 2020-08-08 ENCOUNTER — Ambulatory Visit (INDEPENDENT_AMBULATORY_CARE_PROVIDER_SITE_OTHER): Payer: Medicare Other | Admitting: Obstetrics and Gynecology

## 2020-08-08 ENCOUNTER — Encounter: Payer: Self-pay | Admitting: Obstetrics and Gynecology

## 2020-08-08 VITALS — BP 100/80 | HR 71 | Ht 58.5 in | Wt 82.4 lb

## 2020-08-08 DIAGNOSIS — N813 Complete uterovaginal prolapse: Secondary | ICD-10-CM | POA: Diagnosis not present

## 2020-08-08 DIAGNOSIS — N952 Postmenopausal atrophic vaginitis: Secondary | ICD-10-CM | POA: Diagnosis not present

## 2020-08-08 DIAGNOSIS — Z4689 Encounter for fitting and adjustment of other specified devices: Secondary | ICD-10-CM

## 2020-08-08 DIAGNOSIS — R636 Underweight: Secondary | ICD-10-CM | POA: Diagnosis not present

## 2020-08-08 NOTE — Progress Notes (Signed)
Pt present for pessary check. Pt stated

## 2020-08-08 NOTE — Patient Instructions (Signed)

## 2020-08-08 NOTE — Progress Notes (Signed)
    GYNECOLOGY PROGRESS NOTE  Subjective:    [name], female    DOB: 30-Jan-1933, 87 y.o.   MRN: [id number]  HPI  Patient is a 87 y.o. G64P1001 female who presents for routine pessary check. She has a history of cystocele, rectocele, complete procidentia,and incomplete bladder emptying.     The following portions of the patient's history were reviewed and updated as appropriate: allergies, current medications, past family history, past medical history, past social history, past surgical history and problem list.  Review of Systems Pertinent items noted in HPI and remainder of comprehensive ROS otherwise negative.   Objective:   Blood pressure 100/80, pulse 71, height 4' 10.5" (1.486 m), weight 82 lb 6.4 oz (37.4 kg).  Body mass index is 16.93 kg/m.  General appearance: alert and no distress Abdomen: soft, non-tender; bowel sounds normal; no masses,  no organomegaly Pelvic: the patient'sSize 3 ring with support pessary was removed, cleaned and replaced without complications.External exam appears normal.  Speculum examination revealedmild to moderate atrophicvaginal mucosa,no lesions.     Assessment:   Pessary maintenance Pelvic organ prolapse (cystocele, rectocele, and complete procidentia) Vaginal atrophy Underweight  Plan:   - Pessary cleaned and replaced today. To f/u in 10-12 weeks for pessary check.  - To continue use of Premarin cream use.  - Underweight, discussed concerns with patient.  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 Advised on possible increase in dietary protein/higher caloric intake or use of supplemental protein shakes.   Rubie Maid, MD [facility]

## 2020-08-27 ENCOUNTER — Other Ambulatory Visit: Payer: Self-pay

## 2020-08-27 MED ORDER — TRIMO-SAN 0.025 % VA GEL: 1.0000 "application " | VAGINAL | 0 refills | Status: DC

## 2020-08-30 ENCOUNTER — Telehealth: Payer: Self-pay

## 2020-08-30 NOTE — Telephone Encounter (Signed)
Pt called in and stated that she needs a refill on her Trimo-san gel sent to her  pharmacy [address] Please advise

## 2020-08-30 NOTE — Telephone Encounter (Signed)
Pt called no answer and unable to LM due to no VM picked up was calling pt to let her know that I refilled the medication Trimo-sam on 08/27/2020 and it was sent to [facility].

## 2020-08-31 NOTE — Telephone Encounter (Signed)
Pt called no answer no vm picked up. Was calling the pt to let her know that her medication Trimo-san cream was refilled and sent to [facility] on 08/27/2020.

## 2020-09-04 ENCOUNTER — Telehealth: Payer: Self-pay

## 2020-09-04 NOTE — Telephone Encounter (Signed)
Pt called in and needs another refill on her OXYQUINOLONE SULFATE VAGINAL   sent to the [facility] on [address] The pt said that when she went to pick up the last one they gave her an expired tube of gel. Please advise

## 2020-09-05 ENCOUNTER — Telehealth: Payer: Self-pay

## 2020-09-05 MED ORDER — TRIMO-SAN 0.025 % VA GEL: 1.0000 "application " | VAGINAL | 0 refills | Status: DC

## 2020-09-05 NOTE — Telephone Encounter (Signed)
Pt called in and stated that she felt like no one care about her due to her pharmacy do not have her trimo san cream and she was in pain and having other issues. Pt was advised that we were sorry that the pharmacy did not have her cream and that we did not have that cream in the office for samples. Pt was asked several times what pharmacy did she want her medication sent to due to her current pharmacy do not have the medication. Pt stated that she would like the medication sent to [facility]. Pt was still upset stating that no one cared about her and that she was in pain. Pt was informed to check with the pharmacy on the status of the refill of the medication. Later on in the conversation pt stated would I let her know when her medication is ready for pick up at the pharmacy.

## 2020-09-05 NOTE — Telephone Encounter (Signed)
Please advise. Angela Mason

## 2020-09-05 NOTE — Telephone Encounter (Signed)
Attempted to contact patient using the phone number listed. Telephone rang about 22 times. The phone then cut off- no answering machine available. Unable to leave message

## 2020-09-07 NOTE — Telephone Encounter (Signed)
Pt called no answer and no vm picked up to LM. Was calling pt to check on the status of her trimo-san cream at her pharmacy and also inform that I found a tube of the cream at the office for her to pick up.

## 2020-09-13 ENCOUNTER — Telehealth: Payer: Self-pay

## 2020-09-13 NOTE — Telephone Encounter (Signed)
Pt was contacted and asked about the issue with the pharmacy regarding the cream. The pt said that she got the issue resolved and that she thanked [place] for reaching out

## 2020-09-14 NOTE — Telephone Encounter (Signed)
Radium spoke to pt yesterday and pt picked up her cream from the pharmacy.

## 2020-09-17 ENCOUNTER — Other Ambulatory Visit: Payer: Self-pay | Admitting: Internal Medicine

## 2020-10-04 ENCOUNTER — Emergency Department: Payer: Medicare Other

## 2020-10-04 ENCOUNTER — Inpatient Hospital Stay
Admission: EM | Admit: 2020-10-04 | Discharge: 2020-10-16 | DRG: 689 | Disposition: A | Discharge status: Skilled Nursing Facility | Payer: Medicare Other | Source: Skilled Nursing Facility | Attending: Internal Medicine | Admitting: Internal Medicine

## 2020-10-04 ENCOUNTER — Other Ambulatory Visit: Payer: Self-pay

## 2020-10-04 DIAGNOSIS — G9341 Metabolic encephalopathy: Secondary | ICD-10-CM | POA: Diagnosis present

## 2020-10-04 DIAGNOSIS — I160 Hypertensive urgency: Secondary | ICD-10-CM | POA: Diagnosis present

## 2020-10-04 DIAGNOSIS — R5381 Other malaise: Secondary | ICD-10-CM | POA: Diagnosis present

## 2020-10-04 DIAGNOSIS — E871 Hypo-osmolality and hyponatremia: Secondary | ICD-10-CM | POA: Diagnosis present

## 2020-10-04 DIAGNOSIS — F419 Anxiety disorder, unspecified: Secondary | ICD-10-CM | POA: Diagnosis present

## 2020-10-04 DIAGNOSIS — Z820 Family history of epilepsy and other diseases of the nervous system: Secondary | ICD-10-CM

## 2020-10-04 DIAGNOSIS — E860 Dehydration: Secondary | ICD-10-CM | POA: Diagnosis present

## 2020-10-04 DIAGNOSIS — Z681 Body mass index (BMI) 19 or less, adult: Secondary | ICD-10-CM

## 2020-10-04 DIAGNOSIS — Z79899 Other long term (current) drug therapy: Secondary | ICD-10-CM

## 2020-10-04 DIAGNOSIS — I10 Essential (primary) hypertension: Secondary | ICD-10-CM | POA: Diagnosis not present

## 2020-10-04 DIAGNOSIS — E782 Mixed hyperlipidemia: Secondary | ICD-10-CM | POA: Diagnosis present

## 2020-10-04 DIAGNOSIS — F323 Major depressive disorder, single episode, severe with psychotic features: Secondary | ICD-10-CM

## 2020-10-04 DIAGNOSIS — N390 Urinary tract infection, site not specified: Secondary | ICD-10-CM | POA: Diagnosis present

## 2020-10-04 DIAGNOSIS — Z85828 Personal history of other malignant neoplasm of skin: Secondary | ICD-10-CM

## 2020-10-04 DIAGNOSIS — Z9842 Cataract extraction status, left eye: Secondary | ICD-10-CM

## 2020-10-04 DIAGNOSIS — Z87891 Personal history of nicotine dependence: Secondary | ICD-10-CM

## 2020-10-04 DIAGNOSIS — E785 Hyperlipidemia, unspecified: Secondary | ICD-10-CM | POA: Diagnosis not present

## 2020-10-04 DIAGNOSIS — G9340 Encephalopathy, unspecified: Secondary | ICD-10-CM | POA: Insufficient documentation

## 2020-10-04 DIAGNOSIS — Z9841 Cataract extraction status, right eye: Secondary | ICD-10-CM

## 2020-10-04 DIAGNOSIS — E222 Syndrome of inappropriate secretion of antidiuretic hormone: Secondary | ICD-10-CM | POA: Diagnosis present

## 2020-10-04 DIAGNOSIS — Z20822 Contact with and (suspected) exposure to covid-19: Secondary | ICD-10-CM | POA: Diagnosis present

## 2020-10-04 DIAGNOSIS — Z9114 Patient's other noncompliance with medication regimen: Secondary | ICD-10-CM

## 2020-10-04 DIAGNOSIS — E039 Hypothyroidism, unspecified: Secondary | ICD-10-CM | POA: Diagnosis present

## 2020-10-04 DIAGNOSIS — Z825 Family history of asthma and other chronic lower respiratory diseases: Secondary | ICD-10-CM

## 2020-10-04 DIAGNOSIS — R4182 Altered mental status, unspecified: Secondary | ICD-10-CM | POA: Diagnosis present

## 2020-10-04 DIAGNOSIS — Z8249 Family history of ischemic heart disease and other diseases of the circulatory system: Secondary | ICD-10-CM

## 2020-10-04 DIAGNOSIS — Z5329 Procedure and treatment not carried out because of patient's decision for other reasons: Secondary | ICD-10-CM | POA: Diagnosis not present

## 2020-10-04 DIAGNOSIS — Z961 Presence of intraocular lens: Secondary | ICD-10-CM | POA: Diagnosis present

## 2020-10-04 DIAGNOSIS — Z881 Allergy status to other antibiotic agents status: Secondary | ICD-10-CM

## 2020-10-04 DIAGNOSIS — Z1389 Encounter for screening for other disorder: Secondary | ICD-10-CM

## 2020-10-04 DIAGNOSIS — E43 Unspecified severe protein-calorie malnutrition: Secondary | ICD-10-CM | POA: Diagnosis present

## 2020-10-04 LAB — CBC
HCT: 36.1 % (ref 36.0–46.0)
Hemoglobin: 12.4 g/dL (ref 12.0–15.0)
MCH: 32 pg (ref 26.0–34.0)
MCHC: 34.3 g/dL (ref 30.0–36.0)
MCV: 93 fL (ref 80.0–100.0)
Platelets: 273 10*3/uL (ref 150–400)
RBC: 3.88 MIL/uL (ref 3.87–5.11)
RDW: 11.7 % (ref 11.5–15.5)
WBC: 6.4 10*3/uL (ref 4.0–10.5)
nRBC: 0 % (ref 0.0–0.2)

## 2020-10-04 LAB — URINALYSIS, COMPLETE (UACMP) WITH MICROSCOPIC
Bacteria, UA: NONE SEEN
Bilirubin Urine: NEGATIVE
Glucose, UA: NEGATIVE mg/dL
Hgb urine dipstick: NEGATIVE
Ketones, ur: 20 mg/dL — AB
Leukocytes,Ua: NEGATIVE
Nitrite: NEGATIVE
Protein, ur: NEGATIVE mg/dL
Specific Gravity, Urine: 1.008 (ref 1.005–1.030)
Squamous Epithelial / HPF: NONE SEEN (ref 0–5)
pH: 6 (ref 5.0–8.0)

## 2020-10-04 LAB — RESP PANEL BY RT-PCR (FLU A&B, COVID) ARPGX2
Influenza A by PCR: NEGATIVE
Influenza B by PCR: NEGATIVE
SARS Coronavirus 2 by RT PCR: NEGATIVE

## 2020-10-04 LAB — COMPREHENSIVE METABOLIC PANEL
ALT: 20 U/L (ref 0–44)
AST: 24 U/L (ref 15–41)
Albumin: 3.9 g/dL (ref 3.5–5.0)
Alkaline Phosphatase: 58 U/L (ref 38–126)
Anion gap: 10 (ref 5–15)
BUN: 11 mg/dL (ref 8–23)
CO2: 27 mmol/L (ref 22–32)
Calcium: 8.9 mg/dL (ref 8.9–10.3)
Chloride: 87 mmol/L — ABNORMAL LOW (ref 98–111)
Creatinine, Ser: 0.53 mg/dL (ref 0.44–1.00)
GFR, Estimated: >60 mL/min (ref >60)
Glucose, Bld: 123 mg/dL — ABNORMAL HIGH (ref 70–99)
Potassium: 3.7 mmol/L (ref 3.5–5.1)
Sodium: 124 mmol/L — ABNORMAL LOW (ref 135–145)
Total Bilirubin: 0.9 mg/dL (ref 0.3–1.2)
Total Protein: 6.7 g/dL (ref 6.5–8.1)

## 2020-10-04 LAB — BASIC METABOLIC PANEL
Anion gap: 10 (ref 5–15)
BUN: 9 mg/dL (ref 8–23)
CO2: 28 mmol/L (ref 22–32)
Calcium: 9 mg/dL (ref 8.9–10.3)
Chloride: 86 mmol/L — ABNORMAL LOW (ref 98–111)
Creatinine, Ser: 0.41 mg/dL — ABNORMAL LOW (ref 0.44–1.00)
GFR, Estimated: >60 mL/min (ref >60)
Glucose, Bld: 107 mg/dL — ABNORMAL HIGH (ref 70–99)
Potassium: 4.2 mmol/L (ref 3.5–5.1)
Sodium: 124 mmol/L — ABNORMAL LOW (ref 135–145)

## 2020-10-04 LAB — OSMOLALITY
Osmolality: 261 mOsm/kg — ABNORMAL LOW (ref 275–295)
Osmolality: 259 mOsm/kg — ABNORMAL LOW (ref 275–295)

## 2020-10-04 LAB — SODIUM, URINE, RANDOM: Sodium, Ur: 48 mmol/L

## 2020-10-04 LAB — CREATININE, URINE, RANDOM: Creatinine, Urine: 38 mg/dL

## 2020-10-04 LAB — OSMOLALITY, URINE: Osmolality, Ur: 300 mOsm/kg (ref 300–900)

## 2020-10-04 LAB — TSH: TSH: 0.657 u[IU]/mL (ref 0.350–4.500)

## 2020-10-04 LAB — BRAIN NATRIURETIC PEPTIDE: B Natriuretic Peptide: 140.3 pg/mL — ABNORMAL HIGH (ref 0.0–100.0)

## 2020-10-04 MED ORDER — ATENOLOL 25 MG PO TABS
25.0000 mg | ORAL_TABLET | ORAL | Status: DC
  Administered 2020-10-04 – 2020-10-06 (×2): 25 mg via ORAL
  Filled 2020-10-04 (×3): qty 1

## 2020-10-04 MED ORDER — OMEGA-3-ACID ETHYL ESTERS 1 G PO CAPS
1.0000 g | ORAL_CAPSULE | Freq: Every day | ORAL | Status: DC
  Administered 2020-10-04 – 2020-10-16 (×9): 1 g via ORAL
  Filled 2020-10-04 (×11): qty 1

## 2020-10-04 MED ORDER — SODIUM CHLORIDE 0.9 % IV SOLN: INTRAVENOUS | Status: AC

## 2020-10-04 MED ORDER — ACETAMINOPHEN 325 MG PO TABS: 650.0000 mg | ORAL_TABLET | Freq: Four times a day (QID) | ORAL | Status: DC | PRN

## 2020-10-04 MED ORDER — ONDANSETRON HCL 4 MG/2ML IJ SOLN: 4.0000 mg | Freq: Three times a day (TID) | INTRAMUSCULAR | Status: DC | PRN

## 2020-10-04 MED ORDER — AMLODIPINE BESYLATE 5 MG PO TABS
5.0000 mg | ORAL_TABLET | Freq: Every day | ORAL | Status: DC
  Administered 2020-10-04 – 2020-10-07 (×4): 5 mg via ORAL
  Filled 2020-10-04 (×5): qty 1

## 2020-10-04 MED ORDER — LACTATED RINGERS IV BOLUS
500.0000 mL | Freq: Once | INTRAVENOUS | Status: AC
  Administered 2020-10-04: 500 mL via INTRAVENOUS

## 2020-10-04 MED ORDER — CO Q 10 10 MG PO CAPS: 1.0000 | ORAL_CAPSULE | Freq: Every day | ORAL | Status: DC

## 2020-10-04 MED ORDER — PRAVASTATIN SODIUM 20 MG PO TABS
20.0000 mg | ORAL_TABLET | Freq: Every day | ORAL | Status: DC
  Administered 2020-10-04 – 2020-10-15 (×10): 20 mg via ORAL
  Filled 2020-10-04 (×15): qty 1

## 2020-10-04 MED ORDER — OCUVITE-LUTEIN PO CAPS
1.0000 | ORAL_CAPSULE | Freq: Every day | ORAL | Status: DC
  Administered 2020-10-05 – 2020-10-16 (×9): 1 via ORAL
  Filled 2020-10-04 (×12): qty 1

## 2020-10-04 MED ORDER — SODIUM CHLORIDE 0.9 % IV BOLUS
500.0000 mL | Freq: Once | INTRAVENOUS | Status: AC
  Administered 2020-10-04: 500 mL via INTRAVENOUS

## 2020-10-04 MED ORDER — HYDRALAZINE HCL 20 MG/ML IJ SOLN: 5.0000 mg | INTRAMUSCULAR | Status: DC | PRN

## 2020-10-04 MED ORDER — NAPHAZOLINE-GLYCERIN 0.012-0.2 % OP SOLN
1.0000 [drp] | Freq: Four times a day (QID) | OPHTHALMIC | Status: DC | PRN
  Filled 2020-10-04: qty 15

## 2020-10-04 MED ORDER — CRANBERRY 250 MG PO TABS: 1.0000 | ORAL_TABLET | Freq: Every day | ORAL | Status: DC

## 2020-10-04 MED ORDER — ENOXAPARIN SODIUM 30 MG/0.3ML ~~LOC~~ SOLN
30.0000 mg | SUBCUTANEOUS | Status: DC
  Administered 2020-10-04 – 2020-10-15 (×10): 30 mg via SUBCUTANEOUS
  Filled 2020-10-04 (×11): qty 0.3

## 2020-10-04 MED ORDER — PRESERVISION AREDS PO TABS: 1.0000 | ORAL_TABLET | Freq: Two times a day (BID) | ORAL | Status: DC

## 2020-10-04 MED ORDER — ACETAMINOPHEN 650 MG RE SUPP: 650.0000 mg | Freq: Four times a day (QID) | RECTAL | Status: DC | PRN

## 2020-10-04 MED ORDER — CEFTRIAXONE SODIUM 1 G IJ SOLR
1.0000 g | INTRAMUSCULAR | Status: AC
  Administered 2020-10-04 – 2020-10-06 (×3): 1 g via INTRAVENOUS
  Filled 2020-10-04 (×3): qty 10

## 2020-10-04 MED ORDER — CALCIUM CARBONATE-VITAMIN D 500-200 MG-UNIT PO TABS
1.0000 | ORAL_TABLET | Freq: Two times a day (BID) | ORAL | Status: DC
  Administered 2020-10-04 – 2020-10-16 (×16): 1 via ORAL
  Filled 2020-10-04 (×19): qty 1

## 2020-10-04 MED ORDER — ALBUTEROL SULFATE HFA 108 (90 BASE) MCG/ACT IN AERS
2.0000 | INHALATION_SPRAY | RESPIRATORY_TRACT | Status: DC | PRN
  Filled 2020-10-04: qty 6.7

## 2020-10-04 MED ORDER — DM-GUAIFENESIN ER 30-600 MG PO TB12: 1.0000 | ORAL_TABLET | Freq: Two times a day (BID) | ORAL | Status: DC | PRN

## 2020-10-04 NOTE — ED Provider Notes (Signed)
[facility] Emergency Department Provider Note  ____________________________________________   Event Date/Time   First MD Initiated Contact with Patient 10/04/20 1154     (approximate)  I have reviewed the triage vital signs and the nursing notes.   HISTORY  Chief Complaint Altered Mental Status and UTI    HPI Angela Mason is a 87 y.o. female with history of hypertension, hyperlipidemia, here with increasing confusion.  History is provided primarily by the nurse who accompanies the patient.  The patient is normally alert, oriented, drives, and independent.  She lives in independent living.  Her husband is in the skilled nursing portion of [place].  The patient reportedly has been increasingly confused over the last week.  She was seen and diagnosed with UTI on 3/7 and started on cefuroxime.  Since then, she has had increasing confusion and paranoia.  She is not acting like herself.  She has been forgetting things.  She has been eating and drinking very little.  Of note, patient's nurse does state that the patient's husband has had declining health so she has had difficulty in stress related to this.  She does not know whether patient has been taking the antibiotic or medications.  History limited otherwise by her confusion.        Past Medical History:  Diagnosis Date  . Cancer ([place])    SKIN  . COPD, mild ([place])   . Dyspnea   . Edema   . HLD (hyperlipidemia)   . HTN (hypertension)   . Hypothyroidism   . Macular degeneration, bilateral   . Varicosities     Patient Active Problem List   Diagnosis Date Noted  . Underweight 08/08/2020  . 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 08/04/2018  . Cystocele, midline 05/05/2017  . Nocturia 10/07/2016  . Unstable bladder 10/07/2016  . Vaginal atrophy 01/23/2016  . 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 Midline cystocele 11/01/2015    Past Surgical History:  Procedure Laterality  Date  . CATARACT EXTRACTION W/PHACO Right 02/03/2017   Procedure: CATARACT EXTRACTION PHACO AND INTRAOCULAR LENS PLACEMENT (IOC);  Surgeon: Birder Robson, MD;  Location: [facility] ORS;  Service: Ophthalmology;  Laterality: Right;  [place] 00:57.2 AP% 18.5 CDE 10.58 Fluid pack lot # [id number]  . CATARACT EXTRACTION W/PHACO Left 02/24/2017   Procedure: CATARACT EXTRACTION PHACO AND INTRAOCULAR LENS PLACEMENT (IOC);  Surgeon: Birder Robson, MD;  Location: [facility] ORS;  Service: Ophthalmology;  Laterality: Left;  [place] 01:12 AP% 25.0 CDE 18.03 Fluid pack lot # [id number]  . MOHS SURGERY  2015   nose  . SALIVARY GLAND SURGERY Left 2012  . TONSILLECTOMY  1940    Prior to Admission medications   Medication Sig Start Date End Date Taking? Authorizing Provider  atenolol (TENORMIN) 25 MG tablet Take 25 mg by mouth every other day.     [provider]  calcium-vitamin D (OSCAL WITH D) 500-200 MG-UNIT tablet Take 1 tablet by mouth 2 (two) times daily.    [provider]  Coenzyme Q10 (CO Q 10 PO) Take 1 tablet by mouth daily.     [provider]  CRANBERRY PO Take by mouth.    [provider]  lovastatin (MEVACOR) 20 MG tablet Take 20 mg by mouth at bedtime.    [provider]  Multiple Vitamins-Minerals (PRESERVISION AREDS PO) Take 1 tablet by mouth 2 (two) times daily.     [provider]  Omega-3 Fatty Acids (FISH OIL CONCENTRATE PO) Take 1  tablet by mouth daily.     [provider]  OXYQUINOLONE SULFATE VAGINAL (TRIMO-SAN) 0.025 % GEL Place 1 application vaginally once a week. 09/05/20   Rubie Maid, MD  tetrahydrozoline 0.05 % ophthalmic solution Place 1 drop into both eyes daily as needed (dry eyes).    [provider]    Allergies Gentamicin and Levofloxacin  Family History  Problem Relation Age of Onset  . COPD Father   . Heart failure Father   . Diverticulitis Mother   . Macular degeneration Mother   . Epilepsy Sister   .  Parkinson's disease Brother   . Cancer Neg Hx   . Diabetes Neg Hx   . Breast cancer Neg Hx     Social History Social History   Tobacco Use  . Smoking status: Former Smoker    Quit date: 09/05/2005    Years since quitting: 15.0  . Smokeless tobacco: Never Used  Vaping Use  . Vaping Use: Never used  Substance Use Topics  . Alcohol use: Yes    Alcohol/week: 0.0 standard drinks    Comment: 1 qd  . Drug use: No    Review of Systems  Review of Systems  Unable to perform ROS: Mental status change     ____________________________________________  PHYSICAL EXAM:      VITAL SIGNS: ED Triage Vitals  Enc Vitals Group     BP 10/04/20 1017 (S) (!) 207/92     Pulse Rate 10/04/20 1017 73     Resp 10/04/20 1017 18     Temp 10/04/20 1017 97.7 F (36.5 C)     Temp src --      SpO2 10/04/20 1017 100 %     Weight --      Height --      Head Circumference --      Peak Flow --      Pain Score 10/04/20 1015 0     Pain Loc --      Pain Edu? --      Excl. in [place]? --      Physical Exam Vitals and nursing note reviewed.  Constitutional:      General: She is not in acute distress.    Appearance: She is well-developed.  HENT:     Head: Normocephalic and atraumatic.  Eyes:     Conjunctiva/sclera: Conjunctivae normal.  Cardiovascular:     Rate and Rhythm: Normal rate and regular rhythm.     Heart sounds: Normal heart sounds.  Pulmonary:     Effort: Pulmonary effort is normal. No respiratory distress.     Breath sounds: No wheezing.  Abdominal:     General: There is no distension.  Musculoskeletal:     Cervical back: Neck supple.  Skin:    General: Skin is warm.     Capillary Refill: Capillary refill takes less than 2 seconds.     Findings: No rash.  Neurological:     Mental Status: She is alert and oriented to person, place, and time.     Motor: No abnormal muscle tone.  Psychiatric:     Comments: Withdrawn, paranoid. CN intact. MAE with at least antigravity strength.  Does not participate in sensory exam.       ____________________________________________   LABS (all labs ordered are listed, but only abnormal results are displayed)  Labs Reviewed  COMPREHENSIVE METABOLIC PANEL - Abnormal; Notable for the following components:      Result Value   Sodium 124 (*)  Chloride 87 (*)    Glucose, Bld 123 (*)    All other components within normal limits  URINALYSIS, COMPLETE (UACMP) WITH MICROSCOPIC - Abnormal; Notable for the following components:   Color, Urine STRAW (*)    APPearance CLEAR (*)    Ketones, ur 20 (*)    All other components within normal limits  URINE CULTURE  RESP PANEL BY RT-PCR (FLU A&B, COVID) ARPGX2  CBC  OSMOLALITY, URINE  SODIUM, URINE, RANDOM  CREATININE, URINE, RANDOM  OSMOLALITY    ____________________________________________  EKG:  ________________________________________  RADIOLOGY All imaging, including plain films, CT scans, and ultrasounds, independently reviewed by me, and interpretations confirmed via formal radiology reads.  ED MD interpretation:   CT Head: NAICA CXR: Clear  Official radiology report(s): CT Head Wo Contrast  Result Date: 10/04/2020 CLINICAL DATA:  Confusion/delirium EXAM: CT HEAD WITHOUT CONTRAST TECHNIQUE: Contiguous axial images were obtained from the base of the skull through the vertex without intravenous contrast. COMPARISON:  None. FINDINGS: Brain: Mild to moderate diffuse atrophy is present. There is no intracranial mass, hemorrhage extra-axial fluid collection, or midline shift. There is mild decreased attenuation in the centra semiovale bilaterally. Elsewhere brain parenchyma appears unremarkable. No acute infarct evident. Vascular: No hyperdense vessel. There are foci of calcification in the carotid siphon regions. Skull: The bony calvarium appears intact. Sinuses/Orbits: Visualized paranasal sinuses are clear. Visualized orbits appear symmetric bilaterally. Other: Visualized  mastoid air cells are clear. IMPRESSION: Atrophy with slight periventricular small vessel disease. No acute infarct. No mass or hemorrhage. There are foci of arterial vascular calcification. Electronically Signed   By: Lowella Grip III M.D.   On: 10/04/2020 13:18   DG Chest Portable 1 View  Result Date: 10/04/2020 CLINICAL DATA:  Delirium/confusion EXAM: PORTABLE CHEST 1 VIEW COMPARISON:  None. FINDINGS: Lungs are clear. Heart size and pulmonary vascularity are normal. No adenopathy. There is aortic atherosclerosis. No bone lesions. IMPRESSION: Lungs clear. Heart size normal. Aortic Atherosclerosis (ICD10-I70.0). Electronically Signed   By: Lowella Grip III M.D.   On: 10/04/2020 13:19    ____________________________________________  PROCEDURES   Procedure(s) performed (including Critical Care):  Procedures  ____________________________________________  INITIAL IMPRESSION / MDM / [place] / ED COURSE  As part of my medical decision making, I reviewed the following data within the [place] notes reviewed and incorporated, Old chart reviewed, Notes from prior ED visits, and [place] Controlled Substance Database       *Angela Mason was evaluated in Emergency Department on 10/04/2020 for the symptoms described in the history of present illness. She was evaluated in the context of the global COVID-19 pandemic, which necessitated consideration that the patient might be at risk for infection with the SARS-CoV-2 virus that causes COVID-19. Institutional protocols and algorithms that pertain to the evaluation of patients at risk for COVID-19 are in a state of rapid change based on information released by regulatory bodies including the CDC and federal and state organizations. These policies and algorithms were followed during the patient's care in the ED.  Some ED evaluations and interventions may be delayed as a result of limited staffing during the  pandemic.*     Medical Decision Making: 87 year old female here with increasing confusion.  No focal neurological deficits.  On arrival, patient mildly hypertensive but otherwise hemodynamically stable.  Lab work shows significant hyponatremia and hypochloride EMEA.  UA with ketonuria.  Urine and serum electrolytes are consistent with likely prerenal etiology with FeNA 0.5%. Will start  fluids, admit to medicine. UA without signs of ongoing UTI (has been on ABX since 3/7). CT Head reviewed and unremarkable. CXR without PNA.  ____________________________________________  FINAL CLINICAL IMPRESSION(S) / ED DIAGNOSES  Final diagnoses:  Hyponatremia  Encephalopathy     MEDICATIONS GIVEN DURING THIS VISIT:  Medications  lactated ringers bolus 500 mL (has no administration in time range)     ED Discharge Orders    None       Note:  This document was prepared using Dragon voice recognition software and may include unintentional dictation errors.   Duffy Bruce, MD 10/04/20 1357

## 2020-10-04 NOTE — ED Notes (Signed)
Daughter Angela Mason, lives in [place] Phone # [phone] email [email or web address]

## 2020-10-04 NOTE — ED Notes (Signed)
Patient transported to CT 

## 2020-10-04 NOTE — Progress Notes (Signed)
PHARMACIST - PHYSICIAN COMMUNICATION  CONCERNING:  Enoxaparin (Lovenox) for DVT Prophylaxis    RECOMMENDATION: Patient was prescribed enoxaprin 40mg  q24 hours for VTE prophylaxis.   Filed Weights   10/04/20 1640  Weight: 36.7 kg (80 lb 12.8 oz)    Body mass index is 16.6 kg/m.  Estimated Creatinine Clearance: 29.8 mL/min (A) (by C-G formula based on SCr of 0.41 mg/dL (L)).  Patient is candidate for enoxaparin 30mg  every 24 hours based on CrCl <80ml/min or Weight <45kg  DESCRIPTION: Pharmacy has adjusted enoxaparin dose per [facility] policy.  Patient is now receiving enoxaparin 30 mg every 24 hours   Benn Moulder, PharmD Pharmacy Resident  10/04/2020 4:47 PM

## 2020-10-04 NOTE — ED Triage Notes (Addendum)
Pt comes with c/o possible UTI and increased confusion. Pt was dx with UTI on March 7 and was prescribed antx. Pt was not able to take medication on certain days due to not feeling well.  Staff RN concerned pt's UTI has gotten worse.  Pt denies any urinary symptoms.  Pt has been hallucinating and paranoid per [place] with pt.

## 2020-10-04 NOTE — ED Notes (Signed)
Pt ambulatory to the restroom with assist x1

## 2020-10-04 NOTE — H&P (Addendum)
History and Physical    Angela Mason BWL:[id number] DOB: 1932/08/21 DOA: 10/04/2020  Referring MD/NP/PA:   PCP: Albina Billet, MD   Patient coming from:  The patient is coming from independent living facility.  At baseline, pt is independent for most of ADL.        Chief Complaint: AMS  HPI: Angela Mason is a 87 y.o. female with medical history significant of hypertension, hyperlipidemia, COPD, hypothyroidism, cystocele, who presents with altered mental status.   Patient has confusion and cannot provide limited history.  I have tried to call her husband without success.  Per report, and her normal baseline, patient is independent, normally alert, oriented, and can drive car. Pt was noted to be confused in the past severe days, with paranoia. She is not acting like herself.    She has poor appetite and decreased oral intake. She was diagnosed with UTI on 3/7 and started on cefuroxime.  When I saw pt in ED, she is mildly confused, but is oriented x3.  She moves all extremities normally.  No facial droop or slurred speech.  Patient states that she still has burning on urination, denies dysuria.  No chest pain, cough, shortness of breath.  No nausea vomiting, diarrhea or abdominal pain.  ED Course: pt was found to have negative urinalysis, WBC 6.4, pending Covid PCR, sodium level 124, renal function okay, temperature normal, blood pressure 2 7/72, 182/91, heart rate 65, RR 23, oxygen saturation 99% on room air.  Chest x-ray negative.  CT head negative for acute intracranial abnormalities.  Patient is placed on MedSurg bed for observation  Review of Systems:   General: no fevers, chills, no body weight gain, has fatigue HEENT: no blurry vision, hearing changes or sore throat Respiratory: no dyspnea, coughing, wheezing CV: no chest pain, no palpitations GI: no nausea, vomiting, abdominal pain, diarrhea, constipation GU: no dysuria, has burning on urination, no  increased urinary frequency,  hematuria  Ext: has leg edema Neuro: no unilateral weakness, numbness, or tingling, no vision change or hearing loss.  Skin: no rash, no skin tear. MSK: No muscle spasm, no deformity, no limitation of range of movement in spin Heme: No easy bruising.  Travel history: No recent long distant travel.  Allergy:  Allergies  Allergen Reactions  . Gentamicin   . Levofloxacin Diarrhea    Past Medical History:  Diagnosis Date  . Cancer ([place])    SKIN  . COPD, mild ([place])   . Dyspnea   . Edema   . HLD (hyperlipidemia)   . HTN (hypertension)   . Hypothyroidism   . Macular degeneration, bilateral   . Varicosities     Past Surgical History:  Procedure Laterality Date  . CATARACT EXTRACTION W/PHACO Right 02/03/2017   Procedure: CATARACT EXTRACTION PHACO AND INTRAOCULAR LENS PLACEMENT (IOC);  Surgeon: Birder Robson, MD;  Location: [facility] ORS;  Service: Ophthalmology;  Laterality: Right;  [place] 00:57.2 AP% 18.5 CDE 10.58 Fluid pack lot # [id number]  . CATARACT EXTRACTION W/PHACO Left 02/24/2017   Procedure: CATARACT EXTRACTION PHACO AND INTRAOCULAR LENS PLACEMENT (IOC);  Surgeon: Birder Robson, MD;  Location: [facility] ORS;  Service: Ophthalmology;  Laterality: Left;  [place] 01:12 AP% 25.0 CDE 18.03 Fluid pack lot # [id number]  . MOHS SURGERY  2015   nose  . SALIVARY GLAND SURGERY Left 2012  . TONSILLECTOMY  1940    Social History:  reports that she quit smoking about 15 years ago. She has never used smokeless tobacco. She reports current  alcohol use. She reports that she does not use drugs.  Family History:  Family History  Problem Relation Age of Onset  . COPD Father   . Heart failure Father   . Diverticulitis Mother   . Macular degeneration Mother   . Epilepsy Sister   . Parkinson's disease Brother   . Cancer Neg Hx   . Diabetes Neg Hx   . Breast cancer Neg Hx      Prior to Admission medications   Medication Sig Start Date End Date Taking? Authorizing Provider  atenolol  (TENORMIN) 25 MG tablet Take 25 mg by mouth every other day.     [provider]  calcium-vitamin D (OSCAL WITH D) 500-200 MG-UNIT tablet Take 1 tablet by mouth 2 (two) times daily.    [provider]  Coenzyme Q10 (CO Q 10 PO) Take 1 tablet by mouth daily.     [provider]  CRANBERRY PO Take by mouth.    [provider]  lovastatin (MEVACOR) 20 MG tablet Take 20 mg by mouth at bedtime.    [provider]  Multiple Vitamins-Minerals (PRESERVISION AREDS PO) Take 1 tablet by mouth 2 (two) times daily.     [provider]  Omega-3 Fatty Acids (FISH OIL CONCENTRATE PO) Take 1 tablet by mouth daily.     [provider]  OXYQUINOLONE SULFATE VAGINAL (TRIMO-SAN) 0.025 % GEL Place 1 application vaginally once a week. 09/05/20   Rubie Maid, MD  tetrahydrozoline 0.05 % ophthalmic solution Place 1 drop into both eyes daily as needed (dry eyes).    [provider]    Physical Exam: Vitals:   10/04/20 1309 10/04/20 1309 10/04/20 1330 10/04/20 1500  BP:  (!) 174/76 (!) 182/91   Pulse: 64 64 65 66  Resp: (!) 22 (!) 22 (!) 23 15  Temp:      SpO2: 99% 99% 99% 99%   General: Not in acute distress HEENT:       Eyes: PERRL, EOMI, no scleral icterus.       ENT: No discharge from the ears and nose, no pharynx injection, no tonsillar enlargement.        Neck: No JVD, no bruit, no mass felt. Heme: No neck lymph node enlargement. Cardiac: S1/S2, RRR, No murmurs, No gallops or rubs. Respiratory: No rales, wheezing, rhonchi or rubs. GI: Soft, nondistended, nontender, no organomegaly, BS present. GU: No hematuria Ext: has 1+ pitting leg edema bilaterally. 1+DP/PT pulse bilaterally. Musculoskeletal: No joint deformities, No joint redness or warmth, no limitation of ROM in spin. Skin: No rashes.  Neuro: mildly confused, but is oriented X3, cranial nerves II-XII grossly intact, moves all extremities normally.  Psych: Patient is not  psychotic, no suicidal or hemocidal ideation.  Labs on Admission: I have personally reviewed following labs and imaging studies  CBC: Recent Labs  Lab 10/04/20 1019  WBC 6.4  HGB 12.4  HCT 36.1  MCV 93.0  PLT 627   Basic Metabolic Panel: Recent Labs  Lab 10/04/20 1019 10/04/20 1518  NA 124* 124*  K 3.7 4.2  CL 87* 86*  CO2 27 28  GLUCOSE 123* 107*  BUN 11 9  CREATININE 0.53 0.41*  CALCIUM 8.9 9.0   GFR: CrCl cannot be calculated (Unknown ideal weight.). Liver Function Tests: Recent Labs  Lab 10/04/20 1019  AST 24  ALT 20  ALKPHOS 58  BILITOT 0.9  PROT 6.7  ALBUMIN 3.9   No results for input(s): LIPASE, AMYLASE in  the last 168 hours. No results for input(s): AMMONIA in the last 168 hours. Coagulation Profile: No results for input(s): INR, PROTIME in the last 168 hours. Cardiac Enzymes: No results for input(s): CKTOTAL, CKMB, CKMBINDEX, TROPONINI in the last 168 hours. BNP (last 3 results) No results for input(s): PROBNP in the last 8760 hours. HbA1C: No results for input(s): HGBA1C in the last 72 hours. CBG: No results for input(s): GLUCAP in the last 168 hours. Lipid Profile: No results for input(s): CHOL, HDL, LDLCALC, TRIG, CHOLHDL, LDLDIRECT in the last 72 hours. Thyroid Function Tests: No results for input(s): TSH, T4TOTAL, FREET4, T3FREE, THYROIDAB in the last 72 hours. Anemia Panel: No results for input(s): VITAMINB12, FOLATE, FERRITIN, TIBC, IRON, RETICCTPCT in the last 72 hours. Urine analysis:    Component Value Date/Time   COLORURINE STRAW (A) 10/04/2020 1230   APPEARANCEUR CLEAR (A) 10/04/2020 1230   APPEARANCEUR Clear 09/10/2015 1417   LABSPEC 1.008 10/04/2020 1230   PHURINE 6.0 10/04/2020 1230   GLUCOSEU NEGATIVE 10/04/2020 1230   HGBUR NEGATIVE 10/04/2020 1230   BILIRUBINUR NEGATIVE 10/04/2020 1230   BILIRUBINUR neg 06/12/2020 1652   BILIRUBINUR Negative 09/10/2015 1417   KETONESUR 20 (A) 10/04/2020 1230   PROTEINUR NEGATIVE  10/04/2020 1230   UROBILINOGEN 0.2 06/12/2020 1652   NITRITE NEGATIVE 10/04/2020 1230   LEUKOCYTESUR NEGATIVE 10/04/2020 1230   Sepsis Labs: @LABRCNTIP (procalcitonin:4,lacticidven:4) ) Recent Results (from the past 240 hour(s))  Resp Panel by RT-PCR (Flu A&B, Covid) Nasopharyngeal Swab     Status: None   Collection Time: 10/04/20  2:22 PM   Specimen: Nasopharyngeal Swab; Nasopharyngeal(NP) swabs in vial transport medium  Result Value Ref Range Status   SARS Coronavirus 2 by RT PCR NEGATIVE NEGATIVE Final    Comment: (NOTE) SARS-CoV-2 target nucleic acids are NOT DETECTED.  The SARS-CoV-2 RNA is generally detectable in upper respiratory specimens during the acute phase of infection. The lowest concentration of SARS-CoV-2 viral copies this assay can detect is 138 copies/mL. A negative result does not preclude SARS-Cov-2 infection and should not be used as the sole basis for treatment or other patient management decisions. A negative result may occur with  improper specimen collection/handling, submission of specimen other than nasopharyngeal swab, presence of viral mutation(s) within the areas targeted by this assay, and inadequate number of viral copies(<138 copies/mL). A negative result must be combined with clinical observations, patient history, and epidemiological information. The expected result is Negative.  Fact Sheet for Patients:  EntrepreneurPulse.com.au  Fact Sheet for Healthcare Providers:  IncredibleEmployment.be  This test is no t yet approved or cleared by the [place] FDA and  has been authorized for detection and/or diagnosis of SARS-CoV-2 by FDA under an Emergency Use Authorization (EUA). This EUA will remain  in effect (meaning this test can be used) for the duration of the COVID-19 declaration under Section 564(b)(1) of the Act, 21 U.S.C.section 360bbb-3(b)(1), unless the authorization is terminated  or revoked  sooner.       Influenza A by PCR NEGATIVE NEGATIVE Final   Influenza B by PCR NEGATIVE NEGATIVE Final    Comment: (NOTE) The Xpert Xpress SARS-CoV-2/FLU/RSV plus assay is intended as an aid in the diagnosis of influenza from Nasopharyngeal swab specimens and should not be used as a sole basis for treatment. Nasal washings and aspirates are unacceptable for Xpert Xpress SARS-CoV-2/FLU/RSV testing.  Fact Sheet for Patients: EntrepreneurPulse.com.au  Fact Sheet for Healthcare Providers: IncredibleEmployment.be  This test is not yet approved or cleared by the [place]  FDA and has been authorized for detection and/or diagnosis of SARS-CoV-2 by FDA under an Emergency Use Authorization (EUA). This EUA will remain in effect (meaning this test can be used) for the duration of the COVID-19 declaration under Section 564(b)(1) of the Act, 21 U.S.C. section 360bbb-3(b)(1), unless the authorization is terminated or revoked.  Performed at [facility], [address] [place], [place] [address]      Radiological Exams on Admission: CT Head Wo Contrast  Result Date: 10/04/2020 CLINICAL DATA:  Confusion/delirium EXAM: CT HEAD WITHOUT CONTRAST TECHNIQUE: Contiguous axial images were obtained from the base of the skull through the vertex without intravenous contrast. COMPARISON:  None. FINDINGS: Brain: Mild to moderate diffuse atrophy is present. There is no intracranial mass, hemorrhage extra-axial fluid collection, or midline shift. There is mild decreased attenuation in the centra semiovale bilaterally. Elsewhere brain parenchyma appears unremarkable. No acute infarct evident. Vascular: No hyperdense vessel. There are foci of calcification in the carotid siphon regions. Skull: The bony calvarium appears intact. Sinuses/Orbits: Visualized paranasal sinuses are clear. Visualized orbits appear symmetric bilaterally. Other: Visualized mastoid air cells  are clear. IMPRESSION: Atrophy with slight periventricular small vessel disease. No acute infarct. No mass or hemorrhage. There are foci of arterial vascular calcification. Electronically Signed   By: Lowella Grip III M.D.   On: 10/04/2020 13:18   DG Chest Portable 1 View  Result Date: 10/04/2020 CLINICAL DATA:  Delirium/confusion EXAM: PORTABLE CHEST 1 VIEW COMPARISON:  None. FINDINGS: Lungs are clear. Heart size and pulmonary vascularity are normal. No adenopathy. There is aortic atherosclerosis. No bone lesions. IMPRESSION: Lungs clear. Heart size normal. Aortic Atherosclerosis (ICD10-I70.0). Electronically Signed   By: Lowella Grip III M.D.   On: 10/04/2020 13:19     EKG: I have personally reviewed.  Sinus rhythm, QTC 448, occasional PAC  Assessment/Plan Principal Problem:   Acute metabolic encephalopathy Active Problems:   HLD (hyperlipidemia)   HTN (hypertension)   Hypothyroidism   Hyponatremia   Hypertensive urgency   UTI (urinary tract infection)   Acute metabolic encephalopathy: Likely multifactorial etiology, including hypertensive urgency, hyponatremia, UTI.  CT head negative for acute intracranial abnormalities.  No focal neuro deficit on physical examination. -Placed on MedSurg bed for observation -Frequent neuro check -Treat underlying issues as below  UTI: Patient was started on on cefuroxime 10/01/20, urinalysis negative, but patient still has burning on urination. -Will continue to treat with Rocephin -Follow-up urine culture  HLD (hyperlipidemia) -Pravastatin  HTN (hypertension) and hypertensive urgency: Blood pressure 207/92, 182/91 -continue home Atenolol -add amlodipine 5 mg daily -IV hydralazine as needed  Hypothyroidism: not taking meds now -f/u TSH  Hyponatremia: Na 124. Likely due to poor oral intake and dehydration.  Patient received a 500 cc of LR in the ED, bilateral repeat sodium level is still 124. - Will check urine sodium, urine  osmolality, serum osmolality. -Fluid restriction - check TSH - will give another 500 cc of NS, then 50 mL/h - f/u by BMP q8h - avoid over correction too fast due to risk of central pontine myelinolysis    DVT ppx: SQ Lovenox Code Status: Full code Family Communication: I have tried to call her husband without success. Disposition Plan:  Anticipate discharge back to previous environment Consults called: None Admission status and Level of care: Med-Surg:    for obs   Status is: Observation  The patient remains OBS appropriate and will d/c before 2 midnights.  Dispo: The patient is from: ALF  Anticipated d/c is to: ALF              Patient currently is not medically stable to d/c.   Difficult to place patient No          Date of Service 10/04/2020    [facility]   If 7PM-7AM, please contact night-coverage [email or web address] 10/04/2020, 4:28 PM

## 2020-10-05 DIAGNOSIS — Z9114 Patient's other noncompliance with medication regimen: Secondary | ICD-10-CM | POA: Diagnosis not present

## 2020-10-05 DIAGNOSIS — E785 Hyperlipidemia, unspecified: Secondary | ICD-10-CM | POA: Diagnosis present

## 2020-10-05 DIAGNOSIS — R410 Disorientation, unspecified: Secondary | ICD-10-CM | POA: Diagnosis not present

## 2020-10-05 DIAGNOSIS — R5381 Other malaise: Secondary | ICD-10-CM | POA: Diagnosis present

## 2020-10-05 DIAGNOSIS — R4182 Altered mental status, unspecified: Secondary | ICD-10-CM | POA: Diagnosis not present

## 2020-10-05 DIAGNOSIS — E860 Dehydration: Secondary | ICD-10-CM | POA: Diagnosis present

## 2020-10-05 DIAGNOSIS — I160 Hypertensive urgency: Secondary | ICD-10-CM | POA: Diagnosis present

## 2020-10-05 DIAGNOSIS — Z5329 Procedure and treatment not carried out because of patient's decision for other reasons: Secondary | ICD-10-CM | POA: Diagnosis not present

## 2020-10-05 DIAGNOSIS — I10 Essential (primary) hypertension: Secondary | ICD-10-CM | POA: Diagnosis present

## 2020-10-05 DIAGNOSIS — Z820 Family history of epilepsy and other diseases of the nervous system: Secondary | ICD-10-CM | POA: Diagnosis not present

## 2020-10-05 DIAGNOSIS — G9340 Encephalopathy, unspecified: Secondary | ICD-10-CM | POA: Diagnosis not present

## 2020-10-05 DIAGNOSIS — Z961 Presence of intraocular lens: Secondary | ICD-10-CM | POA: Diagnosis present

## 2020-10-05 DIAGNOSIS — Z8249 Family history of ischemic heart disease and other diseases of the circulatory system: Secondary | ICD-10-CM | POA: Diagnosis not present

## 2020-10-05 DIAGNOSIS — Z79899 Other long term (current) drug therapy: Secondary | ICD-10-CM | POA: Diagnosis not present

## 2020-10-05 DIAGNOSIS — G9341 Metabolic encephalopathy: Secondary | ICD-10-CM | POA: Diagnosis present

## 2020-10-05 DIAGNOSIS — Z825 Family history of asthma and other chronic lower respiratory diseases: Secondary | ICD-10-CM | POA: Diagnosis not present

## 2020-10-05 DIAGNOSIS — E871 Hypo-osmolality and hyponatremia: Secondary | ICD-10-CM | POA: Diagnosis present

## 2020-10-05 DIAGNOSIS — Z881 Allergy status to other antibiotic agents status: Secondary | ICD-10-CM | POA: Diagnosis not present

## 2020-10-05 DIAGNOSIS — E43 Unspecified severe protein-calorie malnutrition: Secondary | ICD-10-CM | POA: Diagnosis present

## 2020-10-05 DIAGNOSIS — Z681 Body mass index (BMI) 19 or less, adult: Secondary | ICD-10-CM | POA: Diagnosis not present

## 2020-10-05 DIAGNOSIS — Z87891 Personal history of nicotine dependence: Secondary | ICD-10-CM | POA: Diagnosis not present

## 2020-10-05 DIAGNOSIS — F323 Major depressive disorder, single episode, severe with psychotic features: Secondary | ICD-10-CM | POA: Diagnosis present

## 2020-10-05 DIAGNOSIS — Z9842 Cataract extraction status, left eye: Secondary | ICD-10-CM | POA: Diagnosis not present

## 2020-10-05 DIAGNOSIS — Z85828 Personal history of other malignant neoplasm of skin: Secondary | ICD-10-CM | POA: Diagnosis not present

## 2020-10-05 DIAGNOSIS — Z9841 Cataract extraction status, right eye: Secondary | ICD-10-CM | POA: Diagnosis not present

## 2020-10-05 DIAGNOSIS — Z20822 Contact with and (suspected) exposure to covid-19: Secondary | ICD-10-CM | POA: Diagnosis present

## 2020-10-05 DIAGNOSIS — E222 Syndrome of inappropriate secretion of antidiuretic hormone: Secondary | ICD-10-CM | POA: Diagnosis present

## 2020-10-05 DIAGNOSIS — N390 Urinary tract infection, site not specified: Secondary | ICD-10-CM | POA: Diagnosis present

## 2020-10-05 LAB — CBC
HCT: 35.2 % — ABNORMAL LOW (ref 36.0–46.0)
Hemoglobin: 12.3 g/dL (ref 12.0–15.0)
MCH: 32.8 pg (ref 26.0–34.0)
MCHC: 34.9 g/dL (ref 30.0–36.0)
MCV: 93.9 fL (ref 80.0–100.0)
Platelets: 282 10*3/uL (ref 150–400)
RBC: 3.75 MIL/uL — ABNORMAL LOW (ref 3.87–5.11)
RDW: 11.8 % (ref 11.5–15.5)
WBC: 7.7 10*3/uL (ref 4.0–10.5)
nRBC: 0 % (ref 0.0–0.2)

## 2020-10-05 LAB — BASIC METABOLIC PANEL
Anion gap: 6 (ref 5–15)
Anion gap: 8 (ref 5–15)
BUN: 14 mg/dL (ref 8–23)
BUN: 15 mg/dL (ref 8–23)
CO2: 28 mmol/L (ref 22–32)
CO2: 28 mmol/L (ref 22–32)
Calcium: 8.3 mg/dL — ABNORMAL LOW (ref 8.9–10.3)
Calcium: 8.9 mg/dL (ref 8.9–10.3)
Chloride: 92 mmol/L — ABNORMAL LOW (ref 98–111)
Chloride: 95 mmol/L — ABNORMAL LOW (ref 98–111)
Creatinine, Ser: 0.59 mg/dL (ref 0.44–1.00)
Creatinine, Ser: 0.63 mg/dL (ref 0.44–1.00)
GFR, Estimated: >60 mL/min (ref >60)
GFR, Estimated: >60 mL/min (ref >60)
Glucose, Bld: 87 mg/dL (ref 70–99)
Glucose, Bld: 91 mg/dL (ref 70–99)
Potassium: 3.4 mmol/L — ABNORMAL LOW (ref 3.5–5.1)
Potassium: 3.7 mmol/L (ref 3.5–5.1)
Sodium: 128 mmol/L — ABNORMAL LOW (ref 135–145)
Sodium: 129 mmol/L — ABNORMAL LOW (ref 135–145)

## 2020-10-05 LAB — URINE CULTURE: Culture: NO GROWTH

## 2020-10-05 LAB — GLUCOSE, CAPILLARY: Glucose-Capillary: 72 mg/dL (ref 70–99)

## 2020-10-05 NOTE — Evaluation (Signed)
Physical Therapy Evaluation Patient Details Name: Angela Mason MRN: [id number] DOB: 1933-06-18 Today's Date: 10/05/2020   History of Present Illness  87 y.o. female with medical history significant of hypertension, hyperlipidemia, COPD, hypothyroidism, cystocele, who presents with altered mental status.   Per report, and her normal baseline, patient is independent, normally alert, oriented, and can drive car. Pt was noted to be confused in the past severe days, with paranoia. She is not acting like herself.    She has poor appetite and decreased oral intake. She was diagnosed with UTI on 3/7 and started on cefuroxime.  Clinical Impression  Patient received in bed agreeable to PT assessment. IV beeping, then noted IV line leaking. RN called to room. Patient also found to be wet in bed. Gown and bed pad changed, shoes and socks donned with assistance while waiting for RN. Patient performed bed mobility with supervision, transfers with min guard. Ambulated with single hand held assist initially, then no UE assist. 150 feet. Patient is unsteady with mobility and will benefit from continued skilled PT while here to improve balance and strength for safe return home.        Follow Up Recommendations Home health PT    Equipment Recommendations  Other (comment) (TBD, maybe cane or walker)    Recommendations for Other Services       Precautions / Restrictions Precautions Precautions: Fall Restrictions Weight Bearing Restrictions: No      Mobility  Bed Mobility Overal bed mobility: Needs Assistance Bed Mobility: Supine to Sit;Sit to Supine     Supine to sit: Supervision Sit to supine: Supervision   General bed mobility comments: supervision for safety    Transfers Overall transfer level: Needs assistance Equipment used: 1 person hand held assist Transfers: Sit to/from Stand Sit to Stand: Supervision Stand pivot transfers: Supervision;Min guard       General transfer comment: Pt  standing and ambulating while holding onto IV pole. Pt initially min guard progressing to supervision.  Ambulation/Gait Ambulation/Gait assistance: Min assist Gait Distance (Feet): 150 Feet Assistive device: 1 person hand held assist;None Gait Pattern/deviations: Step-through pattern;Wide base of support;Decreased step length - right;Decreased step length - left Gait velocity: WN:   General Gait Details: Patient ambulated initially with single hand held assist, unsteady, then without UE assist. Balance improved as gait distance improved. Wide BOS  Stairs            Wheelchair Mobility    Modified Rankin (Stroke Patients Only)       Balance Overall balance assessment: Needs assistance Sitting-balance support: Feet supported Sitting balance-Leahy Scale: Good     Standing balance support: Single extremity supported;During functional activity Standing balance-Leahy Scale: Fair Standing balance comment: initially unsteady. Improved with increased distance.                             Pertinent Vitals/Pain Pain Assessment: No/denies pain    Home Living Family/patient expects to be discharged to:: Private residence Living Arrangements: Spouse/significant other Available Help at Discharge: Family;Available 24 hours/day Type of Home: Independent living facility Home Access: Level entry     Home Layout: One level Home Equipment: Bedside commode;Shower seat Additional Comments: Pt reports her husband is currently in rehab after a fall.    Prior Function Level of Independence: Independent         Comments: per pt report, she drives, performs ADLs, and performs IADLs without AD.     Hand Dominance  Dominant Hand: Right    Extremity/Trunk Assessment   Upper Extremity Assessment Upper Extremity Assessment: Defer to OT evaluation    Lower Extremity Assessment Lower Extremity Assessment: Generalized weakness    Cervical / Trunk  Assessment Cervical / Trunk Assessment: Normal  Communication   Communication: No difficulties  Cognition Arousal/Alertness: Awake/alert Behavior During Therapy: Impulsive Overall Cognitive Status: Within Functional Limits for tasks assessed                                 General Comments: Pt is A & O x 4 but needing cuing for safety awareness and redirection to task. Pt is very verbose.      General Comments      Exercises     Assessment/Plan    PT Assessment Patient needs continued PT services  PT Problem List Decreased strength;Decreased balance;Decreased mobility;Decreased safety awareness       PT Treatment Interventions DME instruction;Therapeutic exercise;Gait training;Balance training;Functional mobility training;Therapeutic activities;Patient/family education    PT Goals (Current goals can be found in the Care Plan section)  Acute Rehab PT Goals Patient Stated Goal: to go home PT Goal Formulation: With patient Time For Goal Achievement: 10/19/20 Potential to Achieve Goals: Good    Frequency Min 2X/week   Barriers to discharge Decreased caregiver support      Co-evaluation               AM-PAC PT "6 Clicks" Mobility  Outcome Measure Help needed turning from your back to your side while in a flat bed without using bedrails?: None Help needed moving from lying on your back to sitting on the side of a flat bed without using bedrails?: None Help needed moving to and from a bed to a chair (including a wheelchair)?: A Little Help needed standing up from a chair using your arms (e.g., wheelchair or bedside chair)?: A Little Help needed to walk in hospital room?: A Little Help needed climbing 3-5 steps with a railing? : A Little 6 Click Score: 20    End of Session Equipment Utilized During Treatment: Gait belt Activity Tolerance: Patient tolerated treatment well Patient left: in bed;with call bell/phone within reach;with bed alarm set Nurse  Communication: Mobility status PT Visit Diagnosis: Unsteadiness on feet (R26.81);Muscle weakness (generalized) (M62.81)    Time: 9163-8466 PT Time Calculation (min) (ACUTE ONLY): 41 min   Charges:   PT Evaluation $PT Eval Moderate Complexity: 1 Mod PT Treatments $Gait Training: 8-22 mins $Therapeutic Activity: 8-22 mins        Angela Bacigalupi, PT, GCS 10/05/20,3:57 PM

## 2020-10-05 NOTE — Progress Notes (Signed)
Progress Note    Angela Mason  OZD:[id number] DOB: Jun 04, 1933  DOA: 10/04/2020 PCP: Albina Billet, MD    Brief Narrative:     Medical records reviewed and are as summarized below:  Angela Mason is an 87 y.o. female with medical history significant of hypertension, hyperlipidemia, COPD, hypothyroidism, cystocele, who presents with altered mental status.  Per report, and her normal baseline, patient is independent, normally alert, oriented, and can drive car. Pt was noted to be confused in the past severe days, with paranoia. She is not acting like herself.   She has poor appetite and decreased oral intake. She was diagnosed with UTI on 3/7 and started on cefuroxime.   Assessment/Plan:   Principal Problem:   Acute metabolic encephalopathy Active Problems:   HLD (hyperlipidemia)   HTN (hypertension)   Hypothyroidism   Hyponatremia   Hypertensive urgency   UTI (urinary tract infection)   AMS (altered mental status)   Acute metabolic encephalopathy: Likely multifactorial etiology, including hypertensive urgency, hyponatremia, UTI, possible PRES syndrome  CT head negative for acute intracranial abnormalities.  No focal neuro deficit on physical examination. -Treat underlying issues as below -? PRES as BP was high when patient arrived in the hospital  UTI: Patient was started on on cefuroxime 10/01/20, urinalysis negative, but patient still has burning on urination. -Will continue to treat with Rocephin for total of 5 days -culture negative as treated outpatient  HLD (hyperlipidemia) -Pravastatin  HTN (hypertension) and hypertensive urgency: Blood pressure 207/92, 182/91 -continue home Atenolol -add amlodipine 5 mg daily -IV hydralazine as needed  H/o Hypothyroidism: not taking meds now - TSH normal  Hyponatremia: Na 124. Likely due to poor oral intake and dehydration.  -slowly improving with IVF -labs in AM   Family Communication/Anticipated D/C date and  plan/Code Status   DVT prophylaxis: Lovenox ordered. Code Status: Full Code.  Family Communication: called home number- no answer (no number for daughter) Disposition Plan: Status is: Inpatient  Remains inpatient appropriate because:Inpatient level of care appropriate due to severity of illness   Dispo: The patient is from: Home              Anticipated d/c is to: Home              Patient currently is not medically stable to d/c. still not at baseline   Difficult to place patient No         Medical Consultants:    None.    Subjective:   Says she has not been eating or drinking at home  Objective:    Vitals:   10/04/20 2314 10/05/20 0315 10/05/20 0758 10/05/20 1109  BP: (!) 122/48 118/71 (!) 157/57 (!) 156/70  Pulse: (!) 59 (!) 53 (!) 58 62  Resp: 20 18 18 16   Temp: 98 F (36.7 C) 97.7 F (36.5 C) 97.8 F (36.6 C) 97.7 F (36.5 C)  TempSrc:  Oral Oral Oral  SpO2: 98% 99% 100% 98%  Weight:        Intake/Output Summary (Last 24 hours) at 10/05/2020 1228 Last data filed at 10/05/2020 1102 Gross per 24 hour  Intake 740 ml  Output --  Net 740 ml   Filed Weights   10/04/20 1640  Weight: 36.7 kg    Exam:  General: Appearance:    Thin female in no acute distress     Lungs:     Clear to auscultation bilaterally, respirations unlabored  Heart:    Normal heart rate.  No LE edema.   MS:   All extremities are intact.   Neurologic:   Awake, alert, oriented x to person, place and time but not to situation-- thinks her husband is in the hospital (he is in the SNF portion of their ILF)     Data Reviewed:   I have personally reviewed following labs and imaging studies:  Labs: Labs show the following:   Basic Metabolic Panel: Recent Labs  Lab 10/04/20 1019 10/04/20 1518 10/04/20 2334 10/05/20 0710  NA 124* 124* 128* 129*  K 3.7 4.2 3.4* 3.7  CL 87* 86* 92* 95*  CO2 27 28 28 28   GLUCOSE 123* 107* 87 91  BUN 11 9 15 14   CREATININE 0.53 0.41* 0.63  0.59  CALCIUM 8.9 9.0 8.3* 8.9   GFR Estimated Creatinine Clearance: 29.8 mL/min (by C-G formula based on SCr of 0.59 mg/dL). Liver Function Tests: Recent Labs  Lab 10/04/20 1019  AST 24  ALT 20  ALKPHOS 58  BILITOT 0.9  PROT 6.7  ALBUMIN 3.9   No results for input(s): LIPASE, AMYLASE in the last 168 hours. No results for input(s): AMMONIA in the last 168 hours. Coagulation profile No results for input(s): INR, PROTIME in the last 168 hours.  CBC: Recent Labs  Lab 10/04/20 1019 10/05/20 0709  WBC 6.4 7.7  HGB 12.4 12.3  HCT 36.1 35.2*  MCV 93.0 93.9  PLT 273 282   Cardiac Enzymes: No results for input(s): CKTOTAL, CKMB, CKMBINDEX, TROPONINI in the last 168 hours. BNP (last 3 results) No results for input(s): PROBNP in the last 8760 hours. CBG: Recent Labs  Lab 10/05/20 0739  GLUCAP 72   D-Dimer: No results for input(s): DDIMER in the last 72 hours. Hgb A1c: No results for input(s): HGBA1C in the last 72 hours. Lipid Profile: No results for input(s): CHOL, HDL, LDLCALC, TRIG, CHOLHDL, LDLDIRECT in the last 72 hours. Thyroid function studies: Recent Labs    10/04/20 1518  TSH 0.657   Anemia work up: No results for input(s): VITAMINB12, FOLATE, FERRITIN, TIBC, IRON, RETICCTPCT in the last 72 hours. Sepsis Labs: Recent Labs  Lab 10/04/20 1019 10/05/20 0709  WBC 6.4 7.7    Microbiology Recent Results (from the past 240 hour(s))  Urine culture     Status: None   Collection Time: 10/04/20 12:30 PM   Specimen: Urine, Random  Result Value Ref Range Status   Specimen Description   Final    URINE, RANDOM Performed at [facility], [address] [place], [place] [address]    Special Requests   Final    NONE Performed at [facility], [address], [place], [place] [address]    Culture   Final    NO GROWTH Performed at [facility], [place] [address] [place], [place] [address]    Report Status 10/05/2020 FINAL  Final   Resp Panel by RT-PCR (Flu A&B, Covid) Nasopharyngeal Swab     Status: None   Collection Time: 10/04/20  2:22 PM   Specimen: Nasopharyngeal Swab; Nasopharyngeal(NP) swabs in vial transport medium  Result Value Ref Range Status   SARS Coronavirus 2 by RT PCR NEGATIVE NEGATIVE Final    Comment: (NOTE) SARS-CoV-2 target nucleic acids are NOT DETECTED.  The SARS-CoV-2 RNA is generally detectable in upper respiratory specimens during the acute phase of infection. The lowest concentration of SARS-CoV-2 viral copies this assay can detect is 138 copies/mL. A negative result does not preclude SARS-Cov-2 infection and  should not be used as the sole basis for treatment or other patient management decisions. A negative result may occur with  improper specimen collection/handling, submission of specimen other than nasopharyngeal swab, presence of viral mutation(s) within the areas targeted by this assay, and inadequate number of viral copies(<138 copies/mL). A negative result must be combined with clinical observations, patient history, and epidemiological information. The expected result is Negative.  Fact Sheet for Patients:  EntrepreneurPulse.com.au  Fact Sheet for Healthcare Providers:  IncredibleEmployment.be  This test is no t yet approved or cleared by the [place] FDA and  has been authorized for detection and/or diagnosis of SARS-CoV-2 by FDA under an Emergency Use Authorization (EUA). This EUA will remain  in effect (meaning this test can be used) for the duration of the COVID-19 declaration under Section 564(b)(1) of the Act, 21 U.S.C.section 360bbb-3(b)(1), unless the authorization is terminated  or revoked sooner.       Influenza A by PCR NEGATIVE NEGATIVE Final   Influenza B by PCR NEGATIVE NEGATIVE Final    Comment: (NOTE) The Xpert Xpress SARS-CoV-2/FLU/RSV plus assay is intended as an aid in the diagnosis of influenza from  Nasopharyngeal swab specimens and should not be used as a sole basis for treatment. Nasal washings and aspirates are unacceptable for Xpert Xpress SARS-CoV-2/FLU/RSV testing.  Fact Sheet for Patients: EntrepreneurPulse.com.au  Fact Sheet for Healthcare Providers: IncredibleEmployment.be  This test is not yet approved or cleared by the [place] FDA and has been authorized for detection and/or diagnosis of SARS-CoV-2 by FDA under an Emergency Use Authorization (EUA). This EUA will remain in effect (meaning this test can be used) for the duration of the COVID-19 declaration under Section 564(b)(1) of the Act, 21 U.S.C. section 360bbb-3(b)(1), unless the authorization is terminated or revoked.  Performed at [facility], [address] [place], [place] [address]     Procedures and diagnostic studies:  CT Head Wo Contrast  Result Date: 10/04/2020 CLINICAL DATA:  Confusion/delirium EXAM: CT HEAD WITHOUT CONTRAST TECHNIQUE: Contiguous axial images were obtained from the base of the skull through the vertex without intravenous contrast. COMPARISON:  None. FINDINGS: Brain: Mild to moderate diffuse atrophy is present. There is no intracranial mass, hemorrhage extra-axial fluid collection, or midline shift. There is mild decreased attenuation in the centra semiovale bilaterally. Elsewhere brain parenchyma appears unremarkable. No acute infarct evident. Vascular: No hyperdense vessel. There are foci of calcification in the carotid siphon regions. Skull: The bony calvarium appears intact. Sinuses/Orbits: Visualized paranasal sinuses are clear. Visualized orbits appear symmetric bilaterally. Other: Visualized mastoid air cells are clear. IMPRESSION: Atrophy with slight periventricular small vessel disease. No acute infarct. No mass or hemorrhage. There are foci of arterial vascular calcification. Electronically Signed   By: Lowella Grip III M.D.    On: 10/04/2020 13:18   DG Chest Portable 1 View  Result Date: 10/04/2020 CLINICAL DATA:  Delirium/confusion EXAM: PORTABLE CHEST 1 VIEW COMPARISON:  None. FINDINGS: Lungs are clear. Heart size and pulmonary vascularity are normal. No adenopathy. There is aortic atherosclerosis. No bone lesions. IMPRESSION: Lungs clear. Heart size normal. Aortic Atherosclerosis (ICD10-I70.0). Electronically Signed   By: Lowella Grip III M.D.   On: 10/04/2020 13:19    Medications:   . amLODipine  5 mg Oral Daily  . atenolol  25 mg Oral QODAY  . calcium-vitamin D  1 tablet Oral BID  . enoxaparin (LOVENOX) injection  30 mg Subcutaneous Q24H  . multivitamin-lutein  1 capsule Oral Daily  .  omega-3 acid ethyl esters  1 g Oral Daily  . pravastatin  20 mg Oral q1800   Continuous Infusions: . sodium chloride 50 mL/hr at 10/04/20 1815  . cefTRIAXone (ROCEPHIN)  IV 1 g (10/04/20 2011)     LOS: 0 days   Geradine Girt  [facility]   How to contact the [facility] Attending or Consulting provider [place] or covering provider during after hours [place], for this patient?  1. Check the care team in [facility] and look for a) attending/consulting [facility] provider listed and b) the [facility] team listed 2. Log into [email or web address] and use [facility]'s universal password to access. If you do not have the password, please contact the hospital operator. 3. Locate the [facility] provider you are looking for under [facility] and page to a number that you can be directly reached. 4. If you still have difficulty reaching the provider, please page the [facility] (Director on Call) for the Hospitalists listed on amion for assistance.  10/05/2020, 12:28 PM

## 2020-10-05 NOTE — Evaluation (Signed)
Occupational Therapy Evaluation Patient Details Name: Angela Mason MRN: [id number] DOB: 11/23/1932 Today's Date: 10/05/2020    History of Present Illness 87 y.o. female with medical history significant of hypertension, hyperlipidemia, COPD, hypothyroidism, cystocele, who presents with altered mental status.   Per report, and her normal baseline, patient is independent, normally alert, oriented, and can drive car. Pt was noted to be confused in the past severe days, with paranoia. She is not acting like herself.    She has poor appetite and decreased oral intake. She was diagnosed with UTI on 3/7 and started on cefuroxime.   Clinical Impression   Patient presenting with decreased I in self care, balance, functional mobility/transfers, endurance, and safety awareness.  Patient reports living in an ILF with husband PTA. Pt reports she does not use AD for ambulation, drives, and performs all ADLS and IADLs without assistance. Pt does endorse husband is currently in rehab. Patient currently functioning at supervision - min guard with functional mobility and self care tasks with min cuing for safety awareness and technique. Pt was alert and oriented during session.  Patient will benefit from acute OT to increase overall independence in the areas of ADLs, functional mobility, and safety awareness in order to safely discharge home.    Follow Up Recommendations  Home health OT;Supervision - Intermittent    Equipment Recommendations  Other (comment) (RW)       Precautions / Restrictions Precautions Precautions: Fall      Mobility Bed Mobility Overal bed mobility: Needs Assistance Bed Mobility: Supine to Sit;Sit to Supine     Supine to sit: Supervision Sit to supine: Supervision   General bed mobility comments: supervision for safety with min cuing for technique as well    Transfers Overall transfer level: Needs assistance   Transfers: Sit to/from Stand;Stand Pivot Transfers Sit to  Stand: Supervision Stand pivot transfers: Supervision;Min guard       General transfer comment: Pt standing and ambulating while holding onto IV pole. Pt initially min guard progressing to supervision.    Balance Overall balance assessment: Needs assistance Sitting-balance support: Feet supported Sitting balance-Leahy Scale: Good     Standing balance support: During functional activity Standing balance-Leahy Scale: Fair                             ADL either performed or assessed with clinical judgement   ADL Overall ADL's : Needs assistance/impaired Eating/Feeding: Independent   Grooming: Wash/dry hands;Wash/dry face;Oral care;Standing;Supervision/safety                   Toilet Transfer: Supervision/safety           Functional mobility during ADLs: Supervision/safety;Min guard General ADL Comments: Pt ambulating while holding onto IV pole with min guard progressing to supervison overall. Vital signs remain stable throughout. S - min guard for self care tasks in standing.     Vision Patient Visual Report: No change from baseline              Pertinent Vitals/Pain Pain Assessment: No/denies pain     Hand Dominance Right   Extremity/Trunk Assessment Upper Extremity Assessment Upper Extremity Assessment: Overall WFL for tasks assessed   Lower Extremity Assessment Lower Extremity Assessment: Defer to PT evaluation       Communication Communication Communication: No difficulties   Cognition Arousal/Alertness: Awake/alert Behavior During Therapy: Impulsive Overall Cognitive Status: Within Functional Limits for tasks assessed  General Comments: Pt is A & O x 4 but needing cuing for safety awareness and redirection to task. Pt is very verbose.              Home Living Family/patient expects to be discharged to:: Private residence Living Arrangements: Spouse/significant other   Type of  Home: Independent living facility Home Access: Level entry     [place]: One level     Bathroom Shower/Tub: Walk-in shower         Home Equipment: Bedside commode;Shower seat   Additional Comments: Pt reports her husband is currently in rehab after a fall.      Prior Functioning/Environment Level of Independence: Independent        Comments: per pt report, she drives, performs ADLs, and performs IADLs without AD.        OT Problem List: Decreased strength;Decreased activity tolerance;Impaired balance (sitting and/or standing);Decreased cognition;Decreased safety awareness;Cardiopulmonary status limiting activity;Decreased knowledge of use of DME or AE      OT Treatment/Interventions: Self-care/ADL training;Manual therapy;Therapeutic exercise;Patient/family education;Energy conservation;DME and/or AE instruction;Therapeutic activities;Balance training    OT Goals(Current goals can be found in the care plan section) Acute Rehab OT Goals Patient Stated Goal: to go home OT Goal Formulation: With patient Time For Goal Achievement: 10/19/20 Potential to Achieve Goals: Good ADL Goals Pt Will Perform Grooming: with modified independence;standing Pt Will Perform Lower Body Dressing: with modified independence;sit to/from stand Pt Will Transfer to Toilet: with modified independence;ambulating Pt Will Perform Toileting - Clothing Manipulation and hygiene: with modified independence;sit to/from stand  OT Frequency: Min 2X/week   Barriers to D/C:    none known at this time          AM-PAC OT "6 Clicks" Daily Activity     Outcome Measure Help from another person eating meals?: None Help from another person taking care of personal grooming?: A Little Help from another person toileting, which includes using toliet, bedpan, or urinal?: A Little Help from another person bathing (including washing, rinsing, drying)?: A Little Help from another person to put on and taking off  regular upper body clothing?: None Help from another person to put on and taking off regular lower body clothing?: A Little 6 Click Score: 20   End of Session Nurse Communication: Mobility status  Activity Tolerance: Patient tolerated treatment well Patient left: in bed;with call bell/phone within reach;with bed alarm set  OT Visit Diagnosis: Unsteadiness on feet (R26.81);Muscle weakness (generalized) (M62.81);Repeated falls (R29.6)                Time: 7939-0300 OT Time Calculation (min): 24 min Charges:  OT General Charges $OT Visit: 1 Visit OT Evaluation $OT Eval Low Complexity: 1 Low OT Treatments $Self Care/Home Management : 8-22 mins  Darleen Crocker, MS, OTR/L , CBIS ascom [phone]  10/05/20, 3:21 PM

## 2020-10-06 DIAGNOSIS — G9341 Metabolic encephalopathy: Secondary | ICD-10-CM | POA: Diagnosis not present

## 2020-10-06 DIAGNOSIS — I10 Essential (primary) hypertension: Secondary | ICD-10-CM | POA: Diagnosis not present

## 2020-10-06 LAB — AMMONIA: Ammonia: <9 umol/L — ABNORMAL LOW (ref 9–35)

## 2020-10-06 LAB — BASIC METABOLIC PANEL
Anion gap: 7 (ref 5–15)
BUN: 12 mg/dL (ref 8–23)
CO2: 29 mmol/L (ref 22–32)
Calcium: 9.1 mg/dL (ref 8.9–10.3)
Chloride: 96 mmol/L — ABNORMAL LOW (ref 98–111)
Creatinine, Ser: 0.51 mg/dL (ref 0.44–1.00)
GFR, Estimated: >60 mL/min (ref >60)
Glucose, Bld: 123 mg/dL — ABNORMAL HIGH (ref 70–99)
Potassium: 4 mmol/L (ref 3.5–5.1)
Sodium: 132 mmol/L — ABNORMAL LOW (ref 135–145)

## 2020-10-06 LAB — GLUCOSE, CAPILLARY: Glucose-Capillary: 125 mg/dL — ABNORMAL HIGH (ref 70–99)

## 2020-10-06 LAB — VITAMIN B12: Vitamin B-12: 355 pg/mL (ref 180–914)

## 2020-10-06 NOTE — Progress Notes (Signed)
Lab came to draw the patient's blood. The patient snatched the butterfly needle out of her hand and refused to let it go. The tech and I were able to talk the patient into to giving [place] the needle. Lab will come back later this morning to draw the patient's blood.

## 2020-10-06 NOTE — Consult Note (Signed)
[facility] Neurology Consultation  Reason for Consult: AMS Referring Physician: Dr Princella Ion, [facility]  CC: AMS  History is obtained from: patient and chart.   HPI: Angela Mason is a 87 y.o. female with PMH of HTN, HLD, who was brought in for evaluation of increasing confusion.  She lives in a independent living facility where she also takes care of her husband who was recently been moved to skilled nursing.  She has a daughter who lives in [place]. Patient takes care of her husband.  According to her, her husband is a wonderful takes care of bills and finances and they have someone else who helps them out with the bills and finances. She has not noticed any issues with her memory. She has been increasingly getting confused and has been getting skeptical of all the treatment has been provided to her.  She came in with extremely high blood pressures and has been refusing blood pressure medications. Neurology was consulted for evaluation of this increasing confusion and for further recommendations on work-up. During my interview with the patient, she did cooperate with the exam but she would only give me very short answers to all questions that I asked.  When asked her to take her medications which the RN was giving her, she said she does not take more than 1 medication for her blood pressure and does not require 1.  She appeared rather stern and at times mildly confrontational in her dealings with her care team.  She was diagnosed with a UTI on 10/01/2020 and was started on cefuroxime.  Since that time, she has been more confused and paranoid and not acting like herself.  She has been more forgetful.  She also has had a diminished appetite.  The ED note says that the patient's nurse from the facility also noted that the patient had been having a hard time with the stressors of her husband's declining health.  The facility was not sure whether she had been taking the antibiotics that she had been  prescribed. Her urinalysis in the emergency room was negative for an acute UTI.  She was noted to be hyponatremic-sodium 124.  Prior to that labs from 2021 had her sodium normal at 138. Her blood pressure was systolic 326Z in the ER the ER as high up as 180 at this time.  ROS: Obtained and negative except as noted in HPI.  Past Medical History:  Diagnosis Date  . Cancer ([place])    SKIN  . COPD, mild ([place])   . Dyspnea   . Edema   . HLD (hyperlipidemia)   . HTN (hypertension)   . Hypothyroidism   . Macular degeneration, bilateral   . Varicosities     Family History  Problem Relation Age of Onset  . COPD Father   . Heart failure Father   . Diverticulitis Mother   . Macular degeneration Mother   . Epilepsy Sister   . Parkinson's disease Brother   . Cancer Neg Hx   . Diabetes Neg Hx   . Breast cancer Neg Hx    Social History:   reports that she quit smoking about 15 years ago. She has never used smokeless tobacco. She reports current alcohol use. She reports that she does not use drugs.  Medications  Current Facility-Administered Medications:  .  acetaminophen (TYLENOL) suppository 650 mg, 650 mg, Rectal, Q6H PRN, Ivor Costa, MD .  acetaminophen (TYLENOL) tablet 650 mg, 650 mg, Oral, Q6H PRN, Ivor Costa, MD .  albuterol (  VENTOLIN HFA) 108 (90 Base) MCG/ACT inhaler 2 puff, 2 puff, Inhalation, Q4H PRN, Ivor Costa, MD .  amLODipine (NORVASC) tablet 5 mg, 5 mg, Oral, Daily, Ivor Costa, MD, 5 mg at 10/06/20 0933 .  atenolol (TENORMIN) tablet 25 mg, 25 mg, Oral, Henreitta Cea, MD, 25 mg at 10/06/20 0934 .  calcium-vitamin D (OSCAL WITH D) 500-200 MG-UNIT per tablet 1 tablet, 1 tablet, Oral, BID, Ivor Costa, MD, 1 tablet at 10/06/20 0933 .  cefTRIAXone (ROCEPHIN) 1 g in sodium chloride 0.9 % 100 mL IVPB, 1 g, Intravenous, Q24H, Vann, Jessica U, DO, Last Rate: 200 mL/hr at 10/05/20 1849, 1 g at 10/05/20 1849 .  enoxaparin (LOVENOX) injection 30 mg, 30 mg, Subcutaneous, Q24H, Ivor Costa, MD, 30 mg at 10/05/20 2200 .  hydrALAZINE (APRESOLINE) injection 5 mg, 5 mg, Intravenous, Q2H PRN, Ivor Costa, MD .  multivitamin-lutein (OCUVITE-LUTEIN) capsule 1 capsule, 1 capsule, Oral, Daily, Ivor Costa, MD, 1 capsule at 10/06/20 0935 .  naphazoline-glycerin (CLEAR EYES REDNESS) ophth solution 1 drop, 1 drop, Both Eyes, QID PRN, Ivor Costa, MD .  omega-3 acid ethyl esters (LOVAZA) capsule 1 g, 1 g, Oral, Daily, Ivor Costa, MD, 1 g at 10/06/20 0933 .  ondansetron (ZOFRAN) injection 4 mg, 4 mg, Intravenous, Q8H PRN, Ivor Costa, MD .  pravastatin (PRAVACHOL) tablet 20 mg, 20 mg, Oral, q1800, Ivor Costa, MD, 20 mg at 10/05/20 1908  Exam: Current vital signs: BP (!) 181/81 (BP Location: Left Arm)   Pulse 69   Temp 97.8 F (36.6 C) (Oral)   Resp 20   Wt 36.7 kg   SpO2 99%   BMI 16.60 kg/m  Vital signs in last 24 hours: Temp:  [97.5 F (36.4 C)-98.5 F (36.9 C)] 97.8 F (36.6 C) (03/12 0959) Pulse Rate:  [57-69] 69 (03/12 0959) Resp:  [16-20] 20 (03/12 0959) BP: (141-181)/(65-81) 181/81 (03/12 0959) SpO2:  [98 %-100 %] 99 % (03/12 0959) General: awake alert in no distress HEENT: [place] AT MMM CVS: RRR Chest clear to auscultation Abdomen: Nondistended nontender Extremities warm well perfused with no edema Neurological exam Awake alert oriented to self, the fact that she is in the hospital, could tell me the hospital, year and month.  Was able to tell me who the president is.  Had mildly reduced attention concentration. Was rather evasive of some questions. Some dealings with the care team were rather confrontational where she refused to comply with taking her medications. No dysarthria No aphasia Cranial nerves II to XII intact Motor exam with no motor deficits-5/5 all over Sensory exam with intact sensation and no extinction on double denies any stimulation Coordination with no dysmetria on finger-nose-finger testing Gait testing deferred at this time   Labs I have  reviewed labs in epic and the results pertinent to this consultation are:  CBC    Component Value Date/Time   WBC 7.7 10/05/2020 0709   RBC 3.75 (L) 10/05/2020 0709   HGB 12.3 10/05/2020 0709   HGB 14.1 06/04/2012 1008   HCT 35.2 (L) 10/05/2020 0709   HCT 42.4 06/04/2012 1008   PLT 282 10/05/2020 0709   PLT 263 06/04/2012 1008   MCV 93.9 10/05/2020 0709   MCV 95 06/04/2012 1008   MCH 32.8 10/05/2020 0709   MCHC 34.9 10/05/2020 0709   RDW 11.8 10/05/2020 0709   RDW 12.5 06/04/2012 1008   LYMPHSABS 1.3 06/03/2012 1437   MONOABS 0.5 06/03/2012 1437   EOSABS 0.0 06/03/2012 1437   BASOSABS  0.0 06/03/2012 1437    CMP     Component Value Date/Time   NA 132 (L) 10/06/2020 0751   NA 138 06/04/2012 1008   K 4.0 10/06/2020 0751   K 5.4 (H) 06/04/2012 1008   CL 96 (L) 10/06/2020 0751   CL 103 06/04/2012 1008   CO2 29 10/06/2020 0751   CO2 24 06/04/2012 1008   GLUCOSE 123 (H) 10/06/2020 0751   GLUCOSE 124 (H) 06/04/2012 1008   BUN 12 10/06/2020 0751   BUN 14 06/04/2012 1008   CREATININE 0.51 10/06/2020 0751   CREATININE 0.63 06/04/2012 1008   CALCIUM 9.1 10/06/2020 0751   CALCIUM 8.8 06/04/2012 1008   PROT 6.7 10/04/2020 1019   PROT 7.6 06/04/2012 1008   ALBUMIN 3.9 10/04/2020 1019   ALBUMIN 3.5 06/04/2012 1008   AST 24 10/04/2020 1019   AST 48 (H) 06/04/2012 1008   ALT 20 10/04/2020 1019   ALT 20 06/04/2012 1008   ALKPHOS 58 10/04/2020 1019   ALKPHOS 95 06/04/2012 1008   BILITOT 0.9 10/04/2020 1019   BILITOT 0.9 06/04/2012 1008   GFRNONAA >60 10/06/2020 0751   GFRNONAA >60 06/04/2012 1008   GFRAA 90 01/11/2020 1415   GFRAA >60 06/04/2012 1008   Imaging I have reviewed the images obtained:  CT-scan of the brain-atrophy with periventricular small vessel disease, no infarct, no mass or hemorrhage.  There are foci of arterial vascular calcification.   Assessment: 87 year old with above past medical history presenting for worsening confusion and paranoia.  Presumably  diagnosed with UTI on 10/02/2019 and was supposed to be on antibiotics-unclear if he is taking them. Labs in the emergency room noted significant for hyponatremia.  Urinalysis negative. Also noted to have high blood pressures on arrival with systolic in the 341D which are high to 180s now. At this time broad differential including metabolic encephalopathy from hyponatremia versus toxic encephalopathy from some sort of an underlying infection versus hypertensive urgency. Other differentials include cognitive decline with behavioral component.   Impression: Metabolic encephalopathy from hyponatremia Toxic encephalopathy from underlying UTI Hypertensive urgency-did not does not have any visual symptoms or other complaints but posterior reversible encephalopathy syndrome also is a possibility.  Recommendations: Brain MRI without contrast B12 TSH Ammonia Management of hyponatremia per primary team as you are Urinalysis not strongly suggestive of UTI-we will defer management to the primary team Measures for delirium avoidance-keep redirecting the patient, keep the window shades open, minimize interruptions to her sleep.  Discussed with Dr. Eliseo Squires We will follow  -- Amie Portland, MD Neurologist [facility] Pager: [phone]

## 2020-10-06 NOTE — Progress Notes (Signed)
Progress Note    Angela Mason  MGQ:[id number] DOB: [birth year]  DOA: 10/04/2020 PCP: Albina Billet, MD    Brief Narrative:     Medical records reviewed and are as summarized below:  Angela Mason is an 87 y.o. female with medical history significant of hypertension, hyperlipidemia, COPD, hypothyroidism, cystocele, who presents with altered mental status.  Per report, and her normal baseline, patient is independent, normally alert, oriented, and can drive car. Pt was noted to be confused in the past severe days, with paranoia. She is not acting like herself.   She has poor appetite and decreased oral intake. She was diagnosed with UTI on 3/7 and started on cefuroxime.   Assessment/Plan:   Principal Problem:   Acute metabolic encephalopathy Active Problems:   HLD (hyperlipidemia)   HTN (hypertension)   Hypothyroidism   Hyponatremia   Hypertensive urgency   UTI (urinary tract infection)   AMS (altered mental status)   Acute metabolic encephalopathy: Likely multifactorial etiology, including hypertensive urgency, hyponatremia, UTI, possible PRES syndrome  CT head negative for acute intracranial abnormalities.  No focal neuro deficit on physical examination. -on exam today she is much less cooperative, she is hostile as well as paranoid -Treat underlying issues as below -? PRES as BP was high when patient arrived in the hospital- MRI Pending -neurology consult appreciated  UTI: Patient was started on on cefuroxime 10/01/20, urinalysis negative, but patient still has burning on urination. -Will continue to treat with Rocephin for total of 5 days (including outpt abx) -culture negative as treated outpatient  HLD (hyperlipidemia) -Pravastatin  HTN (hypertension) and hypertensive urgency: Blood pressure 207/92, 182/91 -continue home Atenolol -add amlodipine 5 mg daily -IV hydralazine as needed  H/o Hypothyroidism: not taking meds now - TSH normal  Hyponatremia: Na  124. Likely due to poor oral intake and dehydration.  -slowly improving - responded to IVF    Family Communication/Anticipated D/C date and plan/Code Status   DVT prophylaxis: Lovenox ordered. Code Status: Full Code.  Family Communication: called home number- no answer; daughter for number is international and I was unable to complete via the phone system Disposition Plan: Status is: Inpatient  Remains inpatient appropriate because:Inpatient level of care appropriate due to severity of illness   Dispo: The patient is from: Home ILF              Anticipated d/c is to: Home              Patient currently is not medically stable to d/c. still not at baseline   Difficult to place patient No         Medical Consultants:    Neuro    Subjective:   Says she does not trust me and will not answer my questions  Objective:    Vitals:   10/05/20 2016 10/05/20 2309 10/06/20 0435 10/06/20 0959  BP: (!) 141/67 (!) 164/73 (!) 173/80 (!) 181/81  Pulse: 62 (!) 57 65 69  Resp: 20 20 18 20   Temp: 98.5 F (36.9 C) (!) 97.5 F (36.4 C) 98 F (36.7 C) 97.8 F (36.6 C)  TempSrc:  Oral Oral Oral  SpO2: 100% 99% 98% 99%  Weight:        Intake/Output Summary (Last 24 hours) at 10/06/2020 1210 Last data filed at 10/06/2020 0600 Gross per 24 hour  Intake 290 ml  Output 200 ml  Net 90 ml   Filed Weights   10/04/20 1640  Weight: 36.7 kg  Exam:  General: Appearance:    Thin female who appears angry      Lungs:     respirations unlabored  Heart:    Normal heart rate.   MS:   All extremities are intact.   Neurologic:   Awake, alert, refused to answer orientation questions     Data Reviewed:   I have personally reviewed following labs and imaging studies:  Labs: Labs show the following:   Basic Metabolic Panel: Recent Labs  Lab 10/04/20 1019 10/04/20 1518 10/04/20 2334 10/05/20 0710 10/06/20 0751  NA 124* 124* 128* 129* 132*  K 3.7 4.2 3.4* 3.7 4.0  CL 87*  86* 92* 95* 96*  CO2 27 28 28 28 29   GLUCOSE 123* 107* 87 91 123*  BUN 11 9 15 14 12   CREATININE 0.53 0.41* 0.63 0.59 0.51  CALCIUM 8.9 9.0 8.3* 8.9 9.1   GFR Estimated Creatinine Clearance: 29.8 mL/min (by C-G formula based on SCr of 0.51 mg/dL). Liver Function Tests: Recent Labs  Lab 10/04/20 1019  AST 24  ALT 20  ALKPHOS 58  BILITOT 0.9  PROT 6.7  ALBUMIN 3.9   No results for input(s): LIPASE, AMYLASE in the last 168 hours. Recent Labs  Lab 10/06/20 1009  AMMONIA <9*   Coagulation profile No results for input(s): INR, PROTIME in the last 168 hours.  CBC: Recent Labs  Lab 10/04/20 1019 10/05/20 0709  WBC 6.4 7.7  HGB 12.4 12.3  HCT 36.1 35.2*  MCV 93.0 93.9  PLT 273 282   Cardiac Enzymes: No results for input(s): CKTOTAL, CKMB, CKMBINDEX, TROPONINI in the last 168 hours. BNP (last 3 results) No results for input(s): PROBNP in the last 8760 hours. CBG: Recent Labs  Lab 10/05/20 0739 10/06/20 0750  GLUCAP 72 125*   D-Dimer: No results for input(s): DDIMER in the last 72 hours. Hgb A1c: No results for input(s): HGBA1C in the last 72 hours. Lipid Profile: No results for input(s): CHOL, HDL, LDLCALC, TRIG, CHOLHDL, LDLDIRECT in the last 72 hours. Thyroid function studies: Recent Labs    10/04/20 1518  TSH 0.657   Anemia work up: No results for input(s): VITAMINB12, FOLATE, FERRITIN, TIBC, IRON, RETICCTPCT in the last 72 hours. Sepsis Labs: Recent Labs  Lab 10/04/20 1019 10/05/20 0709  WBC 6.4 7.7    Microbiology Recent Results (from the past 240 hour(s))  Urine culture     Status: None   Collection Time: 10/04/20 12:30 PM   Specimen: Urine, Random  Result Value Ref Range Status   Specimen Description   Final    URINE, RANDOM Performed at [facility], [address] [place], [place] [address]    Special Requests   Final    NONE Performed at [facility], [address], [place], [place] [address]    Culture    Final    NO GROWTH Performed at [facility], [place] [address] [place],  [address]    Report Status 10/05/2020 FINAL  Final  Resp Panel by RT-PCR (Flu A&B, Covid) Nasopharyngeal Swab     Status: None   Collection Time: 10/04/20  2:22 PM   Specimen: Nasopharyngeal Swab; Nasopharyngeal(NP) swabs in vial transport medium  Result Value Ref Range Status   SARS Coronavirus 2 by RT PCR NEGATIVE NEGATIVE Final    Comment: (NOTE) SARS-CoV-2 target nucleic acids are NOT DETECTED.  The SARS-CoV-2 RNA is generally detectable in upper respiratory specimens during the acute phase of infection. The lowest  concentration of SARS-CoV-2 viral copies this assay can detect is 138 copies/mL. A negative result does not preclude SARS-Cov-2 infection and should not be used as the sole basis for treatment or other patient management decisions. A negative result may occur with  improper specimen collection/handling, submission of specimen other than nasopharyngeal swab, presence of viral mutation(s) within the areas targeted by this assay, and inadequate number of viral copies(<138 copies/mL). A negative result must be combined with clinical observations, patient history, and epidemiological information. The expected result is Negative.  Fact Sheet for Patients:  EntrepreneurPulse.com.au  Fact Sheet for Healthcare Providers:  IncredibleEmployment.be  This test is no t yet approved or cleared by the [place] FDA and  has been authorized for detection and/or diagnosis of SARS-CoV-2 by FDA under an Emergency Use Authorization (EUA). This EUA will remain  in effect (meaning this test can be used) for the duration of the COVID-19 declaration under Section 564(b)(1) of the Act, 21 U.S.C.section 360bbb-3(b)(1), unless the authorization is terminated  or revoked sooner.       Influenza A by PCR NEGATIVE NEGATIVE Final   Influenza B by PCR NEGATIVE NEGATIVE  Final    Comment: (NOTE) The Xpert Xpress SARS-CoV-2/FLU/RSV plus assay is intended as an aid in the diagnosis of influenza from Nasopharyngeal swab specimens and should not be used as a sole basis for treatment. Nasal washings and aspirates are unacceptable for Xpert Xpress SARS-CoV-2/FLU/RSV testing.  Fact Sheet for Patients: EntrepreneurPulse.com.au  Fact Sheet for Healthcare Providers: IncredibleEmployment.be  This test is not yet approved or cleared by the [place] FDA and has been authorized for detection and/or diagnosis of SARS-CoV-2 by FDA under an Emergency Use Authorization (EUA). This EUA will remain in effect (meaning this test can be used) for the duration of the COVID-19 declaration under Section 564(b)(1) of the Act, 21 U.S.C. section 360bbb-3(b)(1), unless the authorization is terminated or revoked.  Performed at [facility], [address] [place], [place] [address]     Procedures and diagnostic studies:  CT Head Wo Contrast  Result Date: 10/04/2020 CLINICAL DATA:  Confusion/delirium EXAM: CT HEAD WITHOUT CONTRAST TECHNIQUE: Contiguous axial images were obtained from the base of the skull through the vertex without intravenous contrast. COMPARISON:  None. FINDINGS: Brain: Mild to moderate diffuse atrophy is present. There is no intracranial mass, hemorrhage extra-axial fluid collection, or midline shift. There is mild decreased attenuation in the centra semiovale bilaterally. Elsewhere brain parenchyma appears unremarkable. No acute infarct evident. Vascular: No hyperdense vessel. There are foci of calcification in the carotid siphon regions. Skull: The bony calvarium appears intact. Sinuses/Orbits: Visualized paranasal sinuses are clear. Visualized orbits appear symmetric bilaterally. Other: Visualized mastoid air cells are clear. IMPRESSION: Atrophy with slight periventricular small vessel disease. No acute  infarct. No mass or hemorrhage. There are foci of arterial vascular calcification. Electronically Signed   By: Lowella Grip III M.D.   On: 10/04/2020 13:18   DG Chest Portable 1 View  Result Date: 10/04/2020 CLINICAL DATA:  Delirium/confusion EXAM: PORTABLE CHEST 1 VIEW COMPARISON:  None. FINDINGS: Lungs are clear. Heart size and pulmonary vascularity are normal. No adenopathy. There is aortic atherosclerosis. No bone lesions. IMPRESSION: Lungs clear. Heart size normal. Aortic Atherosclerosis (ICD10-I70.0). Electronically Signed   By: Lowella Grip III M.D.   On: 10/04/2020 13:19    Medications:   . amLODipine  5 mg Oral Daily  . atenolol  25 mg Oral QODAY  . calcium-vitamin D  1 tablet Oral  BID  . enoxaparin (LOVENOX) injection  30 mg Subcutaneous Q24H  . multivitamin-lutein  1 capsule Oral Daily  . omega-3 acid ethyl esters  1 g Oral Daily  . pravastatin  20 mg Oral q1800   Continuous Infusions: . cefTRIAXone (ROCEPHIN)  IV 1 g (10/05/20 1849)     LOS: 1 day   Geradine Girt  [facility]   How to contact the [facility] Attending or Consulting provider [place] or covering provider during after hours [place], for this patient?  1. Check the care team in [facility] and look for a) attending/consulting [facility] provider listed and b) the [facility] team listed 2. Log into [email or web address] and use [place]'s universal password to access. If you do not have the password, please contact the hospital operator. 3. Locate the [facility] provider you are looking for under [facility] and page to a number that you can be directly reached. 4. If you still have difficulty reaching the provider, please page the [facility] (Director on Call) for the Hospitalists listed on amion for assistance.  10/06/2020, 12:10 PM

## 2020-10-07 DIAGNOSIS — G9341 Metabolic encephalopathy: Secondary | ICD-10-CM | POA: Diagnosis not present

## 2020-10-07 DIAGNOSIS — I10 Essential (primary) hypertension: Secondary | ICD-10-CM | POA: Diagnosis not present

## 2020-10-07 LAB — GLUCOSE, CAPILLARY
Glucose-Capillary: 127 mg/dL — ABNORMAL HIGH (ref 70–99)
Glucose-Capillary: 111 mg/dL — ABNORMAL HIGH (ref 70–99)
Glucose-Capillary: 94 mg/dL (ref 70–99)

## 2020-10-07 LAB — BASIC METABOLIC PANEL
Anion gap: 8 (ref 5–15)
BUN: 11 mg/dL (ref 8–23)
CO2: 28 mmol/L (ref 22–32)
Calcium: 9.2 mg/dL (ref 8.9–10.3)
Chloride: 92 mmol/L — ABNORMAL LOW (ref 98–111)
Creatinine, Ser: 0.5 mg/dL (ref 0.44–1.00)
GFR, Estimated: >60 mL/min (ref >60)
Glucose, Bld: 111 mg/dL — ABNORMAL HIGH (ref 70–99)
Potassium: 4.1 mmol/L (ref 3.5–5.1)
Sodium: 128 mmol/L — ABNORMAL LOW (ref 135–145)

## 2020-10-07 MED ORDER — DEXTROSE 5 % IV SOLN
10.0000 mg/kg | INTRAVENOUS | Status: DC
  Administered 2020-10-07: 365 mg via INTRAVENOUS
  Filled 2020-10-07 (×2): qty 7.3

## 2020-10-07 MED ORDER — SODIUM CHLORIDE 0.9 % IV SOLN: INTRAVENOUS | Status: DC

## 2020-10-07 MED ORDER — CYANOCOBALAMIN 1000 MCG/ML IJ SOLN
1000.0000 ug | Freq: Once | INTRAMUSCULAR | Status: AC
  Administered 2020-10-07: 1000 ug via INTRAMUSCULAR
  Filled 2020-10-07 (×2): qty 1

## 2020-10-07 MED ORDER — DEXTROSE 5 % IV SOLN: 10.0000 mg/kg | INTRAVENOUS | Status: DC

## 2020-10-07 NOTE — Progress Notes (Signed)
Patient refused MRI last night but might be willing to do it in the morning.  Will continue to monitor.  Angela Mason

## 2020-10-07 NOTE — Progress Notes (Signed)
A consult was placed to the IV Therapist for an iv restart; per RN, pt has pulled out other ivs;  Pt found to be eating her dinner at 1710;  Noted that pt has multiple,  suitable veins bilaterally, without a tourniquet applied  ;Will have staff attempt,  per policy,  when pt is finished with her dinner.

## 2020-10-07 NOTE — Progress Notes (Addendum)
Neurology Progress Note   S:// Seen and examined. No acute changes Refused MRI overnight. Also has been refusing her medications-Blood pressures over the last 24 hours ranged from 540-086 systolic.   O:// Current vital signs: BP (!) 173/70 (BP Location: Right Arm)   Pulse 61   Temp 97.6 F (36.4 C) (Oral)   Resp (!) 22   Wt 36.7 kg   SpO2 99%   BMI 16.60 kg/m  Vital signs in last 24 hours: Temp:  [97.6 F (36.4 C)] 97.6 F (36.4 C) (03/13 0625) Pulse Rate:  [61-69] 61 (03/13 0954) Resp:  [16-22] 22 (03/13 0954) BP: (157-173)/(70-75) 173/70 (03/13 0954) SpO2:  [96 %-100 %] 99 % (03/13 0954) General: Awake alert, appearing somewhat wanted. HEENT: Normocephalic atraumatic Lungs: Clear Cardiovascular: Regular rhythm Abdomen soft nondistended nontender Extremities warm well perfused with no edema Neurological exam Mental status: Awake alert oriented x3. Was able to name the president. Was able to tell me where she is right now Was able to tell me where she lives on a regular basis That said, then she started complaining about seeing her daughter on the wall-specifically in a picture-that picture that she was referring to was a picture in her room that shows a staff member's hand reaching for the hand sanitizer pump.  She said that her daughter is being tortured and saw her family members because somebody is trying to hurt her. She is able to follow all commands Her speech was not dysarthric She seemed a little distracted throughout during this encounter. There was no evidence of aphasia Cranial nerves II to XII are grossly intact Motor exam with no drift although she was not very cooperative with the full exam. Sensory exam with intact sensation-again with the limitation of her attention concentration and participation Coordination-could not assess dysmetria  Medications  Current Facility-Administered Medications:  .  acetaminophen (TYLENOL) suppository 650 mg, 650 mg,  Rectal, Q6H PRN, Ivor Costa, MD .  acetaminophen (TYLENOL) tablet 650 mg, 650 mg, Oral, Q6H PRN, Ivor Costa, MD .  albuterol (VENTOLIN HFA) 108 (90 Base) MCG/ACT inhaler 2 puff, 2 puff, Inhalation, Q4H PRN, Ivor Costa, MD .  amLODipine (NORVASC) tablet 5 mg, 5 mg, Oral, Daily, Ivor Costa, MD, 5 mg at 10/07/20 0923 .  atenolol (TENORMIN) tablet 25 mg, 25 mg, Oral, Henreitta Cea, MD, 25 mg at 10/06/20 0934 .  calcium-vitamin D (OSCAL WITH D) 500-200 MG-UNIT per tablet 1 tablet, 1 tablet, Oral, BID, Ivor Costa, MD, 1 tablet at 10/06/20 0933 .  enoxaparin (LOVENOX) injection 30 mg, 30 mg, Subcutaneous, Q24H, Ivor Costa, MD, 30 mg at 10/05/20 2200 .  hydrALAZINE (APRESOLINE) injection 5 mg, 5 mg, Intravenous, Q2H PRN, Ivor Costa, MD .  multivitamin-lutein (OCUVITE-LUTEIN) capsule 1 capsule, 1 capsule, Oral, Daily, Ivor Costa, MD, 1 capsule at 10/07/20 7619 .  naphazoline-glycerin (CLEAR EYES REDNESS) ophth solution 1 drop, 1 drop, Both Eyes, QID PRN, Ivor Costa, MD .  omega-3 acid ethyl esters (LOVAZA) capsule 1 g, 1 g, Oral, Daily, Ivor Costa, MD, 1 g at 10/06/20 0933 .  ondansetron (ZOFRAN) injection 4 mg, 4 mg, Intravenous, Q8H PRN, Ivor Costa, MD .  pravastatin (PRAVACHOL) tablet 20 mg, 20 mg, Oral, q1800, Ivor Costa, MD, 20 mg at 10/06/20 1753 Labs CBC    Component Value Date/Time   WBC 7.7 10/05/2020 0709   RBC 3.75 (L) 10/05/2020 0709   HGB 12.3 10/05/2020 0709   HGB 14.1 06/04/2012 1008   HCT 35.2 (L) 10/05/2020 5093  HCT 42.4 06/04/2012 1008   PLT 282 10/05/2020 0709   PLT 263 06/04/2012 1008   MCV 93.9 10/05/2020 0709   MCV 95 06/04/2012 1008   MCH 32.8 10/05/2020 0709   MCHC 34.9 10/05/2020 0709   RDW 11.8 10/05/2020 0709   RDW 12.5 06/04/2012 1008   LYMPHSABS 1.3 06/03/2012 1437   MONOABS 0.5 06/03/2012 1437   EOSABS 0.0 06/03/2012 1437   BASOSABS 0.0 06/03/2012 1437    CMP     Component Value Date/Time   NA 128 (L) 10/07/2020 0542   NA 138 06/04/2012 1008   K  4.1 10/07/2020 0542   K 5.4 (H) 06/04/2012 1008   CL 92 (L) 10/07/2020 0542   CL 103 06/04/2012 1008   CO2 28 10/07/2020 0542   CO2 24 06/04/2012 1008   GLUCOSE 111 (H) 10/07/2020 0542   GLUCOSE 124 (H) 06/04/2012 1008   BUN 11 10/07/2020 0542   BUN 14 06/04/2012 1008   CREATININE 0.50 10/07/2020 0542   CREATININE 0.63 06/04/2012 1008   CALCIUM 9.2 10/07/2020 0542   CALCIUM 8.8 06/04/2012 1008   PROT 6.7 10/04/2020 1019   PROT 7.6 06/04/2012 1008   ALBUMIN 3.9 10/04/2020 1019   ALBUMIN 3.5 06/04/2012 1008   AST 24 10/04/2020 1019   AST 48 (H) 06/04/2012 1008   ALT 20 10/04/2020 1019   ALT 20 06/04/2012 1008   ALKPHOS 58 10/04/2020 1019   ALKPHOS 95 06/04/2012 1008   BILITOT 0.9 10/04/2020 1019   BILITOT 0.9 06/04/2012 1008   GFRNONAA >60 10/07/2020 0542   GFRNONAA >60 06/04/2012 1008   GFRAA 90 01/11/2020 1415   GFRAA >60 06/04/2012 1008    Imaging I have reviewed images in epic and the results pertinent to this consultation are: CT head with no new acute changes MRI brain pending  Assessment:  87 year old with past medical history of hypertension hyperlipidemia brought in for evaluation of increasing confusion.  On my examination today, she is very tangential, also having hallucinations about seeing her daughter on the walls etc. as above. She came in with concerns for UTI, which was treated outpatient.  That has been completed. She continues to be confused. She is also noted to have hyponatremia.  Management primary team. She has had no fevers, no leukocytosis-but given altered mental status, HSV encephalitis can also be in the differentials and presence of hyponatremia can also support that too. She is aversive to any procedure including noninvasive procedure such as MRI.  She is completely against getting invasive procedure such as LP. Discussed empiric acyclovir with primary hospitalist. Her pressures are high-systolics 341D to 622W but I doubt that this is posterior  reversible encephalopathy syndrome.  An MRI would be helpful to determine that.  Impression: -Altered mental status in the setting of UTI-cannot rule out HSV encephalitis -Hyponatremia -UTI-resolved  Recommendations:  MRI of the brain-I would recommend using Ativan if needed-the hospitalist will attempt to convince her.  Continue current medical management per primary team as you are  Consider LP based on clinical course and MRI results.  Specifically order HSV PCR, in addition to glucose, protein, cell count.  Empiric IV acyclovir -pharmacy consulted  EEG in AM We will follow the imaging and clinical exam with you. Discussed with Dr. Eliseo Squires -- Amie [place], MD Neurologist [facility] Pager: [phone]

## 2020-10-07 NOTE — Consult Note (Signed)
[facility] Consult   Reason for Consult:  Altered mental status Referring Physician:  Dr. Eliseo Squires Patient Identification: Angela Mason MRN:  [id number] Principal Diagnosis: Acute metabolic encephalopathy Diagnosis:  Principal Problem:   Acute metabolic encephalopathy Active Problems:   HLD (hyperlipidemia)   HTN (hypertension)   Hypothyroidism   Hyponatremia   Hypertensive urgency   UTI (urinary tract infection)   AMS (altered mental status)   Total Time spent with patient: 30 minutes  Subjective:    Mrs. Lauter is an 87 y.o. female patient without known psychiatric history and medical history significant ofhypertension, hyperlipidemia, COPD, hypothyroidism, cystocele, who presents with altered mental status.  Per main treatment team attending note, Psychiatry consulted for assistance.  HPI:  Reportedly, patient was diagnosed with UTI on 3/7 and started on cefuroxime without good response to current treatment. Patient was noted to be confused in the past severe days, withparanoia. She is not acting like herself.Per report, her normal baseline is: "independent,normally alert, oriented, and candrive car."  Awaiting MRI.  Patient seen in her hospital room. Patient confirms she was feeling confused and not herself for several days since she was diagnosed with UTI. She reports she lives with husband, who is currently in rehab. She reports she was always independent. Her only daughter lives with her family in [place]. Patient reports her sister lives in [place], but patient does not like to bother her as she has her own health issues.  Patient denies she was ever diagnosed with any mental health condition. Patient is able to tell her name, date of birth, place, sitution correctly. She knows month and current year, confused about the date.  Currently identifies her mood as "frustrated because of this infection". She expresses anxiety that she will not get better soon.  She is hopeful for good outcome though. She denies feeling depressed. Denies having any thoughts of harming self or others. She denies past suicidal attempts. She denies feeling irritable. She reports poor sleep currently due to symptoms of UTI and being in the hospital. Denies problems with appetite.  Patient denies auditory or visual hallucinations, although reports symptoms of possible visual illusions "when I look at that picture on the wall I cannot see it well, but I imagine different pictures instead". (Of note, there is a poster on the wall about the importance of hand washing with a drawn sanitizer on it.)    Past Psychiatric History:  No past psych Dx No h/o suicidal attempts No past psych medications per patient and per chart review.  Risk to Self:   Risk to Others:   Prior Inpatient Therapy:   Prior Outpatient Therapy:    Past Medical History:  Past Medical History:  Diagnosis Date  . Cancer ([facility])    SKIN  . COPD, mild ([facility])   . Dyspnea   . Edema   . HLD (hyperlipidemia)   . HTN (hypertension)   . Hypothyroidism   . Macular degeneration, bilateral   . Varicosities     Past Surgical History:  Procedure Laterality Date  . CATARACT EXTRACTION W/PHACO Right 02/03/2017   Procedure: CATARACT EXTRACTION PHACO AND INTRAOCULAR LENS PLACEMENT (IOC);  Surgeon: Birder Robson, MD;  Location: [facility] ORS;  Service: Ophthalmology;  Laterality: Right;  [place] 00:57.2 AP% 18.5 CDE 10.58 Fluid pack lot # [id number]  . CATARACT EXTRACTION W/PHACO Left 02/24/2017   Procedure: CATARACT EXTRACTION PHACO AND INTRAOCULAR LENS PLACEMENT (IOC);  Surgeon: Birder Robson, MD;  Location: [facility] ORS;  Service: Ophthalmology;  Laterality: Left;  [place] 01:12 AP% 25.0 CDE 18.03 Fluid pack lot # [id number]  . MOHS SURGERY  2015   nose  . SALIVARY GLAND SURGERY Left 2012  . TONSILLECTOMY  1940   Family History:  Family History  Problem Relation Age of Onset  . COPD Father   . Heart failure Father   .  Diverticulitis Mother   . Macular degeneration Mother   . Epilepsy Sister   . Parkinson's disease Brother   . Cancer Neg Hx   . Diabetes Neg Hx   . Breast cancer Neg Hx    Family Psychiatric  History:  Social History:  Social History   Substance and Sexual Activity  Alcohol Use Yes  . Alcohol/week: 0.0 standard drinks   Comment: 1 qd     Social History   Substance and Sexual Activity  Drug Use No    Social History   Socioeconomic History  . Marital status: Married    Spouse name: Not on file  . Number of children: Not on file  . Years of education: Not on file  . Highest education level: Not on file  Occupational History  . Not on file  Tobacco Use  . Smoking status: Former Smoker    Quit date: 09/05/2005    Years since quitting: 15.0  . Smokeless tobacco: Never Used  Vaping Use  . Vaping Use: Never used  Substance and Sexual Activity  . Alcohol use: Yes    Alcohol/week: 0.0 standard drinks    Comment: 1 qd  . Drug use: No  . Sexual activity: Not Currently    Birth control/protection: Post-menopausal  Other Topics Concern  . Not on file  Social History Narrative  . Not on file   Social Determinants of Health   Financial Resource Strain: Not on file  Food Insecurity: Not on file  Transportation Needs: Not on file  Physical Activity: Not on file  Stress: Not on file  Social Connections: Not on file   Additional Social History:    Allergies:   Allergies  Allergen Reactions  . Gentamicin   . Levofloxacin Diarrhea    Labs:  Results for orders placed or performed during the hospital encounter of 10/04/20 (from the past 48 hour(s))  Glucose, capillary     Status: Abnormal   Collection Time: 10/06/20  7:50 AM  Result Value Ref Range   Glucose-Capillary 125 (H) 70 - 99 mg/dL    Comment: Glucose reference range applies only to samples taken after fasting for at least 8 hours.  Basic metabolic panel     Status: Abnormal   Collection Time: 10/06/20   7:51 AM  Result Value Ref Range   Sodium 132 (L) 135 - 145 mmol/L   Potassium 4.0 3.5 - 5.1 mmol/L   Chloride 96 (L) 98 - 111 mmol/L   CO2 29 22 - 32 mmol/L   Glucose, Bld 123 (H) 70 - 99 mg/dL    Comment: Glucose reference range applies only to samples taken after fasting for at least 8 hours.   BUN 12 8 - 23 mg/dL   Creatinine, Ser 0.51 0.44 - 1.00 mg/dL   Calcium 9.1 8.9 - 10.3 mg/dL   GFR, Estimated >60 >60 mL/min    Comment: (NOTE) Calculated using the CKD-EPI Creatinine Equation (2021)    Anion gap 7 5 - 15    Comment: Performed at [facility], [address], [place], [place] [address]  Vitamin B12     Status: None  Collection Time: 10/06/20 10:09 AM  Result Value Ref Range   Vitamin B-12 355 180 - 914 pg/mL    Comment: (NOTE) This assay is not validated for testing neonatal or myeloproliferative syndrome specimens for Vitamin B12 levels. Performed at [facility], [place] [address], [place], [place] [address]   Ammonia     Status: Abnormal   Collection Time: 10/06/20 10:09 AM  Result Value Ref Range   Ammonia <9 (L) 9 - 35 umol/L    Comment: Performed at [facility], [address] [place], [place] [address]  Basic metabolic panel     Status: Abnormal   Collection Time: 10/07/20  5:42 AM  Result Value Ref Range   Sodium 128 (L) 135 - 145 mmol/L   Potassium 4.1 3.5 - 5.1 mmol/L   Chloride 92 (L) 98 - 111 mmol/L   CO2 28 22 - 32 mmol/L   Glucose, Bld 111 (H) 70 - 99 mg/dL    Comment: Glucose reference range applies only to samples taken after fasting for at least 8 hours.   BUN 11 8 - 23 mg/dL   Creatinine, Ser 0.50 0.44 - 1.00 mg/dL   Calcium 9.2 8.9 - 10.3 mg/dL   GFR, Estimated >60 >60 mL/min    Comment: (NOTE) Calculated using the CKD-EPI Creatinine Equation (2021)    Anion gap 8 5 - 15    Comment: Performed at [facility], [address] [place], [place] [address]  Glucose, capillary     Status: Abnormal   Collection  Time: 10/07/20  8:19 AM  Result Value Ref Range   Glucose-Capillary 127 (H) 70 - 99 mg/dL    Comment: Glucose reference range applies only to samples taken after fasting for at least 8 hours.  Glucose, capillary     Status: Abnormal   Collection Time: 10/07/20 11:47 AM  Result Value Ref Range   Glucose-Capillary 111 (H) 70 - 99 mg/dL    Comment: Glucose reference range applies only to samples taken after fasting for at least 8 hours.    Current Facility-Administered Medications  Medication Dose Route Frequency Provider Last Rate Last Admin  . 0.9 %  sodium chloride infusion   Intravenous Continuous Vann, Jessica U, DO      . acetaminophen (TYLENOL) suppository 650 mg  650 mg Rectal Q6H PRN Ivor Costa, MD      . acetaminophen (TYLENOL) tablet 650 mg  650 mg Oral Q6H PRN Ivor Costa, MD      . acyclovir (ZOVIRAX) 365 mg in dextrose 5 % 100 mL IVPB  10 mg/kg Intravenous Q24H Rauer, Samantha O, RPH      . albuterol (VENTOLIN HFA) 108 (90 Base) MCG/ACT inhaler 2 puff  2 puff Inhalation Q4H PRN Ivor Costa, MD      . amLODipine (NORVASC) tablet 5 mg  5 mg Oral Daily Ivor Costa, MD   5 mg at 10/07/20 2694  . atenolol (TENORMIN) tablet 25 mg  25 mg Oral Henreitta Cea, MD   25 mg at 10/06/20 0934  . calcium-vitamin D (OSCAL WITH D) 500-200 MG-UNIT per tablet 1 tablet  1 tablet Oral BID Ivor Costa, MD   1 tablet at 10/06/20 0933  . cyanocobalamin ((VITAMIN B-12)) injection 1,000 mcg  1,000 mcg Intramuscular Once Amie [place], MD      . enoxaparin (LOVENOX) injection 30 mg  30 mg Subcutaneous Q24H Ivor Costa, MD   30 mg at 10/05/20 2200  . hydrALAZINE (APRESOLINE) injection  5 mg  5 mg Intravenous Q2H PRN Ivor Costa, MD      . multivitamin-lutein [facility]) capsule 1 capsule  1 capsule Oral Daily Ivor Costa, MD   1 capsule at 10/07/20 8546  . naphazoline-glycerin (CLEAR EYES REDNESS) ophth solution 1 drop  1 drop Both Eyes QID PRN Ivor Costa, MD      . omega-3 acid ethyl esters (LOVAZA) capsule  1 g  1 g Oral Daily Ivor Costa, MD   1 g at 10/06/20 0933  . ondansetron (ZOFRAN) injection 4 mg  4 mg Intravenous Q8H PRN Ivor Costa, MD      . pravastatin (PRAVACHOL) tablet 20 mg  20 mg Oral q1800 Ivor Costa, MD   20 mg at 10/06/20 1753    Psychiatric Specialty Exam:  MENTAL STATUS EXAM:  Appearance:  CF, appearing stated age;  wearing hospital attire, with fair grooming and hygiene. Normal level of alertness at the time of the interview and appropriate facial expression.  Attitude/Behavior: calm, cooperative, engaging with appropriate eye contact.  Motor: WNL; dyskinesias not evident.   Speech: spontaneous, clear, coherent, normal comprehension.  Mood: euthymic, " okay ".  Affect: appropriately-reactive.  Thought process: patient appears coherent, organized, goal-directed.  Thought content: patient denies suicidal thoughts, denies homicidal thoughts; did not express any delusions.  Thought perception: patient denies auditory and visual hallucinations. Reports possible visual illusions.  Cognition: patient is alert and oriented in self, place, partly date.  Insight: limited  Judgement: fair.  Physical Exam: Physical Exam ROS Blood pressure 133/70, pulse 71, temperature 97.6 F (36.4 C), temperature source Oral, resp. rate 19, weight 36.7 kg, SpO2 98 %. Body mass index is 16.6 kg/m.  Treatment Plan Summary:   87 y.o. female patient without known psychiatric history and medical history significant ofhypertension, hyperlipidemia, COPD, hypothyroidism, cystocele, who presents with altered mental status. Patient`s symptoms, reported in the chart, such as waxing and waning confusion in a day, altered sensorium, impaired attention, support the diagnosis of delirium.  At the time of the interview, patient was alert, mostly oriented, expressed no unsafe thoughts or plans. No evidence of imminent risk to self or others at present.   Patient does not meet criteria for  psychiatric inpatient admission. Supportive therapy provided about ongoing stressors.   As with all hospitalized patients, would recommend delirium precautions: Minimize use of benzodiazepines, anticholinergics, and opiates, to the extent possible, as these can worsen mentation. Utilize non-pharmacological interventions including frequent reorientation, maintaining day/night distinction (blinds closed during night, open during day), minimizing excessive stimulation, staff continuity, etc.  In case of agitation or psychotic symptoms, can use low-dose of Seroquel 12.5 - 25mg  PO as needed Q6h.  Psychiatry will follow-up if needed. Please, message to Psych Consult covering MD for further assistance.   Larita Fife, MD 10/07/2020 2:50 PM

## 2020-10-07 NOTE — Progress Notes (Addendum)
Progress Note    Angela Mason  DJS:[id number] DOB: August 20, 1932  DOA: 10/04/2020 PCP: Albina Billet, MD    Brief Narrative:     Medical records reviewed and are as summarized below:  Angela Mason is an 87 y.o. female with medical history significant of hypertension, hyperlipidemia, COPD, hypothyroidism, cystocele, who presents with altered mental status.  Per report, and her normal baseline, patient is independent, normally alert, oriented, and can drive car. Pt was noted to be confused in the past severe days, with paranoia. She is not acting like herself.   She has poor appetite and decreased oral intake. She was diagnosed with UTI on 3/7 and started on cefuroxime.  Not responding to current treatment.  Await MRI.   Assessment/Plan:   Principal Problem:   Acute metabolic encephalopathy Active Problems:   HLD (hyperlipidemia)   HTN (hypertension)   Hypothyroidism   Hyponatremia   Hypertensive urgency   UTI (urinary tract infection)   AMS (altered mental status)   Acute metabolic encephalopathy: Likely multifactorial etiology, including hypertensive urgency, hyponatremia, UTI, possible PRES syndrome  CT head negative for acute intracranial abnormalities.  No focal neuro deficit on physical examination. -continues to be oriented to person/place/time but more paranoid and hostile -Treat underlying issues as below -? PRES as BP was high when patient arrived in the hospital- MRI Pending -neurology consult appreciated- no fever but pending MRI results there is ? Of HSV-- discussed with neurology and will start IV acyclovir, psych consult -EEG in AM  UTI: Patient was started on on cefuroxime 10/01/20, urinalysis negative -Will continue to treat with Rocephin for total of 5 days (including outpt abx) -culture negative as treated outpatient -not impressed that this is the current cause of her issues  HLD (hyperlipidemia) -Pravastatin  HTN (hypertension) and hypertensive  urgency: Blood pressure 207/92, 182/91 -continue home Atenolol-- HR in the 60s so not able to increase atenolol -add amlodipine 5 mg daily -IV hydralazine as needed  H/o Hypothyroidism: not taking meds now - TSH normal  Hyponatremia: Na 124.  -Likely due to poor oral intake and dehydration.  -Urine Na >20 and urine osmo > 100 -- suspect SIADH so will fluid restrict and trend  -has been under increased stress with husband's illness -check cortisol -TSH normal    Family Communication/Anticipated D/C date and plan/Code Status   DVT prophylaxis: Lovenox ordered. Code Status: Full Code.  Family Communication: called home number- no answer; daughter for number is international and I was unable to complete via the phone system Disposition Plan: Status is: Inpatient  Remains inpatient appropriate because:Inpatient level of care appropriate due to severity of illness   Dispo: The patient is from: Home ILF              Anticipated d/c is to: Home              Patient currently is not medically stable to d/c. still not at baseline   Difficult to place patient No   Medical Consultants:    Neuro    Subjective:   Thinks daughter is trapped in the wall  Objective:    Vitals:   10/06/20 1413 10/07/20 0625 10/07/20 0928 10/07/20 0954  BP: (!) 159/74 (!) 157/75  (!) 173/70  Pulse: 61 64 69 61  Resp: (!) 22 16  (!) 22  Temp: 97.6 F (36.4 C) 97.6 F (36.4 C)    TempSrc: Oral Oral    SpO2: 100% 98% 96% 99%  Weight:  Intake/Output Summary (Last 24 hours) at 10/07/2020 1118 Last data filed at 10/07/2020 0900 Gross per 24 hour  Intake 120 ml  Output 500 ml  Net -380 ml   Filed Weights   10/04/20 1640  Weight: 36.7 kg    Exam:  General: Appearance:    Thin female with poor eye contact     Lungs:     respirations unlabored  Heart:    Normal heart rate.    MS:   All extremities are intact.   Neurologic:   Awake, alert, oriented x 3. Uncooperative with exam.        Data Reviewed:   I have personally reviewed following labs and imaging studies:  Labs: Labs show the following:   Basic Metabolic Panel: Recent Labs  Lab 10/04/20 1518 10/04/20 2334 10/05/20 0710 10/06/20 0751 10/07/20 0542  NA 124* 128* 129* 132* 128*  K 4.2 3.4* 3.7 4.0 4.1  CL 86* 92* 95* 96* 92*  CO2 28 28 28 29 28   GLUCOSE 107* 87 91 123* 111*  BUN 9 15 14 12 11   CREATININE 0.41* 0.63 0.59 0.51 0.50  CALCIUM 9.0 8.3* 8.9 9.1 9.2   GFR Estimated Creatinine Clearance: 29.8 mL/min (by C-G formula based on SCr of 0.5 mg/dL). Liver Function Tests: Recent Labs  Lab 10/04/20 1019  AST 24  ALT 20  ALKPHOS 58  BILITOT 0.9  PROT 6.7  ALBUMIN 3.9   No results for input(s): LIPASE, AMYLASE in the last 168 hours. Recent Labs  Lab 10/06/20 1009  AMMONIA <9*   Coagulation profile No results for input(s): INR, PROTIME in the last 168 hours.  CBC: Recent Labs  Lab 10/04/20 1019 10/05/20 0709  WBC 6.4 7.7  HGB 12.4 12.3  HCT 36.1 35.2*  MCV 93.0 93.9  PLT 273 282   Cardiac Enzymes: No results for input(s): CKTOTAL, CKMB, CKMBINDEX, TROPONINI in the last 168 hours. BNP (last 3 results) No results for input(s): PROBNP in the last 8760 hours. CBG: Recent Labs  Lab 10/05/20 0739 10/06/20 0750 10/07/20 0819  GLUCAP 72 125* 127*   D-Dimer: No results for input(s): DDIMER in the last 72 hours. Hgb A1c: No results for input(s): HGBA1C in the last 72 hours. Lipid Profile: No results for input(s): CHOL, HDL, LDLCALC, TRIG, CHOLHDL, LDLDIRECT in the last 72 hours. Thyroid function studies: Recent Labs    10/04/20 1518  TSH 0.657   Anemia work up: Recent Labs    10/06/20 1009  VITAMINB12 355   Sepsis Labs: Recent Labs  Lab 10/04/20 1019 10/05/20 0709  WBC 6.4 7.7    Microbiology Recent Results (from the past 240 hour(s))  Urine culture     Status: None   Collection Time: 10/04/20 12:30 PM   Specimen: Urine, Random  Result Value Ref  Range Status   Specimen Description   Final    URINE, RANDOM Performed at [facility], [address] [place], [place] [address]    Special Requests   Final    NONE Performed at [facility], [address], [place], [place] [address]    Culture   Final    NO GROWTH Performed at [facility], [place] [address], [place],  [address]    Report Status 10/05/2020 FINAL  Final  Resp Panel by RT-PCR (Flu A&B, Covid) Nasopharyngeal Swab     Status: None   Collection Time: 10/04/20  2:22 PM   Specimen: Nasopharyngeal Swab; Nasopharyngeal(NP) swabs in vial transport  medium  Result Value Ref Range Status   SARS Coronavirus 2 by RT PCR NEGATIVE NEGATIVE Final    Comment: (NOTE) SARS-CoV-2 target nucleic acids are NOT DETECTED.  The SARS-CoV-2 RNA is generally detectable in upper respiratory specimens during the acute phase of infection. The lowest concentration of SARS-CoV-2 viral copies this assay can detect is 138 copies/mL. A negative result does not preclude SARS-Cov-2 infection and should not be used as the sole basis for treatment or other patient management decisions. A negative result may occur with  improper specimen collection/handling, submission of specimen other than nasopharyngeal swab, presence of viral mutation(s) within the areas targeted by this assay, and inadequate number of viral copies(<138 copies/mL). A negative result must be combined with clinical observations, patient history, and epidemiological information. The expected result is Negative.  Fact Sheet for Patients:  EntrepreneurPulse.com.au  Fact Sheet for Healthcare Providers:  IncredibleEmployment.be  This test is no t yet approved or cleared by the [place] FDA and  has been authorized for detection and/or diagnosis of SARS-CoV-2 by FDA under an Emergency Use Authorization (EUA). This EUA will remain  in effect (meaning this test can  be used) for the duration of the COVID-19 declaration under Section 564(b)(1) of the Act, 21 U.S.C.section 360bbb-3(b)(1), unless the authorization is terminated  or revoked sooner.       Influenza A by PCR NEGATIVE NEGATIVE Final   Influenza B by PCR NEGATIVE NEGATIVE Final    Comment: (NOTE) The Xpert Xpress SARS-CoV-2/FLU/RSV plus assay is intended as an aid in the diagnosis of influenza from Nasopharyngeal swab specimens and should not be used as a sole basis for treatment. Nasal washings and aspirates are unacceptable for Xpert Xpress SARS-CoV-2/FLU/RSV testing.  Fact Sheet for Patients: EntrepreneurPulse.com.au  Fact Sheet for Healthcare Providers: IncredibleEmployment.be  This test is not yet approved or cleared by the [place] FDA and has been authorized for detection and/or diagnosis of SARS-CoV-2 by FDA under an Emergency Use Authorization (EUA). This EUA will remain in effect (meaning this test can be used) for the duration of the COVID-19 declaration under Section 564(b)(1) of the Act, 21 U.S.C. section 360bbb-3(b)(1), unless the authorization is terminated or revoked.  Performed at [facility], [address] [place], [place] [address]     Procedures and diagnostic studies:  No results found.  Medications:   . amLODipine  5 mg Oral Daily  . atenolol  25 mg Oral QODAY  . calcium-vitamin D  1 tablet Oral BID  . cyanocobalamin  1,000 mcg Intramuscular Once  . enoxaparin (LOVENOX) injection  30 mg Subcutaneous Q24H  . multivitamin-lutein  1 capsule Oral Daily  . omega-3 acid ethyl esters  1 g Oral Daily  . pravastatin  20 mg Oral q1800   Continuous Infusions:    LOS: 2 days   Geradine Girt  [facility]   How to contact the [facility] Attending or Consulting provider [place] or covering provider during after hours [place], for this patient?  1. Check the care team in [facility] and look for a)  attending/consulting TRH provider listed and b) the [facility] team listed 2. Log into [email or web address] and use [place]'s universal password to access. If you do not have the password, please contact the hospital operator. 3. Locate the [facility] provider you are looking for under [facility] and page to a number that you can be directly reached. 4. If you still have difficulty reaching the provider, please page the  DOC (Director on Call) for the Hospitalists listed on amion for assistance.  10/07/2020, 11:18 AM

## 2020-10-07 NOTE — Progress Notes (Addendum)
Spoke with daughter Magda Paganini who lives in the [place], [place]  at [phone]. Explained rationale for treatment and testing. Reports rather acute and sudden change in mentation. She verbalized understanding and will be making arrangements to come here in the next several days. She tried to convince the patient to get MRI but patient refused. I will ask the primary team and my colleague taking over service tomorrow to try again to convince her to get MRI. Would not like to put her under [place] for testing. Daughter would appreciate update tomorrow - advised her that we will communicate with primary team, who will communicate full plan with her and we will remain available to answer neurology specific questions. Daughter spoke with Dr. Eliseo Squires as well and is very appreciative of her care.  -- Amie Portland, MD Neurologist

## 2020-10-07 NOTE — TOC Initial Note (Addendum)
Transition of Care [facility]) - Initial/Assessment Note    Patient Details  Name: Angela Mason MRN: [id number] Date of Birth: [birth year]  [facility] CM/SW Contact:    Harriet Masson, RN Phone Number:[phone] 10/07/2020, 5:07 PM  Clinical Narrative:                 Recommendations [facility] for PT/OT and RW. Spoke with pt who confirmed she lives in [place] with her spouse Gwyndolyn Saxon). Pt's choice for [facility]. Contacted Corene Cornea who accepted services for [employer]. Pt requested to contact her spouse Gwyndolyn Saxon) however unsuccessful with all calls and unable to leave a message. Please obtain a RW prior to pt's discharge. Pt does not have a preference for agency for DME.  Pt lives with her spouse only who and obtains her medications from [facility]. Support system in plan. No additional needs at this time.  TOC will continue to follow.  Expected Discharge Plan: [place] Barriers to Discharge: Continued Medical Work up   Patient Goals and CMS Choice        Expected Discharge Plan and Services Expected Discharge Plan: [place] In-house Referral: Clinical Social Work     Living arrangements for the past 2 months: [place]: PT,OT [place] Agency: [place] ([facility]) Date [place]: 10/07/20 Time [place]: 1706 Representative spoke with at [place]: Corene Cornea  Prior Living Arrangements/Services Living arrangements for the past 2 months: [place] Lives with:: Spouse Patient language and need for interpreter reviewed:: Yes Do you feel safe going back to the place where you live?: Yes      Need for Family Participation in Patient Care: Yes (Comment) Care giver support system in place?: Yes (comment)   Criminal Activity/Legal Involvement Pertinent to Current Situation/Hospitalization: No - Comment as needed  Activities of  Daily Living      Permission Sought/Granted   Permission granted to share information with : Yes, Verbal Permission Granted           Permission granted to share info w Contact Information: Spouse Gwyndolyn Saxon  Emotional Assessment Appearance:: Appears stated age Attitude/Demeanor/Rapport: Engaged Affect (typically observed): Accepting Orientation: : Oriented to Self,Oriented to Place   Psych Involvement: No (comment)  Admission diagnosis:  Hyponatremia [E87.1] Encephalopathy [N82.95] Acute metabolic encephalopathy [A21.30] AMS (altered mental status) [R41.82] Patient Active Problem List   Diagnosis Date Noted  . AMS (altered mental status) 10/05/2020  . Acute metabolic encephalopathy 86/57/8469  . UTI (urinary tract infection) 10/04/2020  . HLD (hyperlipidemia)   . HTN (hypertension)   . Hypothyroidism   . Hyponatremia   . Hypertensive urgency   . Encephalopathy   . Underweight 08/08/2020  . 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 08/04/2018  . Cystocele, midline 05/05/2017  . Nocturia 10/07/2016  . Unstable bladder 10/07/2016  . Vaginal atrophy 01/23/2016  . 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 Midline cystocele 11/01/2015   PCP:  Albina Billet, MD Pharmacy:   [facility], [place] [address] Phone: [phone] Fax: [phone]     Social Determinants of Health (SDOH) Interventions    Readmission Risk Interventions No flowsheet data found.

## 2020-10-07 NOTE — Progress Notes (Signed)
Pharmacy Antibiotic Note  Angela Mason is a 87 y.o. female admitted on 10/04/2020 with Acyclovir for HSV encephalitic coverage. No need for meningitic antibiotic coverage. PMH significant of hypertension, hyperlipidemia, COPD, hypothyroidism, and cystocele. Pt received 3 days of ceftriaxone for UTI. Per neurology, pt continues to be confused and is starting on acyclovir for HSV encephalitic coverage. Pharmacy has been consulted for acyclovir dosing.  Plan: Acyclovir 365mg  IV (~10mg /kg) q24h  Monitor renal function and adjust dose as clinically indicated.  Weight: 36.7 kg (80 lb 12.8 oz)  Temp (24hrs), Avg:97.6 F (36.4 C), Min:97.6 F (36.4 C), Max:97.6 F (36.4 C)  Recent Labs  Lab 10/04/20 1019 10/04/20 1518 10/04/20 2334 10/05/20 0709 10/05/20 0710 10/06/20 0751 10/07/20 0542  WBC 6.4  --   --  7.7  --   --   --   CREATININE 0.53 0.41* 0.63  --  0.59 0.51 0.50    Estimated Creatinine Clearance: 29.8 mL/min (by C-G formula based on SCr of 0.5 mg/dL).    Allergies  Allergen Reactions  . Gentamicin   . Levofloxacin Diarrhea    Antimicrobials this admission: Ceftriaxone 3/10 >> 3/12 Acyclovir 3/13 >>   Microbiology results: 3/10 UCx: no growth  Thank you for allowing pharmacy to be a part of this patient's care.  Sherilyn Banker, PharmD Pharmacy Resident  10/07/2020 11:38 AM

## 2020-10-08 DIAGNOSIS — G9340 Encephalopathy, unspecified: Secondary | ICD-10-CM

## 2020-10-08 DIAGNOSIS — G9341 Metabolic encephalopathy: Secondary | ICD-10-CM | POA: Diagnosis not present

## 2020-10-08 DIAGNOSIS — I10 Essential (primary) hypertension: Secondary | ICD-10-CM | POA: Diagnosis not present

## 2020-10-08 LAB — OSMOLALITY, URINE: Osmolality, Ur: 354 mOsm/kg (ref 300–900)

## 2020-10-08 LAB — BASIC METABOLIC PANEL
Anion gap: 6 (ref 5–15)
BUN: 18 mg/dL (ref 8–23)
CO2: 30 mmol/L (ref 22–32)
Calcium: 9.2 mg/dL (ref 8.9–10.3)
Chloride: 90 mmol/L — ABNORMAL LOW (ref 98–111)
Creatinine, Ser: 0.71 mg/dL (ref 0.44–1.00)
GFR, Estimated: >60 mL/min (ref >60)
Glucose, Bld: 82 mg/dL (ref 70–99)
Potassium: 4.3 mmol/L (ref 3.5–5.1)
Sodium: 126 mmol/L — ABNORMAL LOW (ref 135–145)

## 2020-10-08 LAB — GLUCOSE, CAPILLARY: Glucose-Capillary: 91 mg/dL (ref 70–99)

## 2020-10-08 LAB — CORTISOL-AM, BLOOD: Cortisol - AM: 18.6 ug/dL (ref 6.7–22.6)

## 2020-10-08 LAB — SODIUM, URINE, RANDOM: Sodium, Ur: 41 mmol/L

## 2020-10-08 MED ORDER — DEXTROSE 5 % IV SOLN
10.0000 mg/kg | Freq: Two times a day (BID) | INTRAVENOUS | Status: DC
  Administered 2020-10-08 – 2020-10-10 (×6): 365 mg via INTRAVENOUS
  Filled 2020-10-08 (×8): qty 7.3

## 2020-10-08 MED ORDER — LORAZEPAM 1 MG PO TABS: 1.0000 mg | ORAL_TABLET | ORAL | Status: AC | PRN

## 2020-10-08 MED ORDER — THIAMINE HCL 100 MG PO TABS
100.0000 mg | ORAL_TABLET | Freq: Every day | ORAL | Status: DC
  Administered 2020-10-10 – 2020-10-16 (×6): 100 mg via ORAL
  Filled 2020-10-08 (×6): qty 1

## 2020-10-08 MED ORDER — ADULT MULTIVITAMIN W/MINERALS CH
1.0000 | ORAL_TABLET | Freq: Every day | ORAL | Status: DC
  Administered 2020-10-10 – 2020-10-16 (×6): 1 via ORAL
  Filled 2020-10-08 (×6): qty 1

## 2020-10-08 MED ORDER — FOLIC ACID 1 MG PO TABS
1.0000 mg | ORAL_TABLET | Freq: Every day | ORAL | Status: DC
  Administered 2020-10-10 – 2020-10-16 (×6): 1 mg via ORAL
  Filled 2020-10-08 (×6): qty 1

## 2020-10-08 MED ORDER — QUETIAPINE FUMARATE 25 MG PO TABS
12.5000 mg | ORAL_TABLET | Freq: Four times a day (QID) | ORAL | Status: DC | PRN
Start: 2020-10-08 — End: 2020-10-12

## 2020-10-08 MED ORDER — LORAZEPAM 2 MG/ML IJ SOLN: 1.0000 mg | INTRAMUSCULAR | Status: AC | PRN

## 2020-10-08 MED ORDER — THIAMINE HCL 100 MG/ML IJ SOLN
100.0000 mg | Freq: Every day | INTRAMUSCULAR | Status: DC
  Filled 2020-10-08: qty 2

## 2020-10-08 NOTE — Procedures (Signed)
Pt refused EEG

## 2020-10-08 NOTE — Progress Notes (Signed)
PT Cancellation Note  Patient Details Name: Angela Mason MRN: [id number] DOB: [birth year]   Cancelled Treatment:    Reason Eval/Treat Not Completed: Patient declined, no reason specified. Patient declining out of bed activites or even bed exercises stating " I don't want to risk it." Patient questioning motives. She is alert and oriented.     Angela Mason 10/08/2020, 10:05 AM

## 2020-10-08 NOTE — Progress Notes (Addendum)
Neurology Progress Note   S:// Seen and examined. No acute changes. On entering the room, the patient preempted by saying she did not want the MRI and not to event try.  O:// Current vital signs: BP (!) 137/58    Pulse (!) 58    Temp 97.7 F (36.5 C) (Oral)    Resp 18    Wt 36.7 kg    SpO2 99%    BMI 16.60 kg/m  Vital signs in last 24 hours: Temp:  [97.5 F (36.4 C)-97.8 F (36.6 C)] 97.7 F (36.5 C) (03/14 0816) Pulse Rate:  [58-71] 58 (03/14 0816) Resp:  [16-22] 18 (03/14 0816) BP: (114-173)/(53-73) 137/58 (03/14 0816) SpO2:  [96 %-99 %] 99 % (03/14 0816)  Exam was limited as she did not want to be bothered.  General: Awake alert, appearing somewhat wanted. HEENT: Normocephalic atraumatic Lungs: Clear Cardiovascular: Regular rhythm Abdomen soft nondistended nontender Extremities warm well perfused with no edema Neurological exam Mental status: Awake alert oriented x3. Stated where she lives and grew up. Mentioned it is a very nice place.  She is able to follow all commands Fluent speech with good comprehension - Though limited and she ultimately stated she was not going to speak anymore during the encounter. Cranial nerves II to XII are grossly intact   Medications  Current Facility-Administered Medications:    0.9 %  sodium chloride infusion, , Intravenous, Continuous, Vann, Jessica U, DO, Last Rate: 50 mL/hr at 10/08/20 0528, Infusion Verify at 10/08/20 6789   acetaminophen (TYLENOL) suppository 650 mg, 650 mg, Rectal, Q6H PRN, Ivor Costa, MD   acetaminophen (TYLENOL) tablet 650 mg, 650 mg, Oral, Q6H PRN, Ivor Costa, MD   acyclovir (ZOVIRAX) 365 mg in dextrose 5 % 100 mL IVPB, 10 mg/kg, Intravenous, Q24H, Rauer, Forde Dandy, RPH, Stopped at 10/07/20 2009   albuterol (VENTOLIN HFA) 108 (90 Base) MCG/ACT inhaler 2 puff, 2 puff, Inhalation, Q4H PRN, Ivor Costa, MD   amLODipine (NORVASC) tablet 5 mg, 5 mg, Oral, Daily, Ivor Costa, MD, 5 mg at 10/07/20 3810    atenolol (TENORMIN) tablet 25 mg, 25 mg, Oral, Henreitta Cea, MD, 25 mg at 10/06/20 0934   calcium-vitamin D (OSCAL WITH D) 500-200 MG-UNIT per tablet 1 tablet, 1 tablet, Oral, BID, Ivor Costa, MD, 1 tablet at 10/07/20 2209   enoxaparin (LOVENOX) injection 30 mg, 30 mg, Subcutaneous, Q24H, Ivor Costa, MD, 30 mg at 10/07/20 2210   hydrALAZINE (APRESOLINE) injection 5 mg, 5 mg, Intravenous, Q2H PRN, Ivor Costa, MD   multivitamin-lutein (OCUVITE-LUTEIN) capsule 1 capsule, 1 capsule, Oral, Daily, Ivor Costa, MD, 1 capsule at 10/07/20 1751   naphazoline-glycerin (CLEAR EYES REDNESS) ophth solution 1 drop, 1 drop, Both Eyes, QID PRN, Ivor Costa, MD   omega-3 acid ethyl esters (LOVAZA) capsule 1 g, 1 g, Oral, Daily, Ivor Costa, MD, 1 g at 10/06/20 0933   ondansetron (ZOFRAN) injection 4 mg, 4 mg, Intravenous, Q8H PRN, Ivor Costa, MD   pravastatin (PRAVACHOL) tablet 20 mg, 20 mg, Oral, q1800, Ivor Costa, MD, 20 mg at 10/07/20 1910 Labs CBC    Component Value Date/Time   WBC 7.7 10/05/2020 0709   RBC 3.75 (L) 10/05/2020 0709   HGB 12.3 10/05/2020 0709   HGB 14.1 06/04/2012 1008   HCT 35.2 (L) 10/05/2020 0709   HCT 42.4 06/04/2012 1008   PLT 282 10/05/2020 0709   PLT 263 06/04/2012 1008   MCV 93.9 10/05/2020 0709   MCV 95 06/04/2012 1008   MCH 32.8  10/05/2020 0709   MCHC 34.9 10/05/2020 0709   RDW 11.8 10/05/2020 0709   RDW 12.5 06/04/2012 1008   LYMPHSABS 1.3 06/03/2012 1437   MONOABS 0.5 06/03/2012 1437   EOSABS 0.0 06/03/2012 1437   BASOSABS 0.0 06/03/2012 1437    CMP     Component Value Date/Time   NA 126 (L) 10/08/2020 0531   NA 138 06/04/2012 1008   K 4.3 10/08/2020 0531   K 5.4 (H) 06/04/2012 1008   CL 90 (L) 10/08/2020 0531   CL 103 06/04/2012 1008   CO2 30 10/08/2020 0531   CO2 24 06/04/2012 1008   GLUCOSE 82 10/08/2020 0531   GLUCOSE 124 (H) 06/04/2012 1008   BUN 18 10/08/2020 0531   BUN 14 06/04/2012 1008   CREATININE 0.71 10/08/2020 0531   CREATININE  0.63 06/04/2012 1008   CALCIUM 9.2 10/08/2020 0531   CALCIUM 8.8 06/04/2012 1008   PROT 6.7 10/04/2020 1019   PROT 7.6 06/04/2012 1008   ALBUMIN 3.9 10/04/2020 1019   ALBUMIN 3.5 06/04/2012 1008   AST 24 10/04/2020 1019   AST 48 (H) 06/04/2012 1008   ALT 20 10/04/2020 1019   ALT 20 06/04/2012 1008   ALKPHOS 58 10/04/2020 1019   ALKPHOS 95 06/04/2012 1008   BILITOT 0.9 10/04/2020 1019   BILITOT 0.9 06/04/2012 1008   GFRNONAA >60 10/08/2020 0531   GFRNONAA >60 06/04/2012 1008   GFRAA 90 01/11/2020 1415   GFRAA >60 06/04/2012 1008    Imaging I have reviewed images in epic and the results pertinent to this consultation are: CT head with no new acute changes  Assessment:  87 year old with past medical history of hypertension hyperlipidemia brought in for evaluation of increasing confusion. During initial exam she had normal affect and even laughed at a humorous anecdote however then decided she then stated she did not want to talk anymore, thus ending the encounter. She remains afebrile, no leukocytosis. Reviewing the chart and based on today's brief and limited encounter, her encephalopathy may be resolving however unable to fully determine. She continues to be hyponatremic which can casuse encephalopathy and not likely hyponatremic hypertensive syndrome. Though initial plan was to obtain MRI brain to rule out HSV encephalitis, she was very clear in that she did not want the MRI or any further testing.  Seen by psychiatry and her symptoms support delirium.  Impression: - Delirium. Encephalopathy in setting of UTI - Cannot rule out HSV encephalitis. - Hyponatremia - HTN - HLD - UTI - Resolved  Recommendations:  Family may be en route.  MRI of the brain - I would recommend using Ativan if needed-the hospitalist will attempt to convince her.  Continue current medical management per primary team as you are  Consider LP based on clinical course and MRI results.  Specifically  order HSV PCR, in addition to glucose, protein, cell count.  Empiric IV acyclovir -pharmacy consulted  EEG results pending.  Continue to reorient patient.  --  Lynnae Sandhoff, MD Page: [phone]

## 2020-10-08 NOTE — Progress Notes (Signed)
Progress Note    Angela Mason  BPZ:[id number] DOB: July 04, 1933  DOA: 10/04/2020 PCP: Albina Billet, MD    Brief Narrative:     Medical records reviewed and are as summarized below:  Angela Mason is an 87 y.o. female with medical history significant of hypertension, hyperlipidemia, COPD, hypothyroidism, cystocele, who presents with altered mental status.  Per report, and her normal baseline, patient is independent, normally alert, oriented, and can drive car. Pt was noted to be confused in the past severe days, with paranoia. She is not acting like herself.   She has poor appetite and decreased oral intake. She was diagnosed with UTI on 3/7 and started on cefuroxime.  Not responding to current treatment.  Await MRI.   Assessment/Plan:   Principal Problem:   Acute metabolic encephalopathy Active Problems:   HLD (hyperlipidemia)   HTN (hypertension)   Hypothyroidism   Hyponatremia   Hypertensive urgency   UTI (urinary tract infection)   AMS (altered mental status)   Acute metabolic encephalopathy: Likely multifactorial etiology, including hypertensive urgency, hyponatremia, UTI, possible PRES syndrome  CT head negative for acute intracranial abnormalities.  No focal neuro deficit on physical examination. -continues to be oriented to person/place/time but more paranoid and hostile/mis-trusting -Treat underlying issues as below -? PRES as BP was high when patient arrived in the hospital- MRI Pending but patient has not been cooperative -reports that family is on their way from [place] so will see if they can help convince patient  -neurology consult appreciated- no fever but pending MRI results there is ? Of HSV-- discussed with neurology and will start IV acyclovir, psych consult appreciated -EEG in AM  UTI: Patient was started on on cefuroxime 10/01/20, urinalysis negative -Will continue to treat with Rocephin for total of 5 days (including outpt abx) -culture negative  as treated outpatient -not impressed that this is the current cause of her issues  HLD (hyperlipidemia) -Pravastatin  HTN (hypertension) and hypertensive urgency: Blood pressure 207/92, 182/91 upon admission -continue home Atenolol-- HR in the 60s so not able to increase atenolol -add amlodipine 5 mg daily -much improved- appear to take her BP meds occasionally even in the hospital  H/o Hypothyroidism: not taking meds now - TSH normal  Hyponatremia: Na 124 upon admission  -Likely due to poor oral intake and dehydration.  -Urine Na >20 and urine osmo > 100 -- suspect SIADH so will fluid restrict and trend  -has been under increased stress with husband's illness -cortisol normal -TSH normal -Na trending back down, not sure if fluid restriction is being kept so will order strict I/Os  H&P says current alcohol use -patient not willing to clarify -will start CIWA and monitor   Family Communication/Anticipated D/C date and plan/Code Status   DVT prophylaxis: Lovenox ordered. Code Status: Full Code.  Family Communication: called home number- no answer; daughter for number is international and I was unable to complete via the phone system Disposition Plan: Status is: Inpatient  Remains inpatient appropriate because:Inpatient level of care appropriate due to severity of illness   Dispo: The patient is from: Home ILF              Anticipated d/c is to: Home              Patient currently is not medically stable to d/c. still not at baseline   Difficult to place patient No   Medical Consultants:    Neuro  psych    Subjective:  Say she will not talk to me as it is not safe  Objective:    Vitals:   10/07/20 1939 10/08/20 0341 10/08/20 0816 10/08/20 1213  BP: (!) 133/54 114/73 (!) 137/58 138/60  Pulse: 64 60 (!) 58 60  Resp: 16 16 18 18   Temp: (!) 97.5 F (36.4 C) 97.8 F (36.6 C) 97.7 F (36.5 C) 97.8 F (36.6 C)  TempSrc: Oral Oral Oral Oral  SpO2: 99%  98% 99% 99%  Weight:        Intake/Output Summary (Last 24 hours) at 10/08/2020 1515 Last data filed at 10/08/2020 0528 Gross per 24 hour  Intake 815.45 ml  Output 700 ml  Net 115.45 ml   Filed Weights   10/04/20 1640  Weight: 36.7 kg    Exam:  General: Appearance:    Thin female in no acute distress     Lungs:     respirations unlabored   Declined further physical exam          Data Reviewed:   I have personally reviewed following labs and imaging studies:  Labs: Labs show the following:   Basic Metabolic Panel: Recent Labs  Lab 10/04/20 2334 10/05/20 0710 10/06/20 0751 10/07/20 0542 10/08/20 0531  NA 128* 129* 132* 128* 126*  K 3.4* 3.7 4.0 4.1 4.3  CL 92* 95* 96* 92* 90*  CO2 28 28 29 28 30   GLUCOSE 87 91 123* 111* 82  BUN 15 14 12 11 18   CREATININE 0.63 0.59 0.51 0.50 0.71  CALCIUM 8.3* 8.9 9.1 9.2 9.2   GFR Estimated Creatinine Clearance: 29.8 mL/min (by C-G formula based on SCr of 0.71 mg/dL). Liver Function Tests: Recent Labs  Lab 10/04/20 1019  AST 24  ALT 20  ALKPHOS 58  BILITOT 0.9  PROT 6.7  ALBUMIN 3.9   No results for input(s): LIPASE, AMYLASE in the last 168 hours. Recent Labs  Lab 10/06/20 1009  AMMONIA <9*   Coagulation profile No results for input(s): INR, PROTIME in the last 168 hours.  CBC: Recent Labs  Lab 10/04/20 1019 10/05/20 0709  WBC 6.4 7.7  HGB 12.4 12.3  HCT 36.1 35.2*  MCV 93.0 93.9  PLT 273 282   Cardiac Enzymes: No results for input(s): CKTOTAL, CKMB, CKMBINDEX, TROPONINI in the last 168 hours. BNP (last 3 results) No results for input(s): PROBNP in the last 8760 hours. CBG: Recent Labs  Lab 10/06/20 0750 10/07/20 0819 10/07/20 1147 10/07/20 1649 10/08/20 0752  GLUCAP 125* 127* 111* 94 91   D-Dimer: No results for input(s): DDIMER in the last 72 hours. Hgb A1c: No results for input(s): HGBA1C in the last 72 hours. Lipid Profile: No results for input(s): CHOL, HDL, LDLCALC, TRIG, CHOLHDL,  LDLDIRECT in the last 72 hours. Thyroid function studies: No results for input(s): TSH, T4TOTAL, T3FREE, THYROIDAB in the last 72 hours.  Invalid input(s): FREET3 Anemia work up: Recent Labs    10/06/20 1009  VITAMINB12 355   Sepsis Labs: Recent Labs  Lab 10/04/20 1019 10/05/20 0709  WBC 6.4 7.7    Microbiology Recent Results (from the past 240 hour(s))  Urine culture     Status: None   Collection Time: 10/04/20 12:30 PM   Specimen: Urine, Random  Result Value Ref Range Status   Specimen Description   Final    URINE, RANDOM Performed at [facility], [address], [place], [place] [address]    Special Requests   Final    NONE Performed at  [facility], [address] [place], [place] [address]    Culture   Final    NO GROWTH Performed at [facility], [place] [address], [place], [place] [address]    Report Status 10/05/2020 FINAL  Final  Resp Panel by RT-PCR (Flu A&B, Covid) Nasopharyngeal Swab     Status: None   Collection Time: 10/04/20  2:22 PM   Specimen: Nasopharyngeal Swab; Nasopharyngeal(NP) swabs in vial transport medium  Result Value Ref Range Status   SARS Coronavirus 2 by RT PCR NEGATIVE NEGATIVE Final    Comment: (NOTE) SARS-CoV-2 target nucleic acids are NOT DETECTED.  The SARS-CoV-2 RNA is generally detectable in upper respiratory specimens during the acute phase of infection. The lowest concentration of SARS-CoV-2 viral copies this assay can detect is 138 copies/mL. A negative result does not preclude SARS-Cov-2 infection and should not be used as the sole basis for treatment or other patient management decisions. A negative result may occur with  improper specimen collection/handling, submission of specimen other than nasopharyngeal swab, presence of viral mutation(s) within the areas targeted by this assay, and inadequate number of viral copies(<138 copies/mL). A negative result must be combined with clinical  observations, patient history, and epidemiological information. The expected result is Negative.  Fact Sheet for Patients:  EntrepreneurPulse.com.au  Fact Sheet for Healthcare Providers:  IncredibleEmployment.be  This test is no t yet approved or cleared by the [place] FDA and  has been authorized for detection and/or diagnosis of SARS-CoV-2 by FDA under an Emergency Use Authorization (EUA). This EUA will remain  in effect (meaning this test can be used) for the duration of the COVID-19 declaration under Section 564(b)(1) of the Act, 21 U.S.C.section 360bbb-3(b)(1), unless the authorization is terminated  or revoked sooner.       Influenza A by PCR NEGATIVE NEGATIVE Final   Influenza B by PCR NEGATIVE NEGATIVE Final    Comment: (NOTE) The Xpert Xpress SARS-CoV-2/FLU/RSV plus assay is intended as an aid in the diagnosis of influenza from Nasopharyngeal swab specimens and should not be used as a sole basis for treatment. Nasal washings and aspirates are unacceptable for Xpert Xpress SARS-CoV-2/FLU/RSV testing.  Fact Sheet for Patients: EntrepreneurPulse.com.au  Fact Sheet for Healthcare Providers: IncredibleEmployment.be  This test is not yet approved or cleared by the [place] FDA and has been authorized for detection and/or diagnosis of SARS-CoV-2 by FDA under an Emergency Use Authorization (EUA). This EUA will remain in effect (meaning this test can be used) for the duration of the COVID-19 declaration under Section 564(b)(1) of the Act, 21 U.S.C. section 360bbb-3(b)(1), unless the authorization is terminated or revoked.  Performed at [facility], [address] [place], [place] [address]     Procedures and diagnostic studies:  No results found.  Medications:   . amLODipine  5 mg Oral Daily  . atenolol  25 mg Oral QODAY  . calcium-vitamin D  1 tablet Oral BID  .  enoxaparin (LOVENOX) injection  30 mg Subcutaneous Q24H  . multivitamin-lutein  1 capsule Oral Daily  . omega-3 acid ethyl esters  1 g Oral Daily  . pravastatin  20 mg Oral q1800   Continuous Infusions: . sodium chloride 50 mL/hr at 10/08/20 0528  . acyclovir 365 mg (10/08/20 1152)     LOS: 3 days   Geradine Girt  [facility]   How to contact the [facility] Attending or Consulting provider [place] or covering provider during after hours  7P -7A, for this patient?  1. Check the care team in [facility] and look for a) attending/consulting TRH provider listed and b) the [facility] team listed 2. Log into [email or web address] and use 's universal password to access. If you do not have the password, please contact the hospital operator. 3. Locate the [facility] provider you are looking for under [facility] and page to a number that you can be directly reached. 4. If you still have difficulty reaching the provider, please page the [facility] (Director on Call) for the Hospitalists listed on amion for assistance.  10/08/2020, 3:15 PM

## 2020-10-08 NOTE — Progress Notes (Signed)
OT Cancellation Note  Patient Details Name: Kariss Longmire MRN: [id number] DOB: 03-27-1933   Cancelled Treatment:    Reason Eval/Treat Not Completed: Patient declined, no reason specified. As soon as therapist enters the room, pt states, " No, I am not doing any therapy today". Pt reports she does not want bed alarms to go off and she can't get up from bed. OT explaining to pt that pt's in hospital have bed alarm for safety so we can assist them so they do not fall and offered to take off alarm for session. Pt continues to refuse. OT also inquired about pt's pain, toileting, and any other need the pt may have and she replied, " no" to each question. OT will re-attempt at next available time.   Darleen Crocker, MS, OTR/L , CBIS ascom [phone]  10/08/20, 3:15 PM   10/08/2020, 3:12 PM

## 2020-10-08 NOTE — Plan of Care (Signed)

## 2020-10-08 NOTE — Consult Note (Signed)
PHARMACY NOTE:  ANTIMICROBIAL RENAL DOSAGE ADJUSTMENT  Current antimicrobial regimen includes a mismatch between antimicrobial dosage and estimated renal function.  As per policy approved by the Pharmacy & Therapeutics and Medical Executive Committees, the antimicrobial dosage will be adjusted accordingly.  Current antimicrobial dosage:  Acyclovir 10 mg/kg q24H   Indication: HSV encephalitic   Renal Function:  Estimated Creatinine Clearance: 29.8 mL/min (by C-G formula based on SCr of 0.71 mg/dL). []      On intermittent HD, scheduled: []      On CRRT    Antimicrobial dosage has been changed to:  Acyclovir 10 mg/kg q12H (CrCl > 25 ml/min)    Thank you for allowing pharmacy to be a part of this patient's care.  Oswald Hillock, [facility] 10/08/2020 9:38 AM

## 2020-10-09 DIAGNOSIS — G9340 Encephalopathy, unspecified: Secondary | ICD-10-CM | POA: Diagnosis not present

## 2020-10-09 DIAGNOSIS — I10 Essential (primary) hypertension: Secondary | ICD-10-CM | POA: Diagnosis not present

## 2020-10-09 DIAGNOSIS — G9341 Metabolic encephalopathy: Secondary | ICD-10-CM | POA: Diagnosis not present

## 2020-10-09 MED ORDER — ATENOLOL 25 MG PO TABS
25.0000 mg | ORAL_TABLET | Freq: Every day | ORAL | Status: DC
  Administered 2020-10-10 – 2020-10-16 (×7): 25 mg via ORAL
  Filled 2020-10-09 (×7): qty 1

## 2020-10-09 MED ORDER — LORAZEPAM 2 MG/ML IJ SOLN
1.0000 mg | Freq: Once | INTRAMUSCULAR | Status: AC
  Administered 2020-10-10: 1 mg via INTRAVENOUS
  Filled 2020-10-09: qty 1

## 2020-10-09 NOTE — Progress Notes (Signed)
PT Cancellation Note  Patient Details Name: Angela Mason MRN: [id number] DOB: 03-22-1933   Cancelled Treatment:     PT attempt. Pt was awake sitting up in bed upon arriving. She would not make eye contact or talk to therapist however did nod yes/no to simple questions. Unwilling to participate in PT even with max encouragement. Acute PT will continue efforts as able per POC.    Willette Pa 10/09/2020, 12:53 PM

## 2020-10-09 NOTE — Progress Notes (Addendum)
Neurology Progress Note   S:// Seen and examined. No acute changes.  O:// Current vital signs: BP 138/60    Pulse 60    Temp 97.8 F (36.6 C) (Oral)    Resp 18    Wt 36.7 kg    SpO2 99%    BMI 16.60 kg/m  Vital signs in last 24 hours:   Exam: General: sitting up in bed NAD. HEENT: Normocephalic atraumatic Lungs: unlabored Cardiovascular: Regular rhythm Abdomen soft nondistended nontender Extremities warm well perfused with no edema Neurological exam Mental status: Awake alert oriented x3. She is able to follow all commands Fluent speech with good comprehension. Cranial nerves II to XII are grossly intact. Depressed mood - When speaking about her husband, she became tearful.   Medications  Current Facility-Administered Medications:    0.9 %  sodium chloride infusion, , Intravenous, Continuous, Geradine Girt, DO, Last Rate: 50 mL/hr at 10/09/20 0326, Infusion Verify at 10/09/20 0326   acetaminophen (TYLENOL) suppository 650 mg, 650 mg, Rectal, Q6H PRN, Ivor Costa, MD   acetaminophen (TYLENOL) tablet 650 mg, 650 mg, Oral, Q6H PRN, Ivor Costa, MD   acyclovir (ZOVIRAX) 365 mg in dextrose 5 % 100 mL IVPB, 10 mg/kg, Intravenous, Q12H, Oswald Hillock, RPH, Last Rate: 107.3 mL/hr at 10/09/20 1013, 365 mg at 10/09/20 1013   albuterol (VENTOLIN HFA) 108 (90 Base) MCG/ACT inhaler 2 puff, 2 puff, Inhalation, Q4H PRN, Ivor Costa, MD   [START ON 10/10/2020] atenolol (TENORMIN) tablet 25 mg, 25 mg, Oral, Daily, Vann, Jessica U, DO   calcium-vitamin D (OSCAL WITH D) 500-200 MG-UNIT per tablet 1 tablet, 1 tablet, Oral, BID, Ivor Costa, MD, 1 tablet at 10/07/20 2209   enoxaparin (LOVENOX) injection 30 mg, 30 mg, Subcutaneous, Q24H, Ivor Costa, MD, 30 mg at 16/07/37 1062   folic acid (FOLVITE) tablet 1 mg, 1 mg, Oral, Daily, Vann, Jessica U, DO   hydrALAZINE (APRESOLINE) injection 5 mg, 5 mg, Intravenous, Q2H PRN, Ivor Costa, MD   LORazepam (ATIVAN) injection 1 mg, 1 mg,  Intravenous, Once, Vann, Jessica U, DO   LORazepam (ATIVAN) tablet 1-4 mg, 1-4 mg, Oral, Q1H PRN **OR** LORazepam (ATIVAN) injection 1-4 mg, 1-4 mg, Intravenous, Q1H PRN, Vann, Jessica U, DO   multivitamin with minerals tablet 1 tablet, 1 tablet, Oral, Daily, Vann, Jessica U, DO   multivitamin-lutein (OCUVITE-LUTEIN) capsule 1 capsule, 1 capsule, Oral, Daily, Ivor Costa, MD, 1 capsule at 10/07/20 6948   naphazoline-glycerin (CLEAR EYES REDNESS) ophth solution 1 drop, 1 drop, Both Eyes, QID PRN, Ivor Costa, MD   omega-3 acid ethyl esters (LOVAZA) capsule 1 g, 1 g, Oral, Daily, Ivor Costa, MD, 1 g at 10/06/20 0933   ondansetron (ZOFRAN) injection 4 mg, 4 mg, Intravenous, Q8H PRN, Ivor Costa, MD   pravastatin (PRAVACHOL) tablet 20 mg, 20 mg, Oral, q1800, Ivor Costa, MD, 20 mg at 10/07/20 1910   QUEtiapine (SEROQUEL) tablet 12.5 mg, 12.5 mg, Oral, Q6H PRN, Vann, Jessica U, DO   thiamine tablet 100 mg, 100 mg, Oral, Daily **OR** thiamine (B-1) injection 100 mg, 100 mg, Intravenous, Daily, Geradine Girt, DO Labs CBC    Component Value Date/Time   WBC 7.7 10/05/2020 0709   RBC 3.75 (L) 10/05/2020 0709   HGB 12.3 10/05/2020 0709   HGB 14.1 06/04/2012 1008   HCT 35.2 (L) 10/05/2020 0709   HCT 42.4 06/04/2012 1008   PLT 282 10/05/2020 0709   PLT 263 06/04/2012 1008   MCV 93.9 10/05/2020 0709   MCV  95 06/04/2012 1008   MCH 32.8 10/05/2020 0709   MCHC 34.9 10/05/2020 0709   RDW 11.8 10/05/2020 0709   RDW 12.5 06/04/2012 1008   LYMPHSABS 1.3 06/03/2012 1437   MONOABS 0.5 06/03/2012 1437   EOSABS 0.0 06/03/2012 1437   BASOSABS 0.0 06/03/2012 1437   CMP     Component Value Date/Time   NA 126 (L) 10/08/2020 0531   NA 138 06/04/2012 1008   K 4.3 10/08/2020 0531   K 5.4 (H) 06/04/2012 1008   CL 90 (L) 10/08/2020 0531   CL 103 06/04/2012 1008   CO2 30 10/08/2020 0531   CO2 24 06/04/2012 1008   GLUCOSE 82 10/08/2020 0531   GLUCOSE 124 (H) 06/04/2012 1008   BUN 18 10/08/2020 0531    BUN 14 06/04/2012 1008   CREATININE 0.71 10/08/2020 0531   CREATININE 0.63 06/04/2012 1008   CALCIUM 9.2 10/08/2020 0531   CALCIUM 8.8 06/04/2012 1008   PROT 6.7 10/04/2020 1019   PROT 7.6 06/04/2012 1008   ALBUMIN 3.9 10/04/2020 1019   ALBUMIN 3.5 06/04/2012 1008   AST 24 10/04/2020 1019   AST 48 (H) 06/04/2012 1008   ALT 20 10/04/2020 1019   ALT 20 06/04/2012 1008   ALKPHOS 58 10/04/2020 1019   ALKPHOS 95 06/04/2012 1008   BILITOT 0.9 10/04/2020 1019   BILITOT 0.9 06/04/2012 1008   GFRNONAA >60 10/08/2020 0531   GFRNONAA >60 06/04/2012 1008   GFRAA 90 01/11/2020 1415   GFRAA >60 06/04/2012 1008   Imaging I have reviewed images in epic and the results pertinent to this consultation are: CT head with no new acute changes  Assessment:  87 year old with past medical history of hypertension, hyperlipidemia brought in for evaluation of increasing confusion. During the exam she had normal affect, again laughed at a humorous anecdote. She spoke about her children and her husband of 31 years who is her best friend and became tearful. She said he is in rehabilitation and she does not want him to know what is happening to her for fear it may hurt him. Encounter was mostly coherent however, occasionally the patient did exhibit paranoid ideations such as "They want me to get the tests" and "They are focused on COVID but I had my immunizations." When asked who "they" are she would not elaborate.  Initial thoughts were possibly HSV encephalitis and less likely, cannot completely exclude. Moreover, the patient has repeatedly refused MRI and EEG. She remains afebrile, no leukocytosis and less not likely hyponatremic hypertensive syndrome.   She continues to be hyponatremic, and profound hyponatremia (<125 mmol/L) is well known to cause psychotic behavior as well as vegetative symptoms, though she does not meet criteria, it may be contributing.  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 Paranoid ideations. Encephalopathy in setting of UTI. Hyponatremia. HTN. HLD. UTI - Resolved.  Recommendations:   At this point will not pursue EEG or MRI due to patient's wishes.  Daughter is on the way and her flight should arrive tomorrow.  Continue current medical management per primary team.  Continue empiric IV acyclovir for now.  Continue management of hyponatremia.  Continue to reorient patient.  --  Lynnae Sandhoff, MD Page: [phone]

## 2020-10-09 NOTE — TOC Progression Note (Signed)
Transition of Care [facility]) - Progression Note    Patient Details  Name: Angela Mason MRN: [id number] Date of Birth: 11/15/1932  Transition [facility] CM/SW Contact  Beverly Sessions, RN Phone Number: 10/09/2020, 10:29 AM  Clinical Narrative:    Damaris Schooner with Seth Bake at [facility].  Per Seth Bake patient lives in the independent living side, and is independent and drives.  Patient;s husband has been in the skilled side for about 3 weeks.  During that time they have noticed the patient's paranoid behavior increase.  At one point she "took her pants off and refused to put them back on because [place] her pants"   Per Seth Bake at this time patient is not appropriate to return to the independent or skilled side of [place] Rodriguez confirmed that patient's daughter is on the way in from [place], and her flight should arrive tomorrow    Expected Discharge Plan: [place] Barriers to Discharge: Continued Medical Work up  Expected Discharge Plan and Services Expected Discharge Plan: [place] In-house Referral: Clinical Social Work     Living arrangements for the past 2 months: [place]: PT,OT [place] Agency: [place] ([place]) Date [place]: 10/07/20 Time [place]: 1706 Representative spoke with at [place]: [place] ([place]) Interventions    Readmission Risk Interventions No flowsheet data found.

## 2020-10-09 NOTE — Progress Notes (Signed)
Patient refused all vital sign checks and po meds. Seemed paranoid, combative, and agitated when staff tried to attempt nursing duties. She did not sleep all night but remained in bed and calm when not being interacted with.

## 2020-10-09 NOTE — Progress Notes (Signed)
Progress Note    Alauna Hayden  ZOX:[id number] DOB: [birth year]  DOA: 10/04/2020 PCP: Albina Billet, MD    Brief Narrative:     Medical records reviewed and are as summarized below:  Angela Mason is an 87 y.o. female with medical history significant of hypertension, hyperlipidemia, COPD, hypothyroidism, cystocele, who presents with altered mental status.  Per report, and her normal baseline, patient is independent, normally alert, oriented, and can drive car. Pt was noted to be confused in the past severe days, with paranoia. She is not acting like herself.   She has poor appetite and decreased oral intake. She was diagnosed with UTI on 3/7 and started on cefuroxime.  Not responding to current treatment.  Await MRI -- will have to have daughter her (flying from [place]).  Trial of IV ativan to see if this calms patient.  Chart review also shows alcohol use-- unclear amounts so CIWA started.   Assessment/Plan:   Principal Problem:   Acute metabolic encephalopathy Active Problems:   HLD (hyperlipidemia)   HTN (hypertension)   Hypothyroidism   Hyponatremia   Hypertensive urgency   UTI (urinary tract infection)   AMS (altered mental status)   Acute metabolic encephalopathy: Likely multifactorial etiology, including hypertensive urgency, hyponatremia, UTI, possible PRES syndrome  CT head negative for acute intracranial abnormalities.  No focal neuro deficit on physical examination. -continues to be oriented to person/place/time but more paranoid and hostile/mis-trusting -Treat underlying issues as below -? PRES as BP was high when patient arrived in the hospital- MRI Pending but patient has not been cooperative -reports that daughter is on her way from [place] so will see if they can help convince patient  -trial of IV ativan -neurology consult appreciated- no fever but pending MRI results there is ? Of HSV-- discussed with neurology and will start IV acyclovir, psych consult  appreciated -EEG pending -may need to re-consult psych for further recommendations-- sometimes refusing PO so may need IM or IV  UTI: Patient was started on on cefuroxime 10/01/20, urinalysis negative -treated for a total of 5 days (including outpt abx) -culture negative as treated outpatient -not impressed that this is the current cause of her issues  HLD (hyperlipidemia) -Pravastatin  HTN (hypertension) and hypertensive urgency: Blood pressure 207/92, 182/91 upon admission -continue home Atenolol but change to daily-- HR in the 60s so not able to increase atenolol -much improved- appear to take her BP meds occasionally even in the hospital  H/o Hypothyroidism: not taking meds now - TSH normal  Hyponatremia: Na 124 upon admission  -Likely due to poor oral intake and dehydration.  -Urine Na >20 and urine osmo > 100 -- suspect SIADH so will fluid restrict and trend  -has been under increased stress with husband's illness -cortisol normal -TSH normal -Na trending back down- await labs from 3/15  H&P says current alcohol use -patient not willing to clarify -will start CIWA and monitor   Family Communication/Anticipated D/C date and plan/Code Status   DVT prophylaxis: Lovenox ordered. Code Status: Full Code.  Family Communication: called home number- no answer; daughter for number is international and I was unable to complete via the phone system- I was told she is on her way here Disposition Plan: Status is: Inpatient  Remains inpatient appropriate because:Inpatient level of care appropriate due to severity of illness   Dispo: The patient is from: Home ILF              Anticipated d/c is to:  Home              Patient currently is not medically stable to d/c. still not at baseline   Difficult to place patient No   Medical Consultants:    Neuro  psych    Subjective:   Still refusing to speak with me  Objective:    Vitals:   10/07/20 1939 10/08/20 0341  10/08/20 0816 10/08/20 1213  BP: (!) 133/54 114/73 (!) 137/58 138/60  Pulse: 64 60 (!) 58 60  Resp: 16 16 18 18   Temp:  97.8 F (36.6 C) 97.7 F (36.5 C) 97.8 F (36.6 C)  TempSrc: Oral Oral Oral Oral  SpO2: 99% 98% 99% 99%  Weight:        Intake/Output Summary (Last 24 hours) at 10/09/2020 1343 Last data filed at 10/09/2020 0900 Gross per 24 hour  Intake 788.79 ml  Output -  Net 788.79 ml   Filed Weights   10/04/20 1640  Weight: 36.7 kg    Exam:  General: Appearance:    Thin female in no acute distress- poor eye contact              Neurologic:   Awake, not cooperative                       Data Reviewed:   I have personally reviewed following labs and imaging studies:  Labs: Labs show the following:   Basic Metabolic Panel: Recent Labs  Lab 10/04/20 2334 10/05/20 0710 10/06/20 0751 10/07/20 0542 10/08/20 0531  NA 128* 129* 132* 128* 126*  K 3.4* 3.7 4.0 4.1 4.3  CL 92* 95* 96* 92* 90*  CO2 28 28 29 28 30   GLUCOSE 87 91 123* 111* 82  BUN 15 14 12 11 18   CREATININE 0.63 0.59 0.51 0.50 0.71  CALCIUM 8.3* 8.9 9.1 9.2 9.2   GFR Estimated Creatinine Clearance: 29.8 mL/min (by C-G formula based on SCr of 0.71 mg/dL). Liver Function Tests: Recent Labs  Lab 10/04/20 1019  AST 24  ALT 20  ALKPHOS 58  BILITOT 0.9  PROT 6.7  ALBUMIN 3.9   No results for input(s): LIPASE, AMYLASE in the last 168 hours. Recent Labs  Lab 10/06/20 1009  AMMONIA <9*   Coagulation profile No results for input(s): INR, PROTIME in the last 168 hours.  CBC: Recent Labs  Lab 10/04/20 1019 10/05/20 0709  WBC 6.4 7.7  HGB 12.4 12.3  HCT 36.1 35.2*  MCV 93.0 93.9  PLT 273 282   Cardiac Enzymes: No results for input(s): CKTOTAL, CKMB, CKMBINDEX, TROPONINI in the last 168 hours. BNP (last 3 results) No results for input(s): PROBNP in the last 8760 hours. CBG: Recent Labs  Lab 10/06/20 0750 10/07/20 0819 10/07/20 1147 10/07/20 1649 10/08/20 0752   GLUCAP 125* 127* 111* 94 91   D-Dimer: No results for input(s): DDIMER in the last 72 hours. Hgb A1c: No results for input(s): HGBA1C in the last 72 hours. Lipid Profile: No results for input(s): CHOL, HDL, LDLCALC, TRIG, CHOLHDL, LDLDIRECT in the last 72 hours. Thyroid function studies: No results for input(s): TSH, T4TOTAL, T3FREE, THYROIDAB in the last 72 hours.  Invalid input(s): FREET3 Anemia work up: No results for input(s): VITAMINB12, FOLATE, FERRITIN, TIBC, IRON, RETICCTPCT in the last 72 hours. Sepsis Labs: Recent Labs  Lab 10/04/20 1019 10/05/20 0709  WBC 6.4 7.7    Microbiology Recent Results (from the past 240 hour(s))  Urine culture  Status: None   Collection Time: 10/04/20 12:30 PM   Specimen: Urine, Random  Result Value Ref Range Status   Specimen Description   Final    URINE, RANDOM Performed at [facility], [address] [place], [place] [address]    Special Requests   Final    NONE Performed at [facility], [address] [place], [place] [address]    Culture   Final    NO GROWTH Performed at [facility], [place] [address], [place], [place] [address]    Report Status 10/05/2020 FINAL  Final  Resp Panel by RT-PCR (Flu A&B, Covid) Nasopharyngeal Swab     Status: None   Collection Time: 10/04/20  2:22 PM   Specimen: Nasopharyngeal Swab; Nasopharyngeal(NP) swabs in vial transport medium  Result Value Ref Range Status   SARS Coronavirus 2 by RT PCR NEGATIVE NEGATIVE Final    Comment: (NOTE) SARS-CoV-2 target nucleic acids are NOT DETECTED.  The SARS-CoV-2 RNA is generally detectable in upper respiratory specimens during the acute phase of infection. The lowest concentration of SARS-CoV-2 viral copies this assay can detect is 138 copies/mL. A negative result does not preclude SARS-Cov-2 infection and should not be used as the sole basis for treatment or other patient management decisions. A negative result may occur  with  improper specimen collection/handling, submission of specimen other than nasopharyngeal swab, presence of viral mutation(s) within the areas targeted by this assay, and inadequate number of viral copies(<138 copies/mL). A negative result must be combined with clinical observations, patient history, and epidemiological information. The expected result is Negative.  Fact Sheet for Patients:  EntrepreneurPulse.com.au  Fact Sheet for Healthcare Providers:  IncredibleEmployment.be  This test is no t yet approved or cleared by the [place] FDA and  has been authorized for detection and/or diagnosis of SARS-CoV-2 by FDA under an Emergency Use Authorization (EUA). This EUA will remain  in effect (meaning this test can be used) for the duration of the COVID-19 declaration under Section 564(b)(1) of the Act, 21 U.S.C.section 360bbb-3(b)(1), unless the authorization is terminated  or revoked sooner.       Influenza A by PCR NEGATIVE NEGATIVE Final   Influenza B by PCR NEGATIVE NEGATIVE Final    Comment: (NOTE) The Xpert Xpress SARS-CoV-2/FLU/RSV plus assay is intended as an aid in the diagnosis of influenza from Nasopharyngeal swab specimens and should not be used as a sole basis for treatment. Nasal washings and aspirates are unacceptable for Xpert Xpress SARS-CoV-2/FLU/RSV testing.  Fact Sheet for Patients: EntrepreneurPulse.com.au  Fact Sheet for Healthcare Providers: IncredibleEmployment.be  This test is not yet approved or cleared by the [place] FDA and has been authorized for detection and/or diagnosis of SARS-CoV-2 by FDA under an Emergency Use Authorization (EUA). This EUA will remain in effect (meaning this test can be used) for the duration of the COVID-19 declaration under Section 564(b)(1) of the Act, 21 U.S.C. section 360bbb-3(b)(1), unless the authorization is terminated  or revoked.  Performed at [facility], [address] [place], [place] [address]     Procedures and diagnostic studies:  No results found.  Medications:   . amLODipine  5 mg Oral Daily  . atenolol  25 mg Oral QODAY  . calcium-vitamin D  1 tablet Oral BID  . enoxaparin (LOVENOX) injection  30 mg Subcutaneous Q24H  . folic acid  1 mg Oral Daily  . LORazepam  1 mg Intravenous Once  . multivitamin with minerals  1 tablet Oral Daily  . multivitamin-lutein  1 capsule Oral Daily  . omega-3 acid ethyl esters  1 g Oral Daily  . pravastatin  20 mg Oral q1800  . thiamine  100 mg Oral Daily   Or  . thiamine  100 mg Intravenous Daily   Continuous Infusions: . sodium chloride 50 mL/hr at 10/09/20 0326  . acyclovir Stopped (10/08/20 2218)     LOS: 4 days   Geradine Girt  [facility]   How to contact the [facility] Attending or Consulting provider [place] or covering provider during after hours [place], for this patient?  1. Check the care team in [facility] and look for a) attending/consulting TR[facility] provider listed and b) the [facility] team listed 2. Log into [email or web address] and use [place]'s universal password to access. If you do not have the password, please contact the hospital operator. 3. Locate the [facility] provider you are looking for under [facility] and page to a number that you can be directly reached. 4. If you still have difficulty reaching the provider, please page the [facility] (Director on Call) for the Hospitalists listed on amion for assistance.  10/09/2020, 1:43 PM

## 2020-10-10 ENCOUNTER — Inpatient Hospital Stay: Payer: Medicare Other

## 2020-10-10 DIAGNOSIS — G9340 Encephalopathy, unspecified: Secondary | ICD-10-CM | POA: Diagnosis not present

## 2020-10-10 DIAGNOSIS — R410 Disorientation, unspecified: Secondary | ICD-10-CM | POA: Diagnosis not present

## 2020-10-10 DIAGNOSIS — I10 Essential (primary) hypertension: Secondary | ICD-10-CM | POA: Diagnosis not present

## 2020-10-10 LAB — VITAMIN B12: Vitamin B-12: 1225 pg/mL — ABNORMAL HIGH (ref 180–914)

## 2020-10-10 LAB — BASIC METABOLIC PANEL
Anion gap: 6 (ref 5–15)
BUN: 15 mg/dL (ref 8–23)
CO2: 26 mmol/L (ref 22–32)
Calcium: 8.5 mg/dL — ABNORMAL LOW (ref 8.9–10.3)
Chloride: 97 mmol/L — ABNORMAL LOW (ref 98–111)
Creatinine, Ser: 0.64 mg/dL (ref 0.44–1.00)
GFR, Estimated: >60 mL/min (ref >60)
Glucose, Bld: 73 mg/dL (ref 70–99)
Potassium: 4.2 mmol/L (ref 3.5–5.1)
Sodium: 129 mmol/L — ABNORMAL LOW (ref 135–145)

## 2020-10-10 LAB — GLUCOSE, CAPILLARY: Glucose-Capillary: 106 mg/dL — ABNORMAL HIGH (ref 70–99)

## 2020-10-10 LAB — VITAMIN D 25 HYDROXY (VIT D DEFICIENCY, FRACTURES): Vit D, 25-Hydroxy: 17.78 ng/mL — ABNORMAL LOW (ref 30–100)

## 2020-10-10 MED ORDER — MELATONIN 5 MG PO TABS
5.0000 mg | ORAL_TABLET | Freq: Every evening | ORAL | Status: DC
  Administered 2020-10-11 – 2020-10-15 (×5): 5 mg via ORAL
  Filled 2020-10-10 (×5): qty 1

## 2020-10-10 MED ORDER — QUETIAPINE FUMARATE 25 MG PO TABS
25.0000 mg | ORAL_TABLET | Freq: Two times a day (BID) | ORAL | Status: DC
  Administered 2020-10-11 (×2): 25 mg via ORAL
  Filled 2020-10-10 (×2): qty 1

## 2020-10-10 MED ORDER — GADOBUTROL 1 MMOL/ML IV SOLN
4.0000 mL | Freq: Once | INTRAVENOUS | Status: AC | PRN
  Administered 2020-10-10: 4 mL via INTRAVENOUS

## 2020-10-10 MED ORDER — SODIUM CHLORIDE 1 G PO TABS
1.0000 g | ORAL_TABLET | Freq: Three times a day (TID) | ORAL | Status: AC
  Administered 2020-10-10 – 2020-10-12 (×5): 1 g via ORAL
  Filled 2020-10-10 (×6): qty 1
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
## 2020-10-10 NOTE — Progress Notes (Signed)
Pt refused V/S check

## 2020-10-10 NOTE — Procedures (Signed)
Pt refused eeg

## 2020-10-10 NOTE — Progress Notes (Signed)
[facility] Progress Note  Patient: Angela Mason    NKN:[id number]  DOA: 10/04/2020     Date of Service: the patient was seen and examined on 10/10/2020  Chief Complaint  Patient presents with  . Altered Mental Status  . UTI   Brief hospital course: Angela Mason is an 87 y.o. female with medical history significant ofhypertension, hyperlipidemia, COPD, hypothyroidism, cystocele, who presents with altered mental status. Per report, and her normal baseline, patient is independent,normally alert, oriented, and candrive car. Pt was noted to be confused in the past severe days, withparanoia. She is not acting like herself.She has poor appetite and decreased oral intake. She was diagnosed with UTI on 3/7 and started on cefuroxime. Not responding to current treatment.  Await MRI -- will have to have daughter her (flying from [place]).  Trial of IV ativan to see if this calms patient.  Chart review also shows alcohol use-- unclear amounts so CIWA started.   Assessment and Plan: Principal Problem:   Acute metabolic encephalopathy Active Problems:   HLD (hyperlipidemia)   HTN (hypertension)   Hypothyroidism   Hyponatremia   Hypertensive urgency   UTI (urinary tract infection)   AMS (altered mental status)   Acute metabolic encephalopathy:Likely multifactorial etiology, including hypertensive urgency, hyponatremia, UTI, possible PRES syndrome CT head negative for acute intracranial abnormalities. No focal neuro deficit on physical examination. -continues to be oriented to person/place/time but more paranoid and hostile/mis-trusting -Treat underlying issues as below -? PRES as BP was high when patient arrived in the hospital- MRI Pending but patient has not been cooperative -reports that daughter is on her way from [place] so will see if they can help convince patient  -trial of IV ativan -neurology consult appreciated- no fever but pending MRI results there is ? Of  HSV-- discussed with neurology and started IV acyclovir, psych consult appreciated -EEG pending -may need to re-consult psych for further recommendations-- sometimes refusing PO so may need IM or IV 3/16 started Seroquel 25 mg p.o. twice daily, continue Seroquel 12.5 mg p.o. every 6 hourly as needed for acute agitation   UTI: Patient was started onon cefuroxime3/7/22, urinalysis negative -treated for a total of 5 days (including outpt abx) -culture negative as treated outpatient -not impressed that this is the current cause of her issues  HLD (hyperlipidemia) -Pravastatin  HTN (hypertension)and hypertensive urgency: Blood pressure 207/92, 182/91 upon admission -continue homeAtenolol but change to daily-- HR in the 60s so not able to increase atenolol -much improved- appear to take her BP meds occasionally even in the hospital  H/o Hypothyroidism: not taking meds now - TSH normal  Hyponatremia:Na 124 upon admission  -Likely due topoor oral intake and dehydration. -Urine Na >20 and urine osmo > 100 -- suspect SIADH so will fluid restrict and trend  -has been under increased stress with husband's illness -cortisol normal -TSH normal -Na 129  Started regular diet deliberate salt, salt tablet 1 g p.o. 3 times daily for 2 days Monitor sodium level daily   H&P says current alcohol use -patient not willing to clarify -Continue CIWA and monitor    Body mass index is 16.6 kg/m.  Interventions:        Diet: Regular diet DVT Prophylaxis: Subcutaneous Lovenox   Advance goals of care discussion: Full code  Family Communication: family was NOTpresent at bedside, at the time of interview.  The pt provided permission to discuss medical plan with the family. Opportunity was given to ask question and all questions  were answered satisfactorily.  3/16 D/w daughter over the phone  Disposition:  Pt is from independent living facility, admitted with acute metabolic  encephalopathy and delusions, still has delusions, which precludes a safe discharge. Discharge to independent living, when her mental status will improve and facility agrees to take her back.  3/16 patient agreed for MRI brain and EEG which will be done, we will follow psych and then decide for disposition  Subjective: No significant overnight issues, patient stated that someone has taken her credit card and she noticed a nurse coming multiple times in the room.  Patient denied any headache or dizziness, no chest pain or shortness of breath, no abdominal pain, no nausea vomiting or diarrhea.   Physical Exam: General:  alert oriented to time, place, and person.  Appear in mild distress, affect anxious Eyes: PERRLA ENT: Oral Mucosa Clear, moist  Neck: no JVD,  Cardiovascular: S1 and S2 Present, no Murmur,  Respiratory: good respiratory effort, Bilateral Air entry equal and Decreased, no Crackles, no wheezes Abdomen: Bowel Sound present, Soft and no tenderness,  Skin: no rashes Extremities: no Pedal edema, no calf tenderness Neurologic: without any new focal findings Gait not checked due to patient safety concerns  Vitals:   10/08/20 1213 10/09/20 1934 10/10/20 0012 10/10/20 0812  BP: 138/60 (!) 156/66 102/70 (!) 153/65  Pulse: 60 66 62 79  Resp: 18  18 18   Temp:  (!) 97.4 F (36.3 C) 97.6 F (36.4 C) 98.4 F (36.9 C)  TempSrc: Oral Oral Oral   SpO2: 99% 97% 99% 94%  Weight:        Intake/Output Summary (Last 24 hours) at 10/10/2020 1316 Last data filed at 10/10/2020 0400 Gross per 24 hour  Intake 180 ml  Output 500 ml  Net -320 ml   Filed Weights   10/04/20 1640  Weight: 36.7 kg    Data Reviewed: I have personally reviewed and interpreted daily labs, tele strips, imagings as discussed above. I reviewed all nursing notes, pharmacy notes, vitals, pertinent old records I have discussed plan of care as described above with RN and patient/family.  CBC: Recent Labs  Lab  10/04/20 1019 10/05/20 0709  WBC 6.4 7.7  HGB 12.4 12.3  HCT 36.1 35.2*  MCV 93.0 93.9  PLT 273 591   Basic Metabolic Panel: Recent Labs  Lab 10/05/20 0710 10/06/20 0751 10/07/20 0542 10/08/20 0531 10/10/20 0445  NA 129* 132* 128* 126* 129*  K 3.7 4.0 4.1 4.3 4.2  CL 95* 96* 92* 90* 97*  CO2 28 29 28 30 26   GLUCOSE 91 123* 111* 82 73  BUN 14 12 11 18 15   CREATININE 0.59 0.51 0.50 0.71 0.64  CALCIUM 8.9 9.1 9.2 9.2 8.5*    Studies: No results found.  Scheduled Meds: . atenolol  25 mg Oral Daily  . calcium-vitamin D  1 tablet Oral BID  . enoxaparin (LOVENOX) injection  30 mg Subcutaneous Q24H  . folic acid  1 mg Oral Daily  . LORazepam  1 mg Intravenous Once  . melatonin  5 mg Oral QPM  . multivitamin with minerals  1 tablet Oral Daily  . multivitamin-lutein  1 capsule Oral Daily  . omega-3 acid ethyl esters  1 g Oral Daily  . pravastatin  20 mg Oral q1800  . QUEtiapine  25 mg Oral BID  . sodium chloride  1 g Oral TID WC  . thiamine  100 mg Oral Daily   Or  . thiamine  100 mg Intravenous Daily   Continuous Infusions: . sodium chloride 50 mL/hr at 10/10/20 1017  . acyclovir 365 mg (10/10/20 1018)   PRN Meds: acetaminophen, acetaminophen, albuterol, hydrALAZINE, LORazepam **OR** LORazepam, naphazoline-glycerin, ondansetron (ZOFRAN) IV, QUEtiapine  Time spent: 35 minutes  Author: Val Riles. MD [facility] 10/10/2020 1:16 PM  To reach On-call, see care teams to locate the attending and reach out to them via [email or web address]. If 7PM-7AM, please contact night-coverage If you still have difficulty reaching the attending provider, please page the [facility] (Director on Call) for [facility] on amion for assistance.

## 2020-10-10 NOTE — Progress Notes (Signed)
Physical Therapy Treatment Patient Details Name: Angela Mason MRN: [id number] DOB: [birth year] Today's Date: 10/10/2020    History of Present Illness 87 y.o. female with medical history significant of hypertension, hyperlipidemia, COPD, hypothyroidism, cystocele, who presents with altered mental status.   Per report, and her normal baseline, patient is independent, normally alert, oriented, and can drive car. Pt was noted to be confused in the past severe days, with paranoia. She is not acting like herself.    She has poor appetite and decreased oral intake. She was diagnosed with UTI on 3/7 and started on cefuroxime.    PT Comments    Patient received in bed, daughter here from [place], present in room for session. Patient is more appropriate this day. Continues to have some paranoid thoughts, but able to attend to tasks and more cooperative. Patient is mod independent with bed mobility, transfers with min assist. She ambulated 150 feet with RW and min guard. Patient will continue to benefit from skilled PT while here to improve functional independence, safety and strength for return home.      Follow Up Recommendations  Home health PT;Supervision/Assistance - 24 hour     Equipment Recommendations  Rolling walker with 5" wheels    Recommendations for Other Services       Precautions / Restrictions Precautions Precautions: Fall Restrictions Weight Bearing Restrictions: No    Mobility  Bed Mobility Overal bed mobility: Modified Independent Bed Mobility: Supine to Sit;Sit to Supine     Supine to sit: Modified independent (Device/Increase time);HOB elevated Sit to supine: Modified independent (Device/Increase time);HOB elevated        Transfers Overall transfer level: Needs assistance Equipment used: Rolling walker (2 wheeled) Transfers: Sit to/from Stand Sit to Stand: Min assist         General transfer comment: Min assist for initial standing balance. Able to power  up mostly independent  Ambulation/Gait Ambulation/Gait assistance: Min guard Gait Distance (Feet): 150 Feet Assistive device: Rolling walker (2 wheeled) Gait Pattern/deviations: Step-through pattern;Decreased step length - right;Decreased step length - left;Narrow base of support Gait velocity: decreased   General Gait Details: patient ambulating well with RW. Improved balance and cadence with increased distance. Fatigued.   Stairs             Wheelchair Mobility    Modified Rankin (Stroke Patients Only)       Balance Overall balance assessment: Needs assistance Sitting-balance support: Feet supported Sitting balance-Leahy Scale: Good Sitting balance - Comments: able to reach down to assist with donning/doffing shoes. Supervision   Standing balance support: Bilateral upper extremity supported;During functional activity Standing balance-Leahy Scale: Fair Standing balance comment: initially unsteady. Improved with increased distance.                            Cognition Arousal/Alertness: Awake/alert Behavior During Therapy: WFL for tasks assessed/performed Overall Cognitive Status: Within Functional Limits for tasks assessed                                 General Comments: Patient continues to have some paranoia, but is more personable and able to attend to tasks.      Exercises      General Comments        Pertinent Vitals/Pain Pain Assessment: No/denies pain    Home Living  Prior Function            PT Goals (current goals can now be found in the care plan section) Acute Rehab PT Goals Patient Stated Goal: to go home PT Goal Formulation: With patient Time For Goal Achievement: 10/19/20 Potential to Achieve Goals: Fair Progress towards PT goals: Progressing toward goals    Frequency    Min 2X/week      PT Plan Current plan remains appropriate    Co-evaluation               AM-PAC PT "6 Clicks" Mobility   Outcome Measure  Help needed turning from your back to your side while in a flat bed without using bedrails?: None Help needed moving from lying on your back to sitting on the side of a flat bed without using bedrails?: None Help needed moving to and from a bed to a chair (including a wheelchair)?: A Little Help needed standing up from a chair using your arms (e.g., wheelchair or bedside chair)?: A Little Help needed to walk in hospital room?: A Little Help needed climbing 3-5 steps with a railing? : A Lot 6 Click Score: 19    End of Session Equipment Utilized During Treatment: Gait belt Activity Tolerance: Patient limited by fatigue Patient left: in bed;with call bell/phone within reach;with bed alarm set;with family/visitor present Nurse Communication: Mobility status PT Visit Diagnosis: Unsteadiness on feet (R26.81);Muscle weakness (generalized) (M62.81)     Time: 9518-8416 PT Time Calculation (min) (ACUTE ONLY): 26 min  Charges:  $Gait Training: 8-22 mins $Therapeutic Activity: 8-22 mins                     Kristyn Conetta, PT, GCS 10/10/20,3:10 PM

## 2020-10-11 DIAGNOSIS — R4182 Altered mental status, unspecified: Secondary | ICD-10-CM

## 2020-10-11 DIAGNOSIS — G9341 Metabolic encephalopathy: Secondary | ICD-10-CM | POA: Diagnosis not present

## 2020-10-11 DIAGNOSIS — I10 Essential (primary) hypertension: Secondary | ICD-10-CM | POA: Diagnosis not present

## 2020-10-11 LAB — BASIC METABOLIC PANEL
Anion gap: 5 (ref 5–15)
BUN: 27 mg/dL — ABNORMAL HIGH (ref 8–23)
CO2: 26 mmol/L (ref 22–32)
Calcium: 8.6 mg/dL — ABNORMAL LOW (ref 8.9–10.3)
Chloride: 102 mmol/L (ref 98–111)
Creatinine, Ser: 1 mg/dL (ref 0.44–1.00)
GFR, Estimated: 55 mL/min — ABNORMAL LOW (ref >60)
Glucose, Bld: 88 mg/dL (ref 70–99)
Potassium: 4.1 mmol/L (ref 3.5–5.1)
Sodium: 133 mmol/L — ABNORMAL LOW (ref 135–145)

## 2020-10-11 LAB — CBC
HCT: 34.1 % — ABNORMAL LOW (ref 36.0–46.0)
Hemoglobin: 11.6 g/dL — ABNORMAL LOW (ref 12.0–15.0)
MCH: 32.3 pg (ref 26.0–34.0)
MCHC: 34 g/dL (ref 30.0–36.0)
MCV: 95 fL (ref 80.0–100.0)
Platelets: 239 10*3/uL (ref 150–400)
RBC: 3.59 MIL/uL — ABNORMAL LOW (ref 3.87–5.11)
RDW: 12.1 % (ref 11.5–15.5)
WBC: 8.4 10*3/uL (ref 4.0–10.5)
nRBC: 0 % (ref 0.0–0.2)

## 2020-10-11 LAB — PHOSPHORUS: Phosphorus: 3.9 mg/dL (ref 2.5–4.6)

## 2020-10-11 LAB — MAGNESIUM: Magnesium: 2 mg/dL (ref 1.7–2.4)

## 2020-10-11 MED ORDER — VITAMIN D (ERGOCALCIFEROL) 1.25 MG (50000 UNIT) PO CAPS
50000.0000 [IU] | ORAL_CAPSULE | ORAL | Status: DC
  Administered 2020-10-11: 50000 [IU] via ORAL
  Filled 2020-10-11: qty 1

## 2020-10-11 MED ORDER — DEXTROSE 5 % IV SOLN
10.0000 mg/kg | INTRAVENOUS | Status: DC
  Filled 2020-10-11: qty 7.3

## 2020-10-11 NOTE — Plan of Care (Addendum)
Patient is calm and cooperative this shift. Oriented to person and time only. V/S stable. Ativan given pre-MRI. Pt slept all night.

## 2020-10-11 NOTE — Progress Notes (Signed)
eeg done °

## 2020-10-11 NOTE — Progress Notes (Signed)
[facility] Progress Note  Patient: Angela Mason    VWU:[id number]  DOA: 10/04/2020     Date of Service: the patient was seen and examined on 10/11/2020  Chief Complaint  Patient presents with  . Altered Mental Status  . UTI   Brief hospital course: Angela Mason is an 87 y.o. female with medical history significant ofhypertension, hyperlipidemia, COPD, hypothyroidism, cystocele, who presents with altered mental status. Per report, and her normal baseline, patient is independent,normally alert, oriented, and candrive car. Pt was noted to be confused in the past severe days, withparanoia. She is not acting like herself.She has poor appetite and decreased oral intake. She was diagnosed with UTI on 3/7 and started on cefuroxime. Not responding to current treatment.  Await MRI -- will have to have daughter her (flying from [place]).  Trial of IV ativan to see if this calms patient.  Chart review also shows alcohol use-- unclear amounts so CIWA started.   Assessment and Plan: Principal Problem:   Acute metabolic encephalopathy Active Problems:   HLD (hyperlipidemia)   HTN (hypertension)   Hypothyroidism   Hyponatremia   Hypertensive urgency   UTI (urinary tract infection)   AMS (altered mental status)   Acute metabolic encephalopathy:Likely multifactorial etiology, including hypertensive urgency, hyponatremia, UTI, possible PRES syndrome CT head negative for acute intracranial abnormalities. No focal neuro deficit on physical examination. -continues to be oriented to person/place/time but more paranoid and hostile/mis-trusting -Treat underlying issues as below -? PRES as BP was high when patient arrived in the hospital- MRI Pending but patient has not been cooperative -reports that daughter is on her way from [place] so will see if they can help convince patient  -trial of IV ativan -neurology consult appreciated- no fever ? Of HSV-- discussed with neurology and  started IV acyclovir, MRI brain ruled out any acute events -EEG pending, patient refused EEG yesterday, we will try to get it done today as patient's daughter will help to get it done -psych consult appreciated, may need to re-consult psych for further recommendations-- sometimes refusing PO so may need IM or IV 3/16 started Seroquel 25 mg p.o. twice daily, continue Seroquel 12.5 mg p.o. every 6 hourly as needed for acute agitation   UTI: Patient was started onon cefuroxime3/7/22, urinalysis negative -treated for a total of 5 days (including outpt abx) -culture negative as treated outpatient -not impressed that this is the current cause of her issues  HLD (hyperlipidemia) -Pravastatin  HTN (hypertension)and hypertensive urgency: Blood pressure 207/92, 182/91 upon admission -continue homeAtenolol but change to daily-- HR in the 60s so not able to increase atenolol -much improved- appear to take her BP meds occasionally even in the hospital  H/o Hypothyroidism: not taking meds now - TSH normal  Hyponatremia:Na 124 upon admission  -Likely due topoor oral intake and dehydration. -Urine Na >20 and urine osmo > 100 -- suspect SIADH so will fluid restrict and trend  -has been under increased stress with husband's illness -cortisol normal -TSH normal -Na 129--133 Started regular diet deliberate salt, salt tablet 1 g p.o. 3 times daily for 2 days Monitor sodium level daily   H&P says current alcohol use -patient not willing to clarify -Continue CIWA and monitor    Body mass index is 16.6 kg/m.  Interventions:        Diet: Regular diet DVT Prophylaxis: Subcutaneous Lovenox   Advance goals of care discussion: Full code  Family Communication: family was NOTpresent at bedside, at the time of interview.  The pt provided permission to discuss medical plan with the family. Opportunity was given to ask question and all questions were answered satisfactorily.  3/16 D/w  daughter over the phone  Disposition:  Pt is from independent living facility, admitted with acute metabolic encephalopathy and delusions, still has delusions, which precludes a safe discharge. Discharge to independent living, when her mental status will improve and facility agrees to take her back.  3/17 patient agreed EEG which will be done, we will follow psych and neurology for further recommendation and disposition plan.    Subjective: No significant overnight issues, patient was sleepy, patient was given Ativan yesterday for MRI so she slept well overnight and in the morning time she was eating breakfast, denied any active issues. Patient still having delusional thoughts but she is very directable and she is not agitated    Physical Exam: General:  alert oriented to time, place, and person.  Appear in mild distress, affect anxious Eyes: PERRLA ENT: Oral Mucosa Clear, moist  Neck: no JVD,  Cardiovascular: S1 and S2 Present, no Murmur,  Respiratory: good respiratory effort, Bilateral Air entry equal and Decreased, no Crackles, no wheezes Abdomen: Bowel Sound present, Soft and no tenderness,  Skin: no rashes Extremities: no Pedal edema, no calf tenderness Neurologic: without any new focal findings Gait not checked due to patient safety concerns  Vitals:   10/10/20 2348 10/11/20 0423 10/11/20 0846 10/11/20 1136  BP: (!) 93/50 125/70 (!) 147/59 (!) 134/58  Pulse: (!) 57 (!) 59 61 68  Resp: 18 18 16 18   Temp: 98.5 F (36.9 C) 97.6 F (36.4 C) 97.9 F (36.6 C)   TempSrc: Oral Oral Oral   SpO2: 98% 99% 98%   Weight:        Intake/Output Summary (Last 24 hours) at 10/11/2020 1400 Last data filed at 10/11/2020 2025 Gross per 24 hour  Intake 2531.15 ml  Output --  Net 2531.15 ml   Filed Weights   10/04/20 1640  Weight: 36.7 kg    Data Reviewed: I have personally reviewed and interpreted daily labs, tele strips, imagings as discussed above. I reviewed all nursing notes,  pharmacy notes, vitals, pertinent old records I have discussed plan of care as described above with RN and patient/family.  CBC: Recent Labs  Lab 10/05/20 0709 10/11/20 0425  WBC 7.7 8.4  HGB 12.3 11.6*  HCT 35.2* 34.1*  MCV 93.9 95.0  PLT 282 427   Basic Metabolic Panel: Recent Labs  Lab 10/06/20 0751 10/07/20 0542 10/08/20 0531 10/10/20 0445 10/11/20 0425  NA 132* 128* 126* 129* 133*  K 4.0 4.1 4.3 4.2 4.1  CL 96* 92* 90* 97* 102  CO2 29 28 30 26 26   GLUCOSE 123* 111* 82 73 88  BUN 12 11 18 15  27*  CREATININE 0.51 0.50 0.71 0.64 1.00  CALCIUM 9.1 9.2 9.2 8.5* 8.6*  MG  --   --   --   --  2.0  PHOS  --   --   --   --  3.9    Studies: MR BRAIN W WO CONTRAST  Result Date: 10/11/2020 CLINICAL DATA:  Initial evaluation for mental status change, persistent or worsening. EXAM: MRI HEAD WITHOUT AND WITH CONTRAST TECHNIQUE: Multiplanar, multiecho pulse sequences of the brain and surrounding structures were obtained without and with intravenous contrast. CONTRAST:  82mL GADAVIST GADOBUTROL 1 MMOL/ML IV SOLN COMPARISON:  Prior head CT from 10/04/2020. FINDINGS: Brain: Examination degraded by motion artifact. Diffuse prominence of the  CSF containing spaces compatible generalized age-related cerebral atrophy. Patchy T2/FLAIR hyperintensity seen within the periventricular and deep white matter both cerebral hemispheres as well as the pons, most consistent with chronic microvascular ischemic disease, mild for age. Small remote lacunar infarct present at the posterior aspect of the right lentiform nucleus. No abnormal foci of restricted diffusion to suggest acute or subacute ischemia. Gray-white matter differentiation otherwise maintained. No encephalomalacia to suggest chronic cortical infarction elsewhere within the brain. No evidence for acute or chronic intracranial hemorrhage. No mass lesion, midline shift or mass effect. Mild ventricular prominence related to global parenchymal volume  loss without hydrocephalus. No extra-axial fluid collection. Pituitary gland suprasellar region within normal limits. Midline structures intact. No abnormal enhancement. Vascular: Major intracranial vascular flow voids are maintained. Skull and upper cervical spine: Craniocervical junction within normal limits. No focal marrow replacing lesion. No scalp soft tissue abnormality. Sinuses/Orbits: Globes and orbital soft tissues demonstrate no acute finding. Patient status post bilateral ocular lens replacement. Paranasal sinuses are largely clear. No significant mastoid effusion. Inner ear structures grossly normal. Other: None. IMPRESSION: 1. No acute intracranial abnormality. 2. Generalized age-related cerebral atrophy with mild chronic small vessel ischemic disease. 3. Small remote lacunar at the posterior right lentiform nucleus. Electronically Signed   By: Jeannine Boga M.D.   On: 10/11/2020 02:34    Scheduled Meds: . atenolol  25 mg Oral Daily  . calcium-vitamin D  1 tablet Oral BID  . enoxaparin (LOVENOX) injection  30 mg Subcutaneous Q24H  . folic acid  1 mg Oral Daily  . melatonin  5 mg Oral QPM  . multivitamin with minerals  1 tablet Oral Daily  . multivitamin-lutein  1 capsule Oral Daily  . omega-3 acid ethyl esters  1 g Oral Daily  . pravastatin  20 mg Oral q1800  . QUEtiapine  25 mg Oral BID  . sodium chloride  1 g Oral TID WC  . thiamine  100 mg Oral Daily   Or  . thiamine  100 mg Intravenous Daily  . Vitamin D (Ergocalciferol)  50,000 Units Oral Q7 days   Continuous Infusions: . sodium chloride 50 mL/hr at 10/11/20 1250  . acyclovir     PRN Meds: acetaminophen, acetaminophen, albuterol, hydrALAZINE, LORazepam **OR** LORazepam, naphazoline-glycerin, ondansetron (ZOFRAN) IV, QUEtiapine  Time spent: 35 minutes  Author: Val Riles. MD [facility] 10/11/2020 2:00 PM  To reach On-call, see care teams to locate the attending and reach out to them via  [email or web address]. If 7PM-7AM, please contact night-coverage If you still have difficulty reaching the attending provider, please page the [facility] (Director on Call) for [facility] on amion for assistance.

## 2020-10-11 NOTE — Procedures (Signed)
Patient Name: Ramonia Mcclaran  MRN: [id number]  Epilepsy Attending: Lora Havens  Referring Physician/Provider: Dr Val Riles Date: 10/11/2020 Duration: 24.52 mins  Patient history: 87 yo F with ams. EEG to evaluate for seizure  Level of alertness: Awake, asleep  AEDs during EEG study: None  Technical aspects: This EEG study was done with scalp electrodes positioned according to the 10-20 International system of electrode placement. Electrical activity was acquired at a sampling rate of 500Hz  and reviewed with a high frequency filter of 70Hz  and a low frequency filter of 1Hz . EEG data were recorded continuously and digitally stored.   Description: The posterior dominant rhythm consists of 9 Hz activity of moderate voltage (25-35 uV) seen predominantly in posterior head regions, symmetric and reactive to eye opening and eye closing. Sleep was characterized by vertex waves, sleep spindles (12 to 14 Hz), maximal frontocentral region. EEG showed intermittent generalized 3 to 6 Hz theta-delta slowing. Physiologic photic driving was not seen during photic stimulation.  Hyperventilation was not performed.     ABNORMALITY - Intermittent slow, generalized  IMPRESSION: This study is suggestive of mild diffuse encephalopathy, nonspecific etiology.  No seizures or epileptiform discharges were seen throughout the recording.  Jayla Mackie Barbra Sarks

## 2020-10-11 NOTE — Progress Notes (Signed)
PHARMACY NOTE:  ANTIMICROBIAL RENAL DOSAGE ADJUSTMENT  Current antimicrobial regimen includes a mismatch between antimicrobial dosage and estimated renal function.  As per policy approved by the Pharmacy & Therapeutics and Medical Executive Committees, the antimicrobial dosage will be adjusted accordingly.  Current antimicrobial dosage:  Acyclovir 10 mg/kg q12h  Indication: HSV encephalopathy  Renal Function:  Estimated Creatinine Clearance: 23.8 mL/min (by C-G formula based on SCr of 1 mg/dL).     Antimicrobial dosage has been changed to:  Acyclovir 10 mg/kg q24h    Thank you for allowing pharmacy to be a part of this patient's care.  Dorena Bodo, PharmD 10/11/2020 8:32 AM

## 2020-10-11 NOTE — TOC Progression Note (Signed)
Transition of Care [facility]) - Progression Note    Patient Details  Name: Angela Mason MRN: [id number] Date of Birth: Nov 20, 1932  [facility] of [facility]) CM/SW Contact  Beverly Sessions, RN Phone Number: 10/11/2020, 3:04 PM  Clinical Narrative:     Patient for EEG today  Mental status still not as baseline    Expected Discharge Plan: [place] Barriers to Discharge: Continued Medical Work up  Expected Discharge Plan and Services Expected Discharge Plan: [place] In-house Referral: Clinical Social Work     Living arrangements for the past 2 months: [place]: PT,OT [place]: [place] ([facility]) Date [place]: 10/07/20 Time [place]: 1706 Representative spoke with at [place]: [place] ([place]) Interventions    Readmission Risk Interventions No flowsheet data found.

## 2020-10-12 DIAGNOSIS — F323 Major depressive disorder, single episode, severe with psychotic features: Secondary | ICD-10-CM | POA: Diagnosis not present

## 2020-10-12 DIAGNOSIS — I10 Essential (primary) hypertension: Secondary | ICD-10-CM | POA: Diagnosis not present

## 2020-10-12 HISTORY — DX: Major depressive disorder, single episode, severe with psychotic features: F32.3

## 2020-10-12 LAB — CBC
HCT: 32.9 % — ABNORMAL LOW (ref 36.0–46.0)
Hemoglobin: 10.9 g/dL — ABNORMAL LOW (ref 12.0–15.0)
MCH: 32 pg (ref 26.0–34.0)
MCHC: 33.1 g/dL (ref 30.0–36.0)
MCV: 96.5 fL (ref 80.0–100.0)
Platelets: 225 10*3/uL (ref 150–400)
RBC: 3.41 MIL/uL — ABNORMAL LOW (ref 3.87–5.11)
RDW: 12.4 % (ref 11.5–15.5)
WBC: 6.6 10*3/uL (ref 4.0–10.5)
nRBC: 0 % (ref 0.0–0.2)

## 2020-10-12 LAB — GLUCOSE, CAPILLARY: Glucose-Capillary: 105 mg/dL — ABNORMAL HIGH (ref 70–99)

## 2020-10-12 LAB — BASIC METABOLIC PANEL
Anion gap: 7 (ref 5–15)
BUN: 21 mg/dL (ref 8–23)
CO2: 26 mmol/L (ref 22–32)
Calcium: 9.1 mg/dL (ref 8.9–10.3)
Chloride: 104 mmol/L (ref 98–111)
Creatinine, Ser: 0.71 mg/dL (ref 0.44–1.00)
GFR, Estimated: >60 mL/min (ref >60)
Glucose, Bld: 83 mg/dL (ref 70–99)
Potassium: 4 mmol/L (ref 3.5–5.1)
Sodium: 137 mmol/L (ref 135–145)

## 2020-10-12 LAB — MAGNESIUM: Magnesium: 2 mg/dL (ref 1.7–2.4)

## 2020-10-12 LAB — PHOSPHORUS: Phosphorus: 4.2 mg/dL (ref 2.5–4.6)

## 2020-10-12 MED ORDER — QUETIAPINE FUMARATE 25 MG PO TABS
25.0000 mg | ORAL_TABLET | Freq: Every evening | ORAL | Status: DC
  Filled 2020-10-12: qty 1

## 2020-10-12 MED ORDER — MIRTAZAPINE 15 MG PO TBDP
15.0000 mg | ORAL_TABLET | Freq: Every day | ORAL | Status: DC
  Administered 2020-10-12 – 2020-10-14 (×3): 15 mg via ORAL
  Filled 2020-10-12 (×4): qty 1

## 2020-10-12 MED ORDER — ARIPIPRAZOLE 2 MG PO TABS
1.0000 mg | ORAL_TABLET | Freq: Every day | ORAL | Status: DC
  Administered 2020-10-12 – 2020-10-14 (×3): 1 mg via ORAL
  Filled 2020-10-12 (×4): qty 1

## 2020-10-12 MED ORDER — QUETIAPINE FUMARATE 25 MG PO TABS: 25.0000 mg | ORAL_TABLET | Freq: Every evening | ORAL | Status: DC

## 2020-10-12 MED ORDER — QUETIAPINE FUMARATE 25 MG PO TABS
12.5000 mg | ORAL_TABLET | Freq: Two times a day (BID) | ORAL | Status: DC
  Administered 2020-10-12: 12.5 mg via ORAL

## 2020-10-12 NOTE — Progress Notes (Signed)
PT Cancellation Note  Patient Details Name: Angela Mason MRN: [id number] DOB: [birth year]   Cancelled Treatment:     Therapist in to see pt this pm. Pt resting in bed, adamantly refused any activity stating she was unable to tolerate anything today or ever again.  Will try to return if time permits. Continue per POC.   Josie Dixon 10/12/2020, 2:28 PM

## 2020-10-12 NOTE — Progress Notes (Signed)
[facility] Progress Note  Patient: Angela Mason    XBM:[id number]  DOA: 10/04/2020     Date of Service: the patient was seen and examined on 10/12/2020  Chief Complaint  Patient presents with  . Altered Mental Status  . UTI   Brief hospital course: Cassady Stanczak is an 87 y.o. female with medical history significant ofhypertension, hyperlipidemia, COPD, hypothyroidism, cystocele, who presents with altered mental status. Per report, and her normal baseline, patient is independent,normally alert, oriented, and candrive car. Pt was noted to be confused in the past severe days, withparanoia. She is not acting like herself.She has poor appetite and decreased oral intake. She was diagnosed with UTI on 3/7 and started on cefuroxime. Not responding to current treatment.  Await MRI -- will have to have daughter her (flying from [place]).  Trial of IV ativan to see if this calms patient.  Chart review also shows alcohol use-- unclear amounts so CIWA started.   Assessment and Plan: Principal Problem:   Acute metabolic encephalopathy Active Problems:   HLD (hyperlipidemia)   HTN (hypertension)   Hypothyroidism   Hyponatremia   Hypertensive urgency   UTI (urinary tract infection)   AMS (altered mental status)   Acute metabolic encephalopathy:Likely multifactorial etiology, including hypertensive urgency, hyponatremia, UTI, possible PRES syndrome CT head negative for acute intracranial abnormalities. No focal neuro deficit on physical examination. -continues to be oriented to person/place/time but more paranoid and hostile/mis-trusting -Treat underlying issues as below -? PRES as BP was high when patient arrived in the hospital- MRI Pending but patient has not been cooperative -reports that daughter is on her way from [place] so will see if they can help convince patient  -trial of IV ativan -neurology consult appreciated- no fever, s/p IV acyclovir due to HSV suspicion  which was discontinued as per neurology, no need of LP. MRI brain ruled out any acute events. EEG  mild diffuse encephalopathy, no seizures or epileptiform discharges. -psych consult appreciated, may need to re-consult psych for further recommendations-- sometimes refusing PO so may need IM or IV 3/16 started Seroquel 25 mg p.o. twice daily, continue Seroquel 12.5 mg p.o. every 6 hourly as needed for acute agitation 3/18 decreased Seroquel 25 mg nightly as patient was sleepy in the daytime. Psych reconsulted for further recommendation and disposition plan  UTI: Patient was started onon cefuroxime3/7/22, urinalysis negative -treated for a total of 5 days (including outpt abx) -culture negative as treated outpatient -not impressed that this is the current cause of her issues  HLD (hyperlipidemia) -Pravastatin  HTN (hypertension)and hypertensive urgency: Blood pressure 207/92, 182/91 upon admission -continue homeAtenolol but change to daily-- HR in the 60s so not able to increase atenolol -much improved- appear to take her BP meds occasionally even in the hospital  H/o Hypothyroidism: not taking meds now - TSH normal  Hyponatremia:Na 124 upon admission  -Likely due topoor oral intake and dehydration. -Urine Na >20 and urine osmo > 100 -- suspect SIADH so will fluid restrict and trend  -has been under increased stress with husband's illness -cortisol normal -TSH normal -Na 129--133--137 Started regular diet deliberate salt,  s/p salt tablet 1 g p.o. 3 times daily for 2 days Monitor sodium level daily   H&P says current alcohol use -patient not willing to clarify -Continue CIWA and monitor    Body mass index is 16.6 kg/m.  Interventions:     Diet: Regular diet DVT Prophylaxis: Subcutaneous Lovenox   Advance goals of care discussion: Full code  Family Communication: family was present at bedside, at the time of interview.  The pt provided permission to  discuss medical plan with the family. Opportunity was given to ask question and all questions were answered satisfactorily.    Disposition:  Pt is from independent living facility, admitted with acute metabolic encephalopathy and delusions, still has delusions, which precludes a safe discharge. Discharge to independent living, when her mental status will improve and facility agrees to take her back.  Waiting for psych recommendation, consulted again on 3/18   Subjective: No significant overnight issues, patient slept well last night, patient still has delusions when patient's daughter is not available in the room and she always thinks that something is going to happen to her or her daughter.  Patient was sleepy in the daytime as per patient daughter so dose of Seroquel decreased to nightly only.  Patient's daughter wanted to talk to psychiatrist which I texted to consider again   Physical Exam: General:  alert oriented to time, place, and person.  Appear in mild distress, affect anxious Eyes: PERRLA ENT: Oral Mucosa Clear, moist  Neck: no JVD,  Cardiovascular: S1 and S2 Present, no Murmur,  Respiratory: good respiratory effort, Bilateral Air entry equal and Decreased, no Crackles, no wheezes Abdomen: Bowel Sound present, Soft and no tenderness,  Skin: no rashes Extremities: no Pedal edema, no calf tenderness Neurologic: without any new focal findings Gait not checked due to patient safety concerns  Vitals:   26-Oct-2020 2335 10/12/20 0340 10/12/20 0810 10/12/20 1037  BP: (!) 92/47 117/60 (!) 164/76 (!) 157/63  Pulse: 60 60 63 65  Resp: 20 18 16 20   Temp: 98.4 F (36.9 C) 98 F (36.7 C) 97.6 F (36.4 C) 98.2 F (36.8 C)  TempSrc: Oral Oral Oral   SpO2: 96% 99% 97% 99%  Weight:        Intake/Output Summary (Last 24 hours) at 10/12/2020 1503 Last data filed at 10/12/2020 1423 Gross per 24 hour  Intake 770.13 ml  Output 500 ml  Net 270.13 ml   Filed Weights   10/04/20 1640   Weight: 36.7 kg    Data Reviewed: I have personally reviewed and interpreted daily labs, tele strips, imagings as discussed above. I reviewed all nursing notes, pharmacy notes, vitals, pertinent old records I have discussed plan of care as described above with RN and patient/family.  CBC: Recent Labs  Lab 10-26-20 0425 10/12/20 0425  WBC 8.4 6.6  HGB 11.6* 10.9*  HCT 34.1* 32.9*  MCV 95.0 96.5  PLT 239 268   Basic Metabolic Panel: Recent Labs  Lab 10/07/20 0542 10/08/20 0531 10/10/20 0445 10-26-20 0425 10/12/20 0425  NA 128* 126* 129* 133* 137  K 4.1 4.3 4.2 4.1 4.0  CL 92* 90* 97* 102 104  CO2 28 30 26 26 26   GLUCOSE 111* 82 73 88 83  BUN 11 18 15  27* 21  CREATININE 0.50 0.71 0.64 1.00 0.71  CALCIUM 9.2 9.2 8.5* 8.6* 9.1  MG  --   --   --  2.0 2.0  PHOS  --   --   --  3.9 4.2    Studies: EEG adult  Result Date: October 26, 2020 Lora Havens, MD     10-26-20  4:56 PM Patient Name: Danely Bayliss MRN: [id number] Epilepsy Attending: Lora Havens Referring Physician/Provider: Dr Val Riles Date: October 26, 2020 Duration: 24.52 mins Patient history: 87 yo F with ams. EEG to evaluate for seizure Level of alertness: Awake, asleep AEDs during  EEG study: None Technical aspects: This EEG study was done with scalp electrodes positioned according to the 10-20 International system of electrode placement. Electrical activity was acquired at a sampling rate of 500Hz  and reviewed with a high frequency filter of 70Hz  and a low frequency filter of 1Hz . EEG data were recorded continuously and digitally stored. Description: The posterior dominant rhythm consists of 9 Hz activity of moderate voltage (25-35 uV) seen predominantly in posterior head regions, symmetric and reactive to eye opening and eye closing. Sleep was characterized by vertex waves, sleep spindles (12 to 14 Hz), maximal frontocentral region. EEG showed intermittent generalized 3 to 6 Hz theta-delta slowing. Physiologic photic  driving was not seen during photic stimulation.  Hyperventilation was not performed.   ABNORMALITY - Intermittent slow, generalized IMPRESSION: This study is suggestive of mild diffuse encephalopathy, nonspecific etiology. No seizures or epileptiform discharges were seen throughout the recording. Priyanka Barbra Sarks    Scheduled Meds: . atenolol  25 mg Oral Daily  . calcium-vitamin D  1 tablet Oral BID  . enoxaparin (LOVENOX) injection  30 mg Subcutaneous Q24H  . folic acid  1 mg Oral Daily  . melatonin  5 mg Oral QPM  . multivitamin with minerals  1 tablet Oral Daily  . multivitamin-lutein  1 capsule Oral Daily  . omega-3 acid ethyl esters  1 g Oral Daily  . pravastatin  20 mg Oral q1800  . QUEtiapine  12.5 mg Oral BID  . QUEtiapine  25 mg Oral QPM  . sodium chloride  1 g Oral TID WC  . thiamine  100 mg Oral Daily   Or  . thiamine  100 mg Intravenous Daily  . Vitamin D (Ergocalciferol)  50,000 Units Oral Q7 days   Continuous Infusions: . sodium chloride 50 mL/hr at 10/12/20 0236   PRN Meds: acetaminophen, acetaminophen, albuterol, hydrALAZINE, naphazoline-glycerin, ondansetron (ZOFRAN) IV  Time spent: 35 minutes  Author: Val Riles. MD [facility] 10/12/2020 3:03 PM  To reach On-call, see care teams to locate the attending and reach out to them via [email or web address]. If 7PM-7AM, please contact night-coverage If you still have difficulty reaching the attending provider, please page the [facility] (Director on Call) for [facility] on amion for assistance.

## 2020-10-12 NOTE — Consult Note (Signed)
[facility] Face-to-Face Psychiatry Consult   Reason for Consult: Consult to repeat evaluation of this 87 year old woman in the hospital with multiple medical problems and changes in mental status Referring Physician: Dwyane Dee Patient Identification: Angela Mason MRN:  [id number] Principal Diagnosis: Severe major depression, single episode, with psychotic features ([place]) Diagnosis:  Principal Problem:   Severe major depression, single episode, with psychotic features ([place]) Active Problems:   Acute metabolic encephalopathy   HLD (hyperlipidemia)   HTN (hypertension)   Hypothyroidism   Hyponatremia   Hypertensive urgency   UTI (urinary tract infection)   AMS (altered mental status)   Total Time spent with patient: 1 hour  Subjective:   Angela Mason is a 87 y.o. female patient admitted with "I am worried about my family's safety".  HPI: Patient seen chart reviewed.  Patient's daughter was also in the room and was helpful with clarifying and adding history.  87 year old woman who is been in the hospital for several days now initially with altered mental status.  Now seems to have improved in her clarity of thinking to the point of being able to give history.  Patient reports that everything went terribly downhill the night that her husband had a fall at home and had to go back into rehab or into the hospital.  Daughter confirms this is somewhat accurate.  Patient had been under a great deal of stress for the past year taking care of her husband.  He had a spell in the hospital and rehab and was sent home back in January only to have a fall within 24 hours.  This seems to of finally tipped the patient over the edge.  She has lost a great deal of weight in the last few months.  Become extremely anxious and seems to be disorganized in her thinking.  In conversation she talks a great deal about being concerned for the safety of her daughter and her husband despite there being no evident reason to worry  about safety especially not for her daughter.  The more we talked the more paranoia is revealed as she is clearly paranoid about the nurses chatter that she can hear outside the window.  She seems to have an ongoing fixation with a educational posterior on the wall believing that it somehow represents something malevolent towards her family.  She is very tired but does not seem to be sleeping well at night.  Patient denies suicidal ideation.  She is able to engage in conversation and has a little bit of insight especially into her anxiety but still sticks with her paranoia.  She had been receiving some Seroquel in the hospital for concerns about agitation and delirium but that has been tapered down as it seemed to be a little too sedating.  Prior to this patient had been receiving no treatment for any mental health issues.  Past Psychiatric History: No prior psychiatric history of treatment or evaluation at all.  Daughter reports that the patient has always been a bit of a worrying and nervous person but had never had any known mental health treatment.  Risk to Self:   Risk to Others:   Prior Inpatient Therapy:   Prior Outpatient Therapy:    Past Medical History:  Past Medical History:  Diagnosis Date  . Cancer ([place])    SKIN  . COPD, mild ([place])   . Dyspnea   . Edema   . HLD (hyperlipidemia)   . HTN (hypertension)   . Hypothyroidism   . Macular degeneration,  bilateral   . Varicosities     Past Surgical History:  Procedure Laterality Date  . CATARACT EXTRACTION W/PHACO Right 02/03/2017   Procedure: CATARACT EXTRACTION PHACO AND INTRAOCULAR LENS PLACEMENT (IOC);  Surgeon: Birder Robson, MD;  Location: [facility] ORS;  Service: Ophthalmology;  Laterality: Right;  [place] 00:57.2 AP% 18.5 CDE 10.58 Fluid pack lot # [id number]  . CATARACT EXTRACTION W/PHACO Left 02/24/2017   Procedure: CATARACT EXTRACTION PHACO AND INTRAOCULAR LENS PLACEMENT (IOC);  Surgeon: Birder Robson, MD;  Location: [facility] ORS;   Service: Ophthalmology;  Laterality: Left;  [place] 01:12 AP% 25.0 CDE 18.03 Fluid pack lot # [id number]  . MOHS SURGERY  2015   nose  . SALIVARY GLAND SURGERY Left 2012  . TONSILLECTOMY  1940   Family History:  Family History  Problem Relation Age of Onset  . COPD Father   . Heart failure Father   . Diverticulitis Mother   . Macular degeneration Mother   . Epilepsy Sister   . Parkinson's disease Brother   . Cancer Neg Hx   . Diabetes Neg Hx   . Breast cancer Neg Hx    Family Psychiatric  History: None reported Social History:  Social History   Substance and Sexual Activity  Alcohol Use Yes  . Alcohol/week: 0.0 standard drinks   Comment: 1 qd     Social History   Substance and Sexual Activity  Drug Use No    Social History   Socioeconomic History  . Marital status: Married    Spouse name: Not on file  . Number of children: Not on file  . Years of education: Not on file  . Highest education level: Not on file  Occupational History  . Not on file  Tobacco Use  . Smoking status: Former Smoker    Quit date: 09/05/2005    Years since quitting: 15.1  . Smokeless tobacco: Never Used  Vaping Use  . Vaping Use: Never used  Substance and Sexual Activity  . Alcohol use: Yes    Alcohol/week: 0.0 standard drinks    Comment: 1 qd  . Drug use: No  . Sexual activity: Not Currently    Birth control/protection: Post-menopausal  Other Topics Concern  . Not on file  Social History Narrative  . Not on file   Social Determinants of Health   Financial Resource Strain: Not on file  Food Insecurity: Not on file  Transportation Needs: Not on file  Physical Activity: Not on file  Stress: Not on file  Social Connections: Not on file   Additional Social History:    Allergies:   Allergies  Allergen Reactions  . Gentamicin   . Levofloxacin Diarrhea    Labs:  Results for orders placed or performed during the hospital encounter of 10/04/20 (from the past 48 hour(s))  Basic  metabolic panel     Status: Abnormal   Collection Time: 10/11/20  4:25 AM  Result Value Ref Range   Sodium 133 (L) 135 - 145 mmol/L   Potassium 4.1 3.5 - 5.1 mmol/L   Chloride 102 98 - 111 mmol/L   CO2 26 22 - 32 mmol/L   Glucose, Bld 88 70 - 99 mg/dL    Comment: Glucose reference range applies only to samples taken after fasting for at least 8 hours.   BUN 27 (H) 8 - 23 mg/dL   Creatinine, Ser 1.00 0.44 - 1.00 mg/dL   Calcium 8.6 (L) 8.9 - 10.3 mg/dL   GFR, Estimated 55 (L) >60 mL/min  Comment: (NOTE) Calculated using the CKD-EPI Creatinine Equation (2021)    Anion gap 5 5 - 15    Comment: Performed at [facility], [address] [place], [place] [address]  CBC     Status: Abnormal   Collection Time: 10/11/20  4:25 AM  Result Value Ref Range   WBC 8.4 4.0 - 10.5 K/uL   RBC 3.59 (L) 3.87 - 5.11 MIL/uL   Hemoglobin 11.6 (L) 12.0 - 15.0 g/dL   HCT 34.1 (L) 36.0 - 46.0 %   MCV 95.0 80.0 - 100.0 fL   MCH 32.3 26.0 - 34.0 pg   MCHC 34.0 30.0 - 36.0 g/dL   RDW 12.1 11.5 - 15.5 %   Platelets 239 150 - 400 K/uL   nRBC 0.0 0.0 - 0.2 %    Comment: Performed at [facility], [address] [place], [place] [address]  Magnesium     Status: None   Collection Time: 10/11/20  4:25 AM  Result Value Ref Range   Magnesium 2.0 1.7 - 2.4 mg/dL    Comment: Performed at [facility], [address] [place], [place] [address]  Phosphorus     Status: None   Collection Time: 10/11/20  4:25 AM  Result Value Ref Range   Phosphorus 3.9 2.5 - 4.6 mg/dL    Comment: Performed at [facility], [address] [place], [place] [address]  Basic metabolic panel     Status: None   Collection Time: 10/12/20  4:25 AM  Result Value Ref Range   Sodium 137 135 - 145 mmol/L   Potassium 4.0 3.5 - 5.1 mmol/L   Chloride 104 98 - 111 mmol/L   CO2 26 22 - 32 mmol/L   Glucose, Bld 83 70 - 99 mg/dL    Comment: Glucose reference range applies only to samples taken after  fasting for at least 8 hours.   BUN 21 8 - 23 mg/dL   Creatinine, Ser 0.71 0.44 - 1.00 mg/dL   Calcium 9.1 8.9 - 10.3 mg/dL   GFR, Estimated >60 >60 mL/min    Comment: (NOTE) Calculated using the CKD-EPI Creatinine Equation (2021)    Anion gap 7 5 - 15    Comment: Performed at [facility], [address] [place], [place] [address]  CBC     Status: Abnormal   Collection Time: 10/12/20  4:25 AM  Result Value Ref Range   WBC 6.6 4.0 - 10.5 K/uL   RBC 3.41 (L) 3.87 - 5.11 MIL/uL   Hemoglobin 10.9 (L) 12.0 - 15.0 g/dL   HCT 32.9 (L) 36.0 - 46.0 %   MCV 96.5 80.0 - 100.0 fL   MCH 32.0 26.0 - 34.0 pg   MCHC 33.1 30.0 - 36.0 g/dL   RDW 12.4 11.5 - 15.5 %   Platelets 225 150 - 400 K/uL   nRBC 0.0 0.0 - 0.2 %    Comment: Performed at [facility], [address] [place], [place] [address]  Magnesium     Status: None   Collection Time: 10/12/20  4:25 AM  Result Value Ref Range   Magnesium 2.0 1.7 - 2.4 mg/dL    Comment: Performed at [facility], [address] [place], [place] [address]  Phosphorus     Status: None   Collection Time: 10/12/20  4:25 AM  Result Value Ref Range   Phosphorus 4.2 2.5 - 4.6 mg/dL    Comment: Performed at [facility],  [place], [place] [address]  Glucose, capillary     Status: Abnormal   Collection Time: 10/12/20  7:38 AM  Result Value Ref Range   Glucose-Capillary 105 (H) 70 - 99 mg/dL    Comment: Glucose reference range applies only to samples taken after fasting for at least 8 hours.    Current Facility-Administered Medications  Medication Dose Route Frequency Provider Last Rate Last Admin  . 0.9 %  sodium chloride infusion   Intravenous Continuous Eulogio Bear U, DO 50 mL/hr at 10/12/20 0236 Infusion Verify at 10/12/20 0236  . acetaminophen (TYLENOL) suppository 650 mg  650 mg Rectal Q6H PRN Ivor Costa, MD      . acetaminophen (TYLENOL) tablet 650 mg  650 mg Oral Q6H PRN Ivor Costa, MD       . albuterol (VENTOLIN HFA) 108 (90 Base) MCG/ACT inhaler 2 puff  2 puff Inhalation Q4H PRN Ivor Costa, MD      . ARIPiprazole (ABILIFY) tablet 1 mg  1 mg Oral QHS Casidy Alberta T, MD      . atenolol (TENORMIN) tablet 25 mg  25 mg Oral Daily Vann, Jessica U, DO   25 mg at 10/12/20 1017  . calcium-vitamin D (OSCAL WITH D) 500-200 MG-UNIT per tablet 1 tablet  1 tablet Oral BID Ivor Costa, MD   1 tablet at 10/12/20 1016  . enoxaparin (LOVENOX) injection 30 mg  30 mg Subcutaneous Q24H Ivor Costa, MD   30 mg at 10/11/20 2103  . folic acid (FOLVITE) tablet 1 mg  1 mg Oral Daily Vann, Jessica U, DO   1 mg at 10/12/20 1016  . hydrALAZINE (APRESOLINE) injection 5 mg  5 mg Intravenous Q2H PRN Ivor Costa, MD      . melatonin tablet 5 mg  5 mg Oral QPM Val Riles, MD   5 mg at 10/11/20 1750  . mirtazapine (REMERON SOL-TAB) disintegrating tablet 15 mg  15 mg Oral QHS Clarrisa Kaylor T, MD      . multivitamin with minerals tablet 1 tablet  1 tablet Oral Daily Eulogio Bear U, DO   1 tablet at 10/12/20 1016  . multivitamin-lutein (OCUVITE-LUTEIN) capsule 1 capsule  1 capsule Oral Daily Ivor Costa, MD   1 capsule at 10/12/20 1017  . naphazoline-glycerin (CLEAR EYES REDNESS) ophth solution 1 drop  1 drop Both Eyes QID PRN Ivor Costa, MD      . omega-3 acid ethyl esters (LOVAZA) capsule 1 g  1 g Oral Daily Ivor Costa, MD   1 g at 10/12/20 1016  . ondansetron (ZOFRAN) injection 4 mg  4 mg Intravenous Q8H PRN Ivor Costa, MD      . pravastatin (PRAVACHOL) tablet 20 mg  20 mg Oral q1800 Ivor Costa, MD   20 mg at 10/11/20 1750  . thiamine tablet 100 mg  100 mg Oral Daily Eulogio Bear U, DO   100 mg at 10/12/20 1016   Or  . thiamine (B-1) injection 100 mg  100 mg Intravenous Daily Vann, Jessica U, DO      . Vitamin D (Ergocalciferol) (DRISDOL) capsule 50,000 Units  50,000 Units Oral Q7 days Val Riles, MD   50,000 Units at 10/11/20 1040    Musculoskeletal: Strength & Muscle Tone: decreased Gait & Station:  unsteady Patient leans: N/A            Psychiatric Specialty Exam:  Presentation  General Appearance: No data recorded Eye Contact:No data recorded Speech:No data recorded Speech Volume:No  data recorded Handedness:No data recorded  Mood and Affect  Mood:No data recorded Affect:No data recorded  Thought Process  Thought Processes:No data recorded Descriptions of Associations:No data recorded Orientation:No data recorded Thought Content:No data recorded History of Schizophrenia/Schizoaffective disorder:No data recorded Duration of Psychotic Symptoms:No data recorded Hallucinations:No data recorded Ideas of Reference:No data recorded Suicidal Thoughts:No data recorded Homicidal Thoughts:No data recorded  Sensorium  Memory:No data recorded Judgment:No data recorded Insight:No data recorded  Executive Functions  Concentration:No data recorded Attention Span:No data recorded Recall:No data recorded Fund of Knowledge:No data recorded Language:No data recorded  Psychomotor Activity  Psychomotor Activity:No data recorded  Assets  Assets:No data recorded  Sleep  Sleep:No data recorded  Physical Exam: Physical Exam Vitals and nursing note reviewed.  Constitutional:      Appearance: She is ill-appearing.  HENT:     Head: Normocephalic and atraumatic.     Mouth/Throat:     Pharynx: Oropharynx is clear.  Eyes:     Pupils: Pupils are equal, round, and reactive to light.  Cardiovascular:     Rate and Rhythm: Normal rate and regular rhythm.  Pulmonary:     Effort: Pulmonary effort is normal.     Breath sounds: Normal breath sounds.  Abdominal:     General: Abdomen is flat.     Palpations: Abdomen is soft.  Musculoskeletal:        General: Normal range of motion.  Skin:    General: Skin is warm and dry.  Neurological:     General: No focal deficit present.     Mental Status: She is alert. Mental status is at baseline.  Psychiatric:        Attention  and Perception: Attention normal. She perceives auditory hallucinations.        Mood and Affect: Mood is anxious and depressed.        Speech: Speech is tangential.        Behavior: Behavior is agitated. Behavior is not aggressive.        Thought Content: Thought content is paranoid. Thought content does not include homicidal or suicidal ideation.        Cognition and Memory: Cognition is impaired. Memory is impaired.        Judgment: Judgment is inappropriate.    Review of Systems  Constitutional: Negative.   HENT: Negative.   Eyes: Negative.   Respiratory: Negative.   Cardiovascular: Negative.   Gastrointestinal: Negative.   Musculoskeletal: Negative.   Skin: Negative.   Neurological: Negative.   Psychiatric/Behavioral: Positive for hallucinations. Negative for substance abuse and suicidal ideas. The patient is nervous/anxious and has insomnia.    Blood pressure (!) 176/71, pulse (!) 53, temperature 98 F (36.7 C), temperature source Oral, resp. rate 18, weight 36.7 kg, SpO2 96 %. Body mass index is 16.6 kg/m.  Treatment Plan Summary: Daily contact with patient to assess and evaluate symptoms and progress in treatment, Medication management and Plan 87 year old woman who admits to anxiety and clearly is having some confused thinking with psychotic-like symptoms but does not seem to recognize this as being part of depression.  She talks a great deal about everything being hopeless there being no chance of anything getting any better.  He refused to participate in PT and OT today on the grounds that nothing could possibly get any better.  I think the patient is having a severe depression with psychotic features.  Gently explained this to the patient and daughter while suggesting there was ample therapy.  Suggest  discontinuing the Seroquel and replacing with mirtazapine and Abilify starting with very low doses.  It is possible that she might benefit from geriatric psychiatry treatment  although it would not be clear to me if they would accept her at this point.  For now I am going to start her on medication and we will follow-up.  If she needs follow-up from a psychiatrist over the weekend please notify them otherwise I will see her on Monday.  Strongly suggested to patient and daughter that seeing a therapist outside of the hospital could be very beneficial.  I know of 1 good therapist in the area who takes Medicare and that is Miguel Dibble.  Disposition: Supportive therapy provided about ongoing stressors. Discussed crisis plan, support from social network, calling 911, coming to the Emergency Department, and calling Suicide Hotline.  Alethia Berthold, MD 10/12/2020 7:01 PM

## 2020-10-12 NOTE — Plan of Care (Addendum)
Patient is alert and orientedx3 but forgetful and has confusion. V/S stable. Denies any pain or SOB. Slept well all night. Attempted to wash her hair but pt declined at this time. Will pass it on to day shift.

## 2020-10-12 NOTE — Progress Notes (Signed)
OT Cancellation Note  Patient Details Name: Angela Mason MRN: [id number] DOB: 04-16-1933   Cancelled Treatment:    Reason Eval/Treat Not Completed: Patient declined, no reason specified. Pt declining therapy this afternoon despite encouragement. Will re-attempt at later date/time as able.   Hanley Hays, MPH, MS, OTR/L ascom [phone] 10/12/20, 2:34 PM

## 2020-10-12 NOTE — Care Management Important Message (Signed)
Important Message  Patient Details  Name: Angela Mason MRN: [id number] Date of Birth: 1932-11-30   Medicare Important Message Given:  Yes     Dannette Sandee 10/12/2020, 12:28 PM

## 2020-10-13 DIAGNOSIS — F323 Major depressive disorder, single episode, severe with psychotic features: Secondary | ICD-10-CM

## 2020-10-13 DIAGNOSIS — I160 Hypertensive urgency: Secondary | ICD-10-CM | POA: Diagnosis not present

## 2020-10-13 LAB — CBC
HCT: 38 % (ref 36.0–46.0)
Hemoglobin: 12.8 g/dL (ref 12.0–15.0)
MCH: 32.6 pg (ref 26.0–34.0)
MCHC: 33.7 g/dL (ref 30.0–36.0)
MCV: 96.7 fL (ref 80.0–100.0)
Platelets: 245 10*3/uL (ref 150–400)
RBC: 3.93 MIL/uL (ref 3.87–5.11)
RDW: 12.2 % (ref 11.5–15.5)
WBC: 8.6 10*3/uL (ref 4.0–10.5)
nRBC: 0 % (ref 0.0–0.2)

## 2020-10-13 LAB — BASIC METABOLIC PANEL
Anion gap: 6 (ref 5–15)
BUN: 16 mg/dL (ref 8–23)
CO2: 33 mmol/L — ABNORMAL HIGH (ref 22–32)
Calcium: 9.5 mg/dL (ref 8.9–10.3)
Chloride: 97 mmol/L — ABNORMAL LOW (ref 98–111)
Creatinine, Ser: 0.63 mg/dL (ref 0.44–1.00)
GFR, Estimated: >60 mL/min (ref >60)
Glucose, Bld: 106 mg/dL — ABNORMAL HIGH (ref 70–99)
Potassium: 4.2 mmol/L (ref 3.5–5.1)
Sodium: 136 mmol/L (ref 135–145)

## 2020-10-13 LAB — MAGNESIUM: Magnesium: 1.8 mg/dL (ref 1.7–2.4)

## 2020-10-13 LAB — GLUCOSE, CAPILLARY: Glucose-Capillary: 93 mg/dL (ref 70–99)

## 2020-10-13 NOTE — Progress Notes (Signed)
PROGRESS NOTE    Angela Mason   TKW:[id number]  DOB: [birth year]  PCP: Albina Billet, MD    DOA: 10/04/2020 LOS: 8   Brief Narrative   Angela Mason an 87 y.o.femalewith medical history significant ofhypertension, hyperlipidemia, COPD, hypothyroidism, cystocele, who presents with altered mental status.  Per report, and her normal baseline, patient is independent,alert, oriented, and candrive car. Pt was noted to be confused in the past several days, withparanoia, not acting like herself.She has poor appetite and decreased oral intake. She was diagnosed with UTI on 3/7 and started on cefuroxime, but was not clinically improving.    Assessment & Plan   Principal Problem:   Severe major depression, single episode, with psychotic features ([place]) Active Problems:   Acute metabolic encephalopathy   HLD (hyperlipidemia)   HTN (hypertension)   Hypothyroidism   Hyponatremia   Hypertensive urgency   UTI (urinary tract infection)   AMS (altered mental status)   Acute metabolic encephalopathy - Resolved. Severe depression with psychotic features Likely was multifactorial etiology, including hypertensive urgency, hyponatremia, UTI, possible PRES syndrome CT head negative for acute intracranial abnormalities. No focal neuro deficit on physical examination. -continues to be oriented to person/place/time but more paranoid and hostile/mis-trusting --Treat underlying issues as below -? PRES as BP was high on arrival, but MRI negative --Neurology consult appreciated- no fever, s/p IV acyclovir due to HSV suspicion which was discontinued as per neurology, no need of LP. MRI brain ruled out any acute events. EEG mild diffuse encephalopathy, no seizures or epileptiform discharges. --Psychiatry consult appreciate recommendations --started on Remeron and Abilify --consider geri-psych referral if needed  UTI - POA Patient was started onon cefuroxime3/7/22, urinalysis negative.   Seems unlikely contributing to current acute illness,  Has completed of 5 days (including outpt abx). --culture negative as treated outpatient  Hyperlipidemia - continue Pravastatin  Hx of HTN with Hypertensive Urgency - BP was 207/92, 182/91 upon admission.  Likely due to medication non-compliance. -continue homeAtenololbut change to daily-- HR in the 60s so not able to increase atenolol -much improved- appear to take her BP meds occasionally even in the hospital  H/o Hypothyroidism not taking meds now.  TSH normal.  Hyponatremia - Resolved.  Na was 124 upon admission, most likely due to topoor oral intake and dehydration vs SIADH. TSH, cortisol normal. --Continue 1500 cc/day fluid restriction  --Regular diet, no sodium restriction --Given ssalt tablet 1 g PO TID x 2 days --Monitor labs daily  ?Alcohol use - unclear history, was mentioned in H&P, but pt declined to clarify frequency or quanitites.  Was placed on CIWA protocol on admission.  No apparent withdrawal.  Underweight -  Body mass index is 16.6 kg/m. Dietician consulted.  Continue supplemental protein/nutrition drinks. Unrestricted diet.  Started on mirtazapine by psychiatry, should help appetite.  DVT prophylaxis: enoxaparin (LOVENOX) injection 30 mg Start: 10/04/20 2200   Diet:  Diet Orders (From admission, onward)    Start     Ordered   10/10/20 1321  Diet regular Room service appropriate? Yes; Fluid consistency: Thin; Fluid restriction: 1500 mL Fluid  Diet effective now       Question Answer Comment  Room service appropriate? Yes   Fluid consistency: Thin   Fluid restriction: 1500 mL Fluid      10/10/20 1320            Code Status: Full Code    Subjective 10/13/20    Pt seen with daughter at bedside today.  Daughter reports  200% improvement in patient's mental status today.  Patient reports feeling well.  She notes she has been confused and states she is feeling better today also.  She denies fevers  or chills, cough or congestion, nausea vomiting or other acute complaints.  Has not yet had a chance to work with physical therapy.  Daughter reminded her she refused yesterday and encouraged her to participate today.   Disposition Plan & Communication   Status is: Inpatient  Inpatient status remains appropriate due to severity of illness, therapy evaluations pending for disposition planning.  New psych medications requiring close monitoring and psych follow-up on Monday.  Dispo: The patient is from: ALF              Anticipated d/c is to: SNF              Patient currently is not medically stable for discharge   Difficult to place patient no   Family Communication: Daughter at bedside arouse today   Consults, Procedures, Significant Events   Consultants:   Psychiatry  Neurology  Procedures:   None  Antimicrobials:  Anti-infectives (From admission, onward)   Start     Dose/Rate Route Frequency Ordered Stop   10/11/20 2200  acyclovir (ZOVIRAX) 365 mg in dextrose 5 % 100 mL IVPB  Status:  Discontinued        10 mg/kg  36.7 kg 107.3 mL/hr over 60 Minutes Intravenous Every 24 hours 10/11/20 0832 10/11/20 1603   10/08/20 1100  acyclovir (ZOVIRAX) 365 mg in dextrose 5 % 100 mL IVPB  Status:  Discontinued        10 mg/kg  36.7 kg 107.3 mL/hr over 60 Minutes Intravenous Every 12 hours 10/08/20 0938 10/11/20 0832   10/07/20 1300  acyclovir (ZOVIRAX) 365 mg in dextrose 5 % 100 mL IVPB  Status:  Discontinued        10 mg/kg  36.7 kg 107.3 mL/hr over 60 Minutes Intravenous Every 24 hours 10/07/20 1136 10/07/20 1136   10/07/20 1300  acyclovir (ZOVIRAX) 365 mg in dextrose 5 % 100 mL IVPB  Status:  Discontinued        10 mg/kg  36.7 kg 107.3 mL/hr over 60 Minutes Intravenous Every 24 hours 10/07/20 1137 10/08/20 0938   10/04/20 1630  cefTRIAXone (ROCEPHIN) 1 g in sodium chloride 0.9 % 100 mL IVPB        1 g 200 mL/hr over 30 Minutes Intravenous Every 24 hours 10/04/20 1622 10/06/20  1827        Micro    Objective   Vitals:   10/12/20 1955 10/13/20 0025 10/13/20 0539 10/13/20 1121  BP: (!) 159/71 (!) 162/65 (!) 177/90 (!) 154/67  Pulse: 64 60 67 (!) 108  Resp: 20 20 20 18   Temp: 97.9 F (36.6 C) 97.8 F (36.6 C) (!) 97.4 F (36.3 C) 97.7 F (36.5 C)  TempSrc:  Oral Oral Oral  SpO2: 96% 96% 99% 98%  Weight:        Intake/Output Summary (Last 24 hours) at 10/13/2020 1701 Last data filed at 10/13/2020 1357 Gross per 24 hour  Intake 240 ml  Output 1300 ml  Net -1060 ml   Filed Weights   10/04/20 1640  Weight: 36.7 kg    Physical Exam:  General exam: awake, alert, no acute distress, underweight HEENT: atraumatic, clear conjunctiva, anicteric sclera, moist mucus membranes, hearing grossly normal  Respiratory system: CTAB, no wheezes, rales or rhonchi, normal respiratory effort. Cardiovascular system: normal S1/S2, RRR,  no JVD, murmurs, rubs, gallops, no pedal edema.   Gastrointestinal system: soft, NT, ND, no HSM felt, +bowel sounds. Central nervous system: no gross focal neurologic deficits, normal speech Extremities: moves all, no edema, normal tone Skin: dry, intact, normal temperature Psychiatry: normal mood, congruent affect  Labs   Data Reviewed: I have personally reviewed following labs and imaging studies  CBC: Recent Labs  Lab 10/11/20 0425 10/12/20 0425 10/13/20 0431  WBC 8.4 6.6 8.6  HGB 11.6* 10.9* 12.8  HCT 34.1* 32.9* 38.0  MCV 95.0 96.5 96.7  PLT 239 225 250   Basic Metabolic Panel: Recent Labs  Lab 10/08/20 0531 10/10/20 0445 10/11/20 0425 10/12/20 0425 10/13/20 0431  NA 126* 129* 133* 137 136  K 4.3 4.2 4.1 4.0 4.2  CL 90* 97* 102 104 97*  CO2 30 26 26 26  33*  GLUCOSE 82 73 88 83 106*  BUN 18 15 27* 21 16  CREATININE 0.71 0.64 1.00 0.71 0.63  CALCIUM 9.2 8.5* 8.6* 9.1 9.5  MG  --   --  2.0 2.0 1.8  PHOS  --   --  3.9 4.2  --    GFR: Estimated Creatinine Clearance: 29.8 mL/min (by C-G formula based on SCr  of 0.63 mg/dL). Liver Function Tests: No results for input(s): AST, ALT, ALKPHOS, BILITOT, PROT, ALBUMIN in the last 168 hours. No results for input(s): LIPASE, AMYLASE in the last 168 hours. No results for input(s): AMMONIA in the last 168 hours. Coagulation Profile: No results for input(s): INR, PROTIME in the last 168 hours. Cardiac Enzymes: No results for input(s): CKTOTAL, CKMB, CKMBINDEX, TROPONINI in the last 168 hours. BNP (last 3 results) No results for input(s): PROBNP in the last 8760 hours. HbA1C: No results for input(s): HGBA1C in the last 72 hours. CBG: Recent Labs  Lab 10/07/20 1649 10/08/20 0752 10/10/20 0746 10/12/20 0738 10/13/20 0738  GLUCAP 94 91 106* 105* 93   Lipid Profile: No results for input(s): CHOL, HDL, LDLCALC, TRIG, CHOLHDL, LDLDIRECT in the last 72 hours. Thyroid Function Tests: No results for input(s): TSH, T4TOTAL, FREET4, T3FREE, THYROIDAB in the last 72 hours. Anemia Panel: No results for input(s): VITAMINB12, FOLATE, FERRITIN, TIBC, IRON, RETICCTPCT in the last 72 hours. Sepsis Labs: No results for input(s): PROCALCITON, LATICACIDVEN in the last 168 hours.  Recent Results (from the past 240 hour(s))  Urine culture     Status: None   Collection Time: 10/04/20 12:30 PM   Specimen: Urine, Random  Result Value Ref Range Status   Specimen Description   Final    URINE, RANDOM Performed at [facility], [address], [place], [place] [address]    Special Requests   Final    NONE Performed at [facility], [address] [place], [place] [address]    Culture   Final    NO GROWTH Performed at [facility], [place] [address], [place], [place] [address]    Report Status 10/05/2020 FINAL  Final  Resp Panel by RT-PCR (Flu A&B, Covid) Nasopharyngeal Swab     Status: None   Collection Time: 10/04/20  2:22 PM   Specimen: Nasopharyngeal Swab; Nasopharyngeal(NP) swabs in vial transport medium  Result Value Ref Range  Status   SARS Coronavirus 2 by RT PCR NEGATIVE NEGATIVE Final    Comment: (NOTE) SARS-CoV-2 target nucleic acids are NOT DETECTED.  The SARS-CoV-2 RNA is generally detectable in upper respiratory specimens during the acute phase of infection. The lowest concentration of SARS-CoV-2  viral copies this assay can detect is 138 copies/mL. A negative result does not preclude SARS-Cov-2 infection and should not be used as the sole basis for treatment or other patient management decisions. A negative result may occur with  improper specimen collection/handling, submission of specimen other than nasopharyngeal swab, presence of viral mutation(s) within the areas targeted by this assay, and inadequate number of viral copies(<138 copies/mL). A negative result must be combined with clinical observations, patient history, and epidemiological information. The expected result is Negative.  Fact Sheet for Patients:  EntrepreneurPulse.com.au  Fact Sheet for Healthcare Providers:  IncredibleEmployment.be  This test is no t yet approved or cleared by the [place] FDA and  has been authorized for detection and/or diagnosis of SARS-CoV-2 by FDA under an Emergency Use Authorization (EUA). This EUA will remain  in effect (meaning this test can be used) for the duration of the COVID-19 declaration under Section 564(b)(1) of the Act, 21 U.S.C.section 360bbb-3(b)(1), unless the authorization is terminated  or revoked sooner.       Influenza A by PCR NEGATIVE NEGATIVE Final   Influenza B by PCR NEGATIVE NEGATIVE Final    Comment: (NOTE) The Xpert Xpress SARS-CoV-2/FLU/RSV plus assay is intended as an aid in the diagnosis of influenza from Nasopharyngeal swab specimens and should not be used as a sole basis for treatment. Nasal washings and aspirates are unacceptable for Xpert Xpress SARS-CoV-2/FLU/RSV testing.  Fact Sheet for  Patients: EntrepreneurPulse.com.au  Fact Sheet for Healthcare Providers: IncredibleEmployment.be  This test is not yet approved or cleared by the [place] FDA and has been authorized for detection and/or diagnosis of SARS-CoV-2 by FDA under an Emergency Use Authorization (EUA). This EUA will remain in effect (meaning this test can be used) for the duration of the COVID-19 declaration under Section 564(b)(1) of the Act, 21 U.S.C. section 360bbb-3(b)(1), unless the authorization is terminated or revoked.  Performed at [facility], [address], [place], [place] [address]       Imaging Studies   No results found.   Medications   Scheduled Meds: . ARIPiprazole  1 mg Oral QHS  . atenolol  25 mg Oral Daily  . calcium-vitamin D  1 tablet Oral BID  . enoxaparin (LOVENOX) injection  30 mg Subcutaneous Q24H  . folic acid  1 mg Oral Daily  . melatonin  5 mg Oral QPM  . mirtazapine  15 mg Oral QHS  . multivitamin with minerals  1 tablet Oral Daily  . multivitamin-lutein  1 capsule Oral Daily  . omega-3 acid ethyl esters  1 g Oral Daily  . pravastatin  20 mg Oral q1800  . thiamine  100 mg Oral Daily   Or  . thiamine  100 mg Intravenous Daily  . Vitamin D (Ergocalciferol)  50,000 Units Oral Q7 days   Continuous Infusions:     LOS: 8 days    Time spent: 30 minutes    Ezekiel Slocumb, DO [facility]  10/13/2020, 5:01 PM      If 7PM-7AM, please contact night-coverage. How to contact the [facility] Attending or Consulting provider [place] or covering provider during after hours [place], for this patient?    1. Check the care team in [facility] and look for a) attending/consulting TR[facility] provider listed and b) the [facility] team listed 2. Log into [email or web address] and use [place] universal password to access. If you do not have the password, please contact the hospital operator. 3. Locate the [facility]  provider you are looking for  under [facility] and page to a number that you can be directly reached. 4. If you still have difficulty reaching the provider, please page the [facility] (Director on Call) for the Hospitalists listed on amion for assistance.

## 2020-10-14 DIAGNOSIS — E871 Hypo-osmolality and hyponatremia: Secondary | ICD-10-CM | POA: Diagnosis not present

## 2020-10-14 DIAGNOSIS — I10 Essential (primary) hypertension: Secondary | ICD-10-CM | POA: Diagnosis not present

## 2020-10-14 DIAGNOSIS — F323 Major depressive disorder, single episode, severe with psychotic features: Secondary | ICD-10-CM | POA: Diagnosis not present

## 2020-10-14 LAB — BASIC METABOLIC PANEL
Anion gap: 5 (ref 5–15)
BUN: 19 mg/dL (ref 8–23)
CO2: 32 mmol/L (ref 22–32)
Calcium: 9.1 mg/dL (ref 8.9–10.3)
Chloride: 95 mmol/L — ABNORMAL LOW (ref 98–111)
Creatinine, Ser: 0.6 mg/dL (ref 0.44–1.00)
GFR, Estimated: >60 mL/min (ref >60)
Glucose, Bld: 87 mg/dL (ref 70–99)
Potassium: 4 mmol/L (ref 3.5–5.1)
Sodium: 132 mmol/L — ABNORMAL LOW (ref 135–145)

## 2020-10-14 LAB — CBC
HCT: 33 % — ABNORMAL LOW (ref 36.0–46.0)
Hemoglobin: 10.9 g/dL — ABNORMAL LOW (ref 12.0–15.0)
MCH: 32.1 pg (ref 26.0–34.0)
MCHC: 33 g/dL (ref 30.0–36.0)
MCV: 97.1 fL (ref 80.0–100.0)
Platelets: 242 10*3/uL (ref 150–400)
RBC: 3.4 MIL/uL — ABNORMAL LOW (ref 3.87–5.11)
RDW: 12.2 % (ref 11.5–15.5)
WBC: 6.8 10*3/uL (ref 4.0–10.5)
nRBC: 0 % (ref 0.0–0.2)

## 2020-10-14 MED ORDER — SODIUM CHLORIDE 1 G PO TABS
1.0000 g | ORAL_TABLET | Freq: Two times a day (BID) | ORAL | Status: DC
  Administered 2020-10-14: 1 g via ORAL
  Filled 2020-10-14 (×3): qty 1

## 2020-10-14 NOTE — Plan of Care (Signed)

## 2020-10-14 NOTE — TOC Progression Note (Signed)
Transition of Care [facility]) - Progression Note    Patient Details  Name: Angela Mason MRN: [id number] Date of Birth: Aug 09, 1932  Transition of [facility]) CM/SW Contact  Harriet Masson, RN Phone Number: [phone] 10/14/2020, 1:06 PM  Clinical Narrative:    Pt to be discharged possibly tomorrow back to [facility] however requesting SNF. Spoke with daughter Magda Paganini [phone]) twice today concerning pt's discharge plans however it was related to SNF level of care not returning home with HHPT. PT evaluated 3/16 and recommended [facility] PT with supervision. Spoke with Seth Bake via [facility] who indicated if pt need SNF level of care she would need PT/OT evaluation for SNF recommendations, D/C summary, co-vid this admission and FL2 for SNF. RN requested Dr. Arbutus Ped speak with the daughter concerning any changes in pt's care related to discharge.   TOC team will continue to follow.   Expected Discharge Plan: [place] Barriers to Discharge: Continued Medical Work up  Expected Discharge Plan and Services Expected Discharge Plan: [place] In-house Referral: Clinical Social Work     Living arrangements for the past 2 months: [place]: PT,OT [place]: [place] ([facility]) Date [place]: 10/07/20 Time [place]: 1706 Representative spoke with at [place]: [place] ([place]) Interventions    Readmission Risk Interventions No flowsheet data found.

## 2020-10-14 NOTE — Progress Notes (Signed)
PROGRESS NOTE    Angela Mason   IZT:[id number]  DOB: 09-25-1932  PCP: Albina Billet, MD    DOA: 10/04/2020 LOS: 9   Brief Narrative   Angela Dicktenis an 87 y.o.femalewith medical history significant ofhypertension, hyperlipidemia, COPD, hypothyroidism, cystocele, who presents with altered mental status.  Per report, and her normal baseline, patient is independent,alert, oriented, and candrive car. Pt was noted to be confused in the past several days, withparanoia, not acting like herself.She has poor appetite and decreased oral intake. She was diagnosed with UTI on 3/7 and started on cefuroxime, but was not clinically improving.    Assessment & Plan   Principal Problem:   Severe major depression, single episode, with psychotic features ([place]) Active Problems:   Acute metabolic encephalopathy   HLD (hyperlipidemia)   HTN (hypertension)   Hypothyroidism   Hyponatremia   Hypertensive urgency   UTI (urinary tract infection)   AMS (altered mental status)   Acute metabolic encephalopathy - Resolved. Severe depression with psychotic features Likely was multifactorial etiology, including hypertensive urgency, hyponatremia, UTI, ?PRES syndrome. CT head negative for acute intracranial abnormalities.  No focal neuro deficit on physical examination. -continues to be oriented to person/place/time but more paranoid and mis-trusting - psych feels is severe depressive episode with psychotic features --Treat underlying issues as below -? PRES as BP was high on arrival, but MRI negative --Neurology consult appreciated- no fever, s/p IV acyclovir due to HSV suspicion which was discontinued as per neurology, no need of LP. MRI brain ruled out any acute events. EEG mild diffuse encephalopathy, no seizures or epileptiform discharges. --Psychiatry consult appreciate recommendations --started on Remeron and Abilify --consider geri-psych referral if needed  Generalized Weakness /  Physical Debility - PT and OT re-evaluations pending as patient has become increasingly weak since admission.  Pt unable to have 24 hr supervision and assistance in her current home.  Will require SNF placement prior to safe return home.  UTI - POA Patient was previously started onon cefuroxime3/7/22, urinalysis negative here.  Seems unlikely contributing to current acute illness.   Has completed of 5 days (including outpt abx).  Hyperlipidemia - continue Pravastatin  Hx of HTN with Hypertensive Urgency - BP was 207/92, 182/91 upon admission.  Likely due to medication non-compliance. -continue homeAtenololbut change to daily-- HR in the 60s so not able to increase atenolol -much improved- appear to take her BP meds occasionally even in the hospital  H/o Hypothyroidism not taking meds now.  TSH normal.  Hyponatremia - Resolved.  Na was 124 upon admission, most likely due to topoor oral intake and dehydration vs SIADH. TSH, cortisol normal. --Continue 1500 cc/day fluid restriction  --Regular diet, no sodium restriction --Given ssalt tablet 1 g PO TID x 2 days --Monitor labs daily  ?Alcohol use - unclear history, was mentioned in H&P, but pt declined to clarify frequency or quanitites.  Was placed on CIWA protocol on admission.  No apparent withdrawal.  Underweight -  Body mass index is 16.6 kg/m. Dietician consulted.  Continue supplemental protein/nutrition drinks. Unrestricted diet.  Started on mirtazapine by psychiatry, should help appetite.  DVT prophylaxis: enoxaparin (LOVENOX) injection 30 mg Start: 10/04/20 2200   Diet:  Diet Orders (From admission, onward)    Start     Ordered   10/10/20 1321  Diet regular Room service appropriate? Yes; Fluid consistency: Thin; Fluid restriction: 1500 mL Fluid  Diet effective now       Question Answer Comment  Room service appropriate? Yes  Fluid consistency: Thin   Fluid restriction: 1500 mL Fluid      10/10/20 1320             Code Status: Full Code    Subjective 10/14/20    Pt having more paranoid delusional thoughts again today.  She'd done better yesterday.  Daughter expresses concern.  Pt thinks staff stole her wallet which daughter reassures her she had taken home with her.  Pt otherwise reports feeling fair and has no acute complaints.   Disposition Plan & Communication   Status is: Inpatient  Inpatient status remains appropriate due to severity of illness, therapy evaluations for safest disposition.  New psych medications requiring close monitoring and psychiatry follow-up on Monday.  Dispo: The patient is from: ALF              Anticipated d/c is to: SNF              Patient currently is not medically stable for discharge   Difficult to place patient no   Family Communication: Daughter at bedside arouse today   Consults, Procedures, Significant Events   Consultants:   Psychiatry  Neurology  Procedures:   None  Antimicrobials:  Anti-infectives (From admission, onward)   Start     Dose/Rate Route Frequency Ordered Stop   10/11/20 2200  acyclovir (ZOVIRAX) 365 mg in dextrose 5 % 100 mL IVPB  Status:  Discontinued        10 mg/kg  36.7 kg 107.3 mL/hr over 60 Minutes Intravenous Every 24 hours 10/11/20 0832 10/11/20 1603   10/08/20 1100  acyclovir (ZOVIRAX) 365 mg in dextrose 5 % 100 mL IVPB  Status:  Discontinued        10 mg/kg  36.7 kg 107.3 mL/hr over 60 Minutes Intravenous Every 12 hours 10/08/20 0938 10/11/20 0832   10/07/20 1300  acyclovir (ZOVIRAX) 365 mg in dextrose 5 % 100 mL IVPB  Status:  Discontinued        10 mg/kg  36.7 kg 107.3 mL/hr over 60 Minutes Intravenous Every 24 hours 10/07/20 1136 10/07/20 1136   10/07/20 1300  acyclovir (ZOVIRAX) 365 mg in dextrose 5 % 100 mL IVPB  Status:  Discontinued        10 mg/kg  36.7 kg 107.3 mL/hr over 60 Minutes Intravenous Every 24 hours 10/07/20 1137 10/08/20 0938   10/04/20 1630  cefTRIAXone (ROCEPHIN) 1 g in sodium chloride  0.9 % 100 mL IVPB        1 g 200 mL/hr over 30 Minutes Intravenous Every 24 hours 10/04/20 1622 10/06/20 1827        Micro    Objective   Vitals:   10/13/20 0539 10/13/20 1121 10/13/20 1947 10/14/20 0455  BP: (!) 177/90 (!) 154/67 (!) 96/42 (!) 144/66  Pulse: 67 (!) 108 60 63  Resp: 20 18 15 15   Temp: (!) 97.4 F (36.3 C) 97.7 F (36.5 C) 98 F (36.7 C) 98 F (36.7 C)  TempSrc: Oral Oral Oral Oral  SpO2: 99% 98% 95% 98%  Weight:        Intake/Output Summary (Last 24 hours) at 10/14/2020 1639 Last data filed at 10/14/2020 0457 Gross per 24 hour  Intake --  Output 450 ml  Net -450 ml   Filed Weights   10/04/20 1640  Weight: 36.7 kg    Physical Exam:  General exam: awake, alert, no acute distress, underweight, frail Respiratory system: CTAB, no wheezes, rales or rhonchi, normal respiratory effort. Cardiovascular  system: normal S1/S2, RRR Central nervous system: no gross focal neurologic deficits, normal speech Extremities: moves all, no edema, normal tone Psychiatry: normal mood, congruent affect, expresses paranoid thoughts, abnormal judgment and insight  Labs   Data Reviewed: I have personally reviewed following labs and imaging studies  CBC: Recent Labs  Lab 10/11/20 0425 10/12/20 0425 10/13/20 0431 10/14/20 0449  WBC 8.4 6.6 8.6 6.8  HGB 11.6* 10.9* 12.8 10.9*  HCT 34.1* 32.9* 38.0 33.0*  MCV 95.0 96.5 96.7 97.1  PLT 239 225 245 177   Basic Metabolic Panel: Recent Labs  Lab 10/10/20 0445 10/11/20 0425 10/12/20 0425 10/13/20 0431 10/14/20 0449  NA 129* 133* 137 136 132*  K 4.2 4.1 4.0 4.2 4.0  CL 97* 102 104 97* 95*  CO2 26 26 26  33* 32  GLUCOSE 73 88 83 106* 87  BUN 15 27* 21 16 19   CREATININE 0.64 1.00 0.71 0.63 0.60  CALCIUM 8.5* 8.6* 9.1 9.5 9.1  MG  --  2.0 2.0 1.8  --   PHOS  --  3.9 4.2  --   --    GFR: Estimated Creatinine Clearance: 29.8 mL/min (by C-G formula based on SCr of 0.6 mg/dL). Liver Function Tests: No results for  input(s): AST, ALT, ALKPHOS, BILITOT, PROT, ALBUMIN in the last 168 hours. No results for input(s): LIPASE, AMYLASE in the last 168 hours. No results for input(s): AMMONIA in the last 168 hours. Coagulation Profile: No results for input(s): INR, PROTIME in the last 168 hours. Cardiac Enzymes: No results for input(s): CKTOTAL, CKMB, CKMBINDEX, TROPONINI in the last 168 hours. BNP (last 3 results) No results for input(s): PROBNP in the last 8760 hours. HbA1C: No results for input(s): HGBA1C in the last 72 hours. CBG: Recent Labs  Lab 10/07/20 1649 10/08/20 0752 10/10/20 0746 10/12/20 0738 10/13/20 0738  GLUCAP 94 91 106* 105* 93   Lipid Profile: No results for input(s): CHOL, HDL, LDLCALC, TRIG, CHOLHDL, LDLDIRECT in the last 72 hours. Thyroid Function Tests: No results for input(s): TSH, T4TOTAL, FREET4, T3FREE, THYROIDAB in the last 72 hours. Anemia Panel: No results for input(s): VITAMINB12, FOLATE, FERRITIN, TIBC, IRON, RETICCTPCT in the last 72 hours. Sepsis Labs: No results for input(s): PROCALCITON, LATICACIDVEN in the last 168 hours.  No results found for this or any previous visit (from the past 240 hour(s)).    Imaging Studies   No results found.   Medications   Scheduled Meds: . ARIPiprazole  1 mg Oral QHS  . atenolol  25 mg Oral Daily  . calcium-vitamin D  1 tablet Oral BID  . enoxaparin (LOVENOX) injection  30 mg Subcutaneous Q24H  . folic acid  1 mg Oral Daily  . melatonin  5 mg Oral QPM  . mirtazapine  15 mg Oral QHS  . multivitamin with minerals  1 tablet Oral Daily  . multivitamin-lutein  1 capsule Oral Daily  . omega-3 acid ethyl esters  1 g Oral Daily  . pravastatin  20 mg Oral q1800  . sodium chloride  1 g Oral BID WC  . thiamine  100 mg Oral Daily   Or  . thiamine  100 mg Intravenous Daily  . Vitamin D (Ergocalciferol)  50,000 Units Oral Q7 days   Continuous Infusions:     LOS: 9 days    Time spent: 35 minutes with > 50% spent at  bedside and in coordination of care    Ezekiel Slocumb, DO [facility]  10/14/2020, 4:39 PM  If 7PM-7AM, please contact night-coverage. How to contact the [facility] Attending or Consulting provider [place] or covering provider during after hours [place], for this patient?    1. Check the care team in [facility] and look for a) attending/consulting [facility] provider listed and b) the [facility] team listed 2. Log into [email or web address] and use [place]'s universal password to access. If you do not have the password, please contact the hospital operator. 3. Locate the [facility] provider you are looking for under [facility] and page to a number that you can be directly reached. 4. If you still have difficulty reaching the provider, please page the  [facility] (Director on Call) for the Hospitalists listed on amion for assistance.

## 2020-10-15 DIAGNOSIS — F323 Major depressive disorder, single episode, severe with psychotic features: Secondary | ICD-10-CM | POA: Diagnosis not present

## 2020-10-15 LAB — CBC
HCT: 34.4 % — ABNORMAL LOW (ref 36.0–46.0)
Hemoglobin: 11.3 g/dL — ABNORMAL LOW (ref 12.0–15.0)
MCH: 32.1 pg (ref 26.0–34.0)
MCHC: 32.8 g/dL (ref 30.0–36.0)
MCV: 97.7 fL (ref 80.0–100.0)
Platelets: 231 10*3/uL (ref 150–400)
RBC: 3.52 MIL/uL — ABNORMAL LOW (ref 3.87–5.11)
RDW: 12.3 % (ref 11.5–15.5)
WBC: 6.7 10*3/uL (ref 4.0–10.5)
nRBC: 0 % (ref 0.0–0.2)

## 2020-10-15 LAB — BASIC METABOLIC PANEL
Anion gap: 4 — ABNORMAL LOW (ref 5–15)
BUN: 21 mg/dL (ref 8–23)
CO2: 32 mmol/L (ref 22–32)
Calcium: 9 mg/dL (ref 8.9–10.3)
Chloride: 97 mmol/L — ABNORMAL LOW (ref 98–111)
Creatinine, Ser: 0.65 mg/dL (ref 0.44–1.00)
GFR, Estimated: >60 mL/min (ref >60)
Glucose, Bld: 99 mg/dL (ref 70–99)
Potassium: 4.3 mmol/L (ref 3.5–5.1)
Sodium: 133 mmol/L — ABNORMAL LOW (ref 135–145)

## 2020-10-15 LAB — SARS CORONAVIRUS 2 (TAT 6-24 HRS): SARS Coronavirus 2: NEGATIVE

## 2020-10-15 LAB — GLUCOSE, CAPILLARY: Glucose-Capillary: 89 mg/dL (ref 70–99)

## 2020-10-15 MED ORDER — MIRTAZAPINE 15 MG PO TBDP
30.0000 mg | ORAL_TABLET | Freq: Every day | ORAL | Status: DC
  Administered 2020-10-15: 30 mg via ORAL
  Filled 2020-10-15 (×2): qty 2

## 2020-10-15 MED ORDER — ARIPIPRAZOLE 2 MG PO TABS
2.0000 mg | ORAL_TABLET | Freq: Every day | ORAL | Status: DC
  Administered 2020-10-15: 2 mg via ORAL
  Filled 2020-10-15 (×2): qty 1

## 2020-10-15 NOTE — Evaluation (Signed)
Occupational Therapy Evaluation Patient Details Name: Angela Mason MRN: [id number] DOB: October 20, 1932 Today's Date: 10/15/2020    History of Present Illness 87 y.o. female with medical history significant of hypertension, hyperlipidemia, COPD, hypothyroidism, cystocele, who presents with altered mental status.   Per report, and her normal baseline, patient is independent, normally alert, oriented, and can drive car. Pt was noted to be confused in the past severe days, with paranoia. She is not acting like herself.    She has poor appetite and decreased oral intake. She was diagnosed with UTI on 3/7 and started on cefuroxime.   Clinical Impression   Pt seen for re-evaluation this date per physician and family request. Pt received in bed and agreeable to OT session. Pt pleasant, however appears reserved, expressing frustration regarding lack of ability/opportunity to get out of bed. Emotional support and active listening utilized and pt encouraged that therapist was here to support her in this goal. Pt performed sup>sit EOB with supervision, increased time/effort, and reliance on bed rail to support movement. Once EOB, pt required CGA initially for stability. BUE trength grossly 4/5 bilaterally, BLE strength grossly 4-/5 bilaterally. Pt provided with set up for combing her hand and for donning shoes while seated EOB, requiring CGA as pt tied her shoes to prevent too much anterior weight shift. Pt required MIN A for STS transfer with RW. Once in standing, pt took a few steps forward to stand at the sink with VC for standing inside RW with pt transitioning from BUE on RW to forearms on counter for support. Pt able to wash hands and face with UE vs BUE support on counter with minimal cues for problem solving. Pt ambulated around bed with RW and CGA-Min A with cues for safety in order to negotiate obstacles safely with RW getting caught up once requiring assist from therapist to adjust. Pt set up for meal in  recliner and began to eat at end of session.   Discussion held with daughter regarding pt's cognitive status and recent care giving duties for pt's spouse and daughter's concern regarding pt's safety and emotional/mental well-being. Per daughter, pt was active and independent prior to spouse's recent health challenges and decline. Spouse currently at [facility] for short term rehab. Daughter reports inability to provide 24/7 supervision/assist for pt at home at this time. Pt is currently at a very high risk of falls, further functional decline, and higher level of care/readmission. Pt seen last week by different therapist and appears to have been a little more independent at that time (supervision-CGA for mobility/ADL). Given lack of progress and slight decline in function/safety as well as recent (situational?) stress/cognitive impairments, recommendation updated to short term rehab. ALF after rehab may be appropriate and daughter reports conversations with [facility] staff are taking place to determine longer term care needs. Will continue to re-assess as pt progresses with therapy during hospitalization.    Follow Up Recommendations  SNF;Supervision/Assistance - 24 hour    Equipment Recommendations  Other (comment) (2WW)    Recommendations for Other Services       Precautions / Restrictions Precautions Precautions: Fall Restrictions Weight Bearing Restrictions: No      Mobility Bed Mobility Overal bed mobility: Needs Assistance Bed Mobility: Supine to Sit     Supine to sit: Supervision;HOB elevated     General bed mobility comments: increased time/effort to peform with reliance on bed rail, supervision for safety    Transfers Overall transfer level: Needs assistance Equipment used: Rolling walker (2 wheeled)  Transfers: Sit to/from Stand Sit to Stand: Min assist;From elevated surface         General transfer comment: Min A to power up and initially for balance    Balance  Overall balance assessment: Needs assistance Sitting-balance support: Feet supported;No upper extremity supported Sitting balance-Leahy Scale: Fair Sitting balance - Comments: mild unsteadiness with reaching outside BOS but no overt LOB   Standing balance support: No upper extremity supported;During functional activity;Single extremity supported Standing balance-Leahy Scale: Poor Standing balance comment: pt requires UE and intermittent BUE support on counter while standing at sink for grooming tasks                           ADL either performed or assessed with clinical judgement   ADL Overall ADL's : Needs assistance/impaired Eating/Feeding: Independent   Grooming: Standing;Wash/dry face;Wash/dry hands;Brushing hair;Sitting Grooming Details (indicate cue type and reason): pt required set up and supervision to comb hair in sitting EOB; CGA for standing at sink to wash face/hands for balance and occasional cues for problem solving (using washcloth to help get soap off her face versus her hands only, dry hands with washcloth next to her before placing hands back on RW, cues for RW support prior to turning around     Lower Body Bathing: Sitting/lateral leans;Min guard;Set up Lower Body Bathing Details (indicate cue type and reason): set up and CGA to don shoes while EOB with pt bending forward to reach feet, increased time/effort required                 Tub/ Shower Transfer: Min guard;Minimal assistance;Ambulation;Rolling walker           Vision Patient Visual Report: No change from baseline       Perception     Praxis      Pertinent Vitals/Pain Pain Assessment: No/denies pain     Hand Dominance Right   Extremity/Trunk Assessment Upper Extremity Assessment Upper Extremity Assessment: Overall WFL for tasks assessed;Generalized weakness (grossly at least 4/5 bilat)   Lower Extremity Assessment Lower Extremity Assessment: Generalized weakness (grossly at  least 4-/5 bilat)   Cervical / Trunk Assessment Cervical / Trunk Assessment: Normal   Communication Communication Communication: No difficulties   Cognition Arousal/Alertness: Awake/alert Behavior During Therapy: WFL for tasks assessed/performed Overall Cognitive Status: Impaired/Different from baseline Area of Impairment: Problem solving                             Problem Solving: Slow processing;Difficulty sequencing;Requires verbal cues General Comments: alert and oriented, pleasant but reserved, difficulty noted with expressing hope for recovery, some mild problem solving difficulties requiring cues   General Comments       Exercises Other Exercises Other Exercises: Seated and standing grooming tasks, ADL transfer training with RW requiring cues, negotiating environmental obstacles/safety hazards requiring MIN verbal cues to prevent RW hitting obstacles and to maintain safety without leaving RW behind Other Exercises: Discussion with daughter re: POC, d/c options, functional performance and deficits impacting safety   Shoulder Instructions      Home Living Family/patient expects to be discharged to:: Private residence Living Arrangements: Spouse/significant other Available Help at Discharge: Family;Other (Comment) (daughter present for short period of time, lives in [place], [place], and unable to provide 24/7) Type of Home: Independent living facility Home Access: Level entry     Home Layout: One level     Bathroom Shower/Tub: Walk-in  shower         Home Equipment: Bedside commode;Shower seat   Additional Comments: Pt reports her husband is currently in rehab after a fall.      Prior Functioning/Environment Level of Independence: Independent        Comments: per pt report, she drives, performs ADLs, and performs IADLs without AD. Per daughter present, pt has been caregiving for the pt's spouse more recently and this has had a significant impact on  the pt's overall well-being        OT Problem List: Decreased cognition;Decreased safety awareness;Decreased activity tolerance;Impaired balance (sitting and/or standing);Decreased knowledge of use of DME or AE      OT Treatment/Interventions: Self-care/ADL training;Therapeutic exercise;Therapeutic activities;Cognitive remediation/compensation;Energy conservation;DME and/or AE instruction;Patient/family education;Balance training    OT Goals(Current goals can be found in the care plan section) Acute Rehab OT Goals Patient Stated Goal: go to rehab to get stronger/safer OT Goal Formulation: With patient/family Time For Goal Achievement: 10/29/20 Potential to Achieve Goals: Good  OT Frequency: Min 2X/week   Barriers to D/C: Decreased caregiver support          Co-evaluation              AM-PAC OT "6 Clicks" Daily Activity     Outcome Measure Help from another person eating meals?: None Help from another person taking care of personal grooming?: A Little Help from another person toileting, which includes using toliet, bedpan, or urinal?: A Little Help from another person bathing (including washing, rinsing, drying)?: A Little Help from another person to put on and taking off regular upper body clothing?: None Help from another person to put on and taking off regular lower body clothing?: A Little 6 Click Score: 20   End of Session Equipment Utilized During Treatment: Gait belt;Rolling walker  Activity Tolerance: Patient tolerated treatment well Patient left: in chair;with call bell/phone within reach;with family/visitor present  OT Visit Diagnosis: Other abnormalities of gait and mobility (R26.89);Muscle weakness (generalized) (M62.81);History of falling (Z91.81);Other symptoms and signs involving cognitive function                Time: 2353-6144 OT Time Calculation (min): 52 min Charges:  OT General Charges $OT Visit: 1 Visit OT Evaluation $OT Re-eval: 1 Re-eval OT  Treatments $Self Care/Home Management : 38-52 mins  Hanley Hays, MPH, MS, OTR/L ascom [phone] 10/15/20, 11:27 AM

## 2020-10-15 NOTE — TOC Progression Note (Signed)
Transition of Care [facility]) - Progression Note    Patient Details  Name: Angela Mason MRN: [id number] Date of Birth: 1933/04/02  Transition of Care [facility]) CM/SW Contact  Beverly Sessions, RN Phone Number: 10/15/2020, 4:46 PM  Clinical Narrative:     [name] went to level 2.  [facility] requires additional clinical.  I have requested MD to sign fl2 and 30 day  Expected Discharge Plan: [place] Barriers to Discharge: Continued Medical Work up  Expected Discharge Plan and Services Expected Discharge Plan: [place] In-house Referral: Clinical Social Work     Living arrangements for the past 2 months: [place]: PT,OT [place]: [place] ([facility]) Date [place]: 10/07/20 Time [place]: 1706 Representative spoke with at [place]: [place] ([place]) Interventions    Readmission Risk Interventions No flowsheet data found.

## 2020-10-15 NOTE — NC FL2 (Signed)
[place] LEVEL OF CARE SCREENING TOOL     IDENTIFICATION  Patient Name: Angela Mason Birthdate: [birth year] Sex: female Admission Date (Current Location): 10/04/2020  [place] and [place] Number:  Engineering geologist and Address:  [facility], [address], [place], [place] [address]      Provider Number: [id number]  Attending Physician Name and Address:  Max Sane, MD  Relative Name and Phone Number:  Vergia Alberts [phone] (Daughter)    Current Level of Care: SNF Recommended Level of Care: [place] Prior Approval Number:    Date Approved/Denied:   PASRR Number:    Discharge Plan: SNF    Current Diagnoses: Patient Active Problem List   Diagnosis Date Noted  . Severe major depression, single episode, with psychotic features ([place]) 10/12/2020  . AMS (altered mental status) 10/05/2020  . Acute metabolic encephalopathy 56/21/3086  . UTI (urinary tract infection) 10/04/2020  . HLD (hyperlipidemia)   . HTN (hypertension)   . Hypothyroidism   . Hyponatremia   . Hypertensive urgency   . Encephalopathy   . Underweight 08/08/2020  . 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 08/04/2018  . Cystocele, midline 05/05/2017  . Nocturia 10/07/2016  . Unstable bladder 10/07/2016  . Vaginal atrophy 01/23/2016  . 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 Midline cystocele 11/01/2015    Orientation RESPIRATION BLADDER Height & Weight     Place,Self  Normal Incontinent,External catheter Weight: 36.7 kg Height:     BEHAVIORAL SYMPTOMS/MOOD NEUROLOGICAL BOWEL NUTRITION STATUS      Continent Diet (Regular)  AMBULATORY STATUS COMMUNICATION OF NEEDS Skin   Limited Assist Verbally Bruising                       Personal Care Assistance Level of Assistance  Bathing,Feeding,Dressing Bathing Assistance: Limited assistance Feeding assistance: Limited assistance Dressing Assistance: Limited assistance      Functional Limitations Info             SPECIAL CARE FACTORS FREQUENCY  OT (By licensed OT),PT (By licensed PT)                    Contractures Contractures Info: Not present    Additional Factors Info  Code Status,Allergies Code Status Info: Full Allergies Info: Gentamicin, Levofloxacin           Current Medications (10/15/2020):  This is the current hospital active medication list Current Facility-Administered Medications  Medication Dose Route Frequency Provider Last Rate Last Admin  . acetaminophen (TYLENOL) suppository 650 mg  650 mg Rectal Q6H PRN Ivor Costa, MD      . acetaminophen (TYLENOL) tablet 650 mg  650 mg Oral Q6H PRN Ivor Costa, MD      . albuterol (VENTOLIN HFA) 108 (90 Base) MCG/ACT inhaler 2 puff  2 puff Inhalation Q4H PRN Ivor Costa, MD      . ARIPiprazole (ABILIFY) tablet 1 mg  1 mg Oral QHS Clapacs, Madie Reno, MD   1 mg at 10/14/20 2313  . atenolol (TENORMIN) tablet 25 mg  25 mg Oral Daily Vann, Jessica U, DO   25 mg at 10/15/20 1028  . calcium-vitamin D (OSCAL WITH D) 500-200 MG-UNIT per tablet 1 tablet  1 tablet Oral BID Ivor Costa, MD   1 tablet at 10/14/20 2313  . enoxaparin (LOVENOX) injection 30 mg  30 mg Subcutaneous Q24H Ivor Costa, MD   30 mg at 10/14/20 2312  . folic acid (FOLVITE)  tablet 1 mg  1 mg Oral Daily Eulogio Bear U, DO   1 mg at 10/14/20 1108  . hydrALAZINE (APRESOLINE) injection 5 mg  5 mg Intravenous Q2H PRN Ivor Costa, MD      . melatonin tablet 5 mg  5 mg Oral QPM Val Riles, MD   5 mg at 10/14/20 1740  . mirtazapine (REMERON SOL-TAB) disintegrating tablet 15 mg  15 mg Oral QHS Clapacs, John T, MD   15 mg at 10/14/20 2315  . multivitamin with minerals tablet 1 tablet  1 tablet Oral Daily Eulogio Bear U, DO   1 tablet at 10/14/20 1108  . multivitamin-lutein (OCUVITE-LUTEIN) capsule 1 capsule  1 capsule Oral Daily Ivor Costa, MD   1 capsule at 10/14/20 1110  . naphazoline-glycerin (CLEAR EYES REDNESS) ophth solution 1 drop   1 drop Both Eyes QID PRN Ivor Costa, MD      . omega-3 acid ethyl esters (LOVAZA) capsule 1 g  1 g Oral Daily Ivor Costa, MD   1 g at 10/14/20 1108  . ondansetron (ZOFRAN) injection 4 mg  4 mg Intravenous Q8H PRN Ivor Costa, MD      . pravastatin (PRAVACHOL) tablet 20 mg  20 mg Oral q1800 Ivor Costa, MD   20 mg at 10/14/20 1740  . thiamine tablet 100 mg  100 mg Oral Daily Eulogio Bear U, DO   100 mg at 10/14/20 1109   Or  . thiamine (B-1) injection 100 mg  100 mg Intravenous Daily Vann, Jessica U, DO      . Vitamin D (Ergocalciferol) (DRISDOL) capsule 50,000 Units  50,000 Units Oral Q7 days Val Riles, MD   50,000 Units at 10/11/20 1040     Discharge Medications: Please see discharge summary for a list of discharge medications.  Relevant Imaging Results:  Relevant Lab Results:   Additional Information ss [id number]  Beverly Sessions, RN

## 2020-10-15 NOTE — TOC Progression Note (Addendum)
Transition of Care [facility]) - Progression Note    Patient Details  Name: Jaqulyn Chancellor MRN: [id number] Date of Birth: 03/18/1933  Transition of Care [facility]) CM/SW Contact  Beverly Sessions, RN Phone Number: 10/15/2020, 3:25 PM  Clinical Narrative:     Daughter at bedside.  She states that she talked to [facility] this morning and they are agreeable for patient to come to skilled side  TOC followed up with Seth Bake.  She is aware of therapy recommendations and confirms that patient will be coming to skilled.  She request that daughter transport at discharge.   Fl2 sent for signature PASRR went to level 2.  Will upload clinical to [place]   Expected Discharge Plan: [place] Barriers to Discharge: Continued Medical Work up  Expected Discharge Plan and Services Expected Discharge Plan: [place] In-house Referral: Clinical Social Work     Living arrangements for the past 2 months: [place]: PT,OT [employer]: [place] ([facility]) Date [place]: 10/07/20 Time [place]: 1706 Representative spoke with at [place]: [place] ([place]) Interventions    Readmission Risk Interventions No flowsheet data found.

## 2020-10-15 NOTE — Care Management (Addendum)
RE: Angela Mason Date of Birth: [birth year] Date: 10/15/20   To Whom It May Concern:  Please be advised that the above-named patient will require a short-term nursing home stay - anticipated 30 days or less for rehabilitation and strengthening.  The plan is for return home.

## 2020-10-15 NOTE — Progress Notes (Signed)
Physical Therapy Treatment Patient Details Name: Angela Mason MRN: [id number] DOB: 1933-02-22 Today's Date: 10/15/2020    History of Present Illness 87 y.o. female with medical history significant of hypertension, hyperlipidemia, COPD, hypothyroidism, cystocele, who presents with altered mental status.   Per report, and her normal baseline, patient is independent, normally alert, oriented, and can drive car. Pt was noted to be confused in the past severe days, with paranoia. She is not acting like herself.    She has poor appetite and decreased oral intake. She was diagnosed with UTI on 3/7 and started on cefuroxime.    PT Comments    Pt seen for skilled PT treatment with daughter Magda Paganini present throughout session. Prior to admission pt was independent with mobility without AD, driving, living with husband in [place], but he is currently in [place] 2/2 multiple medical issues over the past few months. Pt is pleasant & oriented but does demonstrate cognitive deficits re: safety & awareness with mobility & this PT recommends 24 hr supervision upon d/c.   Pt is able to complete sit<>stand transfers from recliner with supervision, min assist from low toilet with cuing for safe hand placement to push to standing vs attempting to stabilize on RW. While toileting pt is able to perform peri hygiene without assistance and standing at sink to complete hand hygiene with CGA. Pt is able to ambulate 200 ft with RW with supervision with decreased dorsiflexion & heel strike BLE, and slight shuffled gait. Pt then ambulates 20 ft without AD & CGA<>min assist with cuing not to hold to furniture & more narrow BOS.   Pt's daughter Magda Paganini) is visiting from [place], and splitting time between visiting pt & her father who is in [place]. Magda Paganini reports she is unable to provide 24 hr assistance as she may have to return home to [place] within the week, before coming back to visit for the Easter holiday.  Pt will require 24 hr  supervision upon d/c.     Follow Up Recommendations  Home health PT;Supervision/Assistance - 24 hour;Supervision for mobility/OOB     Equipment Recommendations  Rolling walker with 5" wheels (youth RW)    Recommendations for Other Services       Precautions / Restrictions Precautions Precautions: Fall Restrictions Weight Bearing Restrictions: No    Mobility  Bed Mobility Overal bed mobility: Needs Assistance Bed Mobility: Supine to Sit     Supine to sit: Supervision;HOB elevated     General bed mobility comments: not observed as pt received & left sitting in recliner    Transfers Overall transfer level: Needs assistance Equipment used: Rolling walker (2 wheeled) Transfers: Sit to/from Stand Sit to Stand: Supervision;Min assist         General transfer comment: multimodal cuing for hand placement to push up on armrests when sit>stand from recliner, min assist sit>stand from low toilet with cuing for use of grab bar  Ambulation/Gait Ambulation/Gait assistance: Supervision;Min guard;Min assist Gait Distance (Feet):  (200 ft RW with supervision + 20 ft without AD & CGA) Assistive device: Rolling walker (2 wheeled);None Gait Pattern/deviations: Decreased step length - right;Decreased step length - left;Decreased stride length;Decreased dorsiflexion - right;Decreased dorsiflexion - left;Shuffle Gait velocity: decreased; Pt is able to ambulate 200 ft with RW & supervision, 20 ft without AD & CGA<>min assist with pt demonstrating more narrow BOS when ambulating without AD & initially attempts to hold to things for support but PT educated her not to.   General Gait Details: decreased foot clearance BLE  Stairs             Wheelchair Mobility    Modified Rankin (Stroke Patients Only)       Balance Overall balance assessment: Needs assistance Sitting-balance support: Feet supported;No upper extremity supported Sitting balance-Leahy Scale: Fair Sitting  balance - Comments: supervision sitting in recliner   Standing balance support: No upper extremity supported;During functional activity Standing balance-Leahy Scale: Fair Standing balance comment: Pt able to stand at sink to wash hands with CGA for balance, no LOB noted during task                            Cognition Arousal/Alertness: Awake/alert Behavior During Therapy: WFL for tasks assessed/performed Overall Cognitive Status: Impaired/Different from baseline Area of Impairment: Problem solving;Attention;Memory;Following commands;Awareness;Safety/judgement;Orientation                 Orientation Level:  (AxO x 4)   Memory: Decreased short-term memory Following Commands: Follows one step commands consistently;Follows one step commands with increased time Safety/Judgement: Decreased awareness of deficits;Decreased awareness of safety Awareness: Anticipatory;Emergent Problem Solving: Slow processing;Decreased initiation;Requires verbal cues;Requires tactile cues General Comments: Pleasant pt but decreased awareness re: safety & current deficits      Exercises     General Comments General comments (skin integrity, edema, etc.): Pt ambulates into bathroom & completes toilet transfer & peri hygiene without assistance.      Pertinent Vitals/Pain Pain Assessment: Faces Faces Pain Scale: No hurt    Home Living Family/patient expects to be discharged to:: Private residence Living Arrangements: Spouse/significant other Available Help at Discharge: Family;Other (Comment) (daughter present for short period of time, lives in [place], [place], and unable to provide 24/7) Type of Home: Independent living facility Home Access: Level entry   Home Layout: One level Home Equipment: Bedside commode;Shower seat Additional Comments: Pt reports her husband is currently in rehab after a fall.    Prior Function Level of Independence: Independent      Comments: per pt  report, she drives, performs ADLs, and performs IADLs without AD. Per daughter present, pt has been caregiving for the pt's spouse more recently and this has had a significant impact on the pt's overall well-being   PT Goals (current goals can now be found in the care plan section) Acute Rehab PT Goals Patient Stated Goal: to go to rehab PT Goal Formulation: With family Time For Goal Achievement: 10/19/20 Potential to Achieve Goals: Fair Progress towards PT goals: Progressing toward goals    Frequency    Min 2X/week      PT Plan Current plan remains appropriate    Co-evaluation              AM-PAC PT "6 Clicks" Mobility   Outcome Measure  Help needed turning from your back to your side while in a flat bed without using bedrails?: None Help needed moving from lying on your back to sitting on the side of a flat bed without using bedrails?: None Help needed moving to and from a bed to a chair (including a wheelchair)?: A Little Help needed standing up from a chair using your arms (e.g., wheelchair or bedside chair)?: None Help needed to walk in hospital room?: A Little Help needed climbing 3-5 steps with a railing? : A Lot 6 Click Score: 20    End of Session Equipment Utilized During Treatment: Gait belt Activity Tolerance: Patient tolerated treatment well Patient left: in chair;with call bell/phone within reach;with  chair alarm set;with family/visitor present Nurse Communication: Mobility status PT Visit Diagnosis: Unsteadiness on feet (R26.81);Muscle weakness (generalized) (M62.81)     Time: 0263-7858 PT Time Calculation (min) (ACUTE ONLY): 42 min  Charges:  $Therapeutic Activity: 38-52 mins                     Lavone Nian, PT, DPT 10/15/20, 12:33 PM    Waunita Schooner 10/15/2020, 11:41 AM

## 2020-10-15 NOTE — Progress Notes (Addendum)
PROGRESS NOTE    Angela Mason   EVO:[id number]  DOB: 1933/06/14  PCP: Albina Billet, MD    DOA: 10/04/2020 LOS: Chelan Dicktenis an 87 y.o.femalewith medical history significant ofhypertension, hyperlipidemia, COPD, hypothyroidism, cystocele, who presents with altered mental status.  Per report, and her normal baseline, patient is independent,alert, oriented, and candrive car. Pt was noted to be confused in the past several days, withparanoia, not acting like herself.She has poor appetite and decreased oral intake. She was diagnosed with UTI on 3/7 and started on cefuroxime, but was not clinically improving.    Assessment & Plan   Principal Problem:   Severe major depression, single episode, with psychotic features ([place]) Active Problems:   Acute metabolic encephalopathy   HLD (hyperlipidemia)   HTN (hypertension)   Hypothyroidism   Hyponatremia   Hypertensive urgency   UTI (urinary tract infection)   AMS (altered mental status)   Acute metabolic encephalopathy - Resolved. Severe depression with psychotic features Likely was multifactorial etiology, including hypertensive urgency, hyponatremia, UTI, ?PRES syndrome. CT head negative for acute intracranial abnormalities.  No focal neuro deficit on physical examination. -continues to be oriented to person/place/time but more paranoid and mis-trusting - psych feels is severe depressive episode with psychotic features --Treat underlying issues as below -? PRES as BP was high on arrival, but MRI negative --Neurology consult appreciated- no fever, s/p IV acyclovir due to HSV suspicion which was discontinued as per neurology, no need of LP. MRI brain ruled out any acute events. EEG mild diffuse encephalopathy, no seizures or epileptiform discharges. --Psychiatry consult appreciate recommendations --increase Remeron to 30 mg and Abilify 2 mg daily per psych   Generalized Weakness / Physical  Debility - OT re-evaluations recommends SNF, PT - HHPT, 24/7 supervision  UTI - POA Patient was previously started onon cefuroxime3/7/22, urinalysis negative here.  Seems unlikely contributing to current acute illness.   Has completed of 5 days (including outpt abx).  Hyperlipidemia - continue Pravastatin  Hx of HTN with Hypertensive Urgency - BP was 207/92, 182/91 upon admission.  Likely due to medication non-compliance. -continue homeAtenololbut change to daily-- HR in the 60s so not able to increase atenolol -much improved- appear to take her BP meds occasionally even in the hospital  H/o Hypothyroidism not taking meds now.  TSH normal.  Hyponatremia - Resolved.  Na was 124 upon admission, most likely due to topoor oral intake and dehydration vs SIADH. TSH, cortisol normal. --Continue 1500 cc/day fluid restriction  --Regular diet, no sodium restriction --Given salt tablet 1 g PO TID x 2 days --Monitor labs daily. Na 133 today  Alcohol use - ruled out. D/w daughter.  Underweight -  Body mass index is 16.6 kg/m. Dietician consulted.  Continue supplemental protein/nutrition drinks. Unrestricted diet.  Started on mirtazapine by psychiatry, should help appetite.  DVT prophylaxis: enoxaparin (LOVENOX) injection 30 mg Start: 10/04/20 2200   Diet:  Diet Orders (From admission, onward)    Start     Ordered   10/10/20 1321  Diet regular Room service appropriate? Yes; Fluid consistency: Thin; Fluid restriction: 1500 mL Fluid  Diet effective now       Question Answer Comment  Room service appropriate? Yes   Fluid consistency: Thin   Fluid restriction: 1500 mL Fluid      10/10/20 1320            Code Status: Full Code    Subjective 10/15/20    Easily confused,  paranoia +, daughter at bedside Disposition Plan & Communication   Status is: Inpatient  Inpatient status remains appropriate due to severity of illness, therapy evaluations for safest disposition.  New  psych medications requiring close monitoring and psychiatry follow-up on Monday.  Dispo: The patient is from: ALF              Anticipated d/c is to: [facility] on [facility]              Patient currently is not medically stable for discharge   Difficult to place patient no   Family Communication: Daughter at bedside arouse today   Consults, Procedures, Significant Events   Consultants:   Psychiatry  Neurology  Procedures:   None  Antimicrobials:  Anti-infectives (From admission, onward)   Start     Dose/Rate Route Frequency Ordered Stop   10/11/20 2200  acyclovir (ZOVIRAX) 365 mg in dextrose 5 % 100 mL IVPB  Status:  Discontinued        10 mg/kg  36.7 kg 107.3 mL/hr over 60 Minutes Intravenous Every 24 hours 10/11/20 0832 10/11/20 1603   10/08/20 1100  acyclovir (ZOVIRAX) 365 mg in dextrose 5 % 100 mL IVPB  Status:  Discontinued        10 mg/kg  36.7 kg 107.3 mL/hr over 60 Minutes Intravenous Every 12 hours 10/08/20 0938 10/11/20 0832   10/07/20 1300  acyclovir (ZOVIRAX) 365 mg in dextrose 5 % 100 mL IVPB  Status:  Discontinued        10 mg/kg  36.7 kg 107.3 mL/hr over 60 Minutes Intravenous Every 24 hours 10/07/20 1136 10/07/20 1136   10/07/20 1300  acyclovir (ZOVIRAX) 365 mg in dextrose 5 % 100 mL IVPB  Status:  Discontinued        10 mg/kg  36.7 kg 107.3 mL/hr over 60 Minutes Intravenous Every 24 hours 10/07/20 1137 10/08/20 0938   10/04/20 1630  cefTRIAXone (ROCEPHIN) 1 g in sodium chloride 0.9 % 100 mL IVPB        1 g 200 mL/hr over 30 Minutes Intravenous Every 24 hours 10/04/20 1622 10/06/20 1827        Micro    Objective   Vitals:   10/14/20 2321 10/15/20 0341 10/15/20 0756 10/15/20 2023  BP: 136/68 (!) 146/62 (!) 144/56 114/62  Pulse: 64 62 67 75  Resp: 20 16 18 16   Temp: 97.9 F (36.6 C) 98.2 F (36.8 C) 98.2 F (36.8 C) 98.2 F (36.8 C)  TempSrc: Oral  Oral Oral  SpO2: 97% 99% 98% 99%  Weight:        Intake/Output Summary (Last 24  hours) at 10/15/2020 2053 Last data filed at 10/15/2020 1837 Gross per 24 hour  Intake 300 ml  Output 550 ml  Net -250 ml   Filed Weights   10/04/20 1640  Weight: 36.7 kg    Physical Exam:  General exam: awake, alert, no acute distress, underweight, frail Respiratory system: CTAB, no wheezes, rales or rhonchi, normal respiratory effort. Cardiovascular system: normal S1/S2, RRR Central nervous system: no gross focal neurologic deficits, normal speech Extremities: moves all, no edema, normal tone Psychiatry: normal mood, congruent affect, expresses paranoid thoughts, abnormal judgment and insight  Labs   Data Reviewed: I have personally reviewed following labs and imaging studies  CBC: Recent Labs  Lab 10/11/20 0425 10/12/20 0425 10/13/20 0431 10/14/20 0449 10/15/20 0612  WBC 8.4 6.6 8.6 6.8 6.7  HGB 11.6* 10.9* 12.8 10.9* 11.3*  HCT 34.1*  32.9* 38.0 33.0* 34.4*  MCV 95.0 96.5 96.7 97.1 97.7  PLT 239 225 245 242 595   Basic Metabolic Panel: Recent Labs  Lab 10/11/20 0425 10/12/20 0425 10/13/20 0431 10/14/20 0449 10/15/20 0612  NA 133* 137 136 132* 133*  K 4.1 4.0 4.2 4.0 4.3  CL 102 104 97* 95* 97*  CO2 26 26 33* 32 32  GLUCOSE 88 83 106* 87 99  BUN 27* 21 16 19 21   CREATININE 1.00 0.71 0.63 0.60 0.65  CALCIUM 8.6* 9.1 9.5 9.1 9.0  MG 2.0 2.0 1.8  --   --   PHOS 3.9 4.2  --   --   --    GFR: Estimated Creatinine Clearance: 29.8 mL/min (by C-G formula based on SCr of 0.65 mg/dL). Liver Function Tests: No results for input(s): AST, ALT, ALKPHOS, BILITOT, PROT, ALBUMIN in the last 168 hours. No results for input(s): LIPASE, AMYLASE in the last 168 hours. No results for input(s): AMMONIA in the last 168 hours. Coagulation Profile: No results for input(s): INR, PROTIME in the last 168 hours. Cardiac Enzymes: No results for input(s): CKTOTAL, CKMB, CKMBINDEX, TROPONINI in the last 168 hours. BNP (last 3 results) No results for input(s): PROBNP in the last  8760 hours. HbA1C: No results for input(s): HGBA1C in the last 72 hours. CBG: Recent Labs  Lab 10/10/20 0746 10/12/20 0738 10/13/20 0738 10/15/20 0749  GLUCAP 106* 105* 93 89   Lipid Profile: No results for input(s): CHOL, HDL, LDLCALC, TRIG, CHOLHDL, LDLDIRECT in the last 72 hours. Thyroid Function Tests: No results for input(s): TSH, T4TOTAL, FREET4, T3FREE, THYROIDAB in the last 72 hours. Anemia Panel: No results for input(s): VITAMINB12, FOLATE, FERRITIN, TIBC, IRON, RETICCTPCT in the last 72 hours. Sepsis Labs: No results for input(s): PROCALCITON, LATICACIDVEN in the last 168 hours.  Recent Results (from the past 240 hour(s))  SARS CORONAVIRUS 2 (TAT 6-24 HRS) Nasopharyngeal Nasopharyngeal Swab     Status: None   Collection Time: 10/14/20  5:55 PM   Specimen: Nasopharyngeal Swab  Result Value Ref Range Status   SARS Coronavirus 2 NEGATIVE NEGATIVE Final    Comment: (NOTE) SARS-CoV-2 target nucleic acids are NOT DETECTED.  The SARS-CoV-2 RNA is generally detectable in upper and lower respiratory specimens during the acute phase of infection. Negative results do not preclude SARS-CoV-2 infection, do not rule out co-infections with other pathogens, and should not be used as the sole basis for treatment or other patient management decisions. Negative results must be combined with clinical observations, patient history, and epidemiological information. The expected result is Negative.  Fact Sheet for Patients: SugarRoll.be  Fact Sheet for Healthcare Providers: https://www.woods-mathews.com/  This test is not yet approved or cleared by the [place] FDA and  has been authorized for detection and/or diagnosis of SARS-CoV-2 by FDA under an Emergency Use Authorization (EUA). This EUA will remain  in effect (meaning this test can be used) for the duration of the COVID-19 declaration under Se ction 564(b)(1) of the Act, 21  U.S.C. section 360bbb-3(b)(1), unless the authorization is terminated or revoked sooner.  Performed at [facility], [place] [address] [place], [place] [address]       Imaging Studies   No results found.   Medications   Scheduled Meds: . ARIPiprazole  2 mg Oral QHS  . atenolol  25 mg Oral Daily  . calcium-vitamin D  1 tablet Oral BID  . enoxaparin (LOVENOX) injection  30 mg Subcutaneous Q24H  . folic  acid  1 mg Oral Daily  . melatonin  5 mg Oral QPM  . mirtazapine  30 mg Oral QHS  . multivitamin with minerals  1 tablet Oral Daily  . multivitamin-lutein  1 capsule Oral Daily  . omega-3 acid ethyl esters  1 g Oral Daily  . pravastatin  20 mg Oral q1800  . thiamine  100 mg Oral Daily   Or  . thiamine  100 mg Intravenous Daily  . Vitamin D (Ergocalciferol)  50,000 Units Oral Q7 days   Continuous Infusions:     LOS: 10 days    Time spent: 35 minutes with > 50% spent at bedside and in coordination of care    Max Sane, MD [facility]  10/15/2020, 8:53 PM      If 7PM-7AM, please contact night-coverage. How to contact the [facility] Attending or Consulting provider [place] or covering provider during after hours [place], for this patient?    1. Check the care team in [facility] and look for a) attending/consulting [facility] provider listed and b) the [facility] team listed 2. Log into [email or web address] and use [facility] universal password to access. If you do not have the password, please contact the hospital operator. 3. Locate the [facility] provider you are looking for under [facility] and page to a number that you can be directly reached. 4. If you still have difficulty reaching the provider, please page the [facility] (Director on Call) for the Hospitalists listed on amion for assistance.

## 2020-10-15 NOTE — Consult Note (Signed)
[facility] Face-to-Face Psychiatry Consult   Reason for Consult: Consult follow-up 87 year old woman with multiple medical problems and depression Referring Physician:  Manuella Ghazi Patient Identification: Angela Mason MRN:  [id number] Principal Diagnosis: Severe major depression, single episode, with psychotic features ([place]) Diagnosis:  Principal Problem:   Severe major depression, single episode, with psychotic features ([place]) Active Problems:   Acute metabolic encephalopathy   HLD (hyperlipidemia)   HTN (hypertension)   Hypothyroidism   Hyponatremia   Hypertensive urgency   UTI (urinary tract infection)   AMS (altered mental status)   Total Time spent with patient: 1 hour  Subjective:   Angela Mason is a 87 y.o. female patient admitted with "well I guess I am still not doing well".  HPI: Patient seen chart reviewed also spoke with the patient's daughter again.  Patient says she is still feeling bad but actually looks a little better.  More alert I think and more interactive.  Seems to be eating better.  Denies suicidal thoughts but still has some paranoid ideas.  Hears the noise coming from the nursing station and is convinced that they are talking about her and her daughter.  Patient is still easily confused and gets a little paranoid.  She has been taking medicine and does not have any notable side effects that we can identify.  Past Psychiatric History: Past history of depression and anxiety  Risk to Self:   Risk to Others:   Prior Inpatient Therapy:   Prior Outpatient Therapy:    Past Medical History:  Past Medical History:  Diagnosis Date  . Cancer ([place])    SKIN  . COPD, mild ([place])   . Dyspnea   . Edema   . HLD (hyperlipidemia)   . HTN (hypertension)   . Hypothyroidism   . Macular degeneration, bilateral   . Varicosities     Past Surgical History:  Procedure Laterality Date  . CATARACT EXTRACTION W/PHACO Right 02/03/2017   Procedure: CATARACT EXTRACTION PHACO AND  INTRAOCULAR LENS PLACEMENT (IOC);  Surgeon: Birder Robson, MD;  Location: [facility] ORS;  Service: Ophthalmology;  Laterality: Right;  [place] 00:57.2 AP% 18.5 CDE 10.58 Fluid pack lot # [id number]  . CATARACT EXTRACTION W/PHACO Left 02/24/2017   Procedure: CATARACT EXTRACTION PHACO AND INTRAOCULAR LENS PLACEMENT (IOC);  Surgeon: Birder Robson, MD;  Location: [facility] ORS;  Service: Ophthalmology;  Laterality: Left;  [place] 01:12 AP% 25.0 CDE 18.03 Fluid pack lot # [id number]  . MOHS SURGERY  2015   nose  . SALIVARY GLAND SURGERY Left 2012  . TONSILLECTOMY  1940   Family History:  Family History  Problem Relation Age of Onset  . COPD Father   . Heart failure Father   . Diverticulitis Mother   . Macular degeneration Mother   . Epilepsy Sister   . Parkinson's disease Brother   . Cancer Neg Hx   . Diabetes Neg Hx   . Breast cancer Neg Hx    Family Psychiatric  History: See previous Social History:  Social History   Substance and Sexual Activity  Alcohol Use Yes  . Alcohol/week: 0.0 standard drinks   Comment: 1 qd     Social History   Substance and Sexual Activity  Drug Use No    Social History   Socioeconomic History  . Marital status: Married    Spouse name: Not on file  . Number of children: Not on file  . Years of education: Not on file  . Highest education level: Not on file  Occupational History  .  Not on file  Tobacco Use  . Smoking status: Former Smoker    Quit date: 09/05/2005    Years since quitting: 15.1  . Smokeless tobacco: Never Used  Vaping Use  . Vaping Use: Never used  Substance and Sexual Activity  . Alcohol use: Yes    Alcohol/week: 0.0 standard drinks    Comment: 1 qd  . Drug use: No  . Sexual activity: Not Currently    Birth control/protection: Post-menopausal  Other Topics Concern  . Not on file  Social History Narrative  . Not on file   Social Determinants of Health   Financial Resource Strain: Not on file  Food Insecurity: Not on file   Transportation Needs: Not on file  Physical Activity: Not on file  Stress: Not on file  Social Connections: Not on file   Additional Social History:    Allergies:   Allergies  Allergen Reactions  . Gentamicin   . Levofloxacin Diarrhea    Labs:  Results for orders placed or performed during the hospital encounter of 10/04/20 (from the past 48 hour(s))  Basic metabolic panel     Status: Abnormal   Collection Time: 10/14/20  4:49 AM  Result Value Ref Range   Sodium 132 (L) 135 - 145 mmol/L   Potassium 4.0 3.5 - 5.1 mmol/L   Chloride 95 (L) 98 - 111 mmol/L   CO2 32 22 - 32 mmol/L   Glucose, Bld 87 70 - 99 mg/dL    Comment: Glucose reference range applies only to samples taken after fasting for at least 8 hours.   BUN 19 8 - 23 mg/dL   Creatinine, Ser 0.60 0.44 - 1.00 mg/dL   Calcium 9.1 8.9 - 10.3 mg/dL   GFR, Estimated >60 >60 mL/min    Comment: (NOTE) Calculated using the CKD-EPI Creatinine Equation (2021)    Anion gap 5 5 - 15    Comment: Performed at [facility], [address] [place], [place] [address]  CBC     Status: Abnormal   Collection Time: 10/14/20  4:49 AM  Result Value Ref Range   WBC 6.8 4.0 - 10.5 K/uL   RBC 3.40 (L) 3.87 - 5.11 MIL/uL   Hemoglobin 10.9 (L) 12.0 - 15.0 g/dL   HCT 33.0 (L) 36.0 - 46.0 %   MCV 97.1 80.0 - 100.0 fL   MCH 32.1 26.0 - 34.0 pg   MCHC 33.0 30.0 - 36.0 g/dL   RDW 12.2 11.5 - 15.5 %   Platelets 242 150 - 400 K/uL   nRBC 0.0 0.0 - 0.2 %    Comment: Performed at [facility], [address] [place], [place] [address]  SARS CORONAVIRUS 2 (TAT 6-24 HRS) Nasopharyngeal Nasopharyngeal Swab     Status: None   Collection Time: 10/14/20  5:55 PM   Specimen: Nasopharyngeal Swab  Result Value Ref Range   SARS Coronavirus 2 NEGATIVE NEGATIVE    Comment: (NOTE) SARS-CoV-2 target nucleic acids are NOT DETECTED.  The SARS-CoV-2 RNA is generally detectable in upper and lower respiratory specimens during the acute  phase of infection. Negative results do not preclude SARS-CoV-2 infection, do not rule out co-infections with other pathogens, and should not be used as the sole basis for treatment or other patient management decisions. Negative results must be combined with clinical observations, patient history, and epidemiological information. The expected result is Negative.  Fact Sheet for Patients: SugarRoll.be  Fact Sheet for Healthcare Providers: https://www.woods-mathews.com/  This test is  not yet approved or cleared by the [place] and  has been authorized for detection and/or diagnosis of SARS-CoV-2 by FDA under an Emergency Use Authorization (EUA). This EUA will remain  in effect (meaning this test can be used) for the duration of the COVID-19 declaration under Se ction 564(b)(1) of the Act, 21 U.S.C. section 360bbb-3(b)(1), unless the authorization is terminated or revoked sooner.  Performed at [facility], [place] [address] [place], [place] [address]   Basic metabolic panel     Status: Abnormal   Collection Time: 10/15/20  6:12 AM  Result Value Ref Range   Sodium 133 (L) 135 - 145 mmol/L   Potassium 4.3 3.5 - 5.1 mmol/L   Chloride 97 (L) 98 - 111 mmol/L   CO2 32 22 - 32 mmol/L   Glucose, Bld 99 70 - 99 mg/dL    Comment: Glucose reference range applies only to samples taken after fasting for at least 8 hours.   BUN 21 8 - 23 mg/dL   Creatinine, Ser 0.65 0.44 - 1.00 mg/dL   Calcium 9.0 8.9 - 10.3 mg/dL   GFR, Estimated >60 >60 mL/min    Comment: (NOTE) Calculated using the CKD-EPI Creatinine Equation (2021)    Anion gap 4 (L) 5 - 15    Comment: Performed at [facility], [address] [place], [place] [address]  CBC     Status: Abnormal   Collection Time: 10/15/20  6:12 AM  Result Value Ref Range   WBC 6.7 4.0 - 10.5 K/uL   RBC 3.52 (L) 3.87 - 5.11 MIL/uL   Hemoglobin 11.3 (L) 12.0 - 15.0 g/dL   HCT 34.4  (L) 36.0 - 46.0 %   MCV 97.7 80.0 - 100.0 fL   MCH 32.1 26.0 - 34.0 pg   MCHC 32.8 30.0 - 36.0 g/dL   RDW 12.3 11.5 - 15.5 %   Platelets 231 150 - 400 K/uL   nRBC 0.0 0.0 - 0.2 %    Comment: Performed at [facility], [address] [place], [place] [address]  Glucose, capillary     Status: None   Collection Time: 10/15/20  7:49 AM  Result Value Ref Range   Glucose-Capillary 89 70 - 99 mg/dL    Comment: Glucose reference range applies only to samples taken after fasting for at least 8 hours.    Current Facility-Administered Medications  Medication Dose Route Frequency Provider Last Rate Last Admin  . acetaminophen (TYLENOL) suppository 650 mg  650 mg Rectal Q6H PRN Ivor Costa, MD      . acetaminophen (TYLENOL) tablet 650 mg  650 mg Oral Q6H PRN Ivor Costa, MD      . albuterol (VENTOLIN HFA) 108 (90 Base) MCG/ACT inhaler 2 puff  2 puff Inhalation Q4H PRN Ivor Costa, MD      . ARIPiprazole (ABILIFY) tablet 2 mg  2 mg Oral QHS Clapacs, John T, MD      . atenolol (TENORMIN) tablet 25 mg  25 mg Oral Daily Vann, Jessica U, DO   25 mg at 10/15/20 1028  . calcium-vitamin D (OSCAL WITH D) 500-200 MG-UNIT per tablet 1 tablet  1 tablet Oral BID Ivor Costa, MD   1 tablet at 10/14/20 2313  . enoxaparin (LOVENOX) injection 30 mg  30 mg Subcutaneous Q24H Ivor Costa, MD   30 mg at 10/14/20 2312  . folic acid (FOLVITE) tablet 1 mg  1 mg Oral Daily Vann, Jessica U, DO  1 mg at 10/14/20 1108  . hydrALAZINE (APRESOLINE) injection 5 mg  5 mg Intravenous Q2H PRN Ivor Costa, MD      . melatonin tablet 5 mg  5 mg Oral QPM Val Riles, MD   5 mg at 10/15/20 1824  . mirtazapine (REMERON SOL-TAB) disintegrating tablet 30 mg  30 mg Oral QHS Clapacs, John T, MD      . multivitamin with minerals tablet 1 tablet  1 tablet Oral Daily Eulogio Bear U, DO   1 tablet at 10/14/20 1108  . multivitamin-lutein (OCUVITE-LUTEIN) capsule 1 capsule  1 capsule Oral Daily Ivor Costa, MD   1 capsule at 10/14/20 1110   . naphazoline-glycerin (CLEAR EYES REDNESS) ophth solution 1 drop  1 drop Both Eyes QID PRN Ivor Costa, MD      . omega-3 acid ethyl esters (LOVAZA) capsule 1 g  1 g Oral Daily Ivor Costa, MD   1 g at 10/14/20 1108  . ondansetron (ZOFRAN) injection 4 mg  4 mg Intravenous Q8H PRN Ivor Costa, MD      . pravastatin (PRAVACHOL) tablet 20 mg  20 mg Oral q1800 Ivor Costa, MD   20 mg at 10/15/20 1824  . thiamine tablet 100 mg  100 mg Oral Daily Eulogio Bear U, DO   100 mg at 10/14/20 1109   Or  . thiamine (B-1) injection 100 mg  100 mg Intravenous Daily Vann, Jessica U, DO      . Vitamin D (Ergocalciferol) (DRISDOL) capsule 50,000 Units  50,000 Units Oral Q7 days Val Riles, MD   50,000 Units at 10/11/20 1040    Musculoskeletal: Strength & Muscle Tone: decreased Gait & Station: unsteady Patient leans: N/A            Psychiatric Specialty Exam:  Presentation  General Appearance: No data recorded Eye Contact:No data recorded Speech:No data recorded Speech Volume:No data recorded Handedness:No data recorded  Mood and Affect  Mood:No data recorded Affect:No data recorded  Thought Process  Thought Processes:No data recorded Descriptions of Associations:No data recorded Orientation:No data recorded Thought Content:No data recorded History of Schizophrenia/Schizoaffective disorder:No data recorded Duration of Psychotic Symptoms:No data recorded Hallucinations:No data recorded Ideas of Reference:No data recorded Suicidal Thoughts:No data recorded Homicidal Thoughts:No data recorded  Sensorium  Memory:No data recorded Judgment:No data recorded Insight:No data recorded  Executive Functions  Concentration:No data recorded Attention Span:No data recorded Recall:No data recorded Fund of Knowledge:No data recorded Language:No data recorded  Psychomotor Activity  Psychomotor Activity:No data recorded  Assets  Assets:No data recorded  Sleep  Sleep:No data  recorded  Physical Exam: Physical Exam Vitals and nursing note reviewed.  HENT:     Head: Normocephalic and atraumatic.     Mouth/Throat:     Pharynx: Oropharynx is clear.  Eyes:     Pupils: Pupils are equal, round, and reactive to light.  Cardiovascular:     Rate and Rhythm: Normal rate and regular rhythm.  Pulmonary:     Effort: Pulmonary effort is normal.     Breath sounds: Normal breath sounds.  Abdominal:     General: Abdomen is flat.     Palpations: Abdomen is soft.  Musculoskeletal:        General: Normal range of motion.  Skin:    General: Skin is warm and dry.  Neurological:     General: No focal deficit present.     Mental Status: She is alert. Mental status is at baseline.  Psychiatric:  Attention and Perception: She is inattentive.        Mood and Affect: Mood is anxious and depressed.        Speech: Speech is tangential.        Thought Content: Thought content is paranoid. Thought content does not include homicidal or suicidal ideation.        Cognition and Memory: Cognition is impaired.        Judgment: Judgment is impulsive.    Review of Systems  Constitutional: Negative.   HENT: Negative.   Eyes: Negative.   Respiratory: Negative.   Cardiovascular: Negative.   Gastrointestinal: Negative.   Musculoskeletal: Negative.   Skin: Negative.   Neurological: Negative.   Psychiatric/Behavioral: Positive for depression. Negative for suicidal ideas.   Blood pressure (!) 144/56, pulse 67, temperature 98.2 F (36.8 C), temperature source Oral, resp. rate 18, weight 36.7 kg, SpO2 98 %. Body mass index is 16.6 kg/m.  Treatment Plan Summary: Medication management and Plan 87 year old woman looks slightly better than she did the other day but still has multiple symptoms of depression with some psychotic features to it.  I suggested increasing the dose of both her antidepressant and antipsychotic going up to 30 mg of mirtazapine and 2 mg of Abilify.  Patient is  likely to be discharged tomorrow.  Reviewed with daughter and patient the importance of follow-up outpatient treatment once she gets out of the rehab where she is going.  Case reviewed with primary treatment team.  Does not require inpatient psychiatric hospitalization.  Disposition: No evidence of imminent risk to self or others at present.   Patient does not meet criteria for psychiatric inpatient admission. Supportive therapy provided about ongoing stressors. Discussed crisis plan, support from social network, calling 911, coming to the Emergency Department, and calling Suicide Hotline.  Alethia Berthold, MD 10/15/2020 8:09 PM

## 2020-10-16 DIAGNOSIS — H3530 Unspecified macular degeneration: Secondary | ICD-10-CM | POA: Insufficient documentation

## 2020-10-16 LAB — GLUCOSE, CAPILLARY: Glucose-Capillary: 89 mg/dL (ref 70–99)

## 2020-10-16 MED ORDER — ATENOLOL 25 MG PO TABS: 25.0000 mg | ORAL_TABLET | Freq: Every day | ORAL | Status: DC

## 2020-10-16 MED ORDER — MELATONIN 5 MG PO TABS: 5.0000 mg | ORAL_TABLET | Freq: Every evening | ORAL | 0 refills | Status: DC

## 2020-10-16 MED ORDER — MIRTAZAPINE 30 MG PO TBDP: 30.0000 mg | ORAL_TABLET | Freq: Every day | ORAL | Status: DC

## 2020-10-16 MED ORDER — ARIPIPRAZOLE 2 MG PO TABS: 2.0000 mg | ORAL_TABLET | Freq: Every day | ORAL | Status: DC

## 2020-10-16 NOTE — Care Management Important Message (Signed)
Important Message  Patient Details  Name: Angela Mason MRN: [id number] Date of Birth: 04-26-1933   Medicare Important Message Given:  Yes     [name] 10/16/2020, 10:57 AM

## 2020-10-16 NOTE — TOC Transition Note (Addendum)
Transition of Care [facility]) - CM/SW Discharge Note   Patient Details  Name: Angela Mason MRN: [id number] Date of Birth: 03-09-1933  [facility] CM/SW Contact:  Beverly Sessions, RN Phone Number: 10/16/2020, 9:54 AM   Clinical Narrative:    Clinical sent to [facility]. [facility] confirms patient can come with [facility] pending DC information send in [facility] Bedside RN to call report Daughter to transport  Per [employer] no additional covid testing needed   Outpatient palliative referral made to penny with Manufacturing engineer   Final next level of care: Skilled Nursing Facility Barriers to Discharge: No Barriers Identified   Patient Goals and CMS Choice        Discharge Placement                Patient to be transferred to facility by: Daughter Name of family member notified: Daughter Patient and family notified of of transfer: 10/16/20  Discharge Plan and Services In-house Referral: Clinical Social Work                        HH Arranged: PT,OT [place] Agency: [place] ([place]) Date [place]: 10/07/20 Time [place]: 1706 Representative spoke with at [place]: [place] Determinants of Health ([place]) Interventions     Readmission Risk Interventions No flowsheet data found.

## 2020-10-16 NOTE — Progress Notes (Signed)
Discharge instructions given to daughter and verbalized understanding.  Report called to facility.

## 2020-10-16 NOTE — Discharge Summary (Signed)
[facility] [place] NAME: Angela Mason    MR#:  [id number]  DATE OF BIRTH:  Mar 02, 1933  DATE OF ADMISSION:  10/04/2020   ADMITTING PHYSICIAN: Geradine Girt, DO  DATE OF DISCHARGE: 10/16/2020  PRIMARY CARE PHYSICIAN: Albina Billet, MD   ADMISSION DIAGNOSIS:  Hyponatremia [E87.1] Encephalopathy [V03.50] Acute metabolic encephalopathy [K93.81] AMS (altered mental status) [R41.82] DISCHARGE DIAGNOSIS:  Principal Problem:   Severe major depression, single episode, with psychotic features ([place]) Active Problems:   Acute metabolic encephalopathy   HLD (hyperlipidemia)   HTN (hypertension)   Hypothyroidism   Hyponatremia   Hypertensive urgency   UTI (urinary tract infection)   AMS (altered mental status)  SECONDARY DIAGNOSIS:   Past Medical History:  Diagnosis Date  . Cancer ([place])    SKIN  . COPD, mild ([place])   . Dyspnea   . Edema   . HLD (hyperlipidemia)   . HTN (hypertension)   . Hypothyroidism   . Macular degeneration, bilateral   . Varicosities    HOSPITAL COURSE:  [facility] an 87 y.o.femalewith medical history significant ofhypertension, hyperlipidemia, COPD, hypothyroidism, cystocele, who presents with altered mental status. Pt was noted to be confused in the past several days, withparanoia, not acting like herself.She has poor appetite and decreased oral intake. She was diagnosed with UTI on 3/7 and started on cefuroxime, but was not clinically improving so was admitted here.  Acute metabolic encephalopathy  Severe depression with psychotic features Likely was multifactorial etiology, including hypertensive urgency, hyponatremia, UTI CT head and MRI of the brain negative for acute intracranial abnormalities.  --Neurology seen- no fever, s/pIV acyclovir due to HSV suspicion which was discontinued as per neurology, no need of LP.EEGmild diffuse encephalopathy, no seizures or epileptiform  discharges. --Psychiatry seen and recommends to continue Remeron to 30 mg and Abilify 2 mg daily at discharge She will need outpatient evaluation follow-up with psychiatry.  Generalized Weakness / Physical Debility -multifactorial SNF and 24/7 supervision  UTI - POA  Completed treatment  Hyperlipidemia - continue statin  Hx of HTN with Hypertensive Urgency - BP was 207/92, 182/91 upon admission.  Likely due to medication non-compliance. -continue homeAtenololbut change to daily-- HR in the 60s so not able to increase atenolol -much improved-  H/o Hypothyroidism not taking meds now.  TSH normal.  Hyponatremia - Resolved.  Na was 124 upon admission, most likely due to topoor oral intake and dehydration TSH, cortisol normal. --Sodium 133 at discharge  Alcohol use - ruled out. D/w daughter.  Severe protein calorie malnutrition -  Body mass index is 16.6 kg/m. Continue supplemental protein/nutrition drinks. Unrestricted diet.  mirtazapine should help appetite as well.  Overall poor prognosis recommend palliative care to follow at discharge to SNF -consider transition to hospice if and when appropriate.   DISCHARGE CONDITIONS:  Fair CONSULTS OBTAINED:   DRUG ALLERGIES:   Allergies  Allergen Reactions  . Gentamicin   . Levofloxacin Diarrhea   DISCHARGE MEDICATIONS:   Allergies as of 10/16/2020      Reactions   Gentamicin    Levofloxacin Diarrhea      Medication List    TAKE these medications   ARIPiprazole 2 MG tablet Commonly known as: ABILIFY Take 1 tablet (2 mg total) by mouth at bedtime.   atenolol 25 MG tablet Commonly known as: TENORMIN Take 1 tablet (25 mg total) by mouth daily. What changed: when to take this   calcium-vitamin  D 500-200 MG-UNIT tablet Commonly known as: OSCAL WITH D Take 1 tablet by mouth 2 (two) times daily.   CO Q 10 PO Take 1 tablet by mouth daily.   CRANBERRY PO Take by mouth.   FISH OIL CONCENTRATE PO Take 1  tablet by mouth daily.   lovastatin 20 MG tablet Commonly known as: MEVACOR Take 20 mg by mouth at bedtime.   melatonin 5 MG Tabs Take 1 tablet (5 mg total) by mouth every evening.   mirtazapine 30 MG disintegrating tablet Commonly known as: REMERON SOL-TAB Take 1 tablet (30 mg total) by mouth at bedtime.   PreserVision AREDS Tabs Take 1 capsule by mouth 2 (two) times daily. What changed: Another medication with the same name was removed. Continue taking this medication, and follow the directions you see here.   tetrahydrozoline 0.05 % ophthalmic solution Place 1 drop into both eyes daily as needed (dry eyes).   Trimo-San 0.025 % Gel Generic drug: OXYQUINOLONE SULFATE VAGINAL Place 1 application vaginally once a week.      DISCHARGE INSTRUCTIONS:   DIET:  Cardiac diet DISCHARGE CONDITION:  Stable ACTIVITY:  Activity as tolerated OXYGEN:  Home Oxygen: No.  Oxygen Delivery: room air DISCHARGE LOCATION:  nursing home   If you experience worsening of your admission symptoms, develop shortness of breath, life threatening emergency, suicidal or homicidal thoughts you must seek medical attention immediately by calling 911 or calling your MD immediately  if symptoms less severe.  You Must read complete instructions/literature along with all the possible adverse reactions/side effects for all the Medicines you take and that have been prescribed to you. Take any new Medicines after you have completely understood and accpet all the possible adverse reactions/side effects.   Please note  You were cared for by a hospitalist during your hospital stay. If you have any questions about your discharge medications or the care you received while you were in the hospital after you are discharged, you can call the unit and asked to speak with the hospitalist on call if the hospitalist that took care of you is not available. Once you are discharged, your primary care physician will handle any  further medical issues. Please note that NO REFILLS for any discharge medications will be authorized once you are discharged, as it is imperative that you return to your primary care physician (or establish a relationship with a primary care physician if you do not have one) for your aftercare needs so that they can reassess your need for medications and monitor your lab values.    On the day of Discharge:  VITAL SIGNS:  Blood pressure (!) 154/70, pulse 63, temperature (!) 97.3 F (36.3 C), temperature source Oral, resp. rate 16, weight 36.7 kg, SpO2 100 %. PHYSICAL EXAMINATION:  GENERAL:  87 y.o.-year-old patient lying in the bed with no acute distress.  EYES: Pupils equal, round, reactive to light and accommodation. No scleral icterus. Extraocular muscles intact.  HEENT: Head atraumatic, normocephalic. Oropharynx and nasopharynx clear.  NECK:  Supple, no jugular venous distention. No thyroid enlargement, no tenderness.  LUNGS: Normal breath sounds bilaterally, no wheezing, rales,rhonchi or crepitation. No use of accessory muscles of respiration.  CARDIOVASCULAR: S1, S2 normal. No murmurs, rubs, or gallops.  ABDOMEN: Soft, non-tender, non-distended. Bowel sounds present. No organomegaly or mass.  EXTREMITIES: No pedal edema, cyanosis, or clubbing.  NEUROLOGIC: Cranial nerves II through XII are intact. Muscle strength 5/5 in all extremities. Sensation intact. Gait not checked.  PSYCHIATRIC: The  patient is inattentive.  Seems to have tangential speech.  Mood seems anxious and depressed.  Cognition is impaired.  Judgment is impulsive. SKIN: No obvious rash, lesion, or ulcer.  DATA REVIEW:   CBC Recent Labs  Lab 10/15/20 0612  WBC 6.7  HGB 11.3*  HCT 34.4*  PLT 231    Chemistries  Recent Labs  Lab 10/13/20 0431 10/14/20 0449 10/15/20 0612  NA 136   < > 133*  K 4.2   < > 4.3  CL 97*   < > 97*  CO2 33*   < > 32  GLUCOSE 106*   < > 99  BUN 16   < > 21  CREATININE 0.63   < > 0.65   CALCIUM 9.5   < > 9.0  MG 1.8  --   --    < > = values in this interval not displayed.     Outpatient follow-up  Follow-up Information    Albina Billet, MD. Schedule an appointment as soon as possible for a visit in 1 week(s).   Specialty: Internal Medicine Contact information: [address]   [place] [address] [phone]               73 Day Unplanned Readmission Risk Score   Flowsheet Row ED to Hosp-Admission (Current) from 10/04/2020 in [facility]  30 Day Unplanned Readmission Risk Score (%) 16.31 Filed at 10/16/2020 0801     This score is the patient's risk of an unplanned readmission within 30 days of being discharged (0 -100%). The score is based on dignosis, age, lab data, medications, orders, and past utilization.   Low:  0-14.9   Medium: 15-21.9   High: 22-29.9   Extreme: 30 and above         Management plans discussed with the patient, family and they are in agreement.  CODE STATUS: Full Code   TOTAL TIME TAKING CARE OF THIS PATIENT: 45 minutes.    Max Sane M.D on 10/16/2020 at 9:18 AM  [facility]   CC: Primary care physician; Albina Billet, MD   Note: This dictation was prepared with Dragon dictation along with smaller phrase technology. Any transcriptional errors that result from this process are unintentional.

## 2020-10-16 NOTE — Progress Notes (Signed)
[facility]) Hospital Liaison RN note  This patient has been referred to [facility] outpatient based palliative care program to be followed at the time of discharge.  Will follow while inpatient for disposition.  Please call for any outpatient based palliative care questions or concerns.  Thank you, Margaretmary Eddy, BSN, RN [facility] Liaison [phone]

## 2020-10-16 NOTE — Plan of Care (Signed)
  Problem: Education: Goal: Knowledge of General Education information will improve Description: Including pain rating scale, medication(s)/side effects and non-pharmacologic comfort measures 10/16/2020 0934 by Vivien Rota, RN Outcome: Adequate for Discharge 10/16/2020 0934 by Vivien Rota, RN Outcome: Adequate for Discharge   Problem: Health Behavior/Discharge Planning: Goal: Ability to manage health-related needs will improve 10/16/2020 0934 by Vivien Rota, RN Outcome: Adequate for Discharge 10/16/2020 0934 by Vivien Rota, RN Outcome: Adequate for Discharge   Problem: Clinical Measurements: Goal: Ability to maintain clinical measurements within normal limits will improve 10/16/2020 0934 by Vivien Rota, RN Outcome: Adequate for Discharge 10/16/2020 0934 by Vivien Rota, RN Outcome: Adequate for Discharge Goal: Will remain free from infection 10/16/2020 0934 by Vivien Rota, RN Outcome: Adequate for Discharge 10/16/2020 0934 by Vivien Rota, RN Outcome: Adequate for Discharge Goal: Diagnostic test results will improve 10/16/2020 0934 by Vivien Rota, RN Outcome: Adequate for Discharge 10/16/2020 0934 by Vivien Rota, RN Outcome: Adequate for Discharge Goal: Respiratory complications will improve 10/16/2020 0934 by Vivien Rota, RN Outcome: Adequate for Discharge 10/16/2020 0934 by Vivien Rota, RN Outcome: Adequate for Discharge Goal: Cardiovascular complication will be avoided 10/16/2020 0934 by Vivien Rota, RN Outcome: Adequate for Discharge 10/16/2020 0934 by Vivien Rota, RN Outcome: Adequate for Discharge   Problem: Activity: Goal: Risk for activity intolerance will decrease 10/16/2020 0934 by Vivien Rota, RN Outcome: Adequate for Discharge 10/16/2020 0934 by Vivien Rota, RN Outcome: Adequate for Discharge   Problem: Nutrition: Goal: Adequate  nutrition will be maintained 10/16/2020 0934 by Vivien Rota, RN Outcome: Adequate for Discharge 10/16/2020 0934 by Vivien Rota, RN Outcome: Adequate for Discharge   Problem: Coping: Goal: Level of anxiety will decrease 10/16/2020 0934 by Vivien Rota, RN Outcome: Adequate for Discharge 10/16/2020 0934 by Vivien Rota, RN Outcome: Adequate for Discharge   Problem: Elimination: Goal: Will not experience complications related to bowel motility 10/16/2020 0934 by Vivien Rota, RN Outcome: Adequate for Discharge 10/16/2020 0934 by Vivien Rota, RN Outcome: Adequate for Discharge Goal: Will not experience complications related to urinary retention 10/16/2020 0934 by Vivien Rota, RN Outcome: Adequate for Discharge 10/16/2020 0934 by Vivien Rota, RN Outcome: Adequate for Discharge   Problem: Pain Managment: Goal: General experience of comfort will improve 10/16/2020 0934 by Vivien Rota, RN Outcome: Adequate for Discharge 10/16/2020 0934 by Vivien Rota, RN Outcome: Adequate for Discharge   Problem: Safety: Goal: Ability to remain free from injury will improve 10/16/2020 0934 by Vivien Rota, RN Outcome: Adequate for Discharge 10/16/2020 0934 by Vivien Rota, RN Outcome: Adequate for Discharge   Problem: Skin Integrity: Goal: Risk for impaired skin integrity will decrease 10/16/2020 0934 by Vivien Rota, RN Outcome: Adequate for Discharge 10/16/2020 0934 by Vivien Rota, RN Outcome: Adequate for Discharge

## 2020-10-17 ENCOUNTER — Encounter: Payer: Medicare Other | Admitting: Obstetrics and Gynecology

## 2020-10-18 ENCOUNTER — Telehealth: Payer: Self-pay | Admitting: Obstetrics and Gynecology

## 2020-10-18 DIAGNOSIS — F333 Major depressive disorder, recurrent, severe with psychotic symptoms: Secondary | ICD-10-CM | POA: Diagnosis not present

## 2020-10-18 DIAGNOSIS — J449 Chronic obstructive pulmonary disease, unspecified: Secondary | ICD-10-CM

## 2020-10-18 DIAGNOSIS — E440 Moderate protein-calorie malnutrition: Secondary | ICD-10-CM | POA: Diagnosis not present

## 2020-10-18 DIAGNOSIS — E871 Hypo-osmolality and hyponatremia: Secondary | ICD-10-CM | POA: Diagnosis not present

## 2020-10-18 DIAGNOSIS — I10 Essential (primary) hypertension: Secondary | ICD-10-CM | POA: Diagnosis not present

## 2020-10-18 NOTE — Telephone Encounter (Signed)
Please advise. Pt's daughter is aware that there is 24 hour return call policy.

## 2020-10-18 NOTE — Telephone Encounter (Signed)
Angela Mason (daughter and POA) called in and states that Angela Mason was in the hospital from 10/04/2020 until 10/16/2020 due to UTI and other issues surrounding the pessary.  Angela Mason is concerned about Angela Mason's pessary.  Angela Mason states she would like to speak to Dr. Marcelline Mates and ask some questions.  Angela Mason states she has some concerns about how often her pessary is being changed and her hygiene in regards to the pessary.  Confirmed with daughter the call back number is [phone]) or [phone] (home).  Please advise.

## 2020-10-19 ENCOUNTER — Telehealth: Payer: Self-pay | Admitting: Obstetrics and Gynecology

## 2020-10-19 NOTE — Telephone Encounter (Signed)
New message    Daughter Rudene Re calling back to speak with Dr. Marcelline Mates.   Asking for a call back.

## 2020-10-19 NOTE — Telephone Encounter (Signed)
Attempted to call patient's daughter Katheran James regarding concerns.  Called mobile number and left message to return call. Called home number with no answer.  Will attempt to contact again on Monday after the weekend.   Rubie Maid, MD [facility]

## 2020-10-19 NOTE — Telephone Encounter (Signed)
Please advise. Thanks Fikisha 

## 2020-10-22 NOTE — Telephone Encounter (Signed)
Attempted to contact patient's daughter again regarding her concerns. Also attempted to contact last Friday. Left voicemail to return call then.  No answer today at patient's home or with daughter's cell phone.    Dr. Marcelline Mates

## 2020-10-23 ENCOUNTER — Other Ambulatory Visit: Payer: Self-pay

## 2020-10-23 ENCOUNTER — Ambulatory Visit (INDEPENDENT_AMBULATORY_CARE_PROVIDER_SITE_OTHER): Payer: Medicare Other | Admitting: Obstetrics and Gynecology

## 2020-10-23 ENCOUNTER — Encounter: Payer: Self-pay | Admitting: Obstetrics and Gynecology

## 2020-10-23 VITALS — BP 178/85 | HR 74 | Ht 58.5 in | Wt 79.2 lb

## 2020-10-23 DIAGNOSIS — N813 Complete uterovaginal prolapse: Secondary | ICD-10-CM

## 2020-10-23 DIAGNOSIS — N952 Postmenopausal atrophic vaginitis: Secondary | ICD-10-CM | POA: Diagnosis not present

## 2020-10-23 DIAGNOSIS — Z4689 Encounter for fitting and adjustment of other specified devices: Secondary | ICD-10-CM | POA: Diagnosis not present

## 2020-10-23 DIAGNOSIS — R410 Disorientation, unspecified: Secondary | ICD-10-CM

## 2020-10-23 DIAGNOSIS — B999 Unspecified infectious disease: Secondary | ICD-10-CM

## 2020-10-23 DIAGNOSIS — R636 Underweight: Secondary | ICD-10-CM

## 2020-10-23 DIAGNOSIS — N390 Urinary tract infection, site not specified: Secondary | ICD-10-CM

## 2020-10-23 DIAGNOSIS — F23 Brief psychotic disorder: Secondary | ICD-10-CM

## 2020-10-23 LAB — POCT URINALYSIS DIPSTICK
Bilirubin, UA: NEGATIVE
Glucose, UA: NEGATIVE
Ketones, UA: NEGATIVE
Nitrite, UA: NEGATIVE
Protein, UA: NEGATIVE
Spec Grav, UA: 1.015 (ref 1.010–1.025)
Urobilinogen, UA: 0.2 E.U./dL
pH, UA: 7.5 (ref 5.0–8.0)

## 2020-10-23 NOTE — Progress Notes (Signed)
Pt present for pessary check. Pt is currently being treated at the hospital for UTI symptoms and issues. Pt's daughter is with her today due to pt being unstable to walk.

## 2020-10-23 NOTE — Telephone Encounter (Signed)
Patient was seen in the office today

## 2020-10-23 NOTE — Progress Notes (Incomplete)
GYNECOLOGY PROGRESS NOTE  Subjective:    [name], female    DOB: 1933-05-19, 87 y.o.   MRN: [id number]  HPI   GYNECOLOGY PROGRESS NOTE  Subjective:    [name], female    DOB: 05-Jul-1933, 87 y.o.   MRN: [id number]  HPI  Patient is a 87 y.o. G18P1001 female who presents for follow up after recent hospital admission for complicated UTI. Patient is accompanied by her daughter today due to issues of residual confusion and paranoia.  Patient was diagnosed with a  UTI at her last visit.  Per her daughter, patient waited approximately 5 days before picking up her prescription.Over the course of those days, she was noted by nursing facility staff at the [facility] that she was becoming more disoriented and confused.  She was taken to the hospital, where she was admitted for a total of 12 days. During that time she received IV antibiotics, physical therapy (due to developing some generalized weakness), and evaluated by Psychiatry. Per patient's daughter, she reports that per the [facility] facility medical provider, that patient has had ~ 18 UTI's over the past 2 years. She does have a history of cystocele, rectocele, complete procidentia,and incomplete bladder emptying.  Currently has a pessary in place.  Patient had been referred to Urology but notes that she did not like that office.    The following portions of the patient's history were reviewed and updated as appropriate: allergies, current medications, past family history, past medical history, past social history, past surgical history and problem list.  Review of Systems Pertinent items noted in HPI and remainder of comprehensive ROS otherwise negative.   Objective:   Blood pressure (!) 178/85, pulse 74, height 4' 10.5" (1.486 m), weight 79 lb 3.2 oz (35.9 kg).  Body mass index is 16.27 kg/m.  General appearance: alert and no distress Abdomen: soft, non-tender; bowel sounds normal; no  masses,  no organomegaly Pelvic: the patient'sSize 3 ring with support pessary was removed, cleaned and replaced without complications.External exam appears normal.  Speculum examination revealedmild to moderate atrophicvaginal mucosa,no lesions.     Assessment:   Pessary maintenance Pelvic organ prolapse (cystocele, rectocele, and complete procidentia) Vaginal atrophy Underweight  Plan:   - Pessary cleaned and replaced today. To f/u in 10-12 weeks for pessary check.  - To continue use of Premarin cream use.  - Underweight, discussed concerns with patient.  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 Advised on possible increase in dietary protein/higher caloric intake or use of supplemental protein shakes.   Rubie Maid, MD [facility]  Patient is a 87 y.o. G51P1001 female who presents for routine pessary check. She has a history of cystocele, rectocele, complete procidentia,and incomplete bladder emptying.     The following portions of the patient's history were reviewed and updated as appropriate: allergies, current medications, past family history, past medical history, past social history, past surgical history and problem list.  Review of Systems Pertinent items noted in HPI and remainder of comprehensive ROS otherwise negative.   Objective:   Blood pressure (!) 178/85, pulse 74, height 4' 10.5" (1.486 m), weight 79 lb 3.2 oz (35.9 kg).  Body mass index is 16.27 kg/m.  General appearance: alert and no distress Abdomen: soft, non-tender; bowel sounds normal; no masses,  no organomegaly Pelvic: the patient'sSize 3 ring with support pessary was removed, cleaned and replaced without complications.External exam  appears normal.  Speculum examination revealedmild to moderate atrophicvaginal mucosa,no lesions.     Assessment:   Pessary maintenance Pelvic organ prolapse (cystocele, rectocele, and complete  procidentia) Vaginal atrophy Underweight  Plan:   - Pessary cleaned and replaced today. To f/u in 10-12 weeks for pessary check.  - To continue use of Premarin cream use.  - Underweight, discussed concerns with patient.  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 Advised on possible increase in dietary protein/higher caloric intake or use of supplemental protein shakes.   Rubie Maid, MD [facility]

## 2020-10-24 ENCOUNTER — Encounter: Payer: Self-pay | Admitting: Obstetrics and Gynecology

## 2020-10-24 MED ORDER — PREMARIN 0.625 MG/GM VA CREA: 1.0000 | TOPICAL_CREAM | VAGINAL | 12 refills | Status: DC

## 2020-10-24 NOTE — Patient Instructions (Signed)
Atrophic Vaginitis  Atrophic vaginitis is a condition in which the tissues that line the vagina become dry and thin. This condition is most common in women who have stopped having regular menstrual periods (are in menopause). This usually starts when a woman is 74 to 85 years old. That is the time when a woman's estrogen levels begin to decrease. Estrogen is a female hormone. It helps to keep the tissues of the vagina moist. It stimulates the vagina to produce a clear fluid that lubricates the vagina for sex. This fluid also protects the vagina from infection. Lack of estrogen can cause the lining of the vagina to get thinner and dryer. The vagina may also shrink in size. It may become less elastic. Atrophic vaginitis tends to get worse over time as a woman's estrogen level drops. What are the causes? This condition is caused by the normal drop in estrogen that happens around the time of menopause. What increases the risk? Certain conditions or situations may lower a woman's estrogen level, leading to a higher risk for atrophic vaginitis. You are more likely to develop this condition if:  You are taking medicines that block estrogen.  You have had your ovaries removed.  You are being treated for cancer with radiation or medicines (chemotherapy).  You have given birth or are breastfeeding.  You are older than age 68.  You smoke. What are the signs or symptoms? Symptoms of this condition include:  Pain, soreness, a feeling of pressure, or bleeding during sex (dyspareunia).  Vaginal burning, irritation, or itching.  Pain or bleeding when a speculum is used in a vaginal exam.  Having burning pain while urinating.  Vaginal discharge. In some cases, there are no symptoms. How is this diagnosed? This condition is diagnosed based on your medical history and a physical exam. This will include a pelvic exam that checks the vaginal tissues. Though rare, you may also have other tests,  including:  A urine test.  A test that checks the acid balance in your vagina (acid balance test). How is this treated? Treatment for this condition depends on how severe your symptoms are. Treatment may include:  Using an over-the-counter vaginal lubricant before sex.  Using a long-acting vaginal moisturizer.  Using low-dose estrogen for moderate to severe symptoms that do not respond to other treatments. Options include creams, tablets, and inserts (vaginal rings). Before you use a vaginal estrogen, tell your health care provider if you have a history of: ? Breast cancer. ? Endometrial cancer. ? Blood clots. If you are not sexually active and your symptoms are very mild, you may not need treatment. Follow these instructions at home: Medicines  Take over-the-counter and prescription medicines only as told by your health care provider.  Do not use herbal or alternative medicines unless your health care provider says that you can.  Use over-the-counter creams, lubricants, or moisturizers for dryness only as told by your health care provider. General instructions  If your atrophic vaginitis is caused by menopause, discuss all of your menopause symptoms and treatment options with your health care provider.  Do not douche.  Do not use products that can make your vagina dry. These include: ? Scented feminine sprays. ? Scented tampons. ? Scented soaps.  Vaginal sex can help to improve blood flow and elasticity of vaginal tissue. If you choose to have sex and it hurts, try using a water-soluble lubricant or moisturizer right before having sex. Contact a health care provider if:  Your discharge looks  different than normal.  Your vagina has an unusual smell.  You have new symptoms.  Your symptoms do not improve with treatment.  Your symptoms get worse. Summary  Atrophic vaginitis is a condition in which the tissues that line the vagina become dry and thin. It is most common  in women who have stopped having regular menstrual periods (are in menopause).  Treatment options include using vaginal lubricants and low-dose vaginal estrogen.  Contact a health care provider if your vagina has an unusual smell, or if your symptoms get worse or do not improve after treatment. This information is not intended to replace advice given to you by your health care provider. Make sure you discuss any questions you have with your health care provider. Document Revised: 01/12/2020 Document Reviewed: 01/12/2020 Elsevier Patient Education  [place].

## 2020-10-24 NOTE — Telephone Encounter (Signed)
Pt was seen in the office yesterday 10/23/2020. Thanks [employer]

## 2020-10-24 NOTE — Progress Notes (Signed)
GYNECOLOGY PROGRESS NOTE  Subjective:    [name], female    DOB: [birth year], 87 y.o.   MRN: [id number]  HPI  Patient is a 87 y.o. G21P1001 female who presents for follow up after recent hospital admission for complicated UTI. Patient is accompanied by her daughter today due to issues of residual confusion and paranoia.  Patient was diagnosed with a  UTI at her doctor's last visit.  Per her daughter, patient waited approximately 5 days before picking up her prescription.Over the course of those days, she was noted by nursing facility staff at the [facility] that she was becoming more disoriented and confused.  She was taken to the hospital, where she was admitted for a total of 12 days (10/04/20-10/16/20). During that time she received IV antibiotics, physical therapy (due to developing some generalized weakness), and evaluated by Psychiatry. Per patient's daughter, she reports that per the [facility] facility medical provider, that patient has had ~ 18 UTI's over the past 2 years. She does have a history of cystocele, rectocele, complete procidentia,and incomplete bladder emptying. Currently has a pessary in place.  Patient had been referred to Urology but notes that she did not like that office.    The following portions of the patient's history were reviewed and updated as appropriate: allergies, current medications, past family history, past medical history, past social history, past surgical history and problem list.  Current Outpatient Medications on File Prior to Visit  Medication Sig Dispense Refill  . ARIPiprazole (ABILIFY) 2 MG tablet Take 1 tablet (2 mg total) by mouth at bedtime.    . ARIPiprazole (ABILIFY) 5 MG tablet Take 5 mg by mouth daily.    Marland Kitchen atenolol (TENORMIN) 25 MG tablet Take 1 tablet (25 mg total) by mouth daily.    . calcium-vitamin D (OSCAL WITH D) 500-200 MG-UNIT tablet Take 1 tablet by mouth 2 (two) times daily.    . Coenzyme Q10  (CO Q 10 PO) Take 1 tablet by mouth daily.     Marland Kitchen CRANBERRY PO Take by mouth.    . lovastatin (MEVACOR) 20 MG tablet Take 20 mg by mouth at bedtime.    . melatonin 5 MG TABS Take 1 tablet (5 mg total) by mouth every evening.  0  . Omega-3 Fatty Acids (FISH OIL CONCENTRATE PO) Take 1 tablet by mouth daily.     Levin Erp SULFATE VAGINAL (TRIMO-SAN) 0.025 % GEL Place 1 application vaginally once a week. 113.4 g 0  . tetrahydrozoline 0.05 % ophthalmic solution Place 1 drop into both eyes daily as needed (dry eyes).    . mirtazapine (REMERON SOL-TAB) 30 MG disintegrating tablet Take 1 tablet (30 mg total) by mouth at bedtime. (Patient not taking: Reported on 10/23/2020)    . Multiple Vitamins-Minerals (PRESERVISION AREDS) TABS Take 1 capsule by mouth 2 (two) times daily. (Patient not taking: Reported on 10/23/2020)     No current facility-administered medications on file prior to visit.    Review of Systems Pertinent items noted in HPI and remainder of comprehensive ROS otherwise negative.   Objective:   Blood pressure (!) 178/85, pulse 74, height 4' 10.5" (1.486 m), weight 79 lb 3.2 oz (35.9 kg).  Body mass index is 16.27 kg/m.  General appearance: alert and no distress Abdomen: soft, non-tender; bowel sounds normal; no masses,  no organomegaly Pelvic: the patient'sSize 3 ring with support pessary was removed, cleaned and replaced without complications.External exam appears normal.  Speculum examination  revealedmild to moderateatrophicvaginal mucosa,no lesions.    Labs:  Results for orders placed or performed in visit on 10/23/20  POCT urinalysis dipstick  Result Value Ref Range   Color, UA yellow    Clarity, UA clear    Glucose, UA Negative Negative   Bilirubin, UA neg    Ketones, UA neg    Spec Grav, UA 1.015 1.010 - 1.025   Blood, UA large +3    pH, UA 7.5 5.0 - 8.0   Protein, UA Negative Negative   Urobilinogen, UA 0.2 0.2 or 1.0 E.U./dL   Nitrite, UA neg     Leukocytes, UA Large (3+) (A) Negative   Appearance yellow;clear    Odor       Assessment:   1. Pessary maintenance   2. Vaginal atrophy   3. 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 Recurrent UTI   5. Confusion associated with infection   6. Acute paranoid reaction ([place])   7. Underweight due to inadequate caloric intake     Plan:   -Pessary cleaned and replaced today. To f/u in 10-12 weeks for pessary check. - Patient not actively using  Premarin cream for her vaginal atrophy as prescribed. Notes that often she forgets as she is busy taking care of her husband.  She also reports concerns about risk of cancer.  I reiterated to patient (and also informed patient's daughter) that her risk of cancer from the use of local therapy would be low.  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  Use of the localized estrogen may also reduce her UTI occurrences. Prescribed Premarin again. Encouraged to use for 2 weeks daily (1/2 gram) and place dime-sized amount near urethral area, and then decrease to twice weekly use. She will follow up in 2-3 weeks for re-evaluation.  - UA today with moderate leukocytes.  Patient desires to make sure that she is not developing another UTI. UA with large leukocytes and blood.  Will order urine culture.  Patient may also benefit from long-term prophylactic antibiotic therapy for UTI's if positive.  - Patient's daughter also feels that patient needs a therapist.  Patient has an appointment with a Psychiatrist for follow up due to her new onset paranoia, auditory hallucinations.  She has been initiated on Abilify, and daughter reports that she has begun having more good days, but still having a majority of the bad days at this time.  She understands that these types of medications take time to act, between 2-4 weeks, so will just  keep monitoring.  Would like resources on assistance for her mother, as patient is only here visiting, currently lives in [place], [place]. Will attempt to work with Social Work to gather available resources.  - Underweight, patient has not had adequate caloric intake for some time, taking care of her husband and lack of self-care. Again encouraged to begin nutritional supplementation.   A total of 25 minutes were spent face-to-face with the patient and her daughter during this encounter and over half of that time involved review of records, counseling, and coordination of care.  Rubie Maid, MD [facility]

## 2020-10-25 LAB — URINE CULTURE

## 2020-10-29 ENCOUNTER — Telehealth: Payer: Self-pay | Admitting: Obstetrics and Gynecology

## 2020-10-29 NOTE — Telephone Encounter (Signed)
error 

## 2020-10-30 NOTE — TOC Progression Note (Addendum)
Transition of Care [facility]) - Progression Note    Patient Details  Name: Angela Mason MRN: [id number] Date of Birth: 1933/06/26  Transition of Care [facility]) CM/SW [place], RN Phone Number: 10/30/2020, 11:50 AM  Clinical Narrative:    PASRR number received with # [id number] H Updated Seth Bake @ [place]-PASRR had been received.    Expected Discharge Plan: [place] Barriers to Discharge: No Barriers Identified  Expected Discharge Plan and Services Expected Discharge Plan: [place] In-house Referral: Clinical Social Work     Living arrangements for the past 2 months: [place] Expected Discharge Date: 10/16/20                         HH Arranged: PT,OT [place] Agency: [place] ([place]) Date [place]: 10/07/20 Time [place]: 1706 Representative spoke with at [place]: [place] ([place]) Interventions    Readmission Risk Interventions No flowsheet data found.

## 2020-11-02 ENCOUNTER — Telehealth: Payer: Self-pay | Admitting: Obstetrics and Gynecology

## 2020-11-02 NOTE — Telephone Encounter (Signed)
Pt called asking for a return call to explain medication ([place]) and instructions in more detail. Please Advise.

## 2020-11-02 NOTE — Telephone Encounter (Signed)
Pt called no answer unable to lm due to no vm picked up.

## 2020-11-06 ENCOUNTER — Ambulatory Visit (INDEPENDENT_AMBULATORY_CARE_PROVIDER_SITE_OTHER): Payer: Medicare Other | Admitting: Obstetrics and Gynecology

## 2020-11-06 ENCOUNTER — Encounter: Payer: Self-pay | Admitting: Obstetrics and Gynecology

## 2020-11-06 ENCOUNTER — Other Ambulatory Visit: Payer: Self-pay

## 2020-11-06 VITALS — BP 185/91 | HR 86 | Ht 58.5 in | Wt 82.1 lb

## 2020-11-06 DIAGNOSIS — N958 Other specified menopausal and perimenopausal disorders: Secondary | ICD-10-CM | POA: Diagnosis not present

## 2020-11-06 DIAGNOSIS — N952 Postmenopausal atrophic vaginitis: Secondary | ICD-10-CM

## 2020-11-06 DIAGNOSIS — Z960 Presence of urogenital implants: Secondary | ICD-10-CM | POA: Diagnosis not present

## 2020-11-06 DIAGNOSIS — N390 Urinary tract infection, site not specified: Secondary | ICD-10-CM

## 2020-11-06 DIAGNOSIS — I10 Essential (primary) hypertension: Secondary | ICD-10-CM

## 2020-11-06 NOTE — Progress Notes (Signed)
Pt present for follow up. Pt stated that she was doing well.

## 2020-11-06 NOTE — Progress Notes (Signed)
    GYNECOLOGY PROGRESS NOTE  Subjective:    [name], female    DOB: 29-Dec-1932, 87 y.o.   MRN: [id number]  HPI  Patient is a 87 y.o. G34P1001 female who presents for 2-3 week f/u of vulvovaginal atrophy and genitourinary syndrome.  She currently has a pessary in place.  Was changed from use of the Trimo-san gel to Premarin cream last visit due to her history of recurrent UTI's.  Patient is accompanied by her daughter today.  They report that she has been compliant with her medication. Now down to using twice weekly (previously daily use for the first 2 weeks). Denies any major complaints.   The following portions of the patient's history were reviewed and updated as appropriate: allergies, current medications, past family history, past medical history, past social history, past surgical history and problem list.  Review of Systems Pertinent items noted in HPI and remainder of comprehensive ROS otherwise negative.   Objective:   Blood pressure (!) 185/91, pulse 86, height 4' 10.5" (1.486 m), weight 82 lb 1.6 oz (37.2 kg). General appearance: alert and no distress Abdomen: soft, non-tender; bowel sounds normal; no masses,  no organomegaly Pelvic: external genitalia normal, rectovaginal septum normal.  Vagina with small amount of thin grey discharge. Pessary in place.  Atrophy improving.  Urethra appears much improved, less atrophic than prior exam.    Assessment:   Vaginal atrophy Genitourinary syndrome of menopause Pessary in place History of recurrent UTI's Hypertension   Plan:   1. Advised on continue use of Premarin cream internally and externally twice weekly for a total of 3 months, then may reduce down to external use only ([place]) after this time. Continue to hold Trimo-san gel use for now. Goal of therapy is to reduce frequency of UTI's due to likely menopausal atrophic vaginal and urethral tissues.  2. Continue pessary use for pelvic organ prolapse.  Last cleaned 2-3 weeks ago. Due for cleaning again in 2.5 months. Will schedule.  3. Hypertension, patient reports medication adherence. Patient's daughter reports that they have a visit scheduled with patient's PCP to discuss care management (including possibly getting a care giver or social worker) due to patient's recent change in mental status after last hospitalization. Can also address BPs.    Angela Maid, MD [facility]

## 2020-11-28 ENCOUNTER — Other Ambulatory Visit: Payer: Self-pay | Admitting: Internal Medicine

## 2020-11-28 DIAGNOSIS — Z1231 Encounter for screening mammogram for malignant neoplasm of breast: Secondary | ICD-10-CM

## 2020-12-07 ENCOUNTER — Other Ambulatory Visit: Payer: Self-pay

## 2020-12-07 ENCOUNTER — Ambulatory Visit
Admission: RE | Admit: 2020-12-07 | Discharge: 2020-12-07 | Disposition: A | Discharge status: Home / Self Care | Payer: Medicare Other | Source: Ambulatory Visit | Attending: Internal Medicine | Admitting: Internal Medicine

## 2020-12-07 DIAGNOSIS — Z1231 Encounter for screening mammogram for malignant neoplasm of breast: Secondary | ICD-10-CM | POA: Insufficient documentation

## 2021-01-23 ENCOUNTER — Ambulatory Visit (INDEPENDENT_AMBULATORY_CARE_PROVIDER_SITE_OTHER): Payer: Medicare Other | Admitting: Obstetrics and Gynecology

## 2021-01-23 ENCOUNTER — Other Ambulatory Visit: Payer: Self-pay

## 2021-01-23 ENCOUNTER — Encounter: Payer: Self-pay | Admitting: Obstetrics and Gynecology

## 2021-01-23 VITALS — BP 139/54 | HR 70 | Ht 58.5 in | Wt 81.5 lb

## 2021-01-23 DIAGNOSIS — Z4689 Encounter for fitting and adjustment of other specified devices: Secondary | ICD-10-CM | POA: Diagnosis not present

## 2021-01-23 DIAGNOSIS — N952 Postmenopausal atrophic vaginitis: Secondary | ICD-10-CM

## 2021-01-23 NOTE — Progress Notes (Signed)
    GYNECOLOGY PROGRESS NOTE  Subjective:    [name], female    DOB: [birth year], 87 y.o.   MRN: [id number]  HPI  Patient is a 87 y.o. G77P1001 female who presents for routine pessary check. She has a history of cystocele, rectocele, complete procidentia, and incomplete bladder emptying.  She notes that she is doing well, no issues.   Reports that her doctor recommends for her to use a walker. Notes that she does not feel like she really needs it. Does not note any history of falls.   The following portions of the patient's history were reviewed and updated as appropriate: allergies, current medications, past family history, past medical history, past social history, past surgical history and problem list.  Review of Systems Pertinent items noted in HPI and remainder of comprehensive ROS otherwise negative.   Objective:   Blood pressure (!) 139/54, pulse 70, height 4' 10.5" (1.486 m), weight 81 lb 8 oz (37 kg).  Body mass index is 16.74 kg/m.  General appearance: alert and no distress Abdomen: soft, non-tender; bowel sounds normal; no masses,  no organomegaly Pelvic: the patient's Size 3 ring with support pessary was removed, cleaned and replaced without complications. External exam appears normal.  Speculum examination revealed mild to moderate atrophic vaginal mucosa, no lesions.     Assessment:   Pessary maintenance Pelvic organ prolapse  (cystocele, rectocele, and complete procidentia) Vaginal atrophy  Underweight  Plan:   - Pessary cleaned and replaced today. Patient notes moderate discomfort with removal. Discussed that with her weight loss, current pessary may be a little too large. Can order a 1/2 size smaller. Will place at next visit.  - To continue Premarin cream for vaginal atrophy maintenance.  - Underweight - notes that she is trying to gain some of her weight back.    Rubie Maid, MD [facility]

## 2021-01-23 NOTE — Progress Notes (Signed)
Pt present for pessary check. Pt stated that she was doing well.

## 2021-01-24 ENCOUNTER — Encounter: Payer: Medicare Other | Admitting: Obstetrics and Gynecology

## 2021-02-26 ENCOUNTER — Ambulatory Visit (INDEPENDENT_AMBULATORY_CARE_PROVIDER_SITE_OTHER): Payer: Medicare Other | Admitting: Obstetrics and Gynecology

## 2021-02-26 ENCOUNTER — Encounter: Payer: Self-pay | Admitting: Obstetrics and Gynecology

## 2021-02-26 ENCOUNTER — Other Ambulatory Visit: Payer: Self-pay

## 2021-02-26 VITALS — BP 126/69 | HR 79 | Resp 16 | Ht 58.5 in | Wt 84.3 lb

## 2021-02-26 DIAGNOSIS — N952 Postmenopausal atrophic vaginitis: Secondary | ICD-10-CM

## 2021-02-26 DIAGNOSIS — Z4689 Encounter for fitting and adjustment of other specified devices: Secondary | ICD-10-CM

## 2021-02-26 DIAGNOSIS — N813 Complete uterovaginal prolapse: Secondary | ICD-10-CM | POA: Diagnosis not present

## 2021-02-26 DIAGNOSIS — N958 Other specified menopausal and perimenopausal disorders: Secondary | ICD-10-CM | POA: Diagnosis not present

## 2021-02-26 MED ORDER — PREMARIN 0.625 MG/GM VA CREA: 1.0000 | TOPICAL_CREAM | VAGINAL | 2 refills | Status: DC

## 2021-02-26 NOTE — Progress Notes (Signed)
    GYNECOLOGY PROGRESS NOTE  Subjective:    [name], female    DOB: 06/28/1933, 87 y.o.   MRN: [id number]  HPI  Patient is a 87 y.o. G55P1001 female who presents for pessary check exchange. She has a history of cystocele, rectocele, complete procidentia, and incomplete bladder emptying.  She notes the pessary feels like it is sucking her insides out, the suction on it. Also needs refill on her Premarin cream.   She continues to use her walker as recommended by doctor. Notes that she does not feel like she really needs it. Does not note any history of falls.    The following portions of the patient's history were reviewed and updated as appropriate: allergies, current medications, past family history, past medical history, past social history, past surgical history and problem list.   The following portions of the patient's history were reviewed and updated as appropriate: allergies, current medications, past family history, past medical history, past social history, past surgical history, and problem list.  Review of Systems Pertinent items noted in HPI and remainder of comprehensive ROS otherwise negative.   Objective:   Blood pressure 126/69, pulse 79, resp. rate 16, height 4' 10.5" (1.486 m), weight 84 lb 4.8 oz (38.2 kg). Body mass index is 17.32 kg/m. General appearance: alert and no distress Abdomen: soft, non-tender; bowel sounds normal; no masses,  no organomegaly Pelvic: the patient's Size 3 ring with support pessary was removed, replaced with a size 2  3/4 ring with support pessary without complications. External exam appears normal.  Speculum examination revealed mild to moderate atrophic vaginal mucosa, no lesions.   Assessment:   1. Pessary maintenance   2. Vaginal atrophy   3. Genitourinary syndrome of menopause   4. 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 Patient without any major  issues. Will f/u in 3 months for pessary check.  - Continue use of Premarin cream for maintenance of vaginal atrophy and menopausal genitourinary syndrome.    Rubie Maid, MD [facility]

## 2021-03-05 ENCOUNTER — Ambulatory Visit (INDEPENDENT_AMBULATORY_CARE_PROVIDER_SITE_OTHER): Payer: Medicare Other | Admitting: Psychiatry

## 2021-03-05 ENCOUNTER — Encounter: Payer: Self-pay | Admitting: Psychiatry

## 2021-03-05 ENCOUNTER — Other Ambulatory Visit: Payer: Self-pay

## 2021-03-05 VITALS — BP 155/78 | HR 67 | Temp 98.5°F

## 2021-03-05 DIAGNOSIS — F325 Major depressive disorder, single episode, in full remission: Secondary | ICD-10-CM | POA: Insufficient documentation

## 2021-03-05 DIAGNOSIS — Z79899 Other long term (current) drug therapy: Secondary | ICD-10-CM | POA: Diagnosis not present

## 2021-03-05 DIAGNOSIS — Z87898 Personal history of other specified conditions: Secondary | ICD-10-CM

## 2021-03-05 DIAGNOSIS — R030 Elevated blood-pressure reading, without diagnosis of hypertension: Secondary | ICD-10-CM

## 2021-03-05 HISTORY — DX: Personal history of other specified conditions: Z87.898

## 2021-03-05 MED ORDER — ARIPIPRAZOLE 2 MG PO TABS: 2.0000 mg | ORAL_TABLET | Freq: Every day | ORAL | 1 refills | Status: DC

## 2021-03-05 MED ORDER — MIRTAZAPINE 15 MG PO TABS: 15.0000 mg | ORAL_TABLET | Freq: Every day | ORAL | 1 refills | Status: DC

## 2021-03-05 NOTE — Progress Notes (Signed)
Psychiatric Initial Adult Assessment   Patient Identification: Angela Mason MRN:  [id number] Date of Evaluation:  03/05/2021 Referral Source: Benita Stabile MD Chief Complaint:   Chief Complaint   Establish Care; Depression; Hallucinations    Visit Diagnosis:    ICD-10-CM   1. Major depressive disorder with single episode, in full remission ([place])  F32.5 ARIPiprazole (ABILIFY) 2 MG tablet    mirtazapine (REMERON) 15 MG tablet    2. High risk medication use  Z79.899 Lipid panel    Hemoglobin A1C    Sodium    Prolactin    3. History of delirium  Z87.898     4. Elevated blood pressure reading  R03.0       History of Present Illness: Angela Mason is a 87 year old Caucasian female who has a history of MDD with psychosis, acute metabolic encephalopathy, hyperlipidemia, hypertension, hypothyroidism, hyponatremia, urinary tract infection, COPD, cystocele was evaluated in office today.  Patient was diagnosed with UTI on 10/01/2020, was started on cefuroxime , patient developed confusion, paranoia (her normal baseline as reported by patient is normally alert, oriented to independent).  Patient was admitted to the medical floor, was evaluated by psychiatric consult team.  I have reviewed notes per Dr.Paliy as well as Dr. Weber Cooks.  Patient was diagnosed with delirium as well as MDD.  Initially started on Seroquel 12.5-25 mg as needed.  And thereafter was initiated on Abilify 2 mg as well as mirtazapine 30 mg at bedtime.  Patient today was seen for the first time in our outpatient clinic.  Patient presented alone to the appointment although transportation was provided by [facility] senior living community.  She appeared to be alert, oriented to person place time and situation.  She was able to answer questions.  She was able to give the date the time.  She was able to answer correctly her address, her date of birth.  She was able to give a list of her medications and was able to tell the name of  some of her medications correctly and with the help of the list she was able to confirm her other medications.  She currently denies any significant depression although she feels sad on and off because of her current living situation, is not very happy with the fact that she cannot drive anymore and has to depend on the bus that they provide to do her grocery shopping which she is able to do.  She reports she cooks by herself.  Daughter helps out with managing the finances.  She denies any memory problems that are significant.  Patient completed an MMSE while in session today and scored 29 out of 30.  Memory seems to be intact.  Patient reports sleep is overall good.  She denied any suicidality or homicidality.  Patient reports a history of feeling paranoid, felt as though people were stalking her, talking about her after the diagnosis of UTI and even after discharge.  She however currently denies it.  Patient denies any visual or auditory hallucinations.  She did not appear to be preoccupied with any delusions, did not appear to be responding to internal stimuli.  Patient denies any significant anxiety symptoms.  Denies any history of trauma.  Denies any mood lability.  Patient denies any other concerns today.     Associated Signs/Symptoms: Depression Symptoms:   Sad on and off (Hypo) Manic Symptoms:   Denies Anxiety Symptoms:   Denies Psychotic Symptoms:   Denies PTSD Symptoms: Negative  Past Psychiatric History: Denies  past history of inpatient mental health admissions.  Patient had recent admission for UTI in March 2022 at that time was evaluated by psychiatric consult team for mental status change, diagnosed with delirium, MDD with psychosis.  Was started on Abilify, mirtazapine.  Previous Psychotropic Medications: Yes Abilify, mirtazapine, Seroquel as needed  Substance Abuse History in the last 12 months:  No.  Consequences of Substance Abuse: Negative  Past Medical  History:  Past Medical History:  Diagnosis Date   Cancer ([place])    SKIN   COPD, mild (HCC)    Dyspnea    Edema    HLD (hyperlipidemia)    HTN (hypertension)    Hypothyroidism    Macular degeneration, bilateral    Varicosities     Past Surgical History:  Procedure Laterality Date   CATARACT EXTRACTION W/PHACO Right 02/03/2017   Procedure: CATARACT EXTRACTION PHACO AND INTRAOCULAR LENS PLACEMENT ([place]);  Surgeon: Birder Robson, MD;  Location: [facility] ORS;  Service: Ophthalmology;  Laterality: Right;  [place] 00:57.2 AP% 18.5 CDE 10.58 Fluid pack lot # [id number]   CATARACT EXTRACTION W/PHACO Left 02/24/2017   Procedure: CATARACT EXTRACTION PHACO AND INTRAOCULAR LENS PLACEMENT (IOC);  Surgeon: Birder Robson, MD;  Location: [facility] ORS;  Service: Ophthalmology;  Laterality: Left;  [place] 01:12 AP% 25.0 CDE 18.03 Fluid pack lot # [id number]   MOHS SURGERY  2015   nose   SALIVARY GLAND SURGERY Left 2012   TONSILLECTOMY  1940    Family Psychiatric History: Denies history of mental health problems in her family  Family History:  Family History  Problem Relation Age of Onset   Diverticulitis Mother    Macular degeneration Mother    COPD Father    Heart failure Father    Epilepsy Sister    Parkinson's disease Brother    Cancer Neg Hx    Diabetes Neg Hx    Breast cancer Neg Hx     Social History:   Social History   Socioeconomic History   Marital status: Married    Spouse name: Not on file   Number of children: Not on file   Years of education: Not on file   Highest education level: Not on file  Occupational History   Not on file  Tobacco Use   Smoking status: Former    Types: Cigarettes    Quit date: 09/05/2005    Years since quitting: 15.5   Smokeless tobacco: Never  Vaping Use   Vaping Use: Never used  Substance and Sexual Activity   Alcohol use: Yes    Alcohol/week: 0.0 standard drinks    Comment: 1 qd   Drug use: No   Sexual activity: Not Currently    Birth  control/protection: Post-menopausal  Other Topics Concern   Not on file  Social History Narrative   Not on file   Social Determinants of Health   Financial Resource Strain: Not on file  Food Insecurity: Not on file  Transportation Needs: Not on file  Physical Activity: Not on file  Stress: Not on file  Social Connections: Not on file    Additional Social History: Patient was born and raised in [place] by both parents.  Patient has 1 sister who lives in [place], her brother passed away.  She graduated high school.  She had a good childhood.  She worked at [employer] previously and also had other jobs.  She currently is on SSI.  She is still married, her husband lives at a skilled nursing facility.  She currently lives at twin [facility] which [facility].  Her [place] however continues to visit her and support her.   Allergies:   Allergies  Allergen Reactions   Gentamicin    Levofloxacin Diarrhea    Metabolic Disorder Labs: No results found for: HGBA1C, MPG No results found for: PROLACTIN No results found for: CHOL, TRIG, HDL, CHOLHDL, VLDL, LDLCALC Lab Results  Component Value Date   TSH 0.657 10/04/2020    Therapeutic Level Labs: No results found for: LITHIUM No results found for: CBMZ No results found for: VALPROATE  Current Medications: Current Outpatient Medications  Medication Sig Dispense Refill   ARIPiprazole (ABILIFY) 2 MG tablet Take 1 tablet (2 mg total) by mouth daily. 30 tablet 1   atenolol (TENORMIN) 25 MG tablet Take 1 tablet (25 mg total) by mouth daily.     calcium-vitamin D (OSCAL WITH D) 500-200 MG-UNIT tablet Take 1 tablet by mouth 2 (two) times daily.     Coenzyme Q10 (CO Q 10 PO) Take 1 tablet by mouth daily.      conjugated estrogens (PREMARIN) vaginal cream Place 1 Applicatorful vaginally 2 (two) times a week. Begin with 1/2 gram vaginally daily x 2 weeks, then decrease to  twice weekly. 42.5 g 2   Ferrous Sulfate Dried (HIGH POTENCY IRON) 65 MG TABS Take by mouth.     lovastatin (MEVACOR) 20 MG tablet Take 20 mg by mouth at bedtime.     melatonin 5 MG TABS Take 1 tablet (5 mg total) by mouth every evening.  0   mirtazapine (REMERON) 15 MG tablet Take 1 tablet (15 mg total) by mouth at bedtime. Has enough supplies - report she is on 15 mg daily 30 tablet 1   Multiple Vitamins-Minerals (PRESERVISION AREDS) TABS Take 1 capsule by mouth 2 (two) times daily.     Omega-3 Fatty Acids (FISH OIL CONCENTRATE PO) Take 1 tablet by mouth daily.      tetrahydrozoline 0.05 % ophthalmic solution Place 1 drop into both eyes daily as needed (dry eyes).     No current facility-administered medications for this visit.    Musculoskeletal: Strength & Muscle Tone: within normal limits Gait & Station:  seated in a wheel chair Patient leans: Backward  Psychiatric Specialty Exam: Review of Systems  Psychiatric/Behavioral:  Negative for agitation, behavioral problems, confusion, decreased concentration, dysphoric mood, hallucinations, self-injury, sleep disturbance and suicidal ideas. The patient is not nervous/anxious and is not hyperactive.   All other systems reviewed and are negative.  Blood pressure (!) 155/78, pulse 67, temperature 98.5 F (36.9 C), temperature source Temporal.There is no height or weight on file to calculate BMI.  General Appearance: Casual  Eye Contact:  Fair  Speech:  Clear and Coherent  Volume:  Normal  Mood:  Euthymic  Affect:  Congruent  Thought Process:  Goal Directed and Descriptions of Associations: Intact  Orientation:  Full (Time, Place, and Person)  Thought Content:  Logical  Suicidal Thoughts:  No  Homicidal Thoughts:  No  Memory:  Immediate;   Fair Recent;   Fair Remote;   Fair  Judgement:  Fair  Insight:  Fair  Psychomotor Activity:  Normal  Concentration:  Concentration: Fair and Attention Span: Fair  Recall:  [place]: Fair  Akathisia:  No  Handed:  Right  AIMS (if indicated):  done  Assets:  Communication Skills Desire for [place]  ADL's:  Intact  Cognition: WNL  Sleep:  Fair   Screenings: [place] Office Visit from 03/05/2021 in [place] Total Score 0      [place] ED to Hosp-Admission (Discharged) from 10/04/2020 in [place]  C-SSRS RISK CATEGORY No Risk       Assessment and Plan: Zamoni Biedenbach is a 87 year old Caucasian female who lives at twin [facility] senior living community, has a history of multiple medical problems, including hypertension, hyperlipidemia, cystocele, rectocele, history of recent inpatient medical admission from 10/04/2020 - 10/16/2020 for UTI, hyponatremia, encephalopathy, AMS, was diagnosed with delirium as well as MDD with psychosis during that admission, was under the care of consultation and liaison psychiatric team, was started on medications.  Patient currently doing well, does not appear to have any psychosis, will benefit from the following plan. The patient demonstrates the following risk factors for suicide: Chronic risk factors for suicide include: psychiatric disorder of MDD, Delirium and medical illness acute metabolic encephalopathy . Acute risk factors for suicide include:  living situation . Protective factors for this patient include: positive social support, positive therapeutic relationship, coping skills, and religious beliefs against suicide. Considering these factors, the overall suicide risk at this point appears to be low. Patient is appropriate for outpatient follow up.   Plan MDD currently in remission Continue mirtazapine at the current dosage.  Patient reports she is currently taking 15 mg at bedtime Reduce the dose of Abilify to 2 mg p.o. daily. Long-term plan is to taper her off of it  completely.  High risk medication use-will order labs-sodium, prolactin, lipid panel, hemoglobin A1c.  Provided lab slip.  Attempted to contact [facility] to verify her medications-Per [place] she has always been on Abilify 5 mg.  However Abilify prescription was transferred out. [facility].  They have no record of her feeling medication at their pharmacy.  Patient however brought in a list that listed Abilify as 5 mg daily.   Reviewed notes per Dr. Max Sane -most recent inpatient medical admission-dated 10/04/2020 - 10/16/2020 as noted above.  Also reviewed psychiatric consultation liaison team notes-Dr. Clapacs as well as Dr. Tresea Mall as noted above.  For elevated blood pressure reading-patient will continue to follow-up with her primary care provider.  We will coordinate care.  Follow-up in clinic in 1 month in office.  This note was generated in part or whole with voice recognition software. Voice recognition is usually quite accurate but there are transcription errors that can and very often do occur. I apologize for any typographical errors that were not detected and corrected.     Ursula Alert, MD 8/10/20225:36 PM

## 2021-03-05 NOTE — Patient Instructions (Signed)
Your medications have been changed at this visit.  Start taking Abilify 2 mg daily at breakfast.  Abilify is also called aripiprazole. No longer on the aripiprazole/Abilify 5 mg daily.  Your dose has been reduced.  Continue the mirtazapine at the current dose you are on, no changes made.  Please get all the blood work-up-labs done.  Lab slip has been provided to you.  Please return to office in a month from now.  If you are not doing well please make a sooner appointment or get immediate help by calling 911.

## 2021-03-06 DIAGNOSIS — R030 Elevated blood-pressure reading, without diagnosis of hypertension: Secondary | ICD-10-CM | POA: Insufficient documentation

## 2021-03-06 DIAGNOSIS — I10 Essential (primary) hypertension: Secondary | ICD-10-CM | POA: Insufficient documentation

## 2021-03-20 ENCOUNTER — Telehealth: Payer: Self-pay | Admitting: Psychiatry

## 2021-03-20 NOTE — Telephone Encounter (Signed)
I have reviewed labs sent over by [facility]   BMP-BUN-elevated at 26 Sodium within normal limits at 140 Carbon dioxide-34-current RBC-low at 3.6 Otherwise within normal limits  Patient to follow up with primary care provider for abnormal labs.

## 2021-03-20 NOTE — Telephone Encounter (Signed)
I have reviewed labs dated 03/18/2021  Lipid panel-within normal limits  Hemoglobin A1c-5.1-within normal limits  Sodium-141-within normal limits  Prolactin-17.3-within normal limits

## 2021-04-03 ENCOUNTER — Ambulatory Visit (INDEPENDENT_AMBULATORY_CARE_PROVIDER_SITE_OTHER): Payer: Medicare Other | Admitting: Psychiatry

## 2021-04-03 ENCOUNTER — Encounter: Payer: Self-pay | Admitting: Psychiatry

## 2021-04-03 ENCOUNTER — Other Ambulatory Visit: Payer: Self-pay

## 2021-04-03 VITALS — BP 180/74 | HR 70 | Ht <= 58 in | Wt 81.0 lb

## 2021-04-03 DIAGNOSIS — I10 Essential (primary) hypertension: Secondary | ICD-10-CM

## 2021-04-03 DIAGNOSIS — F325 Major depressive disorder, single episode, in full remission: Secondary | ICD-10-CM | POA: Diagnosis not present

## 2021-04-03 MED ORDER — ARIPIPRAZOLE 2 MG PO TABS: 1.0000 mg | ORAL_TABLET | Freq: Every day | ORAL | 0 refills | Status: DC

## 2021-04-03 NOTE — Progress Notes (Signed)
[facility] MD OP Progress Note  04/03/2021 3:36 PM Angela Mason  MRN:  [id number]  Chief Complaint:  Chief Complaint   Follow-up; Anxiety; Hallucinations; Depression    HPI: Angela Mason is a 87 year old Caucasian female who has a history of MDD with psychosis, acute metabolic encephalopathy, hypertension, hyperlipidemia, hypothyroidism, hyponatremia, COPD, cystocele was evaluated in office today.  Patient today appeared to be alert, oriented to person place time and situation.  Patient was able to answer all questions appropriately.  Patient denies any significant sadness.  Patient reports appetite is good.  She reports sleep is good.  There are nights when she has to wake up after falling asleep to take her nighttime medications.  She is trying to find a schedule with that.  Patient denies side effects to medications.  She denies any hallucinations, did not appear to be preoccupied with any delusions.  Patient reports she is tolerating the lower dosage of Abilify well.  She is currently on 2 mg daily.  Memory seems to be intact.  3 minutes word recall, immediately and after 5 minutes 3 out of 3.  Patient does report lower back pain, on and off, likely from her pessary, agrees to get in touch with her OB/GYN for maintenance.  Patient with elevated blood pressure reading, blood pressure was repeated in session-continues to be elevated.  Patient to contact her primary care provider.    Visit Diagnosis:    ICD-10-CM   1. Major depressive disorder with single episode, in full remission ([place])  F32.5 ARIPiprazole (ABILIFY) 2 MG tablet    2. Elevated blood pressure reading in office with diagnosis of hypertension  I10       Past Psychiatric History: Reviewed past psychiatric history from progress note on 03/05/2021.  Past trials of Abilify, mirtazapine, Seroquel.  Past Medical History:  Past Medical History:  Diagnosis Date   Cancer ([place])    SKIN   COPD, mild (HCC)    Dyspnea     Edema    HLD (hyperlipidemia)    HTN (hypertension)    Hypothyroidism    Macular degeneration, bilateral    Varicosities     Past Surgical History:  Procedure Laterality Date   CATARACT EXTRACTION W/PHACO Right 02/03/2017   Procedure: CATARACT EXTRACTION PHACO AND INTRAOCULAR LENS PLACEMENT ([place]);  Surgeon: Birder Robson, MD;  Location: [facility] ORS;  Service: Ophthalmology;  Laterality: Right;  [place] 00:57.2 AP% 18.5 CDE 10.58 Fluid pack lot # [id number]   CATARACT EXTRACTION W/PHACO Left 02/24/2017   Procedure: CATARACT EXTRACTION PHACO AND INTRAOCULAR LENS PLACEMENT ([facility]);  Surgeon: Birder Robson, MD;  Location: [facility] ORS;  Service: Ophthalmology;  Laterality: Left;  [place] 01:12 AP% 25.0 CDE 18.03 Fluid pack lot # [id number]   MOHS SURGERY  2015   nose   SALIVARY GLAND SURGERY Left 2012   TONSILLECTOMY  1940    Family Psychiatric History: Reviewed family psychiatric history from progress note on 03/05/2021.  Family History:  Family History  Problem Relation Age of Onset   Diverticulitis Mother    Macular degeneration Mother    COPD Father    Heart failure Father    Epilepsy Sister    Parkinson's disease Brother    Cancer Neg Hx    Diabetes Neg Hx    Breast cancer Neg Hx     Social History: Reviewed social history from progress note on 03/05/2021. Social History   Socioeconomic History   Marital status: Married    Spouse name: Not on file  Number of children: Not on file   Years of education: Not on file   Highest education level: Not on file  Occupational History   Not on file  Tobacco Use   Smoking status: Former    Types: Cigarettes    Quit date: 09/05/2005    Years since quitting: 15.5   Smokeless tobacco: Never  Vaping Use   Vaping Use: Never used  Substance and Sexual Activity   Alcohol use: Yes    Alcohol/week: 0.0 standard drinks    Comment: 1 qd   Drug use: No   Sexual activity: Not Currently    Birth control/protection: Post-menopausal  Other Topics  Concern   Not on file  Social History Narrative   Not on file   Social Determinants of Health   Financial Resource Strain: Not on file  Food Insecurity: Not on file  Transportation Needs: Not on file  Physical Activity: Not on file  Stress: Not on file  Social Connections: Not on file    Allergies:  Allergies  Allergen Reactions   Gentamicin    Levofloxacin Diarrhea    Metabolic Disorder Labs: No results found for: HGBA1C, MPG No results found for: PROLACTIN No results found for: CHOL, TRIG, HDL, CHOLHDL, VLDL, LDLCALC Lab Results  Component Value Date   TSH 0.657 10/04/2020    Therapeutic Level Labs: No results found for: LITHIUM No results found for: VALPROATE No components found for:  CBMZ  Current Medications: Current Outpatient Medications  Medication Sig Dispense Refill   atenolol (TENORMIN) 25 MG tablet Take 1 tablet (25 mg total) by mouth daily.     calcium-vitamin D (OSCAL WITH D) 500-200 MG-UNIT tablet Take 1 tablet by mouth 2 (two) times daily.     Coenzyme Q10 (CO Q 10 PO) Take 1 tablet by mouth daily.      conjugated estrogens (PREMARIN) vaginal cream Place 1 Applicatorful vaginally 2 (two) times a week. Begin with 1/2 gram vaginally daily x 2 weeks, then decrease to twice weekly. 42.5 g 2   Ferrous Sulfate Dried (HIGH POTENCY IRON) 65 MG TABS Take by mouth.     lovastatin (MEVACOR) 20 MG tablet Take 20 mg by mouth at bedtime.     melatonin 5 MG TABS Take 1 tablet (5 mg total) by mouth every evening.  0   mirtazapine (REMERON) 15 MG tablet Take 1 tablet (15 mg total) by mouth at bedtime. Has enough supplies - report she is on 15 mg daily 30 tablet 1   Multiple Vitamins-Minerals (PRESERVISION AREDS) TABS Take 1 capsule by mouth 2 (two) times daily.     Omega-3 Fatty Acids (FISH OIL CONCENTRATE PO) Take 1 tablet by mouth daily.      tetrahydrozoline 0.05 % ophthalmic solution Place 1 drop into both eyes daily as needed (dry eyes).     ARIPiprazole  (ABILIFY) 2 MG tablet Take 0.5 tablets (1 mg total) by mouth daily. 15 tablet 0   No current facility-administered medications for this visit.     Musculoskeletal: Strength & Muscle Tone: within normal limits Gait & Station:  wheel chair Patient leans: Backward  Psychiatric Specialty Exam: Review of Systems  Musculoskeletal:        Low back pain on and off - denies today  All other systems reviewed and are negative.  Blood pressure (!) 180/74, pulse 70, height '4\' 10"'$  (1.473 m), weight 81 lb (36.7 kg), SpO2 98 %.Body mass index is 16.93 kg/m.  General Appearance: Casual  Eye  Contact:  Fair  Speech:  Clear and Coherent  Volume:  Normal  Mood:  Euthymic  Affect:  Congruent  Thought Process:  Goal Directed and Descriptions of Associations: Intact  Orientation:  Full (Time, Place, and Person)  Thought Content: Logical   Suicidal Thoughts:  No  Homicidal Thoughts:  No  Memory:  Immediate;   Fair Recent;   Fair Remote;   limited  Judgement:  Fair  Insight:  Fair  Psychomotor Activity:  Normal  Concentration:  Concentration: Fair and Attention Span: Fair  Recall:  [employer] of Knowledge: Fair  Language: Fair  Akathisia:  No  Handed:  Right  AIMS (if indicated): done  Assets:  Communication Skills Desire for Improvement Housing Social Support  ADL's:  Intact  Cognition: WNL  Sleep:  Fair   Screenings: [place] Office Visit from 03/05/2021 in [place] Total Score 0      PHQ2-9    [place] Office Visit from 04/03/2021 in [facility]  PHQ-2 Total Score 0  PHQ-9 Total Score 0      [place] ED to Hosp-Admission (Discharged) from 10/04/2020 in [place] CATEGORY No Risk        Assessment and Plan: Angela Mason is a 87 year old Caucasian female who lives at twin [facility] senior living community, has a history of MDD, multiple  medical problems including hypertension, hyperlipidemia, cystocele, rectocele, history of recent inpatient medical admission in March 2022 for UTI, hyponatremia, encephalopathy, AMS was diagnosed with delirium as well as MDD with psychosis.  Patient had recent dose reduction of Abilify, currently tolerating it well, denies any worsening mood symptoms, denies any perceptual disturbances.  Patient with elevated blood pressure in session today will benefit from management of the same.  Discussed plan as noted below.  Plan MDD in remission Continue mirtazapine 15 mg p.o. nightly We will reduce Abilify to 1 milligram p.o. daily. Long-term plan is to taper her off of it completely. AIMS - 0  Elevated blood pressure reading-unstable Blood pressure was repeated in session today-elevated. Provided education. We will coordinate care with primary care provider.  Patient will need evaluation and management of the same. Reviewed notes per OB/GYN-Dr. Cherry-dated 02/26/2021-patient had appointment for pessary change , has a history of vaginal atrophy.  Genitourinary syndrome of menopause, cystocele and rectocele.  Patient has upcoming appointment with OB/GYN.  Patient to discuss her low back pain, unknown if this is due to the pessary.  Follow-up in clinic in 1 month.    This note was generated in part or whole with voice recognition software. Voice recognition is usually quite accurate but there are transcription errors that can and very often do occur. I apologize for any typographical errors that were not detected and corrected.      Ursula Alert, MD 04/03/2021, 3:36 PM

## 2021-04-03 NOTE — Patient Instructions (Addendum)
STOP TAKING ARIPiprazole (ABILIFY) 2 MG DAILY.  START TAKING ARIPiprazole (ABILIFY) 1 MG DAILY, HALF TABLET OF 2 MG .

## 2021-05-01 ENCOUNTER — Ambulatory Visit: Payer: Medicare Other | Admitting: Psychiatry

## 2021-05-02 ENCOUNTER — Ambulatory Visit: Payer: Medicare Other | Admitting: Psychiatry

## 2021-05-08 ENCOUNTER — Encounter: Payer: Self-pay | Admitting: Psychiatry

## 2021-05-08 ENCOUNTER — Ambulatory Visit (INDEPENDENT_AMBULATORY_CARE_PROVIDER_SITE_OTHER): Payer: Medicare Other | Admitting: Psychiatry

## 2021-05-08 ENCOUNTER — Other Ambulatory Visit: Payer: Self-pay

## 2021-05-08 ENCOUNTER — Telehealth: Payer: Self-pay | Admitting: Obstetrics and Gynecology

## 2021-05-08 VITALS — BP 170/82 | HR 67 | Temp 98.5°F

## 2021-05-08 DIAGNOSIS — F325 Major depressive disorder, single episode, in full remission: Secondary | ICD-10-CM | POA: Diagnosis not present

## 2021-05-08 DIAGNOSIS — I10 Essential (primary) hypertension: Secondary | ICD-10-CM

## 2021-05-08 MED ORDER — MIRTAZAPINE 15 MG PO TABS: 15.0000 mg | ORAL_TABLET | Freq: Every day | ORAL | 1 refills | Status: DC

## 2021-05-08 MED ORDER — PREMARIN 0.625 MG/GM VA CREA: TOPICAL_CREAM | VAGINAL | 2 refills | Status: DC

## 2021-05-08 MED ORDER — ARIPIPRAZOLE 2 MG PO TABS: 1.0000 mg | ORAL_TABLET | ORAL | 0 refills | Status: DC

## 2021-05-08 NOTE — Telephone Encounter (Signed)
Angela Mason called in and would like PREMARIN called in to [facility] in [place], [place]

## 2021-05-08 NOTE — Telephone Encounter (Signed)
Premarin cream sent to [facility]. Patient called. Patient aware.

## 2021-05-08 NOTE — Progress Notes (Signed)
[facility] MD OP Progress Note  05/08/2021 3:08 PM Kale Dols  MRN:  [id number]  Chief Complaint:  Chief Complaint   Follow-up; Depression    HPI: Angela Mason is a 87 year old Caucasian female who has a history of MDD with psychosis, acute metabolic encephalopathy, hypertension, hyperlipidemia, hypothyroidism, hyponatremia, COPD, cystocele was evaluated in office today.  Patient today appeared to be pleasant, cooperative.  She reports she is currently doing well on the lower dose of Abilify.  She does have psychosocial stressors of having to deal with a lot of day-to-day situations and chores by herself which can be stressful.  That does make her anxious however she has been coping okay.  Last night she did not sleep that well since she was asked by a friend about cleaning up all the 'stuff' at home that she had acquired over the past several years.  That kind of made her nervous.  She reports otherwise she has no problems sleeping.  She is able to take the mirtazapine and that helps her to sleep through the night.  Patient appeared to be alert, oriented to person place time and situation.  Patient denies any suicidality, homicidality or perceptual disturbances.  Did not appear to be preoccupied with any delusions.  Patient denies any other concerns today.  Visit Diagnosis:    ICD-10-CM   1. Major depressive disorder with single episode, in full remission ([place])  F32.5 ARIPiprazole (ABILIFY) 2 MG tablet    mirtazapine (REMERON) 15 MG tablet    2. Elevated blood pressure reading in office with diagnosis of hypertension  I10       Past Psychiatric History: Reviewed past psychiatric history from progress note on 03/05/2021.  Past trials of Abilify, mirtazapine, Seroquel  Past Medical History:  Past Medical History:  Diagnosis Date   Cancer ([place])    SKIN   COPD, mild (HCC)    Dyspnea    Edema    HLD (hyperlipidemia)    HTN (hypertension)    Hypothyroidism    Macular  degeneration, bilateral    Varicosities     Past Surgical History:  Procedure Laterality Date   CATARACT EXTRACTION W/PHACO Right 02/03/2017   Procedure: CATARACT EXTRACTION PHACO AND INTRAOCULAR LENS PLACEMENT ([place]);  Surgeon: Birder Robson, MD;  Location: [facility] ORS;  Service: Ophthalmology;  Laterality: Right;  [place] 00:57.2 AP% 18.5 CDE 10.58 Fluid pack lot # [id number]   CATARACT EXTRACTION W/PHACO Left 02/24/2017   Procedure: CATARACT EXTRACTION PHACO AND INTRAOCULAR LENS PLACEMENT ([facility]);  Surgeon: Birder Robson, MD;  Location: [facility] ORS;  Service: Ophthalmology;  Laterality: Left;  [place] 01:12 AP% 25.0 CDE 18.03 Fluid pack lot # [id number]   MOHS SURGERY  2015   nose   SALIVARY GLAND SURGERY Left 2012   TONSILLECTOMY  1940    Family Psychiatric History: Reviewed family psychiatric history from progress note on 03/05/2021  Family History:  Family History  Problem Relation Age of Onset   Diverticulitis Mother    Macular degeneration Mother    COPD Father    Heart failure Father    Epilepsy Sister    Parkinson's disease Brother    Cancer Neg Hx    Diabetes Neg Hx    Breast cancer Neg Hx     Social History: Reviewed social history from progress note on 03/05/2021 Social History   Socioeconomic History   Marital status: Married    Spouse name: Not on file   Number of children: Not on file   Years of education:  Not on file   Highest education level: Not on file  Occupational History   Not on file  Tobacco Use   Smoking status: Former    Types: Cigarettes    Quit date: 09/05/2005    Years since quitting: 15.6   Smokeless tobacco: Never  Vaping Use   Vaping Use: Never used  Substance and Sexual Activity   Alcohol use: Yes    Alcohol/week: 0.0 standard drinks    Comment: 1 qd   Drug use: No   Sexual activity: Not Currently    Birth control/protection: Post-menopausal  Other Topics Concern   Not on file  Social History Narrative   Not on file   Social Determinants  of Health   Financial Resource Strain: Not on file  Food Insecurity: Not on file  Transportation Needs: Not on file  Physical Activity: Not on file  Stress: Not on file  Social Connections: Not on file    Allergies:  Allergies  Allergen Reactions   Gentamicin    Levofloxacin Diarrhea    Metabolic Disorder Labs: No results found for: HGBA1C, MPG No results found for: PROLACTIN No results found for: CHOL, TRIG, HDL, CHOLHDL, VLDL, LDLCALC Lab Results  Component Value Date   TSH 0.657 10/04/2020    Therapeutic Level Labs: No results found for: LITHIUM No results found for: VALPROATE No components found for:  CBMZ  Current Medications: Current Outpatient Medications  Medication Sig Dispense Refill   atenolol (TENORMIN) 25 MG tablet Take 1 tablet (25 mg total) by mouth daily.     calcium-vitamin D (OSCAL WITH D) 500-200 MG-UNIT tablet Take 1 tablet by mouth 2 (two) times daily.     Coenzyme Q10 (CO Q 10 PO) Take 1 tablet by mouth daily.      Ferrous Sulfate Dried (HIGH POTENCY IRON) 65 MG TABS Take by mouth.     lovastatin (MEVACOR) 20 MG tablet Take 20 mg by mouth at bedtime.     melatonin 5 MG TABS Take 1 tablet (5 mg total) by mouth every evening.  0   Multiple Vitamins-Minerals (PRESERVISION AREDS) TABS Take 1 capsule by mouth 2 (two) times daily.     Omega-3 Fatty Acids (FISH OIL CONCENTRATE PO) Take 1 tablet by mouth daily.      tetrahydrozoline 0.05 % ophthalmic solution Place 1 drop into both eyes daily as needed (dry eyes).     ARIPiprazole (ABILIFY) 2 MG tablet Take 0.5 tablets (1 mg total) by mouth as directed. Start taking every other day for 10 doses and stop taking 10 tablet 0   conjugated estrogens (PREMARIN) vaginal cream Place vaginally 2 (two) times a week. 42.5 g 2   mirtazapine (REMERON) 15 MG tablet Take 1 tablet (15 mg total) by mouth at bedtime. Has enough supplies - report she is on 15 mg daily 30 tablet 1   No current facility-administered  medications for this visit.     Musculoskeletal: Strength & Muscle Tone:  wheelchair bound Gait & Station:  wheelchair bound Patient leans: Backward  Psychiatric Specialty Exam: Review of Systems  Psychiatric/Behavioral:  Negative for agitation, behavioral problems, confusion, decreased concentration, dysphoric mood, hallucinations, self-injury, sleep disturbance and suicidal ideas. The patient is not nervous/anxious and is not hyperactive.   All other systems reviewed and are negative.  Blood pressure (!) 170/82, pulse 67, temperature 98.5 F (36.9 C), temperature source Temporal.There is no height or weight on file to calculate BMI.  General Appearance: Casual  Eye Contact:  Fair  Speech:  Clear and Coherent  Volume:  Normal  Mood:  Euthymic  Affect:  Congruent  Thought Process:  Goal Directed and Descriptions of Associations: Intact  Orientation:  Full (Time, Place, and Person)  Thought Content: Logical   Suicidal Thoughts:  No  Homicidal Thoughts:  No  Memory:  Immediate;   Fair Recent;   Fair Remote;   Fair  Judgement:  Fair  Insight:  Fair  Psychomotor Activity:  Normal  Concentration:  Concentration: Fair and Attention Span: Fair  Recall:  [employer] of Knowledge: Fair  Language: Fair  Akathisia:  No  Handed:  Right  AIMS (if indicated): done  Assets:  Communication Skills Desire for Improvement Housing Social Support  ADL's:  Intact  Cognition: WNL  Sleep:  Fair overall except last night as noted above   Screenings: [place] Office Visit from 03/05/2021 in [place] Total Score 0      PHQ2-9    [place] Visit from 04/03/2021 in [place]  PHQ-2 Total Score 0  PHQ-9 Total Score 0      [place] [facility] to Hosp-Admission (Discharged) from 10/04/2020 in [place] No Risk        Assessment and  Plan: Alanta Scobey is a 87 year old Caucasian female who lives at [facility] senior living community, has a history of MDD, multiple medical problems including hypertension, hyperlipidemia, cystocele, rectocele, history of recent inpatient medical admission in March 2022 for UTI, hyponatremia, encephalopathy, AMS was diagnosed with delirium as well as MDD with psychosis.  Patient is currently stable, tolerating Abilify taper well.  Discussed plan as noted below.  Plan MDD in remission Mirtazapine 15 mg p.o. nightly We will reduce Abilify to 1 mg p.o. every other day for 10 doses and stop taking it. AIMS - 0  Elevated blood pressure reading-unstable Patient to follow up with her primary care provider.  Provided education.  Follow-up in clinic as needed.  This note was generated in part or whole with voice recognition software. Voice recognition is usually quite accurate but there are transcription errors that can and very often do occur. I apologize for any typographical errors that were not detected and corrected.    Ursula Alert, MD 05/09/2021, 9:35 AM

## 2021-05-17 ENCOUNTER — Telehealth: Payer: Self-pay | Admitting: Obstetrics and Gynecology

## 2021-05-17 NOTE — Telephone Encounter (Signed)
Premarin cost to much and pt can not afford it. Pt is requesting something else to be called in. Please advise. Thanks [employer]

## 2021-05-17 NOTE — Telephone Encounter (Signed)
Spoke to concerning her call to the office. Pt stated that she was unable to afford the premarin cream and requested something cheaper. Informed pt that we are unsure of the cost of medication and how much they insurance will pay or cover. Pt was offered a sample of premarin cream until [facility] returns on Tuesday. Pt stated having transportation issues and unsure if she would be able to come by today to pick it up, but she would try. Sample placed at front desk for pick up.

## 2021-05-17 NOTE — Telephone Encounter (Signed)
Patient called and stated that she went to pick up her prescription (Premarin) and it's too expensive.  Is there another cream that can be called in

## 2021-05-20 MED ORDER — ESTRADIOL 0.1 MG/GM VA CREA: TOPICAL_CREAM | VAGINAL | 12 refills | Status: DC

## 2021-05-20 NOTE — Telephone Encounter (Signed)
Can try Estrace cream (same prescription signature). Also, can see if [facility] has any discounts for either cream.

## 2021-05-20 NOTE — Addendum Note (Signed)
Addended by: Edwyna Shell on: 05/20/2021 02:37 PM   Modules accepted: Orders

## 2021-05-20 NOTE — Telephone Encounter (Signed)
No answer LM on VM. Informed pt of the name of the medication Estrace that would sent in to replace premarin. [facility]. Informed pt if that medication was too much please let us know. Also if the medication was cheaper at another pharmacy she could have the medication transferred there.

## 2021-05-23 NOTE — Telephone Encounter (Signed)
Pt called no answer LM on voicemail stating that [facility] sent in Estrace vaginal cream. To please contact office if that medication is too much and not covered by insurance. Explained to pt on VM that we are unsure of how much medication will cost until the insurance processes it and apologized for any inconvenience.

## 2021-05-23 NOTE — Telephone Encounter (Signed)
Pt called in stating that she was confused about all of the messages that was sent to her. Pt was informed that I talked to her last week 05/17/2021 and informed her that samples of premarin would be place at the first desk for her to pick up and I would speak to [facility] about sending in another form of cream that maybe cheaper. Pt at that time voiced that she understood and would pick up the medication on Monday. I called the pt on 05/20/2021 no answer LM that I had spoke to [facility] and she suggested trying estrace then premarin and it would be cheaper. Today I called pt again to make sure she received the first message no answer left another vm stating the same as the first. Pt called back stating that she was confused because she was getting so many calls from different people at the office and she talked to someone on Friday and picked up the samples on Monday and not sure why she keeps getting calls. Pt was reminded that she samples were small and enough to cover her until [facility] was able to send in something else. Pt stated that she has the samples and do not need an rx and why do we keep calling her. Pt was informed that she samples were mini size and was just enough for maybe a week. Pt started yelling and stating that she received regular size large tubes and do not need the other rx and confused at why the medication was sent in the pharmacy. I reminded pt that she had called the office stating that the Premarin cream was too expensive and she could not afford it. Pt stated that she did but now that she has the sample she does need it. Pt stated that I was confusing her along with all of the other people calling the office talking about the cream. I tried to inform the pt that I was the only one calling and was just following up with her request.

## 2021-05-29 ENCOUNTER — Ambulatory Visit (INDEPENDENT_AMBULATORY_CARE_PROVIDER_SITE_OTHER): Payer: Medicare Other | Admitting: Obstetrics and Gynecology

## 2021-05-29 ENCOUNTER — Other Ambulatory Visit: Payer: Self-pay

## 2021-05-29 ENCOUNTER — Encounter: Payer: Self-pay | Admitting: Obstetrics and Gynecology

## 2021-05-29 VITALS — BP 193/92 | HR 63 | Resp 16 | Ht 58.5 in | Wt 80.9 lb

## 2021-05-29 DIAGNOSIS — I10 Essential (primary) hypertension: Secondary | ICD-10-CM | POA: Diagnosis not present

## 2021-05-29 DIAGNOSIS — N952 Postmenopausal atrophic vaginitis: Secondary | ICD-10-CM

## 2021-05-29 DIAGNOSIS — Z4689 Encounter for fitting and adjustment of other specified devices: Secondary | ICD-10-CM

## 2021-05-29 DIAGNOSIS — N813 Complete uterovaginal prolapse: Secondary | ICD-10-CM | POA: Diagnosis not present

## 2021-05-29 NOTE — Progress Notes (Signed)
    GYNECOLOGY PROGRESS NOTE  Subjective:    [name], female    DOB: 17-Oct-1932, 87 y.o.   MRN: [id number]  HPI  Patient is a 87 y.o. G16P1001 female who presents for pessary check. She has a history of cystocele, rectocele, complete procidentia, and incomplete bladder emptying.  Has been noting some light bleeding intermittently. No longer able to get Estrace cream due to cost ($400).       The following portions of the patient's history were reviewed and updated as appropriate: allergies, current medications, past family history, past medical history, past social history, past surgical history and problem list.   The following portions of the patient's history were reviewed and updated as appropriate: allergies, current medications, past family history, past medical history, past social history, past surgical history, and problem list.  Review of Systems Pertinent items noted in HPI and remainder of comprehensive ROS otherwise negative.   Objective:   Blood pressure (!) 193/92, pulse 63, resp. rate 16, height 4' 10.5" (1.486 m), weight 80 lb 14.4 oz (36.7 kg). Body mass index is 16.62 kg/m. General appearance: alert and no distress Abdomen: soft, non-tender; bowel sounds normal; no masses,  no organomegaly Pelvic: the patient's Size 3 ring with support pessary was removed, replaced with a size 2  3/4 ring with support pessary without complications. External exam appears normal.  Speculum examination revealed mild to moderate atrophic vaginal mucosa, scant blood in vaginal vault, no lesions.   Assessment:   1. Pessary maintenance   2. Vaginal atrophy   3. 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 Essential hypertension       Plan:   - Will give samples of  Will f/u in 3 months for pessary check.  - Will transition back to using Trimo-san gel for pessary maintenance. Given samples of Premarin cream as well.  - Uncontrolled HTN noted  today. Patient asymptomatic, took meds this morning. Notes increased stress recently with insurance issues. Has an appointment with PCP in a few weeks. Notes it is not usually as high in his office.     Rubie Maid, MD [facility]

## 2021-06-14 ENCOUNTER — Telehealth: Payer: Self-pay | Admitting: Obstetrics and Gynecology

## 2021-06-14 ENCOUNTER — Other Ambulatory Visit: Payer: Self-pay

## 2021-06-14 MED ORDER — OXYQUINOLONE SULFATE 0.025 % VA GEL: 1.0000 "application " | VAGINAL | 0 refills | Status: DC

## 2021-06-14 NOTE — Telephone Encounter (Signed)
Pt is calling in stating that she would like to move forward with taking the Trimo-san due to the other medication was too expensive for her.  Pt also stated that she would like to know if she should follow the instructions that is in the sample box or how would Dr. Marcelline Mates like for her to use it.  Pt would like to have a call back.

## 2021-06-14 NOTE — Telephone Encounter (Signed)
Medication Trimo-San sent to [facility]. Pt called no answer LM that her medication has been sent to pharmacy [facility] and to use a pea size amount in the vaginal twice a week.

## 2021-07-05 IMAGING — MG MM DIGITAL SCREENING BILAT W/ TOMO AND CAD
8 series · 9 of 24 positions shown · non-contrast
Comparison: Previous exam(s).

CLINICAL DATA: Screening.

EXAM:
DIGITAL SCREENING BILATERAL MAMMOGRAM WITH TOMOSYNTHESIS AND CAD
TECHNIQUE: Bilateral screening digital craniocaudal and mediolateral oblique
mammograms were obtained. Bilateral screening digital breast
tomosynthesis was performed. The images were evaluated with
computer-aided detection.

[L CC synth-2D]
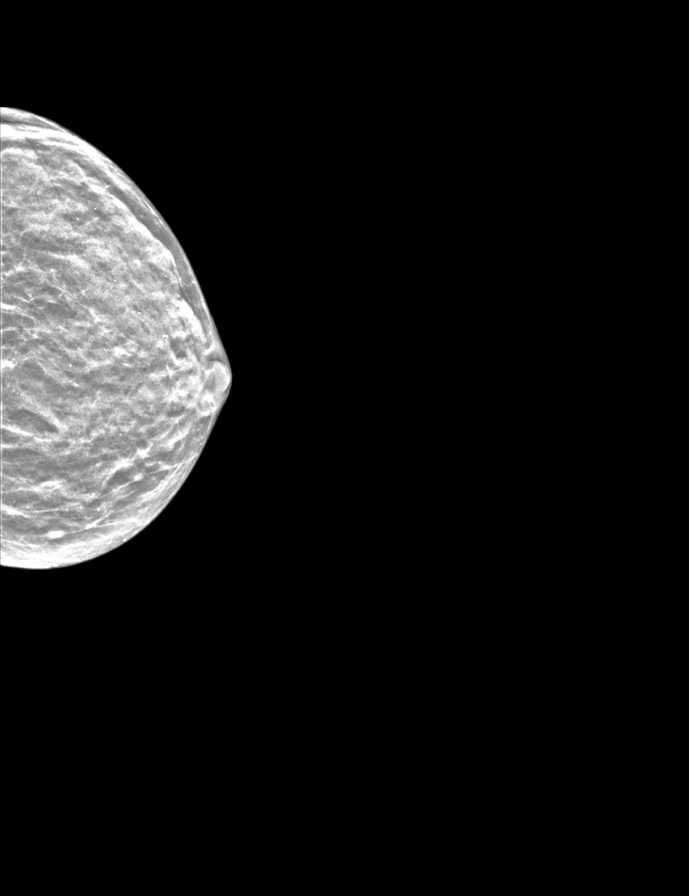

[L MLO synth-2D]
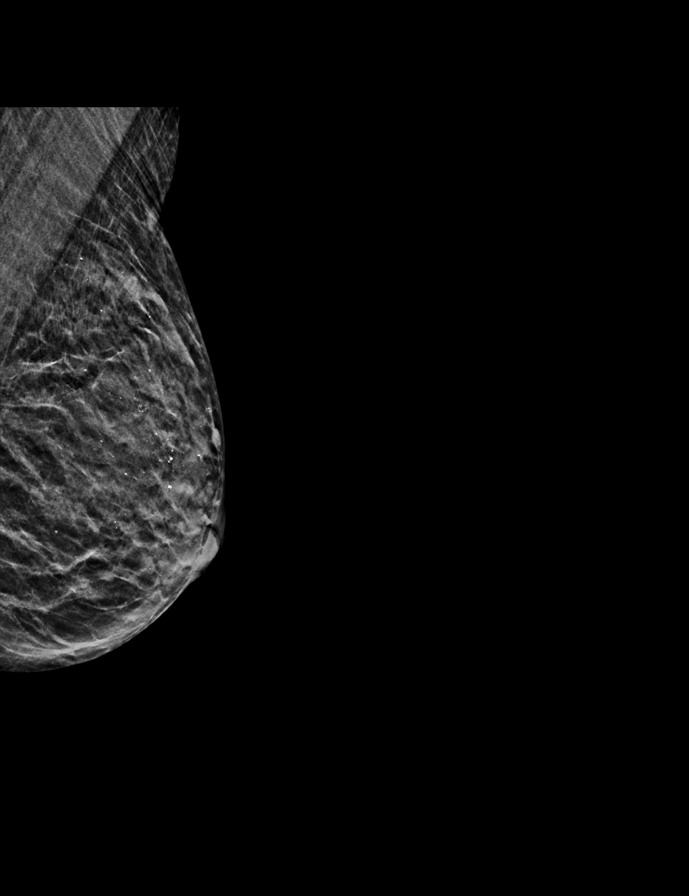

[R CC synth-2D]
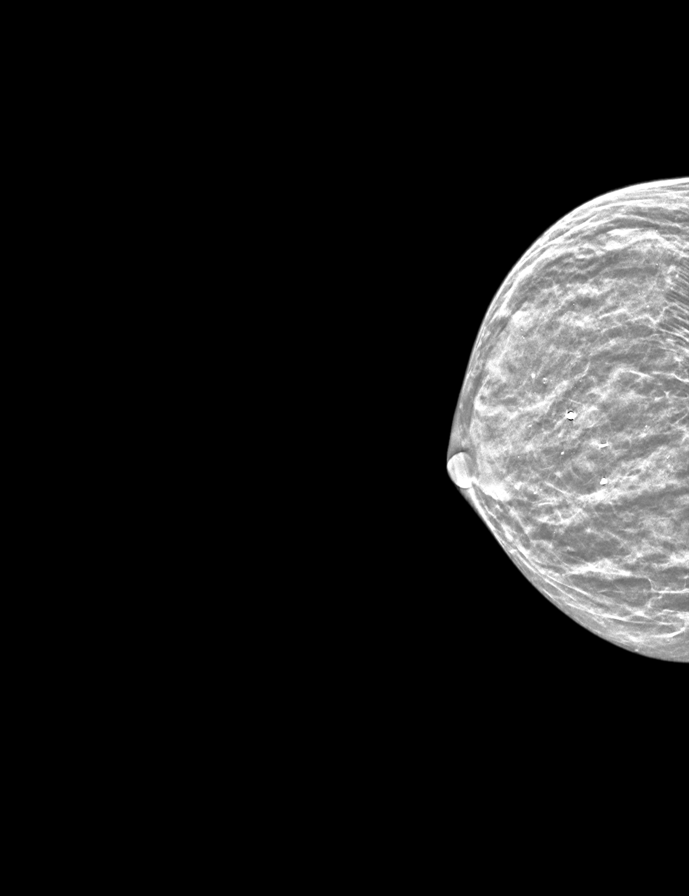

[R MLO synth-2D]
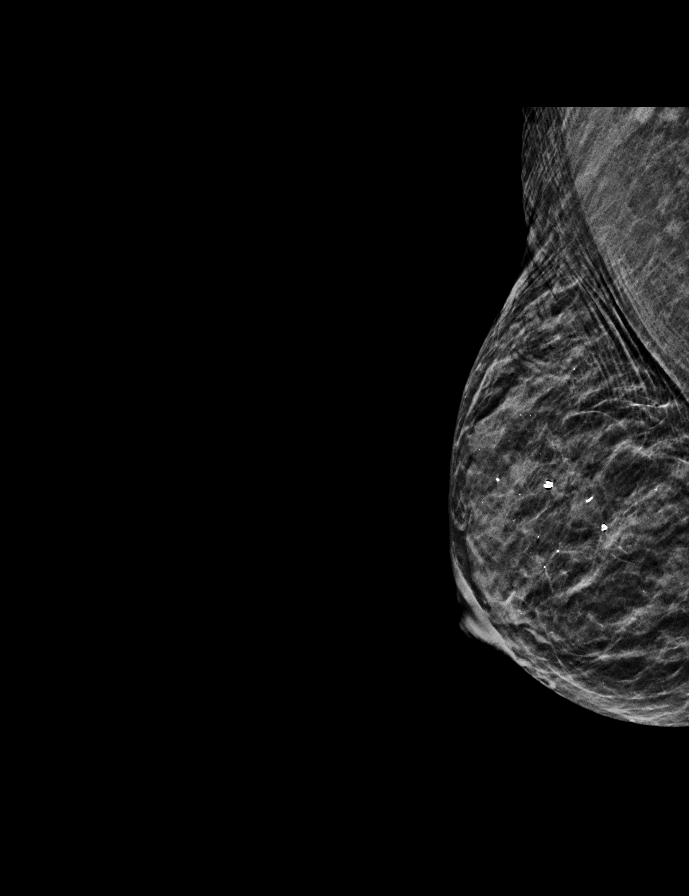

[R CC tomo · 2 of 25 frames shown]
[frame 9/25]
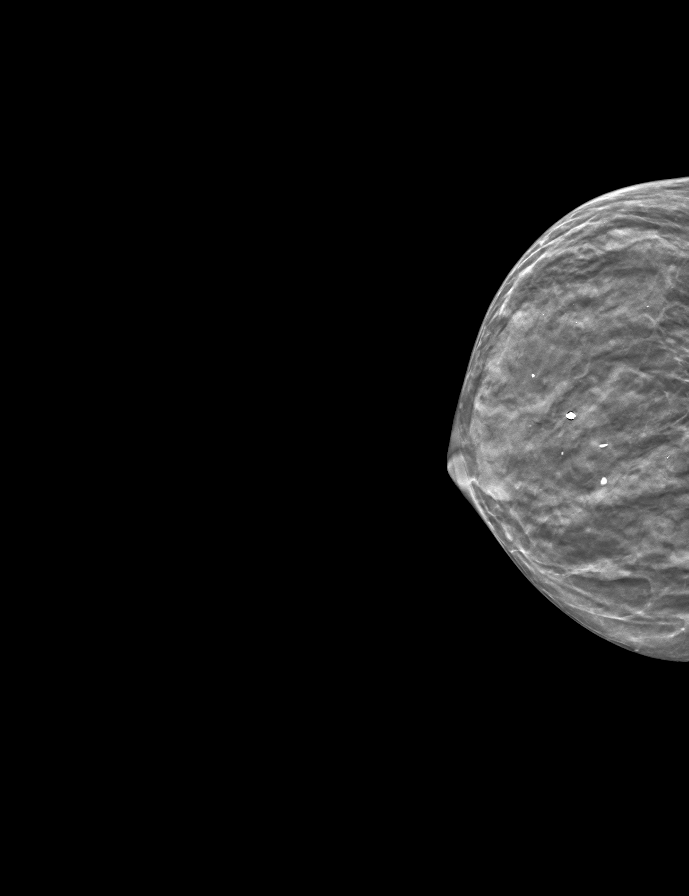
[frame 13/25]
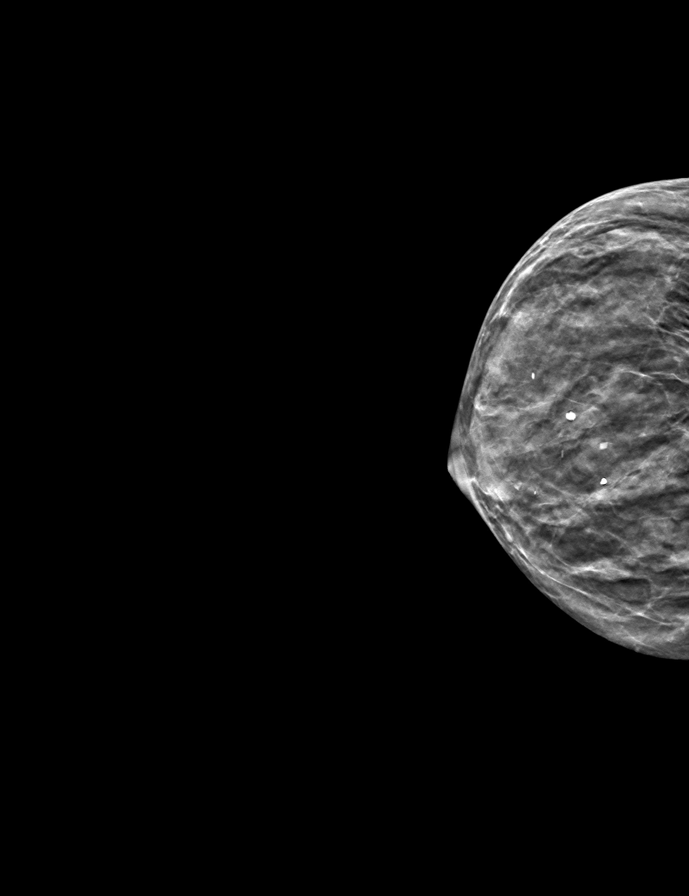

[L MLO tomo · tomo slice 13/26.0]
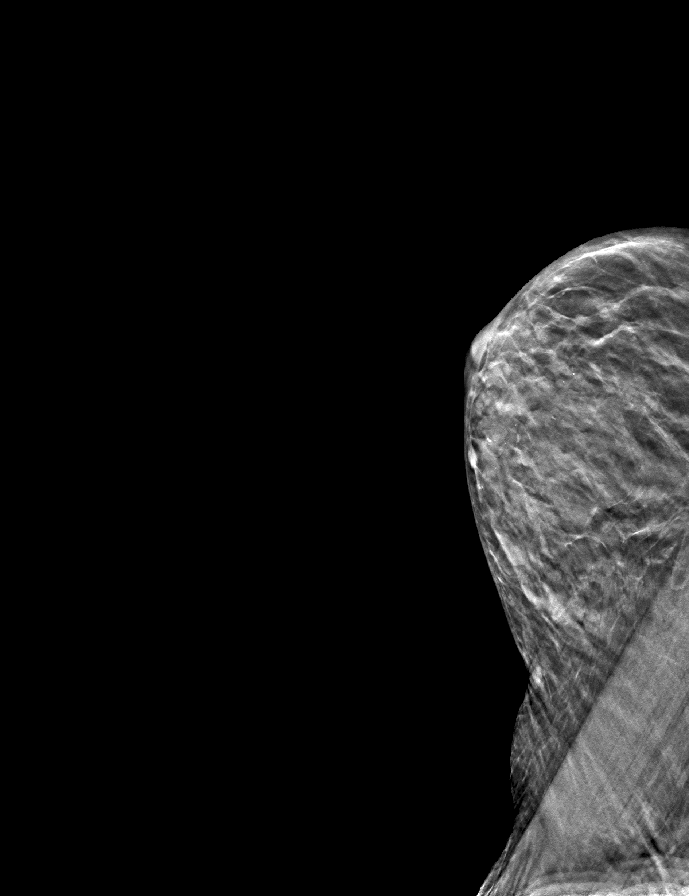

[R MLO tomo · tomo slice 13/25.0]
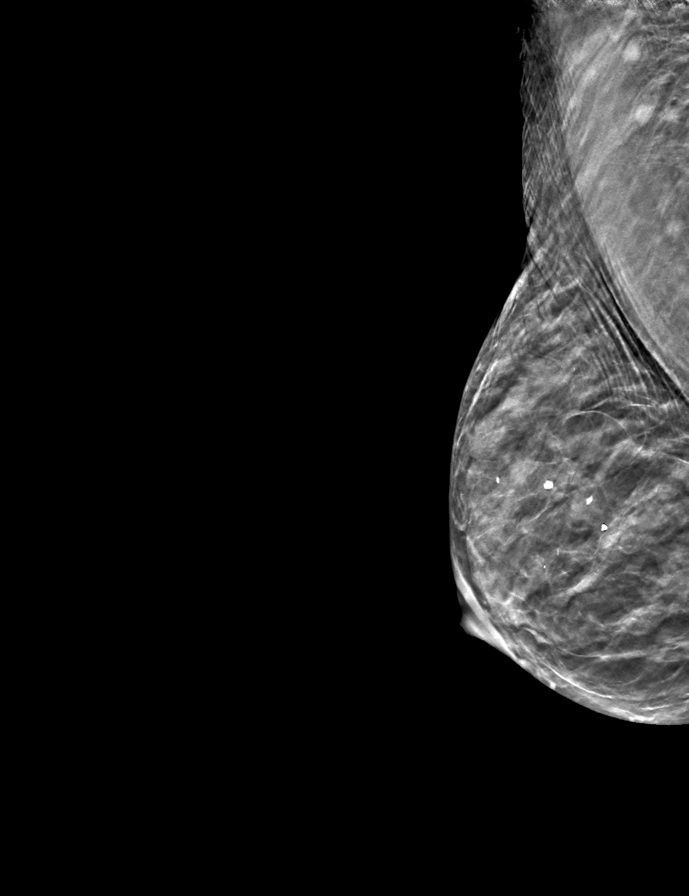

[L CC tomo · tomo slice 14/27.0]
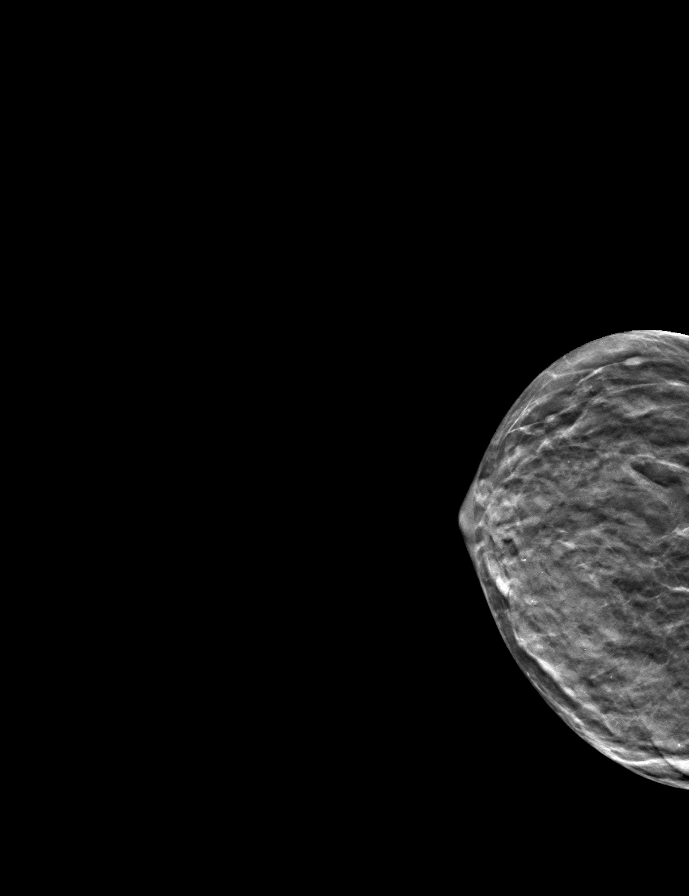

[9 of 24 positions shown; findings below may reference images not displayed]

ACR Breast Density Category d: The breast tissue is extremely dense,
which lowers the sensitivity of mammography
FINDINGS: There are no findings suspicious for malignancy. The images were
evaluated with computer-aided detection.
IMPRESSION: No mammographic evidence of malignancy. A result letter of this
screening mammogram will be mailed directly to the patient.

RECOMMENDATION:
Screening mammogram in one year. (Code:[id number])

BI-RADS CATEGORY  1: Negative.

## 2021-07-15 ENCOUNTER — Telehealth: Payer: Self-pay

## 2021-07-15 NOTE — Telephone Encounter (Signed)
pt called states that she is having alot of anxiety and can't think straight.  wants to know if you want her to go back on previous medicaiton or something new.

## 2021-07-15 NOTE — Telephone Encounter (Signed)
Please call patient and schedule an appointment for follow up ASAP . My 10:30 AM slot is open tomorrow since that patient just cancelled. Please put her on that slot if possible - in person or virtual.

## 2021-07-16 ENCOUNTER — Other Ambulatory Visit: Payer: Self-pay

## 2021-07-16 ENCOUNTER — Encounter: Payer: Self-pay | Admitting: Psychiatry

## 2021-07-16 ENCOUNTER — Telehealth (INDEPENDENT_AMBULATORY_CARE_PROVIDER_SITE_OTHER): Payer: Medicare Other | Admitting: Psychiatry

## 2021-07-16 DIAGNOSIS — F331 Major depressive disorder, recurrent, moderate: Secondary | ICD-10-CM | POA: Diagnosis not present

## 2021-07-16 MED ORDER — ARIPIPRAZOLE 2 MG PO TABS: 2.0000 mg | ORAL_TABLET | Freq: Every day | ORAL | 0 refills | Status: DC

## 2021-07-16 NOTE — Progress Notes (Signed)
Virtual Visit via Telephone Note  I connected with Angela Mason on 07/16/21 at 10:30 AM EST by telephone and verified that I am speaking with the correct person using two identifiers.  Location Provider Location : [facility] Patient Location : Home  Participants: Patient , Provider    I discussed the limitations, risks, security and privacy concerns of performing an evaluation and management service by telephone and the availability of in person appointments. I also discussed with the patient that there may be a patient responsible charge related to this service. The patient expressed understanding and agreed to proceed.   I discussed the assessment and treatment plan with the patient. The patient was provided an opportunity to ask questions and all were answered. The patient agreed with the plan and demonstrated an understanding of the instructions.   The patient was advised to call back or seek an in-person evaluation if the symptoms worsen or if the condition fails to improve as anticipated.    [place] MD OP Progress Note  07/16/2021 10:57 AM Angela Mason  MRN:  [id number]  Chief Complaint:  Chief Complaint   Follow-up; Depression    HPI: Angela Mason is a 86 year old Caucasian female who has a history of MDD with psychosis, acute metabolic encephalopathy, hypertension, hyperlipidemia, hypothyroidism, hyponatremia, COPD, cystocele , lives at [facility] living community, was evaluated by phone today.  Patient contacted the office reporting she was having depressive symptoms and hence this appointment was scheduled.  Patient today reports since the past few weeks she has been feeling more depressed.  She reports low mood, anhedonia, lack of motivation, low energy, excessive sleepiness likely some mornings.  She reports her mood symptoms started getting worse since going down on the Abilify to 1 mg.  She reports her daughter came to visit and noticed a change in her and advised  her to contact her provider.  Patient reports she continues to be able to take care of herself and has to be up in the morning to go shopping from the facility where she lives at.  They provide transportation to do so.  She however reports there has been days when she has had trouble waking up in the morning.  Patient denies any suicidality, homicidality or perceptual disturbances.  She appeared to be alert, oriented to person place time and situation.  Patient wonders whether the holidays could be making her mood symptoms worse.  Patient denies any other concerns today.  Visit Diagnosis:    ICD-10-CM   1. MDD (major depressive disorder), recurrent episode, moderate (HCC)  F33.1 ARIPiprazole (ABILIFY) 2 MG tablet      Past Psychiatric History: Reviewed past psychiatric history from progress note on 03/05/2021.  Past trials of Abilify, mirtazapine, Seroquel  Past Medical History:  Past Medical History:  Diagnosis Date   Cancer ([place])    SKIN   COPD, mild (HCC)    Dyspnea    Edema    HLD (hyperlipidemia)    HTN (hypertension)    Hypothyroidism    Macular degeneration, bilateral    Varicosities     Past Surgical History:  Procedure Laterality Date   CATARACT EXTRACTION W/PHACO Right 02/03/2017   Procedure: CATARACT EXTRACTION PHACO AND INTRAOCULAR LENS PLACEMENT ([place]);  Surgeon: Birder Robson, MD;  Location: [facility] ORS;  Service: Ophthalmology;  Laterality: Right;  [place] 00:57.2 AP% 18.5 CDE 10.58 Fluid pack lot # [id number]   CATARACT EXTRACTION W/PHACO Left 02/24/2017   Procedure: CATARACT EXTRACTION PHACO AND INTRAOCULAR LENS PLACEMENT (I[facility]);  Surgeon: Birder Robson, MD;  Location: [facility] ORS;  Service: Ophthalmology;  Laterality: Left;  [place] 01:12 AP% 25.0 CDE 18.03 Fluid pack lot # [id number]   MOHS SURGERY  2015   nose   SALIVARY GLAND SURGERY Left 2012   TONSILLECTOMY  1940    Family Psychiatric History: Reviewed family psychiatric history from progress note on  03/05/2021  Family History:  Family History  Problem Relation Age of Onset   Diverticulitis Mother    Macular degeneration Mother    COPD Father    Heart failure Father    Epilepsy Sister    Parkinson's disease Brother    Cancer Neg Hx    Diabetes Neg Hx    Breast cancer Neg Hx     Social History: Reviewed social history from progress note on 03/05/2021 Social History   Socioeconomic History   Marital status: Married    Spouse name: Not on file   Number of children: Not on file   Years of education: Not on file   Highest education level: Not on file  Occupational History   Not on file  Tobacco Use   Smoking status: Former    Types: Cigarettes    Quit date: 09/05/2005    Years since quitting: 15.8   Smokeless tobacco: Never  Vaping Use   Vaping Use: Never used  Substance and Sexual Activity   Alcohol use: Yes    Alcohol/week: 0.0 standard drinks    Comment: 1 qd   Drug use: No   Sexual activity: Not Currently    Birth control/protection: Post-menopausal  Other Topics Concern   Not on file  Social History Narrative   Not on file   Social Determinants of Health   Financial Resource Strain: Not on file  Food Insecurity: Not on file  Transportation Needs: Not on file  Physical Activity: Not on file  Stress: Not on file  Social Connections: Not on file    Allergies:  Allergies  Allergen Reactions   Gentamicin    Levofloxacin Diarrhea    Metabolic Disorder Labs: No results found for: HGBA1C, MPG No results found for: PROLACTIN No results found for: CHOL, TRIG, HDL, CHOLHDL, VLDL, LDLCALC Lab Results  Component Value Date   TSH 0.657 10/04/2020    Therapeutic Level Labs: No results found for: LITHIUM No results found for: VALPROATE No components found for:  CBMZ  Current Medications: Current Outpatient Medications  Medication Sig Dispense Refill   ARIPiprazole (ABILIFY) 2 MG tablet Take 1 tablet (2 mg total) by mouth daily with breakfast. 30 tablet 0    OXYQUINOLONE SULFATE VAGINAL 0.025 % GEL Place 1 application vaginally 2 (two) times a week. 15 g 0   atenolol (TENORMIN) 25 MG tablet Take 1 tablet (25 mg total) by mouth daily.     calcium-vitamin D (OSCAL WITH D) 500-200 MG-UNIT tablet Take 1 tablet by mouth 2 (two) times daily.     Coenzyme Q10 (CO Q 10 PO) Take 1 tablet by mouth daily.      estradiol (ESTRACE) 0.1 MG/GM vaginal cream Place 1/2 gm vaginally twice weekly at bedtime. 42.5 g 12   Ferrous Sulfate Dried (HIGH POTENCY IRON) 65 MG TABS Take by mouth.     lovastatin (MEVACOR) 20 MG tablet Take 20 mg by mouth at bedtime.     melatonin 5 MG TABS Take 1 tablet (5 mg total) by mouth every evening.  0   mirtazapine (REMERON) 15 MG tablet Take 1 tablet (15 mg total)  by mouth at bedtime. Has enough supplies - report she is on 15 mg daily 30 tablet 1   Multiple Vitamins-Minerals (PRESERVISION AREDS) TABS Take 1 capsule by mouth 2 (two) times daily.     Omega-3 Fatty Acids (FISH OIL CONCENTRATE PO) Take 1 tablet by mouth daily.      tetrahydrozoline 0.05 % ophthalmic solution Place 1 drop into both eyes daily as needed (dry eyes).     No current facility-administered medications for this visit.     Musculoskeletal: Strength & Muscle Tone:  UTA Gait & Station:  UTA Patient leans: N/A  Psychiatric Specialty Exam: Review of Systems  Psychiatric/Behavioral:  Positive for decreased concentration, dysphoric mood and sleep disturbance.   All other systems reviewed and are negative.  There were no vitals taken for this visit.There is no height or weight on file to calculate BMI.  General Appearance:  UTA  Eye Contact:   UTA  Speech:  Clear and Coherent  Volume:  Normal  Mood:  Depressed  Affect:   UTA  Thought Process:  Goal Directed and Descriptions of Associations: Intact  Orientation:  Full (Time, Place, and Person)  Thought Content: Logical   Suicidal Thoughts:  No  Homicidal Thoughts:  No  Memory:  Immediate;   Fair Recent;    Fair Remote;   Fair  Judgement:  Fair  Insight:  Fair  Psychomotor Activity:   UTA  Concentration:  Concentration: Fair and Attention Span: Fair  Recall:  [employer] of Knowledge: Fair  Language: Fair  Akathisia:  No  Handed:  Right  AIMS (if indicated): not done  Assets:  Communication Skills [employer] [place] Talents/Skills Transportation  ADL's:  Intact  Cognition: WNL  Sleep:   Excessive at times   Screenings: [place] Office Visit from 03/05/2021 in [place] Total Score 0      PHQ2-9    [place] Video Visit from 07/16/2021 in [place] Visit from 04/03/2021 in [place]  PHQ-2 Total Score 5 0  PHQ-9 Total Score 14 0      Flowsheet Row Video Visit from 07/16/2021 in [place] [facility] to Hosp-Admission (Discharged) from 10/04/2020 in [place] No Risk No Risk        Assessment and Plan: Jamilynn Whitacre is an 87 year old Caucasian female who lives at [facility] senior living community, has a history of MDD, multiple medical problems including hypertension, hyperlipidemia, cystocele, rectocele, history of inpatient medical admission in March 2022 for UTI, hyponatremia, encephalopathy, AMS-diagnosed with delirium as well as MDD with psychosis.  Patient is currently struggling with depressive symptoms and will benefit from the following plan.  Plan MDD-unstable Mirtazapine 15 mg p.o. nightly Increase Abilify to 2 mg p.o. daily.  Provided education, discussed side effects of medication.  Patient to follow up in person in 2 weeks from now.  Patient advised to call back sooner as needed.   I have spent at least 15 minutes non face to face with patient today .  This note was generated in part or whole with voice recognition  software. Voice recognition is usually quite accurate but there are transcription errors that can and very often do occur. I apologize for any typographical errors that were not detected and corrected.     Ursula Alert, MD 07/16/2021, 10:57 AM

## 2021-07-30 ENCOUNTER — Ambulatory Visit (INDEPENDENT_AMBULATORY_CARE_PROVIDER_SITE_OTHER): Payer: Medicare Other | Admitting: Obstetrics and Gynecology

## 2021-07-30 ENCOUNTER — Other Ambulatory Visit: Payer: Self-pay

## 2021-07-30 ENCOUNTER — Encounter: Payer: Self-pay | Admitting: Obstetrics and Gynecology

## 2021-07-30 VITALS — BP 166/60 | Ht <= 58 in | Wt 83.0 lb

## 2021-07-30 DIAGNOSIS — N952 Postmenopausal atrophic vaginitis: Secondary | ICD-10-CM | POA: Diagnosis not present

## 2021-07-30 DIAGNOSIS — Z4689 Encounter for fitting and adjustment of other specified devices: Secondary | ICD-10-CM

## 2021-07-30 DIAGNOSIS — I10 Essential (primary) hypertension: Secondary | ICD-10-CM

## 2021-07-30 DIAGNOSIS — N813 Complete uterovaginal prolapse: Secondary | ICD-10-CM

## 2021-07-30 DIAGNOSIS — N958 Other specified menopausal and perimenopausal disorders: Secondary | ICD-10-CM

## 2021-07-30 NOTE — Progress Notes (Signed)
° ° °  GYNECOLOGY PROGRESS NOTE  Subjective:    [name], female    DOB: 1932-08-23, 88 y.o.   MRN: [id number]  HPI  Patient is a 88 y.o. G42P1001 female who presents for pessary check. She has a history of cystocele, rectocele, complete procidentia, and incomplete bladder emptying.  Notes discomfort with current pessary use, especially when time for removal, usually leads to bleeding.    The following portions of the patient's history were reviewed and updated as appropriate: allergies, current medications, past family history, past medical history, past social history, past surgical history, and problem list.  Review of Systems Pertinent items noted in HPI and remainder of comprehensive ROS otherwise negative.   Objective:   Blood pressure (!) 166/60, height 4\' 10"  (1.473 m), weight 83 lb (37.6 kg). Body mass index is 17.35 kg/m. General appearance: alert and no distress Abdomen: soft, non-tender; bowel sounds normal; no masses,  no organomegaly Pelvic: the patient's  size 2  3/4 incontinence dish pessary was removed without complications. External exam appears normal.  Speculum examination revealed mild to moderate atrophic vaginal mucosa, scant blood in vaginal vault, no lesions.   Assessment:   1. Pessary maintenance   2. 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 Vaginal atrophy   4. Genitourinary syndrome of menopause   5. Essential hypertension       Plan:   -Patient noting discomfort with her current pessary.  I discussed that with her recent weight loss it may be more uncomfortable for her use.  We have retained 1 of her old pessaries in the office.  Changed pessary back down to size to 2 1/4 ring with support. Will f/u in 3 months for pessary check.  -Patient no longer able to use her Estrace cream due to cost.  Discussed that this also could be an issue with wide removal of the pessary may be difficult.  Given samples of Premarin  cream in the office.  Advised to begin using approximately 1 week prior to next pessary check daily.  Continue use of Trimo-san gel for routine pessary maintenance.   - Uncontrolled HTN noted today. Patient asymptomatic, compliant with medications.  Notes that she was very nervous about today's exam as pessary removal is usually very uncomfortable for her.   Rubie Maid, MD [facility]

## 2021-07-30 NOTE — Patient Instructions (Signed)
Use Premarin daily for 1 week prior to next pessary fitting.

## 2021-07-31 ENCOUNTER — Ambulatory Visit (INDEPENDENT_AMBULATORY_CARE_PROVIDER_SITE_OTHER): Payer: Medicare Other | Admitting: Psychiatry

## 2021-07-31 ENCOUNTER — Encounter: Payer: Self-pay | Admitting: Psychiatry

## 2021-07-31 VITALS — BP 168/80 | HR 64 | Temp 98.5°F | Wt 79.8 lb

## 2021-07-31 DIAGNOSIS — F331 Major depressive disorder, recurrent, moderate: Secondary | ICD-10-CM | POA: Diagnosis not present

## 2021-07-31 DIAGNOSIS — F411 Generalized anxiety disorder: Secondary | ICD-10-CM

## 2021-07-31 DIAGNOSIS — I10 Essential (primary) hypertension: Secondary | ICD-10-CM | POA: Diagnosis not present

## 2021-07-31 DIAGNOSIS — G4700 Insomnia, unspecified: Secondary | ICD-10-CM | POA: Insufficient documentation

## 2021-07-31 MED ORDER — MIRTAZAPINE 15 MG PO TABS: 15.0000 mg | ORAL_TABLET | Freq: Every day | ORAL | 2 refills | Status: DC

## 2021-07-31 MED ORDER — ARIPIPRAZOLE 2 MG PO TABS: 2.0000 mg | ORAL_TABLET | Freq: Every day | ORAL | 2 refills | Status: DC

## 2021-07-31 NOTE — Patient Instructions (Signed)
Managing Anxiety, Adult ?After being diagnosed with anxiety, you may be relieved to know why you have felt or behaved a certain way. You may also feel overwhelmed about the treatment ahead and what it will mean for your life. With care and support, you can manage this condition. ?How to manage lifestyle changes ?Managing stress and anxiety ?Stress is your body's reaction to life changes and events, both good and bad. Most stress will last just a few hours, but stress can be ongoing and can lead to more than just stress. Although stress can play a major role in anxiety, it is not the same as anxiety. Stress is usually caused by something external, such as a deadline, test, or competition. Stress normally passes after the triggering event has ended.  ?Anxiety is caused by something internal, such as imagining a terrible outcome or worrying that something will go wrong that will devastate you. Anxiety often does not go away even after the triggering event is over, and it can become long-term (chronic) worry. It is important to understand the differences between stress and anxiety and to manage your stress effectively so that it does not lead to an anxious response. ?Talk with your health care provider or a counselor to learn more about reducing anxiety and stress. He or she may suggest tension reduction techniques, such as: ?Music therapy. Spend time creating or listening to music that you enjoy and that inspires you. ?Mindfulness-based meditation. Practice being aware of your normal breaths while not trying to control your breathing. It can be done while sitting or walking. ?Centering prayer. This involves focusing on a word, phrase, or sacred image that means something to you and brings you peace. ?Deep breathing. To do this, expand your stomach and inhale slowly through your nose. Hold your breath for 3-5 seconds. Then exhale slowly, letting your stomach muscles relax. ?Self-talk. Learn to notice and identify  thought patterns that lead to anxiety reactions and change those patterns to thoughts that feel peaceful. ?Muscle relaxation. Taking time to tense muscles and then relax them. ?Choose a tension reduction technique that fits your lifestyle and personality. These techniques take time and practice. Set aside 5-15 minutes a day to do them. Therapists can offer counseling and training in these techniques. The training to help with anxiety may be covered by some insurance plans. ?Other things you can do to manage stress and anxiety include: ?Keeping a stress diary. This can help you learn what triggers your reaction and then learn ways to manage your response. ?Thinking about how you react to certain situations. You may not be able to control everything, but you can control your response. ?Making time for activities that help you relax and not feeling guilty about spending your time in this way. ?Doing visual imagery. This involves imagining or creating mental pictures to help you relax. ?Practicing yoga. Through yoga poses, you can lower tension and promote relaxation. ? ?Medicines ?Medicines can help ease symptoms. Medicines for anxiety include: ?Antidepressant medicines. These are usually prescribed for long-term daily control. ?Anti-anxiety medicines. These may be added in severe cases, especially when panic attacks occur. ?Medicines will be prescribed by a health care provider. When used together, medicines, psychotherapy, and tension reduction techniques may be the most effective treatment. ?Relationships ?Relationships can play a big part in helping you recover. Try to spend more time connecting with trusted friends and family members. ?Consider going to couples counseling if you have a partner, taking family education classes, or going to family   therapy. ?Therapy can help you and others better understand your condition. ?How to recognize changes in your anxiety ?Everyone responds differently to treatment for  anxiety. Recovery from anxiety happens when symptoms decrease and stop interfering with your daily activities at home or work. This may mean that you will start to: ?Have better concentration and focus. Worry will interfere less in your daily thinking. ?Sleep better. ?Be less irritable. ?Have more energy. ?Have improved memory. ?It is also important to recognize when your condition is getting worse. Contact your health care provider if your symptoms interfere with home or work and you feel like your condition is not improving. ?Follow these instructions at home: ?Activity ?Exercise. Adults should do the following: ?Exercise for at least 150 minutes each week. The exercise should increase your heart rate and make you sweat (moderate-intensity exercise). ?Strengthening exercises at least twice a week. ?Get the right amount and quality of sleep. Most adults need 7-9 hours of sleep each night. ?Lifestyle ? ?Eat a healthy diet that includes plenty of vegetables, fruits, whole grains, low-fat dairy products, and lean protein. ?Do not eat a lot of foods that are high in fats, added sugars, or salt (sodium). ?Make choices that simplify your life. ?Do not use any products that contain nicotine or tobacco. These products include cigarettes, chewing tobacco, and vaping devices, such as e-cigarettes. If you need help quitting, ask your health care provider. ?Avoid caffeine, alcohol, and certain over-the-counter cold medicines. These may make you feel worse. Ask your pharmacist which medicines to avoid. ?General instructions ?Take over-the-counter and prescription medicines only as told by your health care provider. ?Keep all follow-up visits. This is important. ?Where to find support ?You can get help and support from these sources: ?Self-help groups. ?Online and community organizations. ?A trusted spiritual leader. ?Couples counseling. ?Family education classes. ?Family therapy. ?Where to find more information ?You may find  that joining a support group helps you deal with your anxiety. The following sources can help you locate counselors or support groups near you: ?Mental Health America: www.mentalhealthamerica.net ?Anxiety and Depression Association of America (ADAA): www.adaa.org ?National Alliance on Mental Illness (NAMI): www.nami.org ?Contact a health care provider if: ?You have a hard time staying focused or finishing daily tasks. ?You spend many hours a day feeling worried about everyday life. ?You become exhausted by worry. ?You start to have headaches or frequently feel tense. ?You develop chronic nausea or diarrhea. ?Get help right away if: ?You have a racing heart and shortness of breath. ?You have thoughts of hurting yourself or others. ?If you ever feel like you may hurt yourself or others, or have thoughts about taking your own life, get help right away. Go to your nearest emergency department or: ?Call your local emergency services (911 in the [place] ?Call a suicide crisis helpline, such as the National Suicide Prevention Lifeline at 1-800-273-8255 or 988 in the [place] This is open 24 hours a day in the [place] ?Text the Crisis Text Line at 741741 (in the [place] ?Summary ?Taking steps to learn and use tension reduction techniques can help calm you and help prevent triggering an anxiety reaction. ?When used together, medicines, psychotherapy, and tension reduction techniques may be the most effective treatment. ?Family, friends, and partners can play a big part in supporting you. ?This information is not intended to replace advice given to you by your health care provider. Make sure you discuss any questions you have with your health care provider. ?Document Revised: 02/06/2021 Document Reviewed: 11/04/2020 ?Elsevier Patient   Education ? 2022 [employer] ? ?

## 2021-07-31 NOTE — Progress Notes (Signed)
[place] MD OP Progress Note  07/31/2021 5:58 PM Angela Mason  MRN:  [id number]  Chief Complaint:  Chief Complaint   Follow-up; Anxiety; Sleeping Problem    HPI: Angela Mason is a 88 year old Caucasian female who has a history of MDD with psychosis, acute metabolic encephalopathy, hypertension, hyperlipidemia, hypothyroidism, hyponatremia, COPD, lives at [facility] living community was evaluated in office today.  Patient's last visit was by phone on 07/16/2021.  At that visit patient was restarted on Abilify 2 mg since she was not doing well.  Patient today returns reporting she has not noticed much benefit from being back on the Abilify.  She reports she struggles with a lot of anxiety all the time.  She believes that is mostly because of her environment and her 'external situation' rather than anything else.  Patient hence reports she does not want to make any further medication changes since she does not believe any of her medications are going to help with the current psychosocial stressors or change anything in her situation.  Patient today reports the only reason she is here is because of her daughter who wants her to follow-up with Probation officer.  Patient struggles with nervousness, anxiety especially when she has to get out and go to do her grocery shopping, doctor's appointments.  She reports she gets extremely anxious since she has to wait for transportation and has to be up and ready at a certain time.  She reports usually when she has to an appointment or shopping coming up the previous night she is unable to sleep because of her racing thoughts and anxiety.  This has been going on for a while.  Patient reports she is sad, has low mood often and struggles with concentration mostly because of her situational stressors.  This has been getting worse since the past few weeks.  She does not believe the medication has changed anything.  Patient denies any suicidality, homicidality or  perceptual disturbances.  Patient is not interested in psychotherapy sessions, reports she is not willing to do it when writer discussed the same with her.  Patient appeared to be alert, oriented to person place time and situation today.  Patient with elevated blood pressure reading in session today, repeat blood pressure continued to be elevated.  Patient reports she is compliant on her antihypertensive medications.  Patient denies any other concerns today.    Visit Diagnosis:    ICD-10-CM   1. MDD (major depressive disorder), recurrent episode, moderate (HCC)  F33.1 ARIPiprazole (ABILIFY) 2 MG tablet    2. GAD (generalized anxiety disorder)  F41.1 mirtazapine (REMERON) 15 MG tablet    3. Insomnia, unspecified type  G47.00     4. Elevated blood pressure reading in office with diagnosis of hypertension  I10       Past Psychiatric History: I have reviewed past psychiatric history from progress note on 03/05/2021.  Past trials of Abilify, mirtazapine, Seroquel.  Past Medical History:  Past Medical History:  Diagnosis Date   Cancer ([place])    SKIN   COPD, mild (HCC)    Dyspnea    Edema    HLD (hyperlipidemia)    HTN (hypertension)    Hypothyroidism    Macular degeneration, bilateral    Varicosities     Past Surgical History:  Procedure Laterality Date   CATARACT EXTRACTION W/PHACO Right 02/03/2017   Procedure: CATARACT EXTRACTION PHACO AND INTRAOCULAR LENS PLACEMENT ([place]);  Surgeon: Birder Robson, MD;  Location: [facility] ORS;  Service: Ophthalmology;  Laterality: Right;  [place] 00:57.2 AP% 18.5 CDE 10.58 Fluid pack lot # [id number]   CATARACT EXTRACTION W/PHACO Left 02/24/2017   Procedure: CATARACT EXTRACTION PHACO AND INTRAOCULAR LENS PLACEMENT ([facility]);  Surgeon: Birder Robson, MD;  Location: [facility] ORS;  Service: Ophthalmology;  Laterality: Left;  [place] 01:12 AP% 25.0 CDE 18.03 Fluid pack lot # [id number]   MOHS SURGERY  2015   nose   SALIVARY GLAND SURGERY Left 2012    TONSILLECTOMY  1940    Family Psychiatric History: Reviewed family psychiatric history from progress note on 03/05/2021.  Family History:  Family History  Problem Relation Age of Onset   Diverticulitis Mother    Macular degeneration Mother    COPD Father    Heart failure Father    Epilepsy Sister    Parkinson's disease Brother    Cancer Neg Hx    Diabetes Neg Hx    Breast cancer Neg Hx     Social History: Reviewed social history from progress note on 03/05/2021. Social History   Socioeconomic History   Marital status: Married    Spouse name: Not on file   Number of children: Not on file   Years of education: Not on file   Highest education level: Not on file  Occupational History   Not on file  Tobacco Use   Smoking status: Former    Types: Cigarettes    Quit date: 09/05/2005    Years since quitting: 15.9   Smokeless tobacco: Never  Vaping Use   Vaping Use: Never used  Substance and Sexual Activity   Alcohol use: Yes    Alcohol/week: 0.0 standard drinks    Comment: 1 qd   Drug use: No   Sexual activity: Not Currently    Birth control/protection: Post-menopausal  Other Topics Concern   Not on file  Social History Narrative   Not on file   Social Determinants of Health   Financial Resource Strain: Not on file  Food Insecurity: Not on file  Transportation Needs: Not on file  Physical Activity: Not on file  Stress: Not on file  Social Connections: Not on file    Allergies:  Allergies  Allergen Reactions   Gentamicin    Levofloxacin Diarrhea    Metabolic Disorder Labs: No results found for: HGBA1C, MPG No results found for: PROLACTIN No results found for: CHOL, TRIG, HDL, CHOLHDL, VLDL, LDLCALC Lab Results  Component Value Date   TSH 0.657 10/04/2020    Therapeutic Level Labs: No results found for: LITHIUM No results found for: VALPROATE No components found for:  CBMZ  Current Medications: Current Outpatient Medications  Medication Sig Dispense  Refill   atenolol (TENORMIN) 25 MG tablet Take 1 tablet (25 mg total) by mouth daily.     calcium-vitamin D (OSCAL WITH D) 500-200 MG-UNIT tablet Take 1 tablet by mouth 2 (two) times daily.     Coenzyme Q10 (CO Q 10 PO) Take 1 tablet by mouth daily.      estradiol (ESTRACE) 0.1 MG/GM vaginal cream Place 1/2 gm vaginally twice weekly at bedtime. 42.5 g 12   Ferrous Sulfate Dried (HIGH POTENCY IRON) 65 MG TABS Take by mouth.     lovastatin (MEVACOR) 20 MG tablet Take 20 mg by mouth at bedtime.     melatonin 5 MG TABS Take 1 tablet (5 mg total) by mouth every evening.  0   Multiple Vitamins-Minerals (PRESERVISION AREDS) TABS Take 1 capsule by mouth 2 (two) times daily.     Omega-3  Fatty Acids (FISH OIL CONCENTRATE PO) Take 1 tablet by mouth daily.      OXYQUINOLONE SULFATE VAGINAL 0.025 % GEL Place 1 application vaginally 2 (two) times a week. 15 g 0   tetrahydrozoline 0.05 % ophthalmic solution Place 1 drop into both eyes daily as needed (dry eyes).     ARIPiprazole (ABILIFY) 2 MG tablet Take 1 tablet (2 mg total) by mouth daily with breakfast. 30 tablet 2   mirtazapine (REMERON) 15 MG tablet Take 1 tablet (15 mg total) by mouth at bedtime. 30 tablet 2   No current facility-administered medications for this visit.     Musculoskeletal: Strength & Muscle Tone: within normal limits Gait & Station:  walks with walker Patient leans: Front  Psychiatric Specialty Exam: Review of Systems  Psychiatric/Behavioral:  Positive for decreased concentration, dysphoric mood and sleep disturbance. The patient is nervous/anxious.   All other systems reviewed and are negative.  Blood pressure (!) 168/80, pulse 64, temperature 98.5 F (36.9 C), temperature source Temporal, weight 79 lb 12.8 oz (36.2 kg).Body mass index is 16.68 kg/m.  General Appearance: Casual  Eye Contact:  Fair  Speech:  Normal Rate  Volume:  Normal  Mood:  Anxious and Depressed  Affect:  Congruent  Thought Process:  Goal Directed  and Descriptions of Associations: Intact  Orientation:  Full (Time, Place, and Person)  Thought Content: Logical   Suicidal Thoughts:  No  Homicidal Thoughts:  No  Memory:  Immediate;   Fair Recent;   Fair Remote;   limited  Judgement:  Fair  Insight:  Fair  Psychomotor Activity:  Normal  Concentration:  Concentration: Fair and Attention Span: Fair  Recall:  [employer] of Knowledge: Fair  Language: Fair  Akathisia:  No  Handed:  Right  AIMS (if indicated): done, 0  Assets:  Communication Skills Desire for Improvement Housing Social Support  ADL's:  Intact  Cognition: WNL  Sleep:   restless at times   Screenings: [place] Office Visit from 07/31/2021 in [place] Visit from 03/05/2021 in [place] Total Score 0 [place] Visit from 07/31/2021 in [place]  Total GAD-7 Score 11      PHQ2-9    [place] Visit from 07/31/2021 in [place] Video Visit from 07/16/2021 in [place] Visit from 04/03/2021 in [place] [facility]  PHQ-2 Total Score 2 5 0  PHQ-9 Total Score 8 14 0      [place] Visit from 07/31/2021 in [place] Video Visit from 07/16/2021 in [facility] (Discharged) from 10/04/2020 in [place] No Risk No Risk No Risk        Assessment and Plan: Foye Haggart is a 88 year old Caucasian female who lives at [facility] living community, has a history of MDD, multiple medical problems including hypertension, hyperlipidemia, cystocele, rectocele, history of inpatient medical admission in March 2022 for UTI, hyponatremia, encephalopathy, AMS-diagnosed with delirium as  well as MDD with psychosis.  Patient today returns reporting worsening mood symptoms, will benefit from the following plan.  Plan MDD-unstable Mirtazapine 15 mg p.o. nightly Abilify 2 mg p.o. daily. Although she believes the Abilify is not beneficial she is not interested in coming off of it today,  reports she has to take it to please her daughter.  GAD-unstable Discussed referral for CBT, patient is not interested. Continue mirtazapine 15 mg p.o. nightly. Discussed adding a low dosage of Klonopin as needed for severe anxiety attacks couple of times a week however patient is not interested.  Declines any medication changes today.  Insomnia-unstable Discussed sleep hygiene techniques. Continue mirtazapine 15 mg p.o. nightly Declines medication changes  Elevated blood pressure reading-unstable Patient advised to keep a log of her blood pressure at home.  We will continue to monitor her blood pressure in session.  Encourage compliance with medication. Patient could also follow up with her primary care provider as needed.  Patient reports she is not interested in following up with writer since she already has a lot of appointments and having another appointment just makes her more anxious.  Patient encouraged to follow up with her primary care provider.  Patient could follow up with writer as needed.  This note was generated in part or whole with voice recognition software. Voice recognition is usually quite accurate but there are transcription errors that can and very often do occur. I apologize for any typographical errors that were not detected and corrected.     Ursula Alert, MD 08/02/2021, 8:17 AM

## 2021-10-07 ENCOUNTER — Telehealth: Payer: Self-pay | Admitting: Obstetrics and Gynecology

## 2021-10-07 NOTE — Telephone Encounter (Signed)
Patient calling has questions about the meds that she is on.  ? ?CB# [phone] ?

## 2021-10-09 NOTE — Telephone Encounter (Signed)
Pt called asking for update on call from Monday- states that she has question about the Rx sent - that she is not familiar with with name of Rx. Made pt aware that All staff is currently in clinic and a message will be sent back. Please Advsie.  ?

## 2021-10-09 NOTE — Telephone Encounter (Signed)
If patient calls back please tell her that she should pick up Premarin samples from front desk. She needs to apply cream vaginally 2 times a week. Monday and Thursday. Fill to the first black line on the plunger. 0.5 ?

## 2021-10-15 NOTE — Telephone Encounter (Signed)
Pt called asking to speak with Dr.Cherry- I made her aware provider is in clinic- I read her the instructions about premarin- she wrote them down but still has questions about tenormin. Please advise.  ?

## 2021-11-04 NOTE — Progress Notes (Signed)
? ? ?  GYNECOLOGY PROGRESS NOTE ? ?Subjective:  ? ? [name], female    DOB: 1932/10/30, 88 y.o.   MRN: [id number] ? ?HPI ? ?Patient is a 88 y.o. G68P1001 female who presents for pessary check. She has a history of cystocele, rectocele, complete procidentia, and incomplete bladder emptying. Also with significant vaginal atrophy. Notes some occasional leaking.  Is dreading pessary removal due to significant discomfort last visit. Notes this time she has been using Premarin cream as prescribed (previously was non-compliant).  ? ? ?The following portions of the patient's history were reviewed and updated as appropriate: allergies, current medications, past family history, past medical history, past social history, past surgical history, and problem list. ? ?Review of Systems ?Pertinent items noted in HPI and remainder of comprehensive ROS otherwise negative.  ? ?Objective:  ? Blood pressure 135/85, pulse 72, resp. rate 16, height '4\' 10"'$  (1.473 m), weight 86 lb 3.2 oz (39.1 kg). Body mass index is 18.02 kg/m?. ?General appearance: alert and no distress ?Abdomen: soft, non-tender; bowel sounds normal; no masses,  no organomegaly ?Pelvic: the patient's  size 2  1/4 ring with support pessary was removed without complications. External exam appears normal.  Speculum examination revealed mild to moderate atrophic vaginal mucosa, no lesions. ? ? ?Assessment:  ? ?1. Pessary maintenance   ?2. 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   ?3. Vaginal atrophy   ?4. Genitourinary syndrome of menopause   ? ?  ? ?Plan:  ? ?-Continue pessary checks every 3 months. ?-Continue use of Premarin cream for pessary cream. Continue use of Trimo-san gel for routine pessary maintenance.   ? ? ?Rubie Maid, MD ?[facility] ?

## 2021-11-05 ENCOUNTER — Encounter: Payer: Self-pay | Admitting: Obstetrics and Gynecology

## 2021-11-05 ENCOUNTER — Ambulatory Visit (INDEPENDENT_AMBULATORY_CARE_PROVIDER_SITE_OTHER): Payer: Medicare Other | Admitting: Obstetrics and Gynecology

## 2021-11-05 VITALS — BP 135/85 | HR 72 | Resp 16 | Ht <= 58 in | Wt 86.2 lb

## 2021-11-05 DIAGNOSIS — Z4689 Encounter for fitting and adjustment of other specified devices: Secondary | ICD-10-CM | POA: Diagnosis not present

## 2021-11-05 DIAGNOSIS — N952 Postmenopausal atrophic vaginitis: Secondary | ICD-10-CM | POA: Diagnosis not present

## 2021-11-05 DIAGNOSIS — N958 Other specified menopausal and perimenopausal disorders: Secondary | ICD-10-CM

## 2021-11-05 DIAGNOSIS — N813 Complete uterovaginal prolapse: Secondary | ICD-10-CM

## 2022-01-10 ENCOUNTER — Telehealth: Payer: Self-pay | Admitting: Obstetrics and Gynecology

## 2022-01-10 DIAGNOSIS — N813 Complete uterovaginal prolapse: Secondary | ICD-10-CM

## 2022-01-10 DIAGNOSIS — N952 Postmenopausal atrophic vaginitis: Secondary | ICD-10-CM

## 2022-01-10 DIAGNOSIS — Z4689 Encounter for fitting and adjustment of other specified devices: Secondary | ICD-10-CM

## 2022-01-10 NOTE — Telephone Encounter (Signed)
Patient called and states that she needs maybe some samples of premarin. Pharmacy states that they don't keep it in stock because the insurance is too high. Patient states she has visited [facility] and [place]. Patient states medication is about $400.00. Please advise.

## 2022-01-11 NOTE — Telephone Encounter (Signed)
Patient can come get samples, can also find out if Estrace cream would be a cheaper option for her. Would be same signature as the Premarin. Otherwise, may need to send to compounding pharmacy such as [facility].

## 2022-01-13 ENCOUNTER — Telehealth: Payer: Self-pay | Admitting: Obstetrics and Gynecology

## 2022-01-13 MED ORDER — ESTRADIOL 0.1 MG/GM VA CREA: 1.0000 | TOPICAL_CREAM | Freq: Every day | VAGINAL | Status: DC

## 2022-01-13 NOTE — Telephone Encounter (Signed)
Patient medication was placed at front desk for pick up. Tried to call patient back about rectal bleeding no answer.

## 2022-01-13 NOTE — Telephone Encounter (Signed)
Pt states she is now having bleeding from the rectum  as of 01/12/22. Pt states it  is only when she uses the restroom but does require her to wipe multiple times. No other symptoms. Pt is requesting to pick up her samples this am.

## 2022-01-13 NOTE — Telephone Encounter (Signed)
Sample (1)  have been placed at front desk for patient to come and pick up.

## 2022-01-27 ENCOUNTER — Emergency Department: Payer: Medicare Other

## 2022-01-27 ENCOUNTER — Inpatient Hospital Stay
Admission: EM | Admit: 2022-01-27 | Discharge: 2022-02-01 | DRG: 551 | Disposition: A | Discharge status: Skilled Nursing Facility | Payer: Medicare Other | Attending: Hospitalist | Admitting: Hospitalist

## 2022-01-27 ENCOUNTER — Other Ambulatory Visit: Payer: Self-pay

## 2022-01-27 DIAGNOSIS — I160 Hypertensive urgency: Secondary | ICD-10-CM | POA: Diagnosis present

## 2022-01-27 DIAGNOSIS — Z681 Body mass index (BMI) 19 or less, adult: Secondary | ICD-10-CM

## 2022-01-27 DIAGNOSIS — N390 Urinary tract infection, site not specified: Secondary | ICD-10-CM | POA: Diagnosis present

## 2022-01-27 DIAGNOSIS — E039 Hypothyroidism, unspecified: Secondary | ICD-10-CM | POA: Diagnosis present

## 2022-01-27 DIAGNOSIS — Z20822 Contact with and (suspected) exposure to covid-19: Secondary | ICD-10-CM | POA: Diagnosis present

## 2022-01-27 DIAGNOSIS — J439 Emphysema, unspecified: Secondary | ICD-10-CM | POA: Diagnosis present

## 2022-01-27 DIAGNOSIS — Z881 Allergy status to other antibiotic agents status: Secondary | ICD-10-CM

## 2022-01-27 DIAGNOSIS — R0902 Hypoxemia: Secondary | ICD-10-CM | POA: Diagnosis present

## 2022-01-27 DIAGNOSIS — I10 Essential (primary) hypertension: Secondary | ICD-10-CM | POA: Diagnosis present

## 2022-01-27 DIAGNOSIS — S32119A Unspecified Zone I fracture of sacrum, initial encounter for closed fracture: Principal | ICD-10-CM | POA: Diagnosis present

## 2022-01-27 DIAGNOSIS — E43 Unspecified severe protein-calorie malnutrition: Secondary | ICD-10-CM | POA: Insufficient documentation

## 2022-01-27 DIAGNOSIS — S32512A Fracture of superior rim of left pubis, initial encounter for closed fracture: Secondary | ICD-10-CM

## 2022-01-27 DIAGNOSIS — W010XXA Fall on same level from slipping, tripping and stumbling without subsequent striking against object, initial encounter: Secondary | ICD-10-CM | POA: Diagnosis present

## 2022-01-27 DIAGNOSIS — F32A Depression, unspecified: Secondary | ICD-10-CM | POA: Diagnosis present

## 2022-01-27 DIAGNOSIS — Z9841 Cataract extraction status, right eye: Secondary | ICD-10-CM

## 2022-01-27 DIAGNOSIS — Z79899 Other long term (current) drug therapy: Secondary | ICD-10-CM

## 2022-01-27 DIAGNOSIS — Z961 Presence of intraocular lens: Secondary | ICD-10-CM | POA: Diagnosis present

## 2022-01-27 DIAGNOSIS — Z9842 Cataract extraction status, left eye: Secondary | ICD-10-CM

## 2022-01-27 DIAGNOSIS — Z87891 Personal history of nicotine dependence: Secondary | ICD-10-CM | POA: Diagnosis not present

## 2022-01-27 DIAGNOSIS — S32592A Other specified fracture of left pubis, initial encounter for closed fracture: Secondary | ICD-10-CM | POA: Diagnosis not present

## 2022-01-27 DIAGNOSIS — E782 Mixed hyperlipidemia: Secondary | ICD-10-CM | POA: Diagnosis present

## 2022-01-27 DIAGNOSIS — E785 Hyperlipidemia, unspecified: Secondary | ICD-10-CM | POA: Diagnosis present

## 2022-01-27 DIAGNOSIS — W19XXXA Unspecified fall, initial encounter: Secondary | ICD-10-CM

## 2022-01-27 DIAGNOSIS — D72829 Elevated white blood cell count, unspecified: Secondary | ICD-10-CM

## 2022-01-27 DIAGNOSIS — H3530 Unspecified macular degeneration: Secondary | ICD-10-CM | POA: Diagnosis present

## 2022-01-27 DIAGNOSIS — J449 Chronic obstructive pulmonary disease, unspecified: Secondary | ICD-10-CM | POA: Insufficient documentation

## 2022-01-27 HISTORY — DX: Fracture of superior rim of left pubis, initial encounter for closed fracture: S32.512A

## 2022-01-27 LAB — CBC WITH DIFFERENTIAL/PLATELET
Abs Immature Granulocytes: 0.35 10*3/uL — ABNORMAL HIGH (ref 0.00–0.07)
Basophils Absolute: 0.1 10*3/uL (ref 0.0–0.1)
Basophils Relative: 0 %
Eosinophils Absolute: 0 10*3/uL (ref 0.0–0.5)
Eosinophils Relative: 0 %
HCT: 39.7 % (ref 36.0–46.0)
Hemoglobin: 12.9 g/dL (ref 12.0–15.0)
Immature Granulocytes: 2 %
Lymphocytes Relative: 4 %
Lymphs Abs: 0.8 10*3/uL (ref 0.7–4.0)
MCH: 31.9 pg (ref 26.0–34.0)
MCHC: 32.5 g/dL (ref 30.0–36.0)
MCV: 98.3 fL (ref 80.0–100.0)
Monocytes Absolute: 0.8 10*3/uL (ref 0.1–1.0)
Monocytes Relative: 4 %
Neutro Abs: 18.5 10*3/uL — ABNORMAL HIGH (ref 1.7–7.7)
Neutrophils Relative %: 90 %
Platelets: 217 10*3/uL (ref 150–400)
RBC: 4.04 MIL/uL (ref 3.87–5.11)
RDW: 12.3 % (ref 11.5–15.5)
WBC: 20.5 10*3/uL — ABNORMAL HIGH (ref 4.0–10.5)
nRBC: 0 % (ref 0.0–0.2)

## 2022-01-27 LAB — COMPREHENSIVE METABOLIC PANEL
ALT: 25 U/L (ref 0–44)
AST: 26 U/L (ref 15–41)
Albumin: 3.8 g/dL (ref 3.5–5.0)
Alkaline Phosphatase: 65 U/L (ref 38–126)
Anion gap: 6 (ref 5–15)
BUN: 31 mg/dL — ABNORMAL HIGH (ref 8–23)
CO2: 31 mmol/L (ref 22–32)
Calcium: 9 mg/dL (ref 8.9–10.3)
Chloride: 97 mmol/L — ABNORMAL LOW (ref 98–111)
Creatinine, Ser: 0.81 mg/dL (ref 0.44–1.00)
GFR, Estimated: >60 mL/min (ref >60)
Glucose, Bld: 106 mg/dL — ABNORMAL HIGH (ref 70–99)
Potassium: 4.2 mmol/L (ref 3.5–5.1)
Sodium: 134 mmol/L — ABNORMAL LOW (ref 135–145)
Total Bilirubin: 0.7 mg/dL (ref 0.3–1.2)
Total Protein: 6.8 g/dL (ref 6.5–8.1)

## 2022-01-27 LAB — RESP PANEL BY RT-PCR (FLU A&B, COVID) ARPGX2
Influenza A by PCR: NEGATIVE
Influenza B by PCR: NEGATIVE
SARS Coronavirus 2 by RT PCR: NEGATIVE

## 2022-01-27 LAB — BRAIN NATRIURETIC PEPTIDE: B Natriuretic Peptide: 163 pg/mL — ABNORMAL HIGH (ref 0.0–100.0)

## 2022-01-27 LAB — TROPONIN I (HIGH SENSITIVITY)
Troponin I (High Sensitivity): 9 ng/L (ref <18)
Troponin I (High Sensitivity): 8 ng/L (ref <18)

## 2022-01-27 MED ORDER — ARIPIPRAZOLE 2 MG PO TABS
2.0000 mg | ORAL_TABLET | Freq: Every day | ORAL | Status: DC
  Administered 2022-01-28 – 2022-02-01 (×5): 2 mg via ORAL
  Filled 2022-01-27 (×5): qty 1

## 2022-01-27 MED ORDER — ENOXAPARIN SODIUM 30 MG/0.3ML IJ SOSY
30.0000 mg | PREFILLED_SYRINGE | INTRAMUSCULAR | Status: DC
  Administered 2022-01-27 – 2022-01-31 (×5): 30 mg via SUBCUTANEOUS
  Filled 2022-01-27 (×5): qty 0.3

## 2022-01-27 MED ORDER — MORPHINE SULFATE (PF) 2 MG/ML IV SOLN: 0.5000 mg | INTRAVENOUS | Status: DC | PRN

## 2022-01-27 MED ORDER — ATENOLOL 25 MG PO TABS
25.0000 mg | ORAL_TABLET | Freq: Every day | ORAL | Status: DC
  Administered 2022-01-28 – 2022-02-01 (×5): 25 mg via ORAL
  Filled 2022-01-27 (×5): qty 1

## 2022-01-27 MED ORDER — MELATONIN 5 MG PO TABS: 5.0000 mg | ORAL_TABLET | Freq: Every evening | ORAL | Status: DC

## 2022-01-27 MED ORDER — MIRTAZAPINE 15 MG PO TABS
15.0000 mg | ORAL_TABLET | Freq: Every day | ORAL | Status: DC
  Administered 2022-01-27 – 2022-01-31 (×5): 15 mg via ORAL
  Filled 2022-01-27 (×5): qty 1

## 2022-01-27 MED ORDER — ONDANSETRON HCL 4 MG/2ML IJ SOLN: 4.0000 mg | Freq: Four times a day (QID) | INTRAMUSCULAR | Status: DC | PRN

## 2022-01-27 MED ORDER — HYDROCODONE-ACETAMINOPHEN 5-325 MG PO TABS: 1.0000 | ORAL_TABLET | Freq: Four times a day (QID) | ORAL | Status: DC | PRN

## 2022-01-27 MED ORDER — PRAVASTATIN SODIUM 20 MG PO TABS
20.0000 mg | ORAL_TABLET | Freq: Every day | ORAL | Status: DC
  Administered 2022-01-28 – 2022-01-31 (×4): 20 mg via ORAL
  Filled 2022-01-27 (×4): qty 1

## 2022-01-27 MED ORDER — ALBUTEROL SULFATE (2.5 MG/3ML) 0.083% IN NEBU: 2.5000 mg | INHALATION_SOLUTION | RESPIRATORY_TRACT | Status: DC | PRN

## 2022-01-27 MED ORDER — ONDANSETRON HCL 4 MG PO TABS: 4.0000 mg | ORAL_TABLET | Freq: Four times a day (QID) | ORAL | Status: DC | PRN

## 2022-01-27 MED ORDER — HYDROCODONE-ACETAMINOPHEN 5-325 MG PO TABS
1.0000 | ORAL_TABLET | Freq: Once | ORAL | Status: AC
  Administered 2022-01-27: 1 via ORAL
  Filled 2022-01-27: qty 1

## 2022-01-27 MED ORDER — BISACODYL 5 MG PO TBEC: 5.0000 mg | DELAYED_RELEASE_TABLET | Freq: Every day | ORAL | Status: DC | PRN

## 2022-01-27 NOTE — Consult Note (Signed)
ORTHOPAEDIC CONSULTATION  REQUESTING PHYSICIAN: Athena Masse, MD  Chief Complaint:   Left hip/groin pain.  History of Present Illness: Angela Mason is a 88 y.o. female with multiple medical problems including hyperlipidemia, COPD, hypertension, and hypothyroidism who lives in an assisted living facility at [facility].  Her husband passed recently so she is now living alone.  She normally ambulates with a walker outside of the home, but is able to get around without a walker inside the home.  Apparently, earlier this afternoon, she got up from a nap and went to check the thermostat.  As she turned away from the thermostat, she apparently lost her balance and fell onto her left side.  She was brought to the emergency room where x-rays and subsequent CT scanning demonstrated mildly displaced left superior and inferior pubic rami fractures.  The patient was in too much pain to go back home, so she has been admitted for pain control and possible rehab placement.  The patient denies any associated injuries.  She did not strike her head or lose consciousness.  The patient also denies any lightheadedness, dizziness, chest pain, shortness of breath, or other symptoms which may have precipitated her fall.  Past Medical History:  Diagnosis Date   Cancer ([place])    SKIN   COPD, mild (HCC)    Dyspnea    Edema    HLD (hyperlipidemia)    HTN (hypertension)    Hypothyroidism    Macular degeneration, bilateral    Varicosities    Past Surgical History:  Procedure Laterality Date   CATARACT EXTRACTION W/PHACO Right 02/03/2017   Procedure: CATARACT EXTRACTION PHACO AND INTRAOCULAR LENS PLACEMENT ([place]);  Surgeon: Birder Robson, MD;  Location: [facility] ORS;  Service: Ophthalmology;  Laterality: Right;  [place] 00:57.2 AP% 18.5 CDE 10.58 Fluid pack lot # [id number]   CATARACT EXTRACTION W/PHACO Left 02/24/2017   Procedure: CATARACT EXTRACTION PHACO  AND INTRAOCULAR LENS PLACEMENT ([facility]);  Surgeon: Birder Robson, MD;  Location: [facility] ORS;  Service: Ophthalmology;  Laterality: Left;  [place] 01:12 AP% 25.0 CDE 18.03 Fluid pack lot # [id number]   MOHS SURGERY  2015   nose   SALIVARY GLAND SURGERY Left 2012   TONSILLECTOMY  1940   Social History   Socioeconomic History   Marital status: Married    Spouse name: Not on file   Number of children: Not on file   Years of education: Not on file   Highest education level: Not on file  Occupational History   Not on file  Tobacco Use   Smoking status: Former    Types: Cigarettes    Quit date: 09/05/2005    Years since quitting: 16.4   Smokeless tobacco: Never  Vaping Use   Vaping Use: Never used  Substance and Sexual Activity   Alcohol use: Yes    Alcohol/week: 0.0 standard drinks of alcohol    Comment: 1 qd   Drug use: No   Sexual activity: Not Currently    Birth control/protection: Post-menopausal  Other Topics Concern   Not on file  Social History Narrative   Not on file   Social Determinants of Health   Financial Resource Strain: Not on file  Food Insecurity: Not on file  Transportation Needs: Not on file  Physical Activity: Not on file  Stress: Not on file  Social Connections: Not on file   Family History  Problem Relation Age of Onset   Diverticulitis Mother    Macular degeneration Mother    COPD  Father    Heart failure Father    Epilepsy Sister    Parkinson's disease Brother    Cancer Neg Hx    Diabetes Neg Hx    Breast cancer Neg Hx    Allergies  Allergen Reactions   Gentamicin    Levofloxacin Diarrhea   Prior to Admission medications   Medication Sig Start Date End Date Taking? Authorizing Provider  ARIPiprazole (ABILIFY) 2 MG tablet Take 1 tablet (2 mg total) by mouth daily with breakfast. 07/31/21  Yes Eappen, Saramma, MD  atenolol (TENORMIN) 25 MG tablet Take 1 tablet (25 mg total) by mouth daily. 10/16/20  Yes Max Sane, MD  calcium-vitamin D (OSCAL  WITH D) 500-200 MG-UNIT tablet Take 1 tablet by mouth daily.   Yes [provider]  Ferrous Sulfate Dried (HIGH POTENCY IRON) 65 MG TABS Take by mouth.   Yes [provider]  lovastatin (MEVACOR) 20 MG tablet Take 20 mg by mouth at bedtime.   Yes [provider]  mirtazapine (REMERON) 15 MG tablet Take 1 tablet (15 mg total) by mouth at bedtime. 07/31/21  Yes Ursula Alert, MD  Multiple Vitamins-Minerals (PRESERVISION AREDS) TABS Take 1 capsule by mouth 2 (two) times daily.   Yes [provider]  Omega-3 Fatty Acids (FISH OIL CONCENTRATE PO) Take 1 tablet by mouth daily.    Yes [provider]  Polyvinyl Alcohol-Povidone PF (REFRESH) 1.4-0.6 % SOLN Place 1 drop into both eyes in the morning and at bedtime.   Yes [provider]  Coenzyme Q10 (CO Q 10 PO) Take 1 tablet by mouth daily.  Patient not taking: Reported on 01/27/2022    [provider]  estradiol (ESTRACE) 0.1 MG/GM vaginal cream Place 1/2 gm vaginally twice weekly at bedtime. Patient not taking: Reported on 01/27/2022 05/20/21   Rubie Maid, MD  melatonin 5 MG TABS Take 1 tablet (5 mg total) by mouth every evening. Patient not taking: Reported on 01/27/2022 10/16/20   Max Sane, MD  OXYQUINOLONE SULFATE VAGINAL 0.025 % GEL Place 1 application vaginally 2 (two) times a week. 06/17/21   Rubie Maid, MD  tetrahydrozoline 0.05 % ophthalmic solution Place 1 drop into both eyes daily as needed (dry eyes). Patient not taking: Reported on 01/27/2022    [provider]   CT PELVIS WO CONTRAST  Result Date: 01/27/2022 CLINICAL DATA:  Left hip pain after fall.  Abnormal x-ray EXAM: CT PELVIS WITHOUT CONTRAST TECHNIQUE: Multidetector CT imaging of the pelvis was performed following the standard protocol without intravenous contrast. RADIATION DOSE REDUCTION: This exam was performed according to the departmental dose-optimization program which includes automated exposure control,  adjustment of the mA and/or kV according to patient size and/or use of iterative reconstruction technique. COMPARISON:  X-ray 01/27/2022, CT 01/26/2020 FINDINGS: Bones/Joint/Cartilage Acute moderately displaced fracture of the mid left inferior pubic ramus (series 3, image 96). Acute nondisplaced fracture at the left pubic root (series 3, image 70). Acute fracture of the left sacral ala (series 8, image 73). Pelvis intact without diastasis. Bilateral hips are intact without fracture or dislocation. Hip joint spaces are preserved. Mild degenerative changes of the bilateral SI joints. Partially imaged lower lumbar degenerative disc disease. Bones are demineralized. Ligaments Suboptimally assessed by CT. Muscles and Tendons No acute musculotendinous abnormality by CT. Soft tissues No pelvic sidewall hematoma. Mild induration within the subcutaneous soft tissues lateral to the left hip. No fluid collection or hematoma about the left hip. No inguinal lymphadenopathy. Diverticular changes of the  sigmoid colon. Pessary in place. Aortoiliac atherosclerosis. IMPRESSION: 1. Acute moderately displaced fracture of the mid left inferior pubic ramus. 2. Acute nondisplaced fracture at the left pubic root. 3. Acute fracture of the left sacral ala. 4. No pelvic diastasis. Aortic Atherosclerosis (ICD10-I70.0). Electronically Signed   By: Davina Poke D.O.   On: 01/27/2022 17:34   DG Chest 2 View  Result Date: 01/27/2022 CLINICAL DATA:  Shortness of breath EXAM: CHEST - 2 VIEW COMPARISON:  Chest x-ray dated October 04, 2020 FINDINGS: The heart size and mediastinal contours are within normal limits. Emphysema. No focal consolidation. The visualized skeletal structures are unremarkable. IMPRESSION: Background emphysema with no focal consolidation. Electronically Signed   By: Yetta Glassman M.D.   On: 01/27/2022 17:20   DG Hip Unilat With Pelvis 2-3 Views Left  Result Date: 01/27/2022 CLINICAL DATA:  Hip pain after a fall.  EXAM: DG HIP (WITH OR WITHOUT PELVIS) 2-3V LEFT COMPARISON:  None Available. FINDINGS: Bones are diffusely demineralized. SI joints and symphysis pubis unremarkable. Although not definite, nondisplaced fracture left inferior pubic ramus not excluded. AP and frog-leg lateral views of the left femur show no evidence for femoral neck fracture. IMPRESSION: 1. Possible nondisplaced fracture of the left inferior pubic ramus. Inlet and outlet views of the pelvis could be used to further evaluate as clinically warranted. 2. No evidence for left femoral neck fracture. Electronically Signed   By: Misty Stanley M.D.   On: 01/27/2022 16:25    Positive ROS: All other systems have been reviewed and were otherwise negative with the exception of those mentioned in the HPI and as above.  Physical Exam: General:  Alert, no acute distress Psychiatric:  Patient is competent for consent with normal mood and affect   Cardiovascular:  No pedal edema Respiratory:  No wheezing, non-labored breathing GI:  Abdomen is soft and non-tender Skin:  No lesions in the area of chief complaint Neurologic:  Sensation intact distally Lymphatic:  No axillary or cervical lymphadenopathy  Orthopedic Exam:  Orthopedic examination is limited to the left hip and lower extremity.  The left lower extremity is symmetric in alignment as compared to the right.  Skin inspection around the left hip is unremarkable.  No swelling, erythema, ecchymosis, abrasions, or other skin abnormalities are identified.  She has mild-moderate tenderness to palpation over the anterior aspect of the left hip.  She has more severe pain with attempted active flexion of the hip, but has only minimal discomfort with gentle logrolling of the left hip and lower extremity.  She is grossly neurovascularly intact to the left lower extremity and foot and that she can actively dorsiflex and plantarflex her toes and ankle, and sensation is intact to light touch to all  distributions.  However capillary refill to her foot is poor and I cannot palpate a posterior tibial artery pulse.  X-rays:  X-rays of the pelvis and left hip, as well as a CT scan of the pelvis is available for review and has been reviewed by myself.  The CT scan demonstrates mildly displaced left superior and inferior pubic rami fractures, but no evidence for any femoral neck or proximal femoral fracture.  No other acute bony abnormalities are identified.  Assessment: Minimally displaced left superior and inferior pubic rami fractures.  Plan: The treatment options have been discussed with the patient.  The patient is reassured that this is an injury that can be managed nonsurgically.  She may be mobilized with physical therapy over the next few  days as symptoms permit, weightbearing as tolerated on the left leg and using a walker for balance and support.  She may receive appropriate pain medication as deemed medically necessary.  Most likely, she will need to go to a skilled nursing facility, perhaps part of [place] where she is from.  Thank you for asking me to participate in the care of this most pleasant yet unfortunate woman.  I will be happy to follow her with you.   Pascal Lux, MD  Beeper #:  [phone]  01/27/2022 8:35 PM

## 2022-01-27 NOTE — Assessment & Plan Note (Signed)
Continue lovastatin 

## 2022-01-27 NOTE — Assessment & Plan Note (Addendum)
--  Likely related in part to pain --Continue atenolol with as needed antihypertensives to achieve goal SBP under 140

## 2022-01-27 NOTE — Plan of Care (Signed)

## 2022-01-27 NOTE — ED Provider Notes (Cosign Needed Addendum)
[facility] Provider Note    Event Date/Time   First MD Initiated Contact with Patient 01/27/22 1609     (approximate)   History   Chief Complaint Fall   HPI Angela Mason is a 88 y.o. female, history of COPD, hypertension, hyperlipidemia, depression, presents to the emergency department for evaluation of hip injury sustained from fall.  She states that she was inside her home when she turned around and lost her balance, causing her to fall onto her buttocks.  Denies any head injury or LOC.  She states that immediately after the event, she was unable to stand up due to the pain.  She was brought by EMS.  Denies any other injuries.  Denies fever/chills, leg swelling, chest pain, shortness of breath, abdominal pain, flank pain, nausea/vomiting, diarrhea, dysuria, headache, neck pain, back pain, or numbness/tingling in groin or lower extremities.  History Limitations: No limitations.        Physical Exam  Triage Vital Signs: ED Triage Vitals  Enc Vitals Group     BP 01/27/22 1551 (!) 173/72     Pulse Rate 01/27/22 1551 72     Resp 01/27/22 1551 (!) 24     Temp 01/27/22 1551 98.1 F (36.7 C)     Temp src --      SpO2 01/27/22 1551 90 %     Weight 01/27/22 1614 86 lb 3.2 oz (39.1 kg)     Height 01/27/22 1614 '4\' 10"'$  (1.473 m)     Head Circumference --      Peak Flow --      Pain Score 01/27/22 1541 6     Pain Loc --      Pain Edu? --      Excl. in [place]? --     Most recent vital signs: Vitals:   01/27/22 1921 01/27/22 2007  BP: (!) 153/83 (!) 167/91  Pulse: 74 72  Resp: 16 16  Temp:  98.4 F (36.9 C)  SpO2: 94% 94%    General: Awake, appears uncomfortable. Skin: Warm, dry. No rashes or lesions.  Eyes: PERRL. Conjunctivae normal.  CV: Good peripheral perfusion.  Resp: Normal effort.  Abd: Soft, non-tender. No distention.  Neuro: At baseline. No gross neurological deficits.   Focused Exam: Patient is able to stand, but is unable to  ambulate on her own without significant pain.  Physical Exam    ED Results / Procedures / Treatments  Labs (all labs ordered are listed, but only abnormal results are displayed) Labs Reviewed  CBC WITH DIFFERENTIAL/PLATELET - Abnormal; Notable for the following components:      Result Value   WBC 20.5 (*)    Neutro Abs 18.5 (*)    Abs Immature Granulocytes 0.35 (*)    All other components within normal limits  COMPREHENSIVE METABOLIC PANEL - Abnormal; Notable for the following components:   Sodium 134 (*)    Chloride 97 (*)    Glucose, Bld 106 (*)    BUN 31 (*)    All other components within normal limits  BRAIN NATRIURETIC PEPTIDE - Abnormal; Notable for the following components:   B Natriuretic Peptide 163.0 (*)    All other components within normal limits  RESP PANEL BY RT-PCR (FLU A&B, COVID) [id number]  URINALYSIS, COMPLETE (UACMP) WITH MICROSCOPIC  TROPONIN I (HIGH SENSITIVITY)  TROPONIN I (HIGH SENSITIVITY)     EKG Sinus rhythm, rate 68, no ST segment changes, no axis deviations, normal QRS interval, no AV  blocks.   RADIOLOGY  ED Provider Interpretation: I personally viewed and interpreted this CT scan, mildly displaced fracture of the mid left inferior pubic ramus noted.  Chest x-ray shows no focal consolidations.  CT PELVIS WO CONTRAST  Result Date: 01/27/2022 CLINICAL DATA:  Left hip pain after fall.  Abnormal x-ray EXAM: CT PELVIS WITHOUT CONTRAST TECHNIQUE: Multidetector CT imaging of the pelvis was performed following the standard protocol without intravenous contrast. RADIATION DOSE REDUCTION: This exam was performed according to the departmental dose-optimization program which includes automated exposure control, adjustment of the mA and/or kV according to patient size and/or use of iterative reconstruction technique. COMPARISON:  X-ray 01/27/2022, CT 01/26/2020 FINDINGS: Bones/Joint/Cartilage Acute moderately displaced fracture of the mid left inferior pubic ramus  (series 3, image 96). Acute nondisplaced fracture at the left pubic root (series 3, image 70). Acute fracture of the left sacral ala (series 8, image 73). Pelvis intact without diastasis. Bilateral hips are intact without fracture or dislocation. Hip joint spaces are preserved. Mild degenerative changes of the bilateral SI joints. Partially imaged lower lumbar degenerative disc disease. Bones are demineralized. Ligaments Suboptimally assessed by CT. Muscles and Tendons No acute musculotendinous abnormality by CT. Soft tissues No pelvic sidewall hematoma. Mild induration within the subcutaneous soft tissues lateral to the left hip. No fluid collection or hematoma about the left hip. No inguinal lymphadenopathy. Diverticular changes of the sigmoid colon. Pessary in place. Aortoiliac atherosclerosis. IMPRESSION: 1. Acute moderately displaced fracture of the mid left inferior pubic ramus. 2. Acute nondisplaced fracture at the left pubic root. 3. Acute fracture of the left sacral ala. 4. No pelvic diastasis. Aortic Atherosclerosis (ICD10-I70.0). Electronically Signed   By: Davina Poke D.O.   On: 01/27/2022 17:34   DG Chest 2 View  Result Date: 01/27/2022 CLINICAL DATA:  Shortness of breath EXAM: CHEST - 2 VIEW COMPARISON:  Chest x-ray dated October 04, 2020 FINDINGS: The heart size and mediastinal contours are within normal limits. Emphysema. No focal consolidation. The visualized skeletal structures are unremarkable. IMPRESSION: Background emphysema with no focal consolidation. Electronically Signed   By: Yetta Glassman M.D.   On: 01/27/2022 17:20   DG Hip Unilat With Pelvis 2-3 Views Left  Result Date: 01/27/2022 CLINICAL DATA:  Hip pain after a fall. EXAM: DG HIP (WITH OR WITHOUT PELVIS) 2-3V LEFT COMPARISON:  None Available. FINDINGS: Bones are diffusely demineralized. SI joints and symphysis pubis unremarkable. Although not definite, nondisplaced fracture left inferior pubic ramus not excluded. AP and  frog-leg lateral views of the left femur show no evidence for femoral neck fracture. IMPRESSION: 1. Possible nondisplaced fracture of the left inferior pubic ramus. Inlet and outlet views of the pelvis could be used to further evaluate as clinically warranted. 2. No evidence for left femoral neck fracture. Electronically Signed   By: Misty Stanley M.D.   On: 01/27/2022 16:25    PROCEDURES:  Critical Care performed: N/A.  Procedures    MEDICATIONS ORDERED IN ED: Medications  atenolol (TENORMIN) tablet 25 mg (has no administration in time range)  pravastatin (PRAVACHOL) tablet 20 mg (has no administration in time range)  ARIPiprazole (ABILIFY) tablet 2 mg (has no administration in time range)  mirtazapine (REMERON) tablet 15 mg (has no administration in time range)  HYDROcodone-acetaminophen (NORCO/VICODIN) 5-325 MG per tablet 1-2 tablet (has no administration in time range)  morphine (PF) 2 MG/ML injection 0.5 mg (has no administration in time range)  ondansetron (ZOFRAN) tablet 4 mg (has no administration in time range)  Or  ondansetron (ZOFRAN) injection 4 mg (has no administration in time range)  enoxaparin (LOVENOX) injection 30 mg (has no administration in time range)  bisacodyl (DULCOLAX) EC tablet 5 mg (has no administration in time range)  albuterol (PROVENTIL) (2.5 MG/3ML) 0.083% nebulizer solution 2.5 mg (has no administration in time range)  HYDROcodone-acetaminophen (NORCO/VICODIN) 5-325 MG per tablet 1 tablet (1 tablet Oral Given 01/27/22 1911)     IMPRESSION / MDM / ASSESSMENT AND PLAN / ED COURSE  I reviewed the triage vital signs and the nursing notes.                              Differential diagnosis includes, but is not limited to, hip fracture, pubic ramus fracture, femur fracture, pneumonia, COPD.   ED Course Patient appears well at rest, notably hypertensive at 205/85.  Patient states that her blood pressure is chronically high and is currently on atenolol  for it.  SPO2 briefly dropped to 88% on room air, though is 100% at 2 L/min.  At 1 L/min she is at 95%.  Patient states she does not feel short of breath.  She is not normally on O2 at home.  CBC shows notable leukocytosis at 20.5, likely secondary to trauma.  No anemia present.  BNP 163, consistent with previous values.  CMP shows no significant electrolyte abnormalities, transaminitis, or AKI.  Initial troponin 9, pending second troponin.  EKG unremarkable.  Assessment/Plan Patient presents with acute moderately displaced fracture of the mid left inferior pubic ramus and acute nondisplaced fracture of the left pubic root, secondary to mechanical fall earlier today.  She is unable to ambulate on her own without significant pain.  Spoke with the on-call orthopedic surgeon, Dr. Roland Rack, who advised that surgery was not needed at this time but will follow-up as needed.  We will plan to admit this patient for pain control at this time.  Patient does appear to have a low SPO2 around 88% to 90% at baseline.  She is not currently short of breath.  SPO2 95 to 98% at 1 L/min. Chest x-ray shows no evidence of focal consolidations.  I suspect this is likely her baseline given her underlying COPD.  Very low suspicion for any significant pathology.  Spoke with the on-call hospitalist, Dr. Damita Dunnings, who agreed to admit.  Patient's presentation is most consistent with acute illness / injury with system symptoms.        FINAL CLINICAL IMPRESSION(S) / ED DIAGNOSES   Final diagnoses:  Closed fracture of ramus of left pubis, initial encounter ([place])     Rx / DC Orders   ED Discharge Orders     None        Note:  This document was prepared using Dragon voice recognition software and may include unintentional dictation errors.   Teodoro Spray, [place] 01/27/22 [place], [place], PA 01/27/22 2019    Duffy Bruce, MD 01/29/22 1105

## 2022-01-27 NOTE — Assessment & Plan Note (Addendum)
--   Ex smoker  Over 15 years ago. --O2 sat 88% in the ED improving to 95% with 2 L. Weaned to RA --Chest x-ray showing emphysema, but no acute finding.  No wheezes, dyspnea, cough to suggest exacerbation.  Pt likely has chronic hypoxemic respiratory failure Plan: --Continue supplemental O2 to keep sats between 88-92%, wean as tolerated --DuoNebs TID

## 2022-01-27 NOTE — Assessment & Plan Note (Signed)
Displaced fracture of the mid left inferior pubic ramus. Nondisplaced fracture at the left pubic root. Fracture of the left sacral ala. Nonoperative/conservative management per Ortho consult from the ED Pain control PT/OT eval and TOC consult We will consult Ortho for further recommendations

## 2022-01-27 NOTE — Assessment & Plan Note (Signed)
Continue levothyroxine 

## 2022-01-27 NOTE — H&P (Signed)
History and Physical    Patient: Angela Mason [id number]:[id number] DOB: 06/22/1933 DOA: 01/27/2022 DOS: the patient was seen and examined on 01/27/2022 PCP: Albina Billet, MD  Patient coming from: Home  Chief Complaint:  Chief Complaint  Patient presents with   Fall    HPI: Angela Mason is a 88 y.o. female with medical history significant for HLD, hypothyroidism and hypertension who presents to the ED for evaluation of left hip pain following a fall.  Patient describes losing her balance and falling after turning and is now unable to bear weight on her left leg.  She was previously in her usual state of health and denied preceding lightheadedness, headache or blurred vision, chest pain, shortness of breath or palpitations. ED course and data review: BP 205/85 with tachypnea 22-24, O2 sat 88% to 90% improving to 95% on 2 L.  Pulse 72 and afebrile. Labs significant for WBC 20,000, troponin 9, BNP 163 EKG, personally viewed and interpreted: NSR at 68 with no acute ST-T wave changes Imaging: CXR with background emphysema and no focal consolidation CT pelvis without contrast with the following findings IMPRESSION: 1. Acute moderately displaced fracture of the mid left inferior pubic ramus. 2. Acute nondisplaced fracture at the left pubic root. 3. Acute fracture of the left sacral ala. 4. No pelvic diastasis.   The ED provider spoke with orthopedist Dr. Roland Rack who recommended nonsurgical treatment but will follow as needed and advised admission to the hospitalist service for pain control.     Past Medical History:  Diagnosis Date   Cancer ([place])    SKIN   COPD, mild (HCC)    Dyspnea    Edema    HLD (hyperlipidemia)    HTN (hypertension)    Hypothyroidism    Macular degeneration, bilateral    Varicosities    Past Surgical History:  Procedure Laterality Date   CATARACT EXTRACTION W/PHACO Right 02/03/2017   Procedure: CATARACT EXTRACTION PHACO AND INTRAOCULAR LENS PLACEMENT ([place]);   Surgeon: Birder Robson, MD;  Location: [facility] ORS;  Service: Ophthalmology;  Laterality: Right;  [place] 00:57.2 AP% 18.5 CDE 10.58 Fluid pack lot # [id number]   CATARACT EXTRACTION W/PHACO Left 02/24/2017   Procedure: CATARACT EXTRACTION PHACO AND INTRAOCULAR LENS PLACEMENT ([facility]);  Surgeon: Birder Robson, MD;  Location: [facility] ORS;  Service: Ophthalmology;  Laterality: Left;  [place] 01:12 AP% 25.0 CDE 18.03 Fluid pack lot # [id number]   MOHS SURGERY  2015   nose   SALIVARY GLAND SURGERY Left 2012   TONSILLECTOMY  1940   Social History:  reports that she quit smoking about 16 years ago. Her smoking use included cigarettes. She has never used smokeless tobacco. She reports current alcohol use. She reports that she does not use drugs.  Allergies  Allergen Reactions   Gentamicin    Levofloxacin Diarrhea    Family History  Problem Relation Age of Onset   Diverticulitis Mother    Macular degeneration Mother    COPD Father    Heart failure Father    Epilepsy Sister    Parkinson's disease Brother    Cancer Neg Hx    Diabetes Neg Hx    Breast cancer Neg Hx     Prior to Admission medications   Medication Sig Start Date End Date Taking? Authorizing Provider  ARIPiprazole (ABILIFY) 2 MG tablet Take 1 tablet (2 mg total) by mouth daily with breakfast. 07/31/21   Ursula Alert, MD  atenolol (TENORMIN) 25 MG tablet Take 1 tablet (25 mg total) by  mouth daily. 10/16/20   Max Sane, MD  calcium-vitamin D (OSCAL WITH D) 500-200 MG-UNIT tablet Take 1 tablet by mouth 2 (two) times daily.    [provider]  Coenzyme Q10 (CO Q 10 PO) Take 1 tablet by mouth daily.     [provider]  estradiol (ESTRACE) 0.1 MG/GM vaginal cream Place 1/2 gm vaginally twice weekly at bedtime. 05/20/21   Rubie Maid, MD  Ferrous Sulfate Dried (HIGH POTENCY IRON) 65 MG TABS Take by mouth.    [provider]  lovastatin (MEVACOR) 20 MG tablet Take 20 mg by mouth at bedtime.    [provider]  melatonin 5 MG TABS Take 1 tablet (5 mg total) by mouth every evening. 10/16/20   Max Sane, MD  mirtazapine (REMERON) 15 MG tablet Take 1 tablet (15 mg total) by mouth at bedtime. 07/31/21   Ursula Alert, MD  Multiple Vitamins-Minerals (PRESERVISION AREDS) TABS Take 1 capsule by mouth 2 (two) times daily.    [provider]  Omega-3 Fatty Acids (FISH OIL CONCENTRATE PO) Take 1 tablet by mouth daily.     [provider]  OXYQUINOLONE SULFATE VAGINAL 0.025 % GEL Place 1 application vaginally 2 (two) times a week. 06/17/21   Rubie Maid, MD  tetrahydrozoline 0.05 % ophthalmic solution Place 1 drop into both eyes daily as needed (dry eyes).    [provider]    Physical Exam: Vitals:   01/27/22 1551 01/27/22 1614 01/27/22 1622 01/27/22 1624  BP: (!) 173/72   (!) 205/85  Pulse: 72   73  Resp: (!) 24   (!) 22  Temp: 98.1 F (36.7 C)     SpO2: 90%  (!) 88% 95%  Weight:  39.1 kg    Height:  '4\' 10"'$  (1.473 m)     Physical Exam Vitals and nursing note reviewed.  Constitutional:      General: She is not in acute distress. HENT:     Head: Normocephalic and atraumatic.  Cardiovascular:     Rate and Rhythm: Normal rate and regular rhythm.     Heart sounds: Normal heart sounds.  Pulmonary:     Effort: Pulmonary effort is normal. Tachypnea present.     Breath sounds: Wheezing present.  Abdominal:     Palpations: Abdomen is soft.     Tenderness: There is no abdominal tenderness.  Neurological:     Mental Status: Mental status is at baseline.     Labs on Admission: I have personally reviewed following labs and imaging studies  CBC: Recent Labs  Lab 01/27/22 1732  WBC 20.5*  NEUTROABS 18.5*  HGB 12.9  HCT 39.7  MCV 98.3  PLT 194   Basic Metabolic Panel: Recent Labs  Lab 01/27/22 1732  NA 134*  K 4.2  CL 97*  CO2 31  GLUCOSE 106*  BUN 31*  CREATININE 0.81  CALCIUM 9.0   GFR: Estimated Creatinine Clearance: 30.2 mL/min (by  C-G formula based on SCr of 0.81 mg/dL). Liver Function Tests: Recent Labs  Lab 01/27/22 1732  AST 26  ALT 25  ALKPHOS 65  BILITOT 0.7  PROT 6.8  ALBUMIN 3.8   No results for input(s): "LIPASE", "AMYLASE" in the last 168 hours. No results for input(s): "AMMONIA" in the last 168 hours. Coagulation Profile: No results for input(s): "INR", "PROTIME" in the last 168 hours. Cardiac Enzymes: No results for input(s): "CKTOTAL", "CKMB", "CKMBINDEX", "TROPONINI" in the last 168 hours. BNP (last 3 results) No results  for input(s): "PROBNP" in the last 8760 hours. HbA1C: No results for input(s): "HGBA1C" in the last 72 hours. CBG: No results for input(s): "GLUCAP" in the last 168 hours. Lipid Profile: No results for input(s): "CHOL", "HDL", "LDLCALC", "TRIG", "CHOLHDL", "LDLDIRECT" in the last 72 hours. Thyroid Function Tests: No results for input(s): "TSH", "T4TOTAL", "FREET4", "T3FREE", "THYROIDAB" in the last 72 hours. Anemia Panel: No results for input(s): "VITAMINB12", "FOLATE", "FERRITIN", "TIBC", "IRON", "RETICCTPCT" in the last 72 hours. Urine analysis:    Component Value Date/Time   COLORURINE STRAW (A) 10/04/2020 1230   APPEARANCEUR CLEAR (A) 10/04/2020 1230   APPEARANCEUR Clear 09/10/2015 1417   LABSPEC 1.008 10/04/2020 1230   PHURINE 6.0 10/04/2020 1230   GLUCOSEU NEGATIVE 10/04/2020 1230   HGBUR NEGATIVE 10/04/2020 1230   BILIRUBINUR neg 10/23/2020 1206   BILIRUBINUR Negative 09/10/2015 1417   KETONESUR 20 (A) 10/04/2020 1230   PROTEINUR Negative 10/23/2020 1206   PROTEINUR NEGATIVE 10/04/2020 1230   UROBILINOGEN 0.2 10/23/2020 1206   NITRITE neg 10/23/2020 1206   NITRITE NEGATIVE 10/04/2020 1230   LEUKOCYTESUR Large (3+) (A) 10/23/2020 1206   LEUKOCYTESUR NEGATIVE 10/04/2020 1230    Radiological Exams on Admission: CT PELVIS WO CONTRAST  Result Date: 01/27/2022 CLINICAL DATA:  Left hip pain after fall.  Abnormal x-ray EXAM: CT PELVIS WITHOUT CONTRAST  TECHNIQUE: Multidetector CT imaging of the pelvis was performed following the standard protocol without intravenous contrast. RADIATION DOSE REDUCTION: This exam was performed according to the departmental dose-optimization program which includes automated exposure control, adjustment of the mA and/or kV according to patient size and/or use of iterative reconstruction technique. COMPARISON:  X-ray 01/27/2022, CT 01/26/2020 FINDINGS: Bones/Joint/Cartilage Acute moderately displaced fracture of the mid left inferior pubic ramus (series 3, image 96). Acute nondisplaced fracture at the left pubic root (series 3, image 70). Acute fracture of the left sacral ala (series 8, image 73). Pelvis intact without diastasis. Bilateral hips are intact without fracture or dislocation. Hip joint spaces are preserved. Mild degenerative changes of the bilateral SI joints. Partially imaged lower lumbar degenerative disc disease. Bones are demineralized. Ligaments Suboptimally assessed by CT. Muscles and Tendons No acute musculotendinous abnormality by CT. Soft tissues No pelvic sidewall hematoma. Mild induration within the subcutaneous soft tissues lateral to the left hip. No fluid collection or hematoma about the left hip. No inguinal lymphadenopathy. Diverticular changes of the sigmoid colon. Pessary in place. Aortoiliac atherosclerosis. IMPRESSION: 1. Acute moderately displaced fracture of the mid left inferior pubic ramus. 2. Acute nondisplaced fracture at the left pubic root. 3. Acute fracture of the left sacral ala. 4. No pelvic diastasis. Aortic Atherosclerosis (ICD10-I70.0). Electronically Signed   By: Davina Poke D.O.   On: 01/27/2022 17:34   DG Chest 2 View  Result Date: 01/27/2022 CLINICAL DATA:  Shortness of breath EXAM: CHEST - 2 VIEW COMPARISON:  Chest x-ray dated October 04, 2020 FINDINGS: The heart size and mediastinal contours are within normal limits. Emphysema. No focal consolidation. The visualized skeletal  structures are unremarkable. IMPRESSION: Background emphysema with no focal consolidation. Electronically Signed   By: Yetta Glassman M.D.   On: 01/27/2022 17:20   DG Hip Unilat With Pelvis 2-3 Views Left  Result Date: 01/27/2022 CLINICAL DATA:  Hip pain after a fall. EXAM: DG HIP (WITH OR WITHOUT PELVIS) 2-3V LEFT COMPARISON:  None Available. FINDINGS: Bones are diffusely demineralized. SI joints and symphysis pubis unremarkable. Although not definite, nondisplaced fracture left inferior pubic ramus not excluded. AP and frog-leg lateral views of  the left femur show no evidence for femoral neck fracture. IMPRESSION: 1. Possible nondisplaced fracture of the left inferior pubic ramus. Inlet and outlet views of the pelvis could be used to further evaluate as clinically warranted. 2. No evidence for left femoral neck fracture. Electronically Signed   By: Misty Stanley M.D.   On: 01/27/2022 16:25     Data Reviewed: Relevant notes from primary care and specialist visits, past discharge summaries as available in EHR, including Care Everywhere. Prior diagnostic testing as pertinent to current admission diagnoses Updated medications and problem lists for reconciliation ED course, including vitals, labs, imaging, treatment and response to treatment Triage notes, nursing and pharmacy notes and ED provider's notes Notable results as noted in HPI   Assessment and Plan: * Pubic ramus fracture, left, closed, initial encounter ([place]) Displaced fracture of the mid left inferior pubic ramus. Nondisplaced fracture at the left pubic root. Fracture of the left sacral ala. Nonoperative/conservative management per Ortho consult from the ED Pain control PT/OT eval and TOC consult We will consult Ortho for further recommendations   Accidental fall Fall appears to be accidental PT evaluation   Hypoxia Suspect COPD/emphysema with mild exacerbation  Ex smoker  Over 15 years ago. O2 sat 88% in the ED improving  to 95% with 2 L.  tachypneic with mild wheezing on exam Chest x-ray showing emphysema Continue supplemental oxygen to keep sats over 90% DuoNebs as needed  Hypertensive urgency Likely related in part to pain Pain control Continue atenolol with as needed antihypertensives to achieve goal SBP under 140  Leukocytosis Suspect secondary to fall.  No stigmata of infection Chest x-ray clear.  Urinalysis pending Follow-up urinalysis to evaluate for possible UTI  Hypothyroidism Continue levothyroxine  HLD (hyperlipidemia) Continue lovastatin    DVT prophylaxis: Lovenox  Consults: Orthopedics, Dr. Roland Rack  Advance Care Planning:   Code Status: Prior   Family Communication: none  Disposition Plan: Back to previous home environment  Severity of Illness: The appropriate patient status for this patient is INPATIENT. Inpatient status is judged to be reasonable and necessary in order to provide the required intensity of service to ensure the patient's safety. The patient's presenting symptoms, physical exam findings, and initial radiographic and laboratory data in the context of their chronic comorbidities is felt to place them at high risk for further clinical deterioration. Furthermore, it is not anticipated that the patient will be medically stable for discharge from the hospital within 2 midnights of admission.   * I certify that at the point of admission it is my clinical judgment that the patient will require inpatient hospital care spanning beyond 2 midnights from the point of admission due to high intensity of service, high risk for further deterioration and high frequency of surveillance required.*  Author: Athena Masse, MD 01/27/2022 7:18 PM  For on call review [email or web address].

## 2022-01-27 NOTE — Assessment & Plan Note (Signed)
Fall appears to be accidental PT evaluation

## 2022-01-27 NOTE — ED Triage Notes (Signed)
Pt comes via EMs from home with c/o left hip pain. Pt turned and lost balance. Pt unable to bear weight. Feet are blue but normal. VSS

## 2022-01-27 NOTE — Assessment & Plan Note (Addendum)
Suspect secondary to fall.  No stigmata of infection Chest x-ray clear.  Urinalysis pending Follow-up urinalysis to evaluate for possible UTI

## 2022-01-28 DIAGNOSIS — S32592A Other specified fracture of left pubis, initial encounter for closed fracture: Secondary | ICD-10-CM | POA: Diagnosis not present

## 2022-01-28 LAB — URINALYSIS, COMPLETE (UACMP) WITH MICROSCOPIC
Bilirubin Urine: NEGATIVE
Bilirubin Urine: NEGATIVE
Glucose, UA: NEGATIVE mg/dL
Glucose, UA: NEGATIVE mg/dL
Ketones, ur: NEGATIVE mg/dL
Ketones, ur: NEGATIVE mg/dL
Nitrite: POSITIVE — AB
Nitrite: POSITIVE — AB
Protein, ur: NEGATIVE mg/dL
Protein, ur: NEGATIVE mg/dL
Specific Gravity, Urine: 1.013 (ref 1.005–1.030)
Specific Gravity, Urine: 1.011 (ref 1.005–1.030)
WBC, UA: >50 WBC/hpf — ABNORMAL HIGH (ref 0–5)
WBC, UA: >50 WBC/hpf — ABNORMAL HIGH (ref 0–5)
pH: 5 (ref 5.0–8.0)
pH: 5 (ref 5.0–8.0)

## 2022-01-28 MED ORDER — HYDROCODONE-ACETAMINOPHEN 5-325 MG PO TABS
1.0000 | ORAL_TABLET | Freq: Four times a day (QID) | ORAL | Status: DC | PRN
  Administered 2022-01-28 – 2022-01-30 (×2): 1 via ORAL
  Filled 2022-01-28 (×2): qty 1

## 2022-01-28 MED ORDER — OYSTER SHELL CALCIUM/D3 500-5 MG-MCG PO TABS
1.0000 | ORAL_TABLET | Freq: Every day | ORAL | Status: DC
  Administered 2022-01-28 – 2022-02-01 (×5): 1 via ORAL
  Filled 2022-01-28 (×5): qty 1

## 2022-01-28 MED ORDER — POLYVINYL ALCOHOL 1.4 % OP SOLN
1.0000 [drp] | Freq: Every day | OPHTHALMIC | Status: DC
  Administered 2022-01-28 – 2022-01-31 (×4): 1 [drp] via OPHTHALMIC
  Filled 2022-01-28: qty 15

## 2022-01-28 MED ORDER — CEPHALEXIN 500 MG PO CAPS
500.0000 mg | ORAL_CAPSULE | Freq: Three times a day (TID) | ORAL | Status: DC
  Administered 2022-01-28 – 2022-01-29 (×4): 500 mg via ORAL
  Filled 2022-01-28 (×4): qty 1

## 2022-01-28 MED ORDER — ADULT MULTIVITAMIN W/MINERALS CH
1.0000 | ORAL_TABLET | Freq: Two times a day (BID) | ORAL | Status: DC
  Administered 2022-01-28 – 2022-02-01 (×9): 1 via ORAL
  Filled 2022-01-28 (×9): qty 1

## 2022-01-28 MED ORDER — TRAMADOL HCL 50 MG PO TABS
50.0000 mg | ORAL_TABLET | Freq: Four times a day (QID) | ORAL | Status: DC | PRN
  Administered 2022-01-28: 50 mg via ORAL
  Filled 2022-01-28: qty 1

## 2022-01-28 MED ORDER — OMEGA-3-ACID ETHYL ESTERS 1 G PO CAPS
1.0000 g | ORAL_CAPSULE | Freq: Every day | ORAL | Status: DC
  Administered 2022-01-28 – 2022-02-01 (×5): 1 g via ORAL
  Filled 2022-01-28 (×5): qty 1

## 2022-01-28 MED ORDER — ENSURE ENLIVE PO LIQD
237.0000 mL | Freq: Two times a day (BID) | ORAL | Status: DC
  Administered 2022-01-28 – 2022-02-01 (×8): 237 mL via ORAL

## 2022-01-28 NOTE — Progress Notes (Signed)
  Progress Note   Patient: Angela Mason [id number] DOB: [birth year] DOA: 01/27/2022     1 DOS: the patient was seen and examined on 01/28/2022   Brief hospital course: Angela Mason is a 88 y.o. female with medical history significant for HLD, hypothyroidism and hypertension who presents to the ED for evaluation of left hip pain following a fall.  Patient describes losing her balance and falling after turning and is now unable to bear weight on her left leg.   Assessment and Plan: * Pubic ramus fracture, left, closed, initial encounter ([place]) Displaced fracture of the mid left inferior pubic ramus. Nondisplaced fracture at the left pubic root. Fracture of the left sacral ala. --Nonoperative/conservative management per Ortho consult from the ED --Pain control --PT/OT eval and TOC consult - consult with Dr Roland Rack from Ortho with recommendations appreciated   Accidental fall --Fall appears to be accidental PT evaluation   Hypoxia Suspect COPD/emphysema with mild exacerbation -- Ex smoker  Over 15 years ago. --O2 sat 88% in the ED improving to 95% with 2 L. Wean to RA --incentive spirometer tachypneic with mild wheezing on exam --Chest x-ray showing emphysema --Continue supplemental oxygen to keep sats over 90% --DuoNebs as needed  Hypertensive urgency --Likely related in part to pain --Pain control --Continue atenolol with as needed antihypertensives to achieve goal SBP under 140 --BP better today  Leukocytosis --Suspect secondary to fall.   --UA abnormal with pt reporting some odor to it. H/o freq UIT --will start empiric keflex x5 days --Chest x-ray clear.     Hypothyroidism --Continue levothyroxine  HLD (hyperlipidemia) --Continue lovastatin        Subjective: no family at bedside. Feels upset with her fall  Physical Exam: Vitals:   01/28/22 0018 01/28/22 0513 01/28/22 0816 01/28/22 1209  BP: 121/75 (!) 149/66 (!) 174/64 (!) 179/82  Pulse: 67 62 74 71   Resp:  '15 16 15  '$ Temp:   98.7 F (37.1 C) 98 F (36.7 C)  TempSrc:      SpO2:  100% 98% 100%  Weight:      Height:       Cardiovascular:     Rate and Rhythm: Normal rate and regular rhythm.     Heart sounds: Normal heart sounds.  Pulmonary:     Effort: Pulmonary effort is normal.  normal  Breath sounds:  Abdominal:     Palpations: Abdomen is soft.     Tenderness: There is no abdominal tenderness.  Neurological:     Mental Status: Mental status is at baseline.   Family Communication: dter lives in [place]  Disposition: Status is: Inpatient Remains inpatient appropriate because: Pubic rami fracture. PT/OT to see pt  Planned Discharge Destination:  TBD    Time spent: 35 minutes  Author: Fritzi Mandes, MD 01/28/2022 12:38 PM  For on call review [email or web address].

## 2022-01-28 NOTE — Hospital Course (Signed)
Angela Mason is a 88 y.o. female with medical history significant for HLD, hypothyroidism and hypertension who presents to the ED for evaluation of left hip pain following a fall.  Patient describes losing her balance and falling after turning and is now unable to bear weight on her left leg.

## 2022-01-28 NOTE — TOC CM/SW Note (Addendum)
CSW acknowledges consult for home health/DME needs. Please consult PT/OT when appropriate so patient can be evaluated.  Dayton Scrape, [place]  11:27 am: Received call from Golinda, RN at [facility]. She confirmed patient is a resident in their facility and lives alone. Patient does not have any local family. Daughter lives in [place]. She anticipates patient will need to come to their SNF side at discharge. PT/OT consults placed this morning.  Dayton Scrape, [place]

## 2022-01-28 NOTE — Progress Notes (Signed)
Initial Nutrition Assessment  DOCUMENTATION CODES:   Severe malnutrition in context of chronic illness  INTERVENTION:   Ensure Enlive po BID, each supplement provides 350 kcal and 20 grams of protein.  MVI po daily   Liberalize diet   NUTRITION DIAGNOSIS:   Severe Malnutrition related to catabolic illness (COPD) as evidenced by severe muscle depletion, severe fat depletion.  GOAL:   Patient will meet greater than or equal to 90% of their needs  MONITOR:   PO intake, Supplement acceptance, Labs, Weight trends, Skin, I & O's  REASON FOR ASSESSMENT:   Consult Hip fracture protocol  ASSESSMENT:   88 y/o female with h/o COPD, HTN, HLD, MDD and hypothyroidism who is admitted with hip fracture after fall.  Met with pt in room today. Pt with numerous complaints today. Pt reports that she is not feeling well; pt reports that she uncomfortable and unable to move herself into a new position. Pt is unhappy that she had to wait a long time for a pillow and that she has not yet received the tissues that she asked for. Pt reports good appetite but is unhappy that she is not receiving food that she likes from the kitchen. Pt has ordered salmon for dinner. Pt reports that she does not eat much at baseline and that she tries to follow a low salt diet at home. Pt reports that she does not drink supplements but she is willing to try them in hospital. RD discussed with pt the importance of adequate nutrition needed to support healing and to preserve lean muscle. RD will add supplements and liberalize pt's diet. Per chart, pt appears weight stable pta. Plan is for medical management of hip fracture.   Medications reviewed and include: oscal w/ D, cephalexin, lovenox, remeron, lovaza, MVI  Labs reviewed: Na 134(L), K 4.2 wnl Wbc- 20.5(H)  NUTRITION - FOCUSED PHYSICAL EXAM:  Flowsheet Row Most Recent Value  Orbital Region Moderate depletion  Upper Arm Region Severe depletion  Thoracic and Lumbar  Region Severe depletion  Buccal Region Moderate depletion  Temple Region Moderate depletion  Clavicle Bone Region Severe depletion  Clavicle and Acromion Bone Region Severe depletion  Scapular Bone Region Severe depletion  Dorsal Hand Severe depletion  Patellar Region Severe depletion  Anterior Thigh Region Severe depletion  Posterior Calf Region Severe depletion  Edema (RD Assessment) None  Hair Reviewed  Eyes Reviewed  Mouth Reviewed  Skin Reviewed  Nails Reviewed   Diet Order:   Diet Order             Diet regular Room service appropriate? Yes; Fluid consistency: Thin  Diet effective now                  EDUCATION NEEDS:   Education needs have been addressed  Skin:  Skin Assessment: Reviewed RN Assessment  Last BM:  7/3  Height:   Ht Readings from Last 1 Encounters:  01/27/22 $RemoveB'4\' 10"'rlWMvSNN$  (1.473 m)    Weight:   Wt Readings from Last 1 Encounters:  01/27/22 39.1 kg    Ideal Body Weight:  44.5 kg  BMI:  Body mass index is 18.02 kg/m.  Estimated Nutritional Needs:   Kcal:  1200-1400kcal/day  Protein:  60-70g/day  Fluid:  1.0-1.2L/day  Koleen Distance MS, RD, LDN Please refer to [facility] for RD and/or RD on-call/weekend/after hours pager

## 2022-01-29 ENCOUNTER — Encounter: Payer: Self-pay | Admitting: Internal Medicine

## 2022-01-29 DIAGNOSIS — S32592A Other specified fracture of left pubis, initial encounter for closed fracture: Secondary | ICD-10-CM | POA: Diagnosis not present

## 2022-01-29 DIAGNOSIS — E43 Unspecified severe protein-calorie malnutrition: Secondary | ICD-10-CM | POA: Insufficient documentation

## 2022-01-29 LAB — CBC
HCT: 33.8 % — ABNORMAL LOW (ref 36.0–46.0)
Hemoglobin: 10.8 g/dL — ABNORMAL LOW (ref 12.0–15.0)
MCH: 32 pg (ref 26.0–34.0)
MCHC: 32 g/dL (ref 30.0–36.0)
MCV: 100 fL (ref 80.0–100.0)
Platelets: 164 10*3/uL (ref 150–400)
RBC: 3.38 MIL/uL — ABNORMAL LOW (ref 3.87–5.11)
RDW: 12.7 % (ref 11.5–15.5)
WBC: 12.4 10*3/uL — ABNORMAL HIGH (ref 4.0–10.5)
nRBC: 0 % (ref 0.0–0.2)

## 2022-01-29 MED ORDER — PSYLLIUM 95 % PO PACK
1.0000 | PACK | Freq: Two times a day (BID) | ORAL | Status: DC
Start: 2022-01-29 — End: 2022-02-01
  Administered 2022-01-29 – 2022-02-01 (×5): 1 via ORAL
  Filled 2022-01-29 (×6): qty 1

## 2022-01-29 NOTE — Evaluation (Signed)
Physical Therapy Evaluation Patient Details Name: Angela Mason MRN: [id number] DOB: 1933/03/22 Today's Date: 01/29/2022  History of Present Illness  Angela Mason is an 78yoF who comes to [facility] on 01/29/22 after a fall at home at [facility]. Imaging revealing of Left inferior pubic ramus fracture, left pubic root fracture, left sacral ala fracture. PMH: HLD, HTN, hypoTSH, COPD. Orthopedics recommending conservative management of these fractures. Pt is recently widowed, typically uses a RW outside of home, no device in home. Per Dr. Roland Rack the patient can be WBAT.  Clinical Impression  Pt admitted with above diagnosis. Pt currently with functional limitations due to the deficits listed below (see "PT Problem List"). Upon entry, pt in bed, awake and agreeable to participate. The pt is alert, pleasant, interactive, and able to provide info regarding prior level of function, both in tolerance and independence. Pt requires minA of 1 to EOB, then MinA of 1-2 for rising to standing. PT unable to achieve independent upright standing balance while up, persistent posterior lean. PT able to step pivot to reliner with a lot of help over 1-2 minutes. Pt is pain limited and fairly weak. Patient's performance this date reveals decreased ability, independence, and tolerance in performing all basic mobility required for performance of activities of daily living. Pt requires additional DME, close physical assistance, and cues for safe participate in mobility. Pt will benefit from skilled PT intervention to increase independence and safety with basic mobility in preparation for discharge to the venue listed below.     No data found.        Recommendations for follow up therapy are one component of a multi-disciplinary discharge planning process, led by the attending physician.  Recommendations may be updated based on patient status, additional functional criteria and insurance authorization.  Follow Up  Recommendations Skilled nursing-short term rehab (<3 hours/day) Can patient physically be transported by private vehicle: No    Assistance Recommended at Discharge Intermittent Supervision/Assistance  Patient can return home with the following  Two people to help with walking and/or transfers;Assistance with cooking/housework;Help with stairs or ramp for entrance;Assist for transportation;Direct supervision/assist for financial management;Direct supervision/assist for medications management    Equipment Recommendations  (YRW)  Recommendations for Other Services       Functional Status Assessment Patient has had a recent decline in their functional status and demonstrates the ability to make significant improvements in function in a reasonable and predictable amount of time.     Precautions / Restrictions Precautions Precautions: Fall Restrictions Weight Bearing Restrictions: Yes LLE Weight Bearing: Weight bearing as tolerated      Mobility  Bed Mobility Overal bed mobility: Needs Assistance Bed Mobility: Supine to Sit     Supine to sit: Min assist     General bed mobility comments: authro assits with draw sheet to EOB, minA for trunk righting    Transfers Overall transfer level: Needs assistance Equipment used: Rolling walker (2 wheels) Transfers: Sit to/from Stand, Bed to chair/wheelchair/BSC Sit to Stand: Min assist Stand pivot transfers: Min assist (laborious, lengthy, and labored)         General transfer comment: YRW, minA of pelvis with Rt knee block, cues for hand/foot/trunk placement, pt nervous    Ambulation/Gait Ambulation/Gait assistance:  (unable)                Stairs            Wheelchair Mobility    Modified Rankin (Stroke Patients Only)  Balance                                             Pertinent Vitals/Pain Pain Assessment Pain Assessment: Faces Faces Pain Scale: Hurts little more    Home  Living Family/patient expects to be discharged to:: Skilled nursing facility Living Arrangements: Alone (DTR lives in [place])                      Prior Function Prior Level of Function : Independent/Modified Independent             Mobility Comments: RW when out of house, no device in house       Hand Dominance        Extremity/Trunk Assessment                Communication      Cognition Arousal/Alertness: Awake/alert Behavior During Therapy: WFL for tasks assessed/performed Overall Cognitive Status: Within Functional Limits for tasks assessed                                          General Comments      Exercises     Assessment/Plan    PT Assessment Patient needs continued PT services  PT Problem List Decreased strength;Decreased range of motion;Decreased activity tolerance;Decreased balance;Decreased mobility;Decreased coordination;Decreased knowledge of use of DME;Decreased safety awareness;Decreased knowledge of precautions;Cardiopulmonary status limiting activity       PT Treatment Interventions DME instruction;Balance training;Gait training;Stair training;Functional mobility training;Therapeutic activities;Therapeutic exercise;Patient/family education    PT Goals (Current goals can be found in the Care Plan section)  Acute Rehab PT Goals Patient Stated Goal: pain control, avoid subsequent fall PT Goal Formulation: With patient Time For Goal Achievement: 02/12/22 Potential to Achieve Goals: Good    Frequency 7X/week     Co-evaluation               AM-PAC PT "6 Clicks" Mobility  Outcome Measure Help needed turning from your back to your side while in a flat bed without using bedrails?: A Lot Help needed moving from lying on your back to sitting on the side of a flat bed without using bedrails?: A Lot Help needed moving to and from a bed to a chair (including a wheelchair)?: A Lot Help needed standing up from a  chair using your arms (e.g., wheelchair or bedside chair)?: A Lot Help needed to walk in hospital room?: Total Help needed climbing 3-5 steps with a railing? : Total 6 Click Score: 10    End of Session Equipment Utilized During Treatment: Oxygen Activity Tolerance: Patient tolerated treatment well;Patient limited by pain Patient left: in chair;with call bell/phone within reach   PT Visit Diagnosis: Difficulty in walking, not elsewhere classified (R26.2);Unsteadiness on feet (R26.81)    Time: 7681-1572 PT Time Calculation (min) (ACUTE ONLY): 26 min   Charges:   PT Evaluation $PT Eval Moderate Complexity: 1 Mod PT Treatments $Gait Training: 8-22 mins       11:50 AM, 01/29/22 Etta Grandchild, PT, DPT Physical Therapist - [facility]  [phone] ([place])    Kennan Detter C 01/29/2022, 11:47 AM

## 2022-01-29 NOTE — Plan of Care (Signed)

## 2022-01-29 NOTE — Assessment & Plan Note (Signed)
--  supplements per dietician  

## 2022-01-29 NOTE — NC FL2 (Signed)
[place] LEVEL OF CARE SCREENING TOOL     IDENTIFICATION  Patient Name: Angela Mason Birthdate: 1932/12/28 Sex: female Admission Date (Current Location): 01/27/2022  [facility] and [place] Number:  Engineering geologist and Address:  [facility], [address], [place], [place] [address]      Provider Number: [id number]  Attending Physician Name and Address:  Enzo Bi, MD  Relative Name and Phone Number:  Magda Paganini (daughter) [phone]    Current Level of Care: Hospital Recommended Level of Care: [place] Prior Approval Number:    Date Approved/Denied:   PASRR Number: [id number]  Discharge Plan: SNF    Current Diagnoses: Patient Active Problem List   Diagnosis Date Noted   Protein-calorie malnutrition, severe 01/29/2022   Pubic ramus fracture, left, closed, initial encounter ([place]) 01/27/2022   Leukocytosis 01/27/2022   Accidental fall 01/27/2022   Hypoxia 01/27/2022   MDD (major depressive disorder), recurrent episode, moderate ([place]) 07/31/2021   Insomnia 07/31/2021   Elevated blood pressure reading in office with diagnosis of hypertension 03/06/2021   Major depressive disorder with single episode, in full remission ([place]) 03/05/2021   History of delirium 03/05/2021   High risk medication use 03/05/2021   Severe major depression, single episode, with psychotic features ([place]) 10/12/2020   AMS (altered mental status) 59/56/3875   Acute metabolic encephalopathy 64/33/2951   UTI (urinary tract infection) 10/04/2020   HLD (hyperlipidemia)    HTN (hypertension)    Hypothyroidism    Hyponatremia    Hypertensive urgency    Encephalopathy    Underweight 08/08/2020   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 08/04/2018   Cystocele, midline 05/05/2017   Nocturia 10/07/2016   Unstable bladder 10/07/2016   Vaginal atrophy 01/23/2016   Procidentia of uterus 11/01/2015   Midline cystocele 11/01/2015     Orientation RESPIRATION BLADDER Height & Weight     Self, Time, Situation, Place  O2 (2L nasal cannula) Continent, External catheter Weight: 86 lb 3.2 oz (39.1 kg) Height:  '4\' 10"'$  (147.3 cm)  BEHAVIORAL SYMPTOMS/MOOD NEUROLOGICAL BOWEL NUTRITION STATUS      Continent Diet (see discharge summary)  AMBULATORY STATUS COMMUNICATION OF NEEDS Skin   Limited Assist Verbally Other (Comment) (abrasion left hand)                       Personal Care Assistance Level of Assistance  Feeding, Bathing, Dressing, Total care Bathing Assistance: Limited assistance Feeding assistance: Independent Dressing Assistance: Limited assistance Total Care Assistance: Limited assistance   Functional Limitations Info  Sight, Hearing, Speech Sight Info: Adequate Hearing Info: Adequate Speech Info: Adequate    SPECIAL CARE FACTORS FREQUENCY  PT (By licensed PT), OT (By licensed OT)     PT Frequency: min 4x weekly OT Frequency: min 4x weekly            Contractures Contractures Info: Not present    Additional Factors Info  Code Status, Allergies Code Status Info: full Allergies Info: Gentamicin   Levofloxacin           Current Medications (01/29/2022):  This is the current hospital active medication list Current Facility-Administered Medications  Medication Dose Route Frequency Provider Last Rate Last Admin   albuterol (PROVENTIL) (2.5 MG/3ML) 0.083% nebulizer solution 2.5 mg  2.5 mg Nebulization Q4H PRN Athena Masse, MD       ARIPiprazole (ABILIFY) tablet 2 mg  2 mg Oral Q breakfast Athena Masse, MD   2  mg at 01/29/22 0727   atenolol (TENORMIN) tablet 25 mg  25 mg Oral Daily Athena Masse, MD   25 mg at 01/29/22 0935   bisacodyl (DULCOLAX) EC tablet 5 mg  5 mg Oral Daily PRN Athena Masse, MD       calcium-vitamin D (OSCAL WITH D) 500-5 MG-MCG per tablet 1 tablet  1 tablet Oral Daily Fritzi Mandes, MD   1 tablet at 01/29/22 0935   cephALEXin (KEFLEX) capsule 500 mg  500 mg Oral  Q8H Fritzi Mandes, MD   500 mg at 01/29/22 1430   enoxaparin (LOVENOX) injection 30 mg  30 mg Subcutaneous Q24H Judd Gaudier V, MD   30 mg at 01/28/22 2158   feeding supplement (ENSURE ENLIVE / ENSURE PLUS) liquid 237 mL  237 mL Oral BID BM Fritzi Mandes, MD   237 mL at 01/29/22 1430   HYDROcodone-acetaminophen (NORCO/VICODIN) 5-325 MG per tablet 1-2 tablet  1-2 tablet Oral Q6H PRN Fritzi Mandes, MD   1 tablet at 01/28/22 1819   mirtazapine (REMERON) tablet 15 mg  15 mg Oral QHS Athena Masse, MD   15 mg at 01/28/22 2159   multivitamin with minerals tablet 1 tablet  1 tablet Oral BID Fritzi Mandes, MD   1 tablet at 01/29/22 0935   omega-3 acid ethyl esters (LOVAZA) capsule 1 g  1 g Oral Daily Fritzi Mandes, MD   1 g at 01/29/22 0935   ondansetron (ZOFRAN) tablet 4 mg  4 mg Oral Q6H PRN Athena Masse, MD       Or   ondansetron [facility]) injection 4 mg  4 mg Intravenous Q6H PRN Athena Masse, MD       polyvinyl alcohol (LIQUIFILM TEARS) 1.4 % ophthalmic solution 1 drop  1 drop Both Eyes QHS Fritzi Mandes, MD   1 drop at 01/28/22 2202   pravastatin (PRAVACHOL) tablet 20 mg  20 mg Oral q1800 Athena Masse, MD   20 mg at 01/28/22 1819   psyllium (HYDROCIL/METAMUCIL) 1 packet  1 packet Oral BID Enzo Bi, MD       traMADol Veatrice Bourbon) tablet 50 mg  50 mg Oral Q6H PRN Fritzi Mandes, MD   50 mg at 01/28/22 2159     Discharge Medications: Please see discharge summary for a list of discharge medications.  Relevant Imaging Results:  Relevant Lab Results:   Additional Information SSN:[id number]  Alberteen Sam, LCSW

## 2022-01-29 NOTE — TOC Initial Note (Signed)
Transition of Care [facility]) - Initial/Assessment Note    Patient Details  Name: Angela Mason MRN: [id number] Date of Birth: [birth year]  Transition of [facility]) CM/SW Contact:    Alberteen Sam, LCSW Phone Number: 01/29/2022, 3:56 PM  Clinical Narrative:                  Patient from [place], per PT recs are SNF patient agreeable to go to [facility], Seth Bake informed of snf need.   Pending medical readiness to dc to [facility].   Expected Discharge Plan: Skilled Nursing Facility Barriers to Discharge: Continued Medical Work up   Patient Goals and CMS Choice Patient states their goals for this hospitalization and ongoing recovery are:: to go home CMS Medicare.gov Compare Post Acute Care list provided to:: Patient Choice offered to / list presented to : Patient  Expected Discharge Plan and Services Expected Discharge Plan: [place]       Living arrangements for the past 2 months: [facility])                                      Prior Living Arrangements/Services Living arrangements for the past 2 months: [place]) Lives with:: Facility Resident                   Activities of Daily Living Home Assistive Devices/Equipment: Environmental consultant (specify type) ADL Screening (condition at time of admission) Patient's cognitive ability adequate to safely complete daily activities?: Yes Is the patient deaf or have difficulty hearing?: No Does the patient have difficulty seeing, even when wearing glasses/contacts?: No Does the patient have difficulty concentrating, remembering, or making decisions?: No Patient able to express need for assistance with ADLs?: Yes Does the patient have difficulty dressing or bathing?: No Independently performs ADLs?: Yes (appropriate for developmental age) Does the patient have difficulty walking or climbing stairs?: No Weakness of Legs: None Weakness of  Arms/Hands: None  Permission Sought/Granted                  Emotional Assessment       Orientation: : Oriented to Self, Oriented to Place, Oriented to  Time, Oriented to Situation Alcohol / Substance Use: Not Applicable Psych Involvement: No (comment)  Admission diagnosis:  Pubic ramus fracture, left, closed, initial encounter ([place]) [S32.592A] Closed fracture of ramus of left pubis, initial encounter ([place]) [S32.592A] Patient Active Problem List   Diagnosis Date Noted   Protein-calorie malnutrition, severe 01/29/2022   Pubic ramus fracture, left, closed, initial encounter ([place]) 01/27/2022   Leukocytosis 01/27/2022   Accidental fall 01/27/2022   Hypoxia 01/27/2022   MDD (major depressive disorder), recurrent episode, moderate ([place]) 07/31/2021   Insomnia 07/31/2021   Elevated blood pressure reading in office with diagnosis of hypertension 03/06/2021   Major depressive disorder with single episode, in full remission ([place]) 03/05/2021   History of delirium 03/05/2021   High risk medication use 03/05/2021   Severe major depression, single episode, with psychotic features ([place]) 10/12/2020   AMS (altered mental status) 84/69/6295   Acute metabolic encephalopathy 28/41/3244   UTI (urinary tract infection) 10/04/2020   HLD (hyperlipidemia)    HTN (hypertension)    Hypothyroidism    Hyponatremia    Hypertensive urgency    Encephalopathy    Underweight 08/08/2020   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  08/04/2018   Cystocele, midline 05/05/2017   Nocturia 10/07/2016   Unstable bladder 10/07/2016   Vaginal atrophy 01/23/2016   Procidentia of uterus 11/01/2015   Midline cystocele 11/01/2015   PCP:  Albina Billet, MD Pharmacy:   [facility] [id number] - [address], [place] [address] Phone: [phone] Fax: [phone]     Social Determinants of Health (SDOH) Interventions    Readmission Risk  Interventions     No data to display

## 2022-01-29 NOTE — Progress Notes (Signed)
Pt had not peed all night, bladder scan shows 414 at most, bladder not distended and not hurting per pt. Pt states that she just woke up and doesn't have urge to pee as of yet and asleep all night and not been drinking fluids.  NP made aware.

## 2022-01-29 NOTE — Progress Notes (Signed)
  Progress Note   Patient: Angela Mason ERX:[id number] DOB: [birth year] DOA: 01/27/2022     2 DOS: the patient was seen and examined on 01/29/2022   Brief hospital course: Angela Mason is a 88 y.o. female with medical history significant for HLD, hypothyroidism and hypertension who presents to the ED for evaluation of left hip pain following a fall.  Patient describes losing her balance and falling after turning and is now unable to bear weight on her left leg.   Assessment and Plan: * Pubic ramus fracture, left, closed, initial encounter ([place]) Displaced fracture of the mid left inferior pubic ramus. Nondisplaced fracture at the left pubic root. Fracture of the left sacral ala. --Nonoperative/conservative management per Ortho consult  --weightbearing as tolerated on the left leg  --follow-up in the ortho office in 4 to 6 weeks  --SNF rehab   Accidental fall --Fall appears to be accidental PT evaluation   Hypoxia Suspect COPD/emphysema with mild exacerbation -- Ex smoker  Over 15 years ago. --O2 sat 88% in the ED improving to 95% with 2 L. Wean to RA --incentive spirometer tachypneic with mild wheezing on exam --Chest x-ray showing emphysema --Continue supplemental oxygen to keep sats over 90% --DuoNebs as needed  Hypertensive urgency --Likely related in part to pain --Continue atenolol with as needed antihypertensives to achieve goal SBP under 140   Leukocytosis --Suspect secondary to fall.   --Chest x-ray clear.  --started on empiric keflex for UTI, though pt asymptomatic --d/c Keflex     Protein-calorie malnutrition, severe --supplements per dietician  Hypothyroidism --Continue levothyroxine  HLD (hyperlipidemia) --Continue lovastatin        Subjective:  Pt reported no pain if not moving.  No dyspnea.   Physical Exam: Vitals:   01/28/22 2020 01/29/22 0436 01/29/22 0737 01/29/22 1615  BP: 135/68 130/65 (!) 116/57 (!) 144/67  Pulse: 68 63 67 89   Resp: '16 18 16 16  '$ Temp: 98 F (36.7 C) 97.8 F (36.6 C) 97.8 F (36.6 C) 98.1 F (36.7 C)  TempSrc:      SpO2: 96% 100%  97%  Weight:      Height:        Constitutional: NAD, AAOx3, sitting in recliner HEENT: conjunctivae and lids normal, EOMI CV: No cyanosis.   RESP: normal respiratory effort SKIN: warm, dry Neuro: II - XII grossly intact.   Psych: Normal mood and affect.  Appropriate judgement and reason   DVT prophylaxis: Lovenox SQ Code Status: Full code  Family Communication:  Status is: inpatient Dispo:   The patient is from: home Anticipated d/c is to: SNF rehab Anticipated d/c date is: whenever bed available   Time spent: 50 minutes  Author: Enzo Bi, MD 01/29/2022 7:30 PM  For on call review [email or web address].

## 2022-01-29 NOTE — Progress Notes (Signed)
[name], female   DOB: 11-07-1932, 88 y.o.   MRN: [id number]  Subjective: Patient still notes mild-moderate left hip pain, but is able to tolerate some limited weightbearing on the left leg with physical therapy utilizing a walker for balance and support.  She is sitting in the chair at this time.  She has no new complaints pertaining to her left hip/pelvic region.   Objective: Vital signs in last 24 hours: Temp:  [97.8 F (36.6 C)-98 F (36.7 C)] 97.8 F (36.6 C) (07/05 0737) Pulse Rate:  [63-75] 67 (07/05 0737) Resp:  [15-18] 16 (07/05 0737) BP: (116-179)/(57-82) 116/57 (07/05 0737) SpO2:  [96 %-100 %] 100 % (07/05 0436)  Intake/Output from previous day: 07/04 0701 - 07/05 0700 In: 840 [P.O.:840] Out: 950 [Urine:950] Intake/Output this shift: No intake/output data recorded.  Recent Labs    01/27/22 1732 01/29/22 0534  HGB 12.9 10.8*   Recent Labs    01/27/22 1732 01/29/22 0534  WBC 20.5* 12.4*  RBC 4.04 3.38*  HCT 39.7 33.8*  PLT 217 164   Recent Labs    01/27/22 1732  NA 134*  K 4.2  CL 97*  CO2 31  BUN 31*  CREATININE 0.81  GLUCOSE 106*  CALCIUM 9.0   No results for input(s): "LABPT", "INR" in the last 72 hours.  Physical Exam: Orthopedic examination is unchanged as compared to the initial consultation note.  Skin inspection around the left hip and thigh is unremarkable.  No swelling, erythema, ecchymosis, abrasions, or other skin abnormalities are identified.  She is able to tolerate gentle logrolling of the hip without discomfort, but still has pain when she attempts to do an active straight leg raise.  She remains neurovascularly intact to the left lower extremity and foot.  Assessment: Mildly displaced left superior and inferior pubic rami fractures.  Plan: The patient may continue to be mobilized with physical therapy, weightbearing as tolerated on the left leg and using a walker for balance and support.  Most likely, she will  require rehab placement for a few weeks.  Thank you for allowing me to participate in the care of this most delightful woman.  I will sign off at this time.  Please make arrangements for her to follow-up in the office in 4 to 6 weeks with either Cameron Proud, PA-C, or myself for repeat x-rays of her pelvis and left hip.  If you have further need of orthopedic input during this hospitalization, please reconsult me.   Angela Mason 01/29/2022, 12:01 PM

## 2022-01-29 NOTE — Evaluation (Signed)
Occupational Therapy Evaluation Patient Details Name: Angela Mason MRN: [id number] DOB: 13-Jul-1933 Today's Date: 01/29/2022   History of Present Illness Angela Mason is an 59yoF who comes to [facility] on 01/29/22 after a fall at home at [facility] [place]. Imaging revealing of Left inferior pubic ramus fracture, left pubic root fracture, left sacral ala fracture. PMH: HLD, HTN, hypoTSH, COPD. Orthopedics recommending conservative management of these fractures. Pt is recently widowed, typically uses a RW outside of home, no device in home. Per Dr. Roland Rack the patient can be WBAT.   Clinical Impression   Patient presenting with decreased Ind in self care,balance, functional mobility/transfers, endurance, and safety awareness. Patient reports being Mod I at baseline with use of RW in community only. Pt reports performing all ADLs and IADLs without assistance. She lives at [facility] in independent living. She does appear to have some mild confusion and needs redirection to task during session.  Patient currently functioning at min A overall with use of RW Patient will benefit from acute OT to increase overall independence in the areas of ADLs, functional mobility, and safety awareness in order to safely discharge to next venue of care.      Recommendations for follow up therapy are one component of a multi-disciplinary discharge planning process, led by the attending physician.  Recommendations may be updated based on patient status, additional functional criteria and insurance authorization.   Follow Up Recommendations  Skilled nursing-short term rehab (<3 hours/day)    Assistance Recommended at Discharge Intermittent Supervision/Assistance  Patient can return home with the following A little help with walking and/or transfers;A little help with bathing/dressing/bathroom;Assistance with cooking/housework;Assist for transportation;Help with stairs or ramp for entrance    Functional Status  Assessment  Patient has had a recent decline in their functional status and demonstrates the ability to make significant improvements in function in a reasonable and predictable amount of time.  Equipment Recommendations  Other (comment) (defer to next venue of care)       Precautions / Restrictions Precautions Precautions: Fall Restrictions Weight Bearing Restrictions: Yes LLE Weight Bearing: Weight bearing as tolerated      Mobility Bed Mobility               General bed mobility comments: seated in recliner chair at beginning/end of session    Transfers Overall transfer level: Needs assistance Equipment used: Rolling walker (2 wheels) Transfers: Sit to/from Stand, Bed to chair/wheelchair/BSC Sit to Stand: Min assist Stand pivot transfers: Min assist                Balance Overall balance assessment: Needs assistance Sitting-balance support: Feet supported Sitting balance-Leahy Scale: Good     Standing balance support: During functional activity, Reliant on assistive device for balance Standing balance-Leahy Scale: Fair                             ADL either performed or assessed with clinical judgement   ADL Overall ADL's : Needs assistance/impaired     Grooming: Wash/dry hands;Wash/dry face;Sitting;Supervision/safety;Set up               Lower Body Dressing: Minimal assistance;Sit to/from stand   Toilet Transfer: Minimal assistance;Rolling walker (2 wheels) Toilet Transfer Details (indicate cue type and reason): simulated         Functional mobility during ADLs: Minimal assistance;Rolling walker (2 wheels)       Vision Patient Visual Report: No change from baseline  Pertinent Vitals/Pain Pain Assessment Pain Assessment: 0-10 Faces Pain Scale: Hurts little more Pain Location: L hip Pain Descriptors / Indicators: Aching, Discomfort, Guarding Pain Intervention(s): Limited activity within patient's tolerance,  Premedicated before session, Repositioned     Hand Dominance Right   Extremity/Trunk Assessment Upper Extremity Assessment Upper Extremity Assessment: Generalized weakness;Overall [facility] for tasks assessed   Lower Extremity Assessment Lower Extremity Assessment: Generalized weakness;Overall WFL for tasks assessed       Communication Communication Communication: No difficulties   Cognition Arousal/Alertness: Awake/alert Behavior During Therapy: WFL for tasks assessed/performed Overall Cognitive Status: Within Functional Limits for tasks assessed                                                  Home Living Family/patient expects to be discharged to:: Skilled nursing facility Living Arrangements: Alone                                      Prior Functioning/Environment Prior Level of Function : Independent/Modified Independent             Mobility Comments: RW when out of house, no device in house ADLs Comments: Pt reports Ind in self care tasks and IADLs.        OT Problem List: Decreased activity tolerance;Impaired balance (sitting and/or standing);Decreased strength;Pain;Decreased safety awareness;Decreased knowledge of use of DME or AE      OT Treatment/Interventions: Self-care/ADL training;Balance training;Therapeutic exercise;Therapeutic activities;DME and/or AE instruction;Patient/family education    OT Goals(Current goals can be found in the care plan section) Acute Rehab OT Goals Patient Stated Goal: to go home OT Goal Formulation: With patient Time For Goal Achievement: 02/12/22 Potential to Achieve Goals: Fair ADL Goals Pt Will Perform Grooming: with supervision;standing Pt Will Perform Lower Body Dressing: with supervision;sit to/from stand Pt Will Transfer to Toilet: with supervision;ambulating Pt Will Perform Toileting - Clothing Manipulation and hygiene: with supervision;sit to/from stand  OT Frequency: Min 2X/week        AM-PAC OT "6 Clicks" Daily Activity     Outcome Measure Help from another person eating meals?: None Help from another person taking care of personal grooming?: A Little Help from another person toileting, which includes using toliet, bedpan, or urinal?: A Little Help from another person bathing (including washing, rinsing, drying)?: A Lot Help from another person to put on and taking off regular upper body clothing?: None Help from another person to put on and taking off regular lower body clothing?: A Lot 6 Click Score: 18   End of Session Equipment Utilized During Treatment: Rolling walker (2 wheels) Nurse Communication: Mobility status  Activity Tolerance: Patient tolerated treatment well;Patient limited by fatigue Patient left: in bed;with call bell/phone within reach;with bed alarm set  OT Visit Diagnosis: Unsteadiness on feet (R26.81);Muscle weakness (generalized) (M62.81)                Time: 0950-1004 OT Time Calculation (min): 14 min Charges:  OT General Charges $OT Visit: 1 Visit OT Evaluation $OT Eval Moderate Complexity: 1 [address] [place], OTR/L , CBIS ascom [phone]  01/29/22, 12:27 PM

## 2022-01-30 DIAGNOSIS — S32592A Other specified fracture of left pubis, initial encounter for closed fracture: Secondary | ICD-10-CM | POA: Diagnosis not present

## 2022-01-30 NOTE — Progress Notes (Signed)
  Progress Note   Patient: Angela Mason TKZ:[id number] DOB: 1933-05-31 DOA: 01/27/2022     3 DOS: the patient was seen and examined on 01/30/2022   Brief hospital course: Gianelle Mccaul is a 88 y.o. female with medical history significant for HLD, hypothyroidism and hypertension who presents to the ED for evaluation of left hip pain following a fall.  Patient describes losing her balance and falling after turning and is now unable to bear weight on her left leg.   Assessment and Plan: * Pubic ramus fracture, left, closed, initial encounter ([place]) Displaced fracture of the mid left inferior pubic ramus. Nondisplaced fracture at the left pubic root. Fracture of the left sacral ala. --Nonoperative/conservative management per Ortho consult  --weightbearing as tolerated on the left leg  --follow-up in the ortho office in 4 to 6 weeks  --SNF rehab   Accidental fall --Fall appears to be accidental PT evaluation   Hypoxia Suspect COPD/emphysema with mild exacerbation -- Ex smoker  Over 15 years ago. --O2 sat 88% in the ED improving to 95% with 2 L. Weaned to RA --Chest x-ray showing emphysema --DuoNebs as needed  Hypertensive urgency --Likely related in part to pain --Continue atenolol with as needed antihypertensives to achieve goal SBP under 140   Leukocytosis --Suspect secondary to fall.   --Chest x-ray clear.  --started on empiric keflex for UTI, though pt asymptomatic, abx not continued     Protein-calorie malnutrition, severe --supplements per dietician  Hypothyroidism --Continue levothyroxine  HLD (hyperlipidemia) --Continue lovastatin        Subjective:  Per nursing, pt repeatedly asked to go sit on the bedside commode, but not producing.   Physical Exam: Vitals:   01/30/22 0807 01/30/22 1137 01/30/22 1225 01/30/22 1643  BP: (!) 122/54  (!) 152/71 (!) 146/62  Pulse: 69  90 65  Resp: '16  16 16  '$ Temp: 97.7 F (36.5 C)  97.7 F (36.5 C) 97.7 F (36.5  C)  TempSrc:      SpO2: 97% 90% 95% 98%  Weight:      Height:        Constitutional: NAD, alert, sitting in recliner  HEENT: conjunctivae and lids normal, EOMI CV: No cyanosis.   RESP: normal respiratory effort, on RA Neuro: II - XII grossly intact.     DVT prophylaxis: Lovenox SQ Code Status: Full code  Family Communication:  Status is: inpatient Dispo:   The patient is from: home Anticipated d/c is to: SNF rehab Anticipated d/c date is: whenever bed available   Time spent: 25 minutes  Author: Enzo Bi, MD 01/30/2022 7:06 PM  For on call review [email or web address].

## 2022-01-30 NOTE — Progress Notes (Signed)
Physical Therapy Treatment Patient Details Name: Angela Mason MRN: [id number] DOB: [birth year] Today's Date: 01/30/2022   History of Present Illness Angela Mason is an 9yoF who comes to [facility] on 01/29/22 after a fall at home at [facility]. Imaging revealing of Left inferior pubic ramus fracture, left pubic root fracture, left sacral ala fracture. PMH: HLD, HTN, hypoTSH, COPD. Orthopedics recommending conservative management of these fractures. Pt is recently widowed, typically uses a RW outside of home, no device in home. Per Dr. Roland Rack the patient can be WBAT.    PT Comments    Pt in recliner on entry, agreeable to session- visitor/friend present in entirety. Pt assisted with AA/ROM in recliner, only needs considerable assistance to perform LAQ on Left. Pt able to perform 3-4x STS from recliner, cues for hand placement, good use of YRW, but left pelvis discomfort in standing does not permit more than partial weightbearing on LLE. Pt left up in recliner, all needs met. Pt on room air at 90% SpO2, attempting ween, RN aware.   Recommendations for follow up therapy are one component of a multi-disciplinary discharge planning process, led by the attending physician.  Recommendations may be updated based on patient status, additional functional criteria and insurance authorization.  Follow Up Recommendations  Skilled nursing-short term rehab (<3 hours/day) Can patient physically be transported by private vehicle: No   Assistance Recommended at Discharge Intermittent Supervision/Assistance  Patient can return home with the following Two people to help with walking and/or transfers;Assistance with cooking/housework;Help with stairs or ramp for entrance;Assist for transportation;Direct supervision/assist for financial management;Direct supervision/assist for medications management   Equipment Recommendations   (youth RW)    Recommendations for Other Services       Precautions /  Restrictions Precautions Precautions: Fall Restrictions Weight Bearing Restrictions: Yes LLE Weight Bearing: Weight bearing as tolerated     Mobility  Bed Mobility               General bed mobility comments: seated in recliner chair at beginning/end of session    Transfers Overall transfer level: Needs assistance Equipment used: Rolling walker (2 wheels) Transfers: Sit to/from Stand Sit to Stand: Min guard           General transfer comment: YRW, cues for hands, pt performs 3x30sec focusing on comfort and balance    Ambulation/Gait                   Stairs             Wheelchair Mobility    Modified Rankin (Stroke Patients Only)       Balance                                            Cognition Arousal/Alertness: Awake/alert Behavior During Therapy: WFL for tasks assessed/performed Overall Cognitive Status: Within Functional Limits for tasks assessed                                          Exercises General Exercises - Lower Extremity Ankle Circles/Pumps: Strengthening, Both, 15 reps, Seated Long Arc Quad: AROM, AAROM, Both, 15 reps, Seated Heel Slides: AAROM, Seated, Both, 15 reps Hip ABduction/ADduction: AAROM, Seated, Both, 15 reps Other Exercises Other Exercises: STS from recliner 3x60sec c YRW  General Comments        Pertinent Vitals/Pain Pain Assessment Pain Assessment: Faces Faces Pain Scale: Hurts little more (mostly when in standing)    Home Living                          Prior Function            PT Goals (current goals can now be found in the care plan section) Acute Rehab PT Goals Patient Stated Goal: pain control, avoid subsequent fall PT Goal Formulation: With patient Time For Goal Achievement: 02/12/22 Potential to Achieve Goals: Good Progress towards PT goals: Progressing toward goals    Frequency    7X/week      PT Plan Current plan remains  appropriate    Co-evaluation              AM-PAC PT "6 Clicks" Mobility   Outcome Measure  Help needed turning from your back to your side while in a flat bed without using bedrails?: A Lot Help needed moving from lying on your back to sitting on the side of a flat bed without using bedrails?: A Lot Help needed moving to and from a bed to a chair (including a wheelchair)?: A Lot Help needed standing up from a chair using your arms (e.g., wheelchair or bedside chair)?: A Lot Help needed to walk in hospital room?: A Lot Help needed climbing 3-5 steps with a railing? : Total 6 Click Score: 11    End of Session Equipment Utilized During Treatment: Oxygen Activity Tolerance: Patient tolerated treatment well;Patient limited by pain Patient left: in chair;with call bell/phone within reach Nurse Communication: Mobility status PT Visit Diagnosis: Difficulty in walking, not elsewhere classified (R26.2);Unsteadiness on feet (R26.81)     Time: 3151-7616 PT Time Calculation (min) (ACUTE ONLY): 35 min  Charges:  $Therapeutic Exercise: 23-37 mins                    11:42 AM, 01/30/22 Etta Grandchild, PT, DPT Physical Therapist - [facility]  [phone] ([place])     [place] C 01/30/2022, 11:40 AM

## 2022-01-30 NOTE — Care Management Important Message (Signed)
Important Message  Patient Details  Name: Angela Mason MRN: [id number] Date of Birth: [birth year]   Medicare Important Message Given:  N/A - LOS <3 / Initial given by admissions     Angela Mason 01/30/2022, 10:27 AM

## 2022-01-30 NOTE — Progress Notes (Signed)
Occupational Therapy Treatment Patient Details Name: Angela Mason MRN: [id number] DOB: [birth year] Today's Date: 01/30/2022   History of present illness Angela Mason is an 82yoF who comes to [facility] on 01/29/22 after a fall at home at [facility]. Imaging revealing of Left inferior pubic ramus fracture, left pubic root fracture, left sacral ala fracture. PMH: HLD, HTN, hypoTSH, COPD. Orthopedics recommending conservative management of these fractures. Pt is recently widowed, typically uses a RW outside of home, no device in home. Per Dr. Roland Rack the patient can be WBAT.   OT comments  Upon entering the room, pt is seated in recliner chair and agreeable to OT intervention. Pt reports she does not need to use bathroom but discussed toilet transfer for practice. Pt stands with cuing for hand placement and min A from recliner chair. Pt reporting increased pain and RN called for medication. Pt performs step pivot transfer to [facility] with pt unable to bear full weight thru L LE this session and needing cuing to weight shift and take larger steps. Pt seated on commode and voids and needs min A for thoroughness of hygiene. Once she is up and begins stepping towards bed she urinates on floor. OT assisted with hygiene once more before sitting on bed and increased assistance for hygiene for B LEs and to remove soiled socks. Sit >supine with min A for L LE to return to bed. All needs within reach including call bell. OT continues to recommend SNF to address functional deficits before returning home.    Recommendations for follow up therapy are one component of a multi-disciplinary discharge planning process, led by the attending physician.  Recommendations may be updated based on patient status, additional functional criteria and insurance authorization.    Follow Up Recommendations  Skilled nursing-short term rehab (<3 hours/day)    Assistance Recommended at Discharge Intermittent Supervision/Assistance   Patient can return home with the following  A little help with walking and/or transfers;A little help with bathing/dressing/bathroom;Assistance with cooking/housework;Assist for transportation;Help with stairs or ramp for entrance   Equipment Recommendations  Other (comment) (defer to next venue of care)       Precautions / Restrictions Precautions Precautions: Fall Restrictions Weight Bearing Restrictions: Yes LLE Weight Bearing: Weight bearing as tolerated       Mobility Bed Mobility Overal bed mobility: Needs Assistance Bed Mobility: Sit to Supine       Sit to supine: Min assist   General bed mobility comments: assist for L LE    Transfers Overall transfer level: Needs assistance Equipment used: Rolling walker (2 wheels) Transfers: Sit to/from Stand, Bed to chair/wheelchair/BSC Sit to Stand: Min assist     Step pivot transfers: Min assist           Balance Overall balance assessment: Needs assistance Sitting-balance support: Feet supported Sitting balance-Leahy Scale: Good     Standing balance support: During functional activity, Reliant on assistive device for balance Standing balance-Leahy Scale: Fair                             ADL either performed or assessed with clinical judgement   ADL Overall ADL's : Needs assistance/impaired                         Toilet Transfer: Minimal assistance;Rolling walker (2 wheels);Ambulation;BSC/3in1   Toileting- Clothing Manipulation and Hygiene: Minimal assistance;Sit to/from stand       Functional mobility  during ADLs: Minimal assistance;Rolling walker (2 wheels)      Extremity/Trunk Assessment Upper Extremity Assessment Upper Extremity Assessment: Generalized weakness   Lower Extremity Assessment Lower Extremity Assessment: Generalized weakness        Vision Patient Visual Report: No change from baseline            Cognition Arousal/Alertness: Awake/alert Behavior  During Therapy: WFL for tasks assessed/performed Overall Cognitive Status: Within Functional Limits for tasks assessed                                                     Pertinent Vitals/ Pain       Pain Assessment Pain Assessment: Faces Faces Pain Scale: Hurts even more Pain Location: L hip Pain Descriptors / Indicators: Aching, Discomfort, Guarding Pain Intervention(s): Monitored during session, Repositioned, Patient requesting pain meds-RN notified         Frequency  Min 2X/week        Progress Toward Goals  OT Goals(current goals can now be found in the care plan section)  Progress towards OT goals: Progressing toward goals  Acute Rehab OT Goals Patient Stated Goal: to go home OT Goal Formulation: With patient Time For Goal Achievement: 02/12/22 Potential to Achieve Goals: [place] Discharge plan remains appropriate;Frequency remains appropriate       AM-PAC OT "6 Clicks" Daily Activity     Outcome Measure   Help from another person eating meals?: None Help from another person taking care of personal grooming?: A Little Help from another person toileting, which includes using toliet, bedpan, or urinal?: A Little Help from another person bathing (including washing, rinsing, drying)?: A Little Help from another person to put on and taking off regular upper body clothing?: A Little Help from another person to put on and taking off regular lower body clothing?: A Lot 6 Click Score: 18    End of Session Equipment Utilized During Treatment: Rolling walker (2 wheels)  OT Visit Diagnosis: Unsteadiness on feet (R26.81);Muscle weakness (generalized) (M62.81)   Activity Tolerance Patient tolerated treatment well;Patient limited by fatigue   Patient Left in bed;with call bell/phone within reach;with bed alarm set   Nurse Communication Mobility status;Patient requests pain meds        Time: 1424-1450 OT Time Calculation (min): 26  min  Charges: OT General Charges $OT Visit: 1 Visit OT Treatments $Self Care/Home Management : 23-37 mins  Darleen Crocker, MS, OTR/L , CBIS ascom [phone]  01/30/22, 3:00 PM

## 2022-01-30 NOTE — Plan of Care (Signed)
  Problem: Health Behavior/Discharge Planning: Goal: Ability to manage health-related needs will improve Outcome: Progressing   Problem: Nutrition: Goal: Adequate nutrition will be maintained Outcome: Progressing   Problem: Coping: Goal: Level of anxiety will decrease Outcome: Progressing   Problem: Pain Managment: Goal: General experience of comfort will improve Outcome: Progressing   Problem: Safety: Goal: Ability to remain free from injury will improve Outcome: Progressing   

## 2022-01-31 DIAGNOSIS — S32592A Other specified fracture of left pubis, initial encounter for closed fracture: Secondary | ICD-10-CM | POA: Diagnosis not present

## 2022-01-31 MED ORDER — IPRATROPIUM-ALBUTEROL 0.5-2.5 (3) MG/3ML IN SOLN: 3.0000 mL | Freq: Three times a day (TID) | RESPIRATORY_TRACT | Status: DC

## 2022-01-31 MED ORDER — IPRATROPIUM-ALBUTEROL 0.5-2.5 (3) MG/3ML IN SOLN
3.0000 mL | Freq: Three times a day (TID) | RESPIRATORY_TRACT | Status: DC
Start: 2022-01-31 — End: 2022-02-01
  Administered 2022-01-31 – 2022-02-01 (×3): 3 mL via RESPIRATORY_TRACT
  Filled 2022-01-31 (×3): qty 3

## 2022-01-31 MED ORDER — ACETAMINOPHEN 500 MG PO TABS: 1000.0000 mg | ORAL_TABLET | Freq: Three times a day (TID) | ORAL | Status: DC | PRN

## 2022-01-31 NOTE — Progress Notes (Signed)
Physical Therapy Treatment Patient Details Name: Angela Mason MRN: [id number] DOB: Jun 17, 1933 Today's Date: 01/31/2022   History of Present Illness Angela Mason is an 26yoF who comes to [facility] on 01/29/22 after a fall at home at [facility]. Imaging revealing of Left inferior pubic ramus fracture, left pubic root fracture, left sacral ala fracture. PMH: HLD, HTN, hypoTSH, COPD. Orthopedics recommending conservative management of these fractures. Pt is recently widowed, typically uses a RW outside of home, no device in home. Per Dr. Roland Rack the patient can be WBAT.    PT Comments    Pt in chair on arrival, finishing up with bathing with NAx2/ Pt is awake, agreeable to session, reports pain to be around 7/10, but appears comfortable and well controlled throughout. Pt denies has not used any analgesics past two days. Pt provided min-modA for LLE for marching and LAQ, pt showing improved tolerance and  independence with these compared to 2 prior days. Pt also showing improvements in STS tolerance and independence. Pt able to progress to AMB training this date, but at this level of activity, insufficient pain control is a hurdle- discussed with NSG, attended to coordinate a tylenol order for future AMB training. Pt left up in chair, all needs met.     Recommendations for follow up therapy are one component of a multi-disciplinary discharge planning process, led by the attending physician.  Recommendations may be updated based on patient status, additional functional criteria and insurance authorization.  Follow Up Recommendations  Skilled nursing-short term rehab (<3 hours/day) Can patient physically be transported by private vehicle: No   Assistance Recommended at Discharge Intermittent Supervision/Assistance  Patient can return home with the following Two people to help with walking and/or transfers;Assistance with cooking/housework;Help with stairs or ramp for entrance;Assist for  transportation;Direct supervision/assist for financial management;Direct supervision/assist for medications management   Equipment Recommendations   (YRW)    Recommendations for Other Services       Precautions / Restrictions Precautions Precautions: Fall Restrictions LLE Weight Bearing: Weight bearing as tolerated     Mobility  Bed Mobility               General bed mobility comments: in recliner on entry    Transfers Overall transfer level: Needs assistance Equipment used: Rolling walker (2 wheels) Transfers: Sit to/from Stand Sit to Stand: Min guard           General transfer comment: YRW, cues for hands, pt performs 3x30sec focusing on comfort and balance; moving muchfaster and less effort compared to previous day.    Ambulation/Gait Ambulation/Gait assistance: Min guard, Supervision Gait Distance (Feet): 5 Feet Assistive device: Rolling walker (2 wheels) Gait Pattern/deviations: Step-to pattern Gait velocity: pain is clearly a limiter, discussed with RNgetting an order for tylenol, pt not been taking anything for pain prior 2 days, but has orders for much higher tier analgesics.     General Gait Details: YRW; difficulty with typical RW positioning (frequently tries to step beyond wheel axis line; pain creating abberant step patterns, needs extensive cues to AMB successfully.   Stairs             Wheelchair Mobility    Modified Rankin (Stroke Patients Only)       Balance  Cognition Arousal/Alertness: Awake/alert Behavior During Therapy: WFL for tasks assessed/performed Overall Cognitive Status: Within Functional Limits for tasks assessed                                          Exercises General Exercises - Lower Extremity Long Arc Quad: AROM, AAROM, Both, 15 reps, Seated, Supine Hip Flexion/Marching: AROM, AAROM, Both, 15 reps, Supine, Seated,  Limitations Hip Flexion/Marching Limitations: requires modA-minA for LLE (no grimacing) Other Exercises Other Exercises: STS from recliner 1x3    General Comments        Pertinent Vitals/Pain Pain Assessment Pain Assessment: Faces Faces Pain Scale: Hurts even more Pain Location: L hip while standing walking    Home Living                          Prior Function            PT Goals (current goals can now be found in the care plan section) Acute Rehab PT Goals Patient Stated Goal: pain control, avoid subsequent fall PT Goal Formulation: With patient Time For Goal Achievement: 02/12/22 Potential to Achieve Goals: Good Progress towards PT goals: Progressing toward goals    Frequency    7X/week      PT Plan Current plan remains appropriate    Co-evaluation              AM-PAC PT "6 Clicks" Mobility   Outcome Measure  Help needed turning from your back to your side while in a flat bed without using bedrails?: A Lot Help needed moving from lying on your back to sitting on the side of a flat bed without using bedrails?: A Lot Help needed moving to and from a bed to a chair (including a wheelchair)?: A Little Help needed standing up from a chair using your arms (e.g., wheelchair or bedside chair)?: A Lot Help needed to walk in hospital room?: A Lot Help needed climbing 3-5 steps with a railing? : Total 6 Click Score: 12    End of Session Equipment Utilized During Treatment: Oxygen;Gait belt Activity Tolerance: Patient tolerated treatment well;Patient limited by pain Patient left: in chair;with call bell/phone within reach Nurse Communication: Mobility status;Patient requests pain meds PT Visit Diagnosis: Difficulty in walking, not elsewhere classified (R26.2);Unsteadiness on feet (R26.81)     Time: 1010-1041 PT Time Calculation (min) (ACUTE ONLY): 31 min  Charges:  $Gait Training: 8-22 mins $Therapeutic Exercise: 8-22 mins                     1:33 PM, 01/31/22 Etta Grandchild, PT, DPT Physical Therapist - [facility]  [phone] ([place])    [place] [facility] 01/31/2022, 1:30 PM

## 2022-01-31 NOTE — Care Management Important Message (Signed)
Important Message  Patient Details  Name: Angela Mason MRN: [id number] Date of Birth: October 09, 1932   Medicare Important Message Given:  Yes     Juliann Pulse A Zyniah Ferraiolo 01/31/2022, 3:35 PM

## 2022-01-31 NOTE — Plan of Care (Signed)

## 2022-01-31 NOTE — Progress Notes (Signed)
  Progress Note   Patient: Angela Mason [id number]:[id number] DOB: [birth year] DOA: 01/27/2022     4 DOS: the patient was seen and examined on 01/31/2022   Brief hospital course: Angela Mason is a 88 y.o. female with medical history significant for HLD, hypothyroidism and hypertension who presents to the ED for evaluation of left hip pain following a fall.  Patient describes losing her balance and falling after turning and is now unable to bear weight on her left leg.   Assessment and Plan: * Pubic ramus fracture, left, closed, initial encounter ([place]) Displaced fracture of the mid left inferior pubic ramus. Nondisplaced fracture at the left pubic root. Fracture of the left sacral ala. --Nonoperative/conservative management per Ortho consult  --weightbearing as tolerated on the left leg  --follow-up in the ortho office in 4 to 6 weeks  --SNF rehab   Accidental fall --Fall appears to be accidental PT evaluation   COPD (chronic obstructive pulmonary disease) ([place]) -- Ex smoker  Over 15 years ago. --O2 sat 88% in the ED improving to 95% with 2 L. Weaned to RA --Chest x-ray showing emphysema, but no acute finding.  No wheezes, dyspnea, cough to suggest exacerbation.  Pt likely has chronic hypoxemic respiratory failure Plan: --Continue supplemental O2 to keep sats between 88-92%, wean as tolerated --DuoNebs TID  Hypertensive urgency --Likely related in part to pain --Continue atenolol with as needed antihypertensives to achieve goal SBP under 140   Leukocytosis --Suspect secondary to fall.   --Chest x-ray clear.  --started on empiric keflex for UTI, though pt asymptomatic, abx not continued     Protein-calorie malnutrition, severe --supplements per dietician  Hypothyroidism --Continue levothyroxine  HLD (hyperlipidemia) --Continue lovastatin        Subjective:  Pt denied dyspnea, however, O2 sat dropped to 87-88% in room air.     Physical Exam: Vitals:   01/31/22  1524 01/31/22 1739 01/31/22 1949 01/31/22 1956  BP:  (!) 142/94 (!) 115/51   Pulse:  79 75 74  Resp:  '16 20 14  '$ Temp:  98.1 F (36.7 C) 98.6 F (37 C)   TempSrc:      SpO2: 97% 99% 98% 96%  Weight:      Height:        Constitutional: NAD, AAOx3, sitting in recliner HEENT: conjunctivae and lids normal, EOMI CV: No cyanosis.   RESP: normal respiratory effort, no wheezes, reduced lung sounds, on 2L SKIN: warm, dry Neuro: II - XII grossly intact.     DVT prophylaxis: Lovenox SQ Code Status: Full code  Family Communication: friend updated at bedside today Status is: inpatient Dispo:   The patient is from: home Anticipated d/c is to: SNF rehab Anticipated d/c date is: whenever bed available   Time spent: 25 minutes  Author: Enzo Bi, MD 01/31/2022 8:15 PM  For on call review [email or web address].

## 2022-02-01 DIAGNOSIS — S32592A Other specified fracture of left pubis, initial encounter for closed fracture: Secondary | ICD-10-CM | POA: Diagnosis not present

## 2022-02-01 DIAGNOSIS — Z85828 Personal history of other malignant neoplasm of skin: Secondary | ICD-10-CM | POA: Insufficient documentation

## 2022-02-01 DIAGNOSIS — R2689 Other abnormalities of gait and mobility: Secondary | ICD-10-CM | POA: Insufficient documentation

## 2022-02-01 DIAGNOSIS — M6281 Muscle weakness (generalized): Secondary | ICD-10-CM | POA: Insufficient documentation

## 2022-02-01 DIAGNOSIS — Z9181 History of falling: Secondary | ICD-10-CM | POA: Insufficient documentation

## 2022-02-01 DIAGNOSIS — S32592D Other specified fracture of left pubis, subsequent encounter for fracture with routine healing: Secondary | ICD-10-CM | POA: Insufficient documentation

## 2022-02-01 DIAGNOSIS — Z741 Need for assistance with personal care: Secondary | ICD-10-CM | POA: Insufficient documentation

## 2022-02-01 HISTORY — DX: History of falling: Z91.81

## 2022-02-01 MED ORDER — ENSURE ENLIVE PO LIQD: 237.0000 mL | Freq: Two times a day (BID) | ORAL | 12 refills | Status: DC

## 2022-02-01 MED ORDER — IPRATROPIUM-ALBUTEROL 0.5-2.5 (3) MG/3ML IN SOLN: 3.0000 mL | Freq: Four times a day (QID) | RESPIRATORY_TRACT | Status: DC | PRN

## 2022-02-01 NOTE — Plan of Care (Signed)
  Problem: Education: Goal: Knowledge of General Education information will improve Description: Including pain rating scale, medication(s)/side effects and non-pharmacologic comfort measures 02/01/2022 1154 by Riley Churches, RN Outcome: Progress  Problem: Activity: Goal: Risk for activity intolerance will decrease 02/01/2022 1154 by Dorothyann Peng D, RN Outcome: Progressing  Problem: Nutrition: Goal: Adequate nutrition will be maintained 02/01/2022 1154 by Dorothyann Peng D, RN Outcome: Progressing  Problem: Coping: Goal: Level of anxiety will decrease 02/01/2022 1154 by Riley Churches, RN Outcome: Progressing  Problem: Pain Managment: Goal: General experience of comfort will improve 02/01/2022 1154 by Dorothyann Peng D, RN Outcome: Progressing  Problem: Skin Integrity: Goal: Risk for impaired skin integrity will decrease 02/01/2022 1154 by Riley Churches, RN Outcome: Progressing

## 2022-02-01 NOTE — Discharge Summary (Signed)
Physician Discharge Summary   Angela Mason  female DOB: February 16, 1933  YQM:[id number]  PCP: Albina Billet, MD  Admit date: 01/27/2022 Discharge date: 02/01/2022  Admitted From: home Disposition:  SNF CODE STATUS: Full code   Hospital Course:  For full details, please see H&P, progress notes, consult notes and ancillary notes.  Briefly,  Angela Mason is a 88 y.o. female with medical history significant for hypothyroidism and hypertension who presented to the ED for evaluation of left hip pain following a fall.  Patient describes losing her balance and falling after turning and is now unable to bear weight on her left leg.    * Pubic ramus fracture, left, closed, initial encounter ([place]) # Displaced fracture of the mid left inferior pubic ramus. # Nondisplaced fracture at the left pubic root. # Fracture of the left sacral ala. --Nonoperative/conservative management per Ortho consult with Dr. Roland Rack. --weightbearing as tolerated on the left leg  --follow-up in the ortho office in 4 to 6 weeks  --SNF rehab   Accidental fall --Fall appears to be accidental --PT/OT   COPD (chronic obstructive pulmonary disease) ([place]) --Ex smoker Over 15 years ago. --O2 sat 88% in the ED improving to 95% with 2 L. Weaned to RA --Chest x-ray showing emphysema, but no acute finding.  No wheezes, dyspnea, cough to suggest exacerbation.  Pt likely has chronic hypoxemic respiratory failure --DuoNebs PRN   Hypertensive urgency --BP up to 205/85 on presentation.  Likely related in part to pain.  Improved.   --Continue home atenolol     Leukocytosis --Suspect secondary to fall.   --Chest x-ray clear.  --started on empiric keflex for UTI, though pt asymptomatic, abx not continued.   Protein-calorie malnutrition, severe --feeding supplement BID   Hypothyroidism --Continue levothyroxine   HLD (hyperlipidemia) --Continue lovastatin   Unless noted above, medications under "STOP" list are ones pt  was not taking PTA.  Discharge Diagnoses:  Principal Problem:   Pubic ramus fracture, left, closed, initial encounter ([place]) Active Problems:   Accidental fall   Hypertensive urgency   COPD (chronic obstructive pulmonary disease) ([facility])   Leukocytosis   HLD (hyperlipidemia)   Hypothyroidism   Protein-calorie malnutrition, severe   30 Day Unplanned Readmission Risk Score    Flowsheet Row ED to Hosp-Admission (Current) from 01/27/2022 in [place] (1A)  30 Day Unplanned Readmission Risk Score (%) 13.5 Filed at 02/01/2022 0800       This score is the patient's risk of an unplanned readmission within 30 days of being discharged (0 -100%). The score is based on dignosis, age, lab data, medications, orders, and past utilization.   Low:  0-14.9   Medium: 15-21.9   High: 22-29.9   Extreme: 30 and above         Discharge Instructions:  Allergies as of 02/01/2022       Reactions   Gentamicin    Levofloxacin Diarrhea        Medication List     STOP taking these medications    melatonin 5 MG Tabs       TAKE these medications    ARIPiprazole 2 MG tablet Commonly known as: Abilify Take 1 tablet (2 mg total) by mouth daily with breakfast.   atenolol 25 MG tablet Commonly known as: TENORMIN Take 1 tablet (25 mg total) by mouth daily.   calcium-vitamin D 500-200 MG-UNIT tablet Commonly known as: OSCAL WITH D Take 1 tablet by mouth daily.   feeding supplement Liqd  Take 237 mLs by mouth 2 (two) times daily between meals.   FISH OIL CONCENTRATE PO Take 1 tablet by mouth daily.   High Potency Iron 65 MG Tabs Take by mouth.   ipratropium-albuterol 0.5-2.5 (3) MG/3ML Soln Commonly known as: DUONEB Take 3 mLs by nebulization every 6 (six) hours as needed.   lovastatin 20 MG tablet Commonly known as: MEVACOR Take 20 mg by mouth at bedtime.   mirtazapine 15 MG tablet Commonly known as: REMERON Take 1 tablet (15 mg total) by mouth  at bedtime.   OXYQUINOLONE SULFATE VAGINAL 0.025 % Gel Place 1 application vaginally 2 (two) times a week.   PreserVision AREDS Tabs Take 1 capsule by mouth 2 (two) times daily.   Refresh 1.4-0.6 % Soln Generic drug: Polyvinyl Alcohol-Povidone PF Place 1 drop into both eyes in the morning and at bedtime.         Follow-up Information     Poggi, Marshall Cork, MD Follow up in 1 month(s).   Specialty: Orthopedic Surgery Why: repeat x-rays of her pelvis and left hip. Contact information: [place] [address] [phone]         Albina Billet, MD Follow up.   Specialty: Internal Medicine Contact information: [address]   [place] [address] [phone]                 Allergies  Allergen Reactions   Gentamicin    Levofloxacin Diarrhea     The results of significant diagnostics from this hospitalization (including imaging, microbiology, ancillary and laboratory) are listed below for reference.   Consultations:   Procedures/Studies: CT PELVIS WO CONTRAST  Result Date: 01/27/2022 CLINICAL DATA:  Left hip pain after fall.  Abnormal x-ray EXAM: CT PELVIS WITHOUT CONTRAST TECHNIQUE: Multidetector CT imaging of the pelvis was performed following the standard protocol without intravenous contrast. RADIATION DOSE REDUCTION: This exam was performed according to the departmental dose-optimization program which includes automated exposure control, adjustment of the mA and/or kV according to patient size and/or use of iterative reconstruction technique. COMPARISON:  X-ray 01/27/2022, CT 01/26/2020 FINDINGS: Bones/Joint/Cartilage Acute moderately displaced fracture of the mid left inferior pubic ramus (series 3, image 96). Acute nondisplaced fracture at the left pubic root (series 3, image 70). Acute fracture of the left sacral ala (series 8, image 73). Pelvis intact without diastasis. Bilateral hips are intact without  fracture or dislocation. Hip joint spaces are preserved. Mild degenerative changes of the bilateral SI joints. Partially imaged lower lumbar degenerative disc disease. Bones are demineralized. Ligaments Suboptimally assessed by CT. Muscles and Tendons No acute musculotendinous abnormality by CT. Soft tissues No pelvic sidewall hematoma. Mild induration within the subcutaneous soft tissues lateral to the left hip. No fluid collection or hematoma about the left hip. No inguinal lymphadenopathy. Diverticular changes of the sigmoid colon. Pessary in place. Aortoiliac atherosclerosis. IMPRESSION: 1. Acute moderately displaced fracture of the mid left inferior pubic ramus. 2. Acute nondisplaced fracture at the left pubic root. 3. Acute fracture of the left sacral ala. 4. No pelvic diastasis. Aortic Atherosclerosis (ICD10-I70.0). Electronically Signed   By: Davina Poke D.O.   On: 01/27/2022 17:34   DG Chest 2 View  Result Date: 01/27/2022 CLINICAL DATA:  Shortness of breath EXAM: CHEST - 2 VIEW COMPARISON:  Chest x-ray dated October 04, 2020 FINDINGS: The heart size and mediastinal contours are within normal limits. Emphysema. No focal consolidation. The visualized skeletal structures are unremarkable. IMPRESSION:  Background emphysema with no focal consolidation. Electronically Signed   By: Yetta Glassman M.D.   On: 01/27/2022 17:20   DG Hip Unilat With Pelvis 2-3 Views Left  Result Date: 01/27/2022 CLINICAL DATA:  Hip pain after a fall. EXAM: DG HIP (WITH OR WITHOUT PELVIS) 2-3V LEFT COMPARISON:  None Available. FINDINGS: Bones are diffusely demineralized. SI joints and symphysis pubis unremarkable. Although not definite, nondisplaced fracture left inferior pubic ramus not excluded. AP and frog-leg lateral views of the left femur show no evidence for femoral neck fracture. IMPRESSION: 1. Possible nondisplaced fracture of the left inferior pubic ramus. Inlet and outlet views of the pelvis could be used to  further evaluate as clinically warranted. 2. No evidence for left femoral neck fracture. Electronically Signed   By: Misty Stanley M.D.   On: 01/27/2022 16:25      Labs: BNP (last 3 results) Recent Labs    01/27/22 1732  BNP 361.4*   Basic Metabolic Panel: Recent Labs  Lab 01/27/22 1732  NA 134*  K 4.2  CL 97*  CO2 31  GLUCOSE 106*  BUN 31*  CREATININE 0.81  CALCIUM 9.0   Liver Function Tests: Recent Labs  Lab 01/27/22 1732  AST 26  ALT 25  ALKPHOS 65  BILITOT 0.7  PROT 6.8  ALBUMIN 3.8   No results for input(s): "LIPASE", "AMYLASE" in the last 168 hours. No results for input(s): "AMMONIA" in the last 168 hours. CBC: Recent Labs  Lab 01/27/22 1732 01/29/22 0534  WBC 20.5* 12.4*  NEUTROABS 18.5*  --   HGB 12.9 10.8*  HCT 39.7 33.8*  MCV 98.3 100.0  PLT 217 164   Cardiac Enzymes: No results for input(s): "CKTOTAL", "CKMB", "CKMBINDEX", "TROPONINI" in the last 168 hours. BNP: Invalid input(s): "POCBNP" CBG: No results for input(s): "GLUCAP" in the last 168 hours. D-Dimer No results for input(s): "DDIMER" in the last 72 hours. Hgb A1c No results for input(s): "HGBA1C" in the last 72 hours. Lipid Profile No results for input(s): "CHOL", "HDL", "LDLCALC", "TRIG", "CHOLHDL", "LDLDIRECT" in the last 72 hours. Thyroid function studies No results for input(s): "TSH", "T4TOTAL", "T3FREE", "THYROIDAB" in the last 72 hours.  Invalid input(s): "FREET3" Anemia work up No results for input(s): "VITAMINB12", "FOLATE", "FERRITIN", "TIBC", "IRON", "RETICCTPCT" in the last 72 hours. Urinalysis    Component Value Date/Time   COLORURINE YELLOW (A) 01/28/2022 1254   APPEARANCEUR CLOUDY (A) 01/28/2022 1254   APPEARANCEUR Clear 09/10/2015 1417   LABSPEC 1.011 01/28/2022 1254   PHURINE 5.0 01/28/2022 1254   GLUCOSEU NEGATIVE 01/28/2022 1254   HGBUR MODERATE (A) 01/28/2022 1254   BILIRUBINUR NEGATIVE 01/28/2022 1254   BILIRUBINUR neg 10/23/2020 1206   BILIRUBINUR  Negative 09/10/2015 1417   KETONESUR NEGATIVE 01/28/2022 1254   PROTEINUR NEGATIVE 01/28/2022 1254   UROBILINOGEN 0.2 10/23/2020 1206   NITRITE POSITIVE (A) 01/28/2022 1254   LEUKOCYTESUR LARGE (A) 01/28/2022 1254   Sepsis Labs Recent Labs  Lab 01/27/22 1732 01/29/22 0534  WBC 20.5* 12.4*   Microbiology Recent Results (from the past 240 hour(s))  Resp Panel by RT-PCR (Flu A&B, Covid) Anterior Nasal Swab     Status: None   Collection Time: 01/27/22  4:53 PM   Specimen: Anterior Nasal Swab  Result Value Ref Range Status   SARS Coronavirus 2 by RT PCR NEGATIVE NEGATIVE Final    Comment: (NOTE) SARS-CoV-2 target nucleic acids are NOT DETECTED.  The SARS-CoV-2 RNA is generally detectable in upper respiratory specimens during the acute phase of  infection. The lowest concentration of SARS-CoV-2 viral copies this assay can detect is 138 copies/mL. A negative result does not preclude SARS-Cov-2 infection and should not be used as the sole basis for treatment or other patient management decisions. A negative result may occur with  improper specimen collection/handling, submission of specimen other than nasopharyngeal swab, presence of viral mutation(s) within the areas targeted by this assay, and inadequate number of viral copies(<138 copies/mL). A negative result must be combined with clinical observations, patient history, and epidemiological information. The expected result is Negative.  Fact Sheet for Patients:  EntrepreneurPulse.com.au  Fact Sheet for Healthcare Providers:  IncredibleEmployment.be  This test is no t yet approved or cleared by the [place] FDA and  has been authorized for detection and/or diagnosis of SARS-CoV-2 by FDA under an Emergency Use Authorization (EUA). This EUA will remain  in effect (meaning this test can be used) for the duration of the COVID-19 declaration under Section 564(b)(1) of the Act,  21 U.S.C.section 360bbb-3(b)(1), unless the authorization is terminated  or revoked sooner.       Influenza A by PCR NEGATIVE NEGATIVE Final   Influenza B by PCR NEGATIVE NEGATIVE Final    Comment: (NOTE) The Xpert Xpress SARS-CoV-2/FLU/RSV plus assay is intended as an aid in the diagnosis of influenza from Nasopharyngeal swab specimens and should not be used as a sole basis for treatment. Nasal washings and aspirates are unacceptable for Xpert Xpress SARS-CoV-2/FLU/RSV testing.  Fact Sheet for Patients: EntrepreneurPulse.com.au  Fact Sheet for Healthcare Providers: IncredibleEmployment.be  This test is not yet approved or cleared by the [place] FDA and has been authorized for detection and/or diagnosis of SARS-CoV-2 by FDA under an Emergency Use Authorization (EUA). This EUA will remain in effect (meaning this test can be used) for the duration of the COVID-19 declaration under Section 564(b)(1) of the Act, 21 U.S.C. section 360bbb-3(b)(1), unless the authorization is terminated or revoked.  Performed at [facility], [address] [place], [place] [address]      Total time spend on discharging this patient, including the last patient exam, discussing the hospital stay, instructions for ongoing care as it relates to all pertinent caregivers, as well as preparing the medical discharge records, prescriptions, and/or referrals as applicable, is 30 minutes.    Enzo Bi, MD  [facility] 02/01/2022, 9:24 AM

## 2022-02-01 NOTE — Progress Notes (Signed)
[facility] facility was called and report was given to nurse Dawn. Waiting on EMS to transport.

## 2022-02-01 NOTE — TOC Progression Note (Signed)
Transition of Care [facility]) - Progression Note    Patient Details  Name: Angela Mason MRN: [id number] Date of Birth: November 11, 1932  Transition of [facility]) CM/SW [place], RN Phone Number: 02/01/2022, 11:02 AM  Clinical Narrative:    Patient going to room 103 at [facility], ems to transport, EMS has on their list and will come as soon as able Bedside nurse to call daughter and make aware  Expected Discharge Plan: [place] Barriers to Discharge: Continued Medical Work up  Expected Discharge Plan and Services Expected Discharge Plan: [place] arrangements for the past 2 months: [place]) Expected Discharge Date: 02/01/22                                     Social Determinants of Health (SDOH) Interventions    Readmission Risk Interventions     No data to display

## 2022-02-03 DIAGNOSIS — F331 Major depressive disorder, recurrent, moderate: Secondary | ICD-10-CM | POA: Diagnosis not present

## 2022-02-03 DIAGNOSIS — J449 Chronic obstructive pulmonary disease, unspecified: Secondary | ICD-10-CM | POA: Diagnosis not present

## 2022-02-03 DIAGNOSIS — S32592S Other specified fracture of left pubis, sequela: Secondary | ICD-10-CM | POA: Diagnosis not present

## 2022-02-03 DIAGNOSIS — I10 Essential (primary) hypertension: Secondary | ICD-10-CM | POA: Diagnosis not present

## 2022-02-03 DIAGNOSIS — E441 Mild protein-calorie malnutrition: Secondary | ICD-10-CM

## 2022-02-04 ENCOUNTER — Encounter: Payer: Medicare Other | Admitting: Obstetrics and Gynecology

## 2022-02-25 DIAGNOSIS — I10 Essential (primary) hypertension: Secondary | ICD-10-CM | POA: Diagnosis not present

## 2022-02-25 DIAGNOSIS — J449 Chronic obstructive pulmonary disease, unspecified: Secondary | ICD-10-CM | POA: Diagnosis not present

## 2022-02-25 DIAGNOSIS — S32592S Other specified fracture of left pubis, sequela: Secondary | ICD-10-CM | POA: Diagnosis not present

## 2022-02-25 DIAGNOSIS — E441 Mild protein-calorie malnutrition: Secondary | ICD-10-CM

## 2022-02-25 DIAGNOSIS — F3341 Major depressive disorder, recurrent, in partial remission: Secondary | ICD-10-CM | POA: Diagnosis not present

## 2022-03-03 DIAGNOSIS — R4189 Other symptoms and signs involving cognitive functions and awareness: Secondary | ICD-10-CM | POA: Insufficient documentation

## 2022-03-04 ENCOUNTER — Ambulatory Visit (INDEPENDENT_AMBULATORY_CARE_PROVIDER_SITE_OTHER): Payer: Medicare Other | Admitting: Obstetrics and Gynecology

## 2022-03-04 ENCOUNTER — Encounter: Payer: Self-pay | Admitting: Obstetrics and Gynecology

## 2022-03-04 VITALS — BP 170/79 | HR 84 | Ht <= 58 in | Wt 88.0 lb

## 2022-03-04 DIAGNOSIS — N813 Complete uterovaginal prolapse: Secondary | ICD-10-CM | POA: Diagnosis not present

## 2022-03-04 DIAGNOSIS — Z4689 Encounter for fitting and adjustment of other specified devices: Secondary | ICD-10-CM

## 2022-03-04 DIAGNOSIS — N958 Other specified menopausal and perimenopausal disorders: Secondary | ICD-10-CM | POA: Diagnosis not present

## 2022-03-04 DIAGNOSIS — N952 Postmenopausal atrophic vaginitis: Secondary | ICD-10-CM | POA: Diagnosis not present

## 2022-03-04 NOTE — Patient Instructions (Signed)
How to Use a Vaginal Pessary  A vaginal pessary is a removable device that is placed into your vagina to support pelvic organs that droop. These organs include your uterus, bladder, and rectum. When your pelvic organs drop down into your vagina, it causes a condition called pelvic organ prolapse (POP). A pessary may be an alternative to surgery for women with POP. It may help women who leak urine when they strain or exercise (stress incontinence). This is a symptom of POP. A vaginal pessary may also be a temporary treatment for stress incontinence during pregnancy. There are several types of pessaries. All types are usually made of silicone. You can insert and remove some on your own. Other types must be inserted and removed by your health care provider at office visits. The reason you are using a pessary and the severity of your condition will determine which one is best for you. It is also important to find the right size. A pessary that is too small may fall out. A pessary that is too large may cause pain or discomfort. Your health care provider will do a physical exam to find the correct size and fit for your pessary. It may take several appointments to find the best fit for you. If you can be fit with the type of pessary that you can insert, remove, and clean yourself, your health care provider will teach you how to use your pessary at home. You may have checkups every few months. If you have the type of pessary that needs to be inserted and removed by your health care provider, you will have appointments every few months to have the pessary removed, cleaned, and replaced. What are the risks? When properly fitted and cared for, risks of using a vaginal pessary can be small. However, there can be problems that may include: Vaginal discharge. Vaginal bleeding. A bad smell coming from your vagina. Scraping of the skin inside your vagina. How to use your pessary Follow your health care provider's  instructions for using a pessary. These instructions may vary, depending on the type of pessary you have. To insert a pessary: Wash your hands with soap and water for at least 20 seconds. Squeeze or fold the pessary in half and lubricate the tip with a water-based lubricant. Insert the pessary into your vagina. It will unfold and provide support. To remove the pessary, gently tug it out of your vagina. You can remove the pessary every night or after several days. You can also remove it to have sex. How to care for your pessary If you have a pessary that you can remove: Clean your pessary with soap and water. Rinse well. Dry it completely before inserting it back into your vagina. Follow these instructions at home: Take over-the-counter and prescription medicines only as told by your health care provider. Your health care provider may prescribe an estrogen cream to moisten your vagina. Keep all follow-up visits. This is important. Contact a health care provider if: You feel any pain or discomfort when your pessary is in place. You continue to have stress incontinence. You have trouble keeping your pessary from falling out. You have an unusual vaginal discharge that is blood-tinged or smells bad. Summary A vaginal pessary is a removable device that is placed into your vagina to support pelvic organs that droop. This condition is called pelvic organ prolapse (POP). There are several types of pessaries. Some you can insert and remove on your own. Others must be inserted and   removed by your health care provider. The best type for you depends on the reason you are using a pessary and the severity of your condition. It is also important to find the right size. If you can use the type that you insert and remove on your own, your health care provider will teach you how to use it and schedule checkups every few months. If you have the type that needs to be inserted and removed by your health care  provider, you will have regular appointments to have your pessary removed, cleaned, and replaced. This information is not intended to replace advice given to you by your health care provider. Make sure you discuss any questions you have with your health care provider. Document Revised: 01/12/2020 Document Reviewed: 01/12/2020 Elsevier Patient Education  2023 Elsevier Inc.  

## 2022-03-04 NOTE — Progress Notes (Signed)
    GYNECOLOGY PROGRESS NOTE  Subjective:    [name], female    DOB: May 11, 1933, 88 y.o.   MRN: [id number]  HPI  Patient is a 88 y.o. G37P1001 female who presents for Pessary Maintenance. Notes that she hasn't been to her scheduled visits due to being in rehab after a fall. Last seen in 2022-12-03. She has a history of cystocele, rectocele, complete procidentia, and incomplete bladder emptying. Also with significant vaginal atrophy. Notes some occasional leaking. She has been using the Trimo-San gel only, stopped using the Premarin due to concerns for risk of cancer.   Patient also reports that her husband passed away in 2022-12-03.    The following portions of the patient's history were reviewed and updated as appropriate: allergies, current medications, past family history, past medical history, past social history, past surgical history, and problem list.   Review of Systems Pertinent items are noted in HPI.   Objective:   Blood pressure (!) 170/79, pulse 84, height '4\' 10"'$  (1.473 m), weight 88 lb (39.9 kg). Body mass index is 18.39 kg/m. General appearance: alert, cooperative, and no distress Abdomen: soft, non-tender; bowel sounds normal; no masses,  no organomegaly Pelvic: the patient's  size 2  1/4 ring with support pessary was removed without complications. External exam appears normal.  Speculum examination revealed mild to moderate atrophic vaginal mucosa, no lesions or abrasions, scant dark brown blood and moderate gray discharge with mild odor.  Assessment:   1. 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 Genitourinary syndrome of menopause   3. Vaginal atrophy   4. Pessary maintenance      Plan:   -Continue pessary checks every 3 months. -Continue use of Premarin cream for pessary cream. Continue use of Trimo-san gel for routine pessary maintenance.    Rubie Maid, MD [facility]

## 2022-04-30 DIAGNOSIS — E785 Hyperlipidemia, unspecified: Secondary | ICD-10-CM | POA: Diagnosis present

## 2022-04-30 DIAGNOSIS — I160 Hypertensive urgency: Secondary | ICD-10-CM | POA: Diagnosis present

## 2022-04-30 DIAGNOSIS — E039 Hypothyroidism, unspecified: Secondary | ICD-10-CM | POA: Diagnosis present

## 2022-04-30 DIAGNOSIS — E782 Mixed hyperlipidemia: Secondary | ICD-10-CM | POA: Diagnosis present

## 2022-05-01 LAB — BASIC METABOLIC PANEL
BUN: 10 (ref 4–21)
CO2: 33 — AB (ref 13–22)
Chloride: 89 — AB (ref 99–108)
Creatinine: 0.5 (ref 0.5–1.1)
Glucose: 113
Potassium: 4.1 mEq/L (ref 3.5–5.1)
Sodium: 128 — AB (ref 137–147)

## 2022-05-01 LAB — LIPID PANEL
Cholesterol: 175 (ref 0–200)
HDL: 64 (ref 35–70)
LDL Cholesterol: 91
Triglycerides: 103 (ref 40–160)

## 2022-05-01 LAB — HEPATIC FUNCTION PANEL
ALT: 11 U/L (ref 7–35)
AST: 17 (ref 13–35)
Alkaline Phosphatase: 84 (ref 25–125)
Bilirubin, Total: 0.6

## 2022-05-01 LAB — HEMOGLOBIN A1C

## 2022-05-01 LAB — COMPREHENSIVE METABOLIC PANEL
Albumin: 4.1 (ref 3.5–5.0)
Calcium: 9.3 (ref 8.7–10.7)

## 2022-05-12 ENCOUNTER — Encounter: Payer: Self-pay | Admitting: Student

## 2022-05-12 ENCOUNTER — Ambulatory Visit: Payer: Medicare Other | Admitting: Student

## 2022-05-12 VITALS — BP 152/90 | HR 70 | Temp 98.7°F | Ht <= 58 in | Wt 77.0 lb

## 2022-05-12 DIAGNOSIS — G4700 Insomnia, unspecified: Secondary | ICD-10-CM | POA: Diagnosis not present

## 2022-05-12 DIAGNOSIS — R636 Underweight: Secondary | ICD-10-CM

## 2022-05-12 DIAGNOSIS — N813 Complete uterovaginal prolapse: Secondary | ICD-10-CM

## 2022-05-12 DIAGNOSIS — W19XXXD Unspecified fall, subsequent encounter: Secondary | ICD-10-CM

## 2022-05-12 DIAGNOSIS — F331 Major depressive disorder, recurrent, moderate: Secondary | ICD-10-CM

## 2022-05-12 DIAGNOSIS — H353231 Exudative age-related macular degeneration, bilateral, with active choroidal neovascularization: Secondary | ICD-10-CM

## 2022-05-12 DIAGNOSIS — J449 Chronic obstructive pulmonary disease, unspecified: Secondary | ICD-10-CM | POA: Diagnosis not present

## 2022-05-12 DIAGNOSIS — E871 Hypo-osmolality and hyponatremia: Secondary | ICD-10-CM

## 2022-05-12 DIAGNOSIS — E785 Hyperlipidemia, unspecified: Secondary | ICD-10-CM

## 2022-05-12 DIAGNOSIS — S32592A Other specified fracture of left pubis, initial encounter for closed fracture: Secondary | ICD-10-CM

## 2022-05-12 DIAGNOSIS — R09A2 Foreign body sensation, throat: Secondary | ICD-10-CM

## 2022-05-12 DIAGNOSIS — I10 Essential (primary) hypertension: Secondary | ICD-10-CM

## 2022-05-12 DIAGNOSIS — E43 Unspecified severe protein-calorie malnutrition: Secondary | ICD-10-CM

## 2022-05-12 DIAGNOSIS — R351 Nocturia: Secondary | ICD-10-CM

## 2022-05-12 DIAGNOSIS — N3289 Other specified disorders of bladder: Secondary | ICD-10-CM

## 2022-05-12 NOTE — Progress Notes (Signed)
Location:  [place] [facility]  Provider: Georgi Navarrete  Code Status: Full Code Goals of Care:     05/12/2022    2:32 PM  Advanced Directives  Does Patient Have a Medical Advance Directive? Yes  Type of Paramedic of [place];Living will  Does patient want to make changes to medical advance directive? No - Patient declined  Copy of [place] in Chart? No - copy requested     Chief Complaint  Patient presents with   [place] patient to establish care at [facility]. Discuss medications listed as allergies, patient states she did not have an allergic reaction to the listed medications. Discuss abilify, patient stopped on her own. High fall risk. History of abnormal sodium. Here with neighbor, Hoyle Sauer    HPI: Patient is a 88 y.o. female seen today for medical management of chronic diseases.    Numerous falls, back apin and was seen. She came over to [place] and coulsn't get it to come back down. They did x-rays at that time. When she fell she hit to the left of her spine. They did Rib and thoracic spine x-rays and were negative. She has been spending more time in bed. Encouraged to get out of bed. She says a heating pad helps with her pain. She has been taking tyelenol as well for the pain. She fell when it wasn't handy in the middle of the night.   She states she has been eating really well in general. Typical breakfast is bran flakes and 1/2 banana or oatmeal. Lunch sometimes left over grilled cheese w/ tomato, egg salad, or tuna salad. Dinner some protein and vegetables. No supplements. She has a lot of constipation so she takes   She saw Dr. Hall Busing 1 week ago.   Hyponatremia - her most recent level was 128 on 05/01/2022. She was eating a lot more sweets. Discussed adding some salt to her diet could be good.  Mood - d/c'd abilify - she gets a little hiper. She's afraid of taking the abilify because her vision has changed. She gets too  uptight. She likes to do house work. She sees orthopedic doctor 1 more time for the fractures that she has in the pelvis. When she is taking it she feels like it numbs her brain a bit. She wants to do things ubt knows she cant. She doesn't take mirtazapine every night She doesn't want to take them every day.   Hypertension- Some normal Bps I nclinic no ambulatory. Checks.                                      Macular degeneration - now has it in both eyes. Sees them every three months.   Up every 2 hours at night for urination.   Sinus drainage-- she sometimes  Crisof[place] she is excitable she can get short of breath with exertion.   Functional assessment: House here on campus. She has someone help with stockings and they are around while she is showering. She does laundry. They help clean and make her bed, because of lifting mattress for fitted sheets. She doesn't drive - that stopped about 1 year ago. [facility] made that decision.   Her friend Hoyle Sauer is here today. Her husband died in November 19, 2022 >60 years. He was in [facility] and she went to see him every other day.  Daughter is in [place]. Not much social support outside of this community.  Past Medical History:  Diagnosis Date   Cancer ([place])    SKIN   COPD, mild (HCC)    Dyspnea    Edema    HLD (hyperlipidemia)    HTN (hypertension)    Hypothyroidism    Macular degeneration, bilateral    Varicosities     Past Surgical History:  Procedure Laterality Date   CATARACT EXTRACTION W/PHACO Right 02/03/2017   Procedure: CATARACT EXTRACTION PHACO AND INTRAOCULAR LENS PLACEMENT ([place]);  Surgeon: Birder Robson, MD;  Location: [facility] ORS;  Service: Ophthalmology;  Laterality: Right;  [place] 00:57.2 AP% 18.5 CDE 10.58 Fluid pack lot # [id number]   CATARACT EXTRACTION W/PHACO Left 02/24/2017   Procedure: CATARACT EXTRACTION PHACO AND INTRAOCULAR LENS PLACEMENT (IOC);  Surgeon: Birder Robson, MD;  Location: [facility] ORS;  Service: Ophthalmology;  Laterality:  Left;  [place] 01:12 AP% 25.0 CDE 18.03 Fluid pack lot # [id number]   MOHS SURGERY  2015   nose   SALIVARY GLAND SURGERY Left 2012   TONSILLECTOMY  1940    Allergies  Allergen Reactions   Gentamicin    Levofloxacin Diarrhea    Outpatient Encounter Medications as of 05/12/2022  Medication Sig   acetaminophen (TYLENOL) 500 MG tablet Take 500 mg by mouth as directed. Every 4-6 hours as needed   atenolol (TENORMIN) 25 MG tablet Take 1 tablet (25 mg total) by mouth daily.   calcium-vitamin D (OSCAL WITH D) 500-200 MG-UNIT tablet Take 2 tablets by mouth daily.   Ferrous Sulfate Dried (HIGH POTENCY IRON) 65 MG TABS Take by mouth.   lovastatin (MEVACOR) 20 MG tablet Take 20 mg by mouth at bedtime.   mirtazapine (REMERON) 15 MG tablet Take 1 tablet (15 mg total) by mouth at bedtime.   Multiple Vitamins-Minerals (PRESERVISION AREDS) TABS Take 1 capsule by mouth 2 (two) times daily.   Omega-3 Fatty Acids (FISH OIL CONCENTRATE PO) Take 1 tablet by mouth daily.    OXYQUINOLONE SULFATE VAGINAL 0.025 % GEL Place 1 application vaginally 2 (two) times a week.   Polyvinyl Alcohol-Povidone PF (REFRESH) 1.4-0.6 % SOLN Place 1 drop into both eyes in the morning and at bedtime.   ARIPiprazole (ABILIFY) 2 MG tablet Take 1 tablet (2 mg total) by mouth daily with breakfast. (Patient not taking: Reported on 05/12/2022)   [DISCONTINUED] feeding supplement (ENSURE ENLIVE / ENSURE PLUS) LIQD Take 237 mLs by mouth 2 (two) times daily between meals. (Patient not taking: Reported on 03/04/2022)   [DISCONTINUED] ipratropium-albuterol (DUONEB) 0.5-2.5 (3) MG/3ML SOLN Take 3 mLs by nebulization every 6 (six) hours as needed. (Patient not taking: Reported on 03/04/2022)   Facility-Administered Encounter Medications as of 05/12/2022  Medication   estradiol (ESTRACE) vaginal cream 1 Applicatorful    Review of Systems:  Review of Systems  All other systems reviewed and are negative.   Health Maintenance  Topic Date Due    COVID-19 Vaccine (1) Never done   TETANUS/TDAP  Never done   Zoster Vaccines- Shingrix (1 of 2) Never done   DEXA SCAN  Never done   Pneumonia Vaccine 7+ Years old (2 - PPSV23 or PCV20) 08/09/2019   INFLUENZA VACCINE  Completed   HPV VACCINES  Aged Out    Physical Exam: Vitals:   05/12/22 1434  BP: (!) 172/98  Pulse: 70  Temp: 98.7 F (37.1 C)  TempSrc: Temporal  SpO2: 90%  Weight: 77 lb (34.9 kg)  Height: '4\' 10"'$  (1.473 m)  Body mass index is 16.09 kg/m. Physical Exam Constitutional:      Comments: Thin, frail  HENT:     Head: Normocephalic and atraumatic.     Mouth/Throat:     Comments: Cobblestoning of posterior pharnyx Cardiovascular:     Rate and Rhythm: Normal rate and regular rhythm.  Pulmonary:     Effort: Pulmonary effort is normal.     Comments: Slight end expiratory wheezes anteriorly.  Abdominal:     General: Bowel sounds are normal.     Palpations: Abdomen is soft.  Skin:    General: Skin is warm and dry.     Capillary Refill: Capillary refill takes 2 to 3 seconds.  Neurological:     General: No focal deficit present.     Mental Status: She is alert and oriented to person, place, and time.     Labs reviewed: Basic Metabolic Panel: Recent Labs    01/27/22 1732  NA 134*  K 4.2  CL 97*  CO2 31  GLUCOSE 106*  BUN 31*  CREATININE 0.81  CALCIUM 9.0   Liver Function Tests: Recent Labs    01/27/22 1732  AST 26  ALT 25  ALKPHOS 65  BILITOT 0.7  PROT 6.8  ALBUMIN 3.8   No results for input(s): "LIPASE", "AMYLASE" in the last 8760 hours. No results for input(s): "AMMONIA" in the last 8760 hours. CBC: Recent Labs    01/27/22 1732 01/29/22 0534  WBC 20.5* 12.4*  NEUTROABS 18.5*  --   HGB 12.9 10.8*  HCT 39.7 33.8*  MCV 98.3 100.0  PLT 217 164   Lipid Panel: No results for input(s): "CHOL", "HDL", "LDLCALC", "TRIG", "CHOLHDL", "LDLDIRECT" in the last 8760 hours. No results found for: "HGBA1C"  Procedures since last visit: No  results found.  Assessment/Plan 1. Accidental fall, subsequent encounter Patient has had 2 falls with injury this year. She has not had PT as of yet. Will plan for PT at [facility] for numerous falls with injury.   2. Chronic obstructive pulmonary disease, unspecified COPD type ([place]) Slight end expiratory wheeze. Previously tried inhalers however patient didn't think they were helpful and self discontinued.   3. MDD (major depressive disorder), recurrent episode, moderate (HCC) Patient's PHQ-9 was a 6 today. Some concern that patient has stopped abilify abruptly and only takes mirtazapine if she feels like she needs it for sleep. Discussed potential need for medication consistently for mood, appetite, and sleep. Patient declined taking either medication regularly. Will continue to monitor. Will defer starting a new medication since she does not commit to taking a medication daily for her mood. Hyponatremia as well limits medication choices.   4. Pubic ramus fracture, left, closed, initial encounter ([place]) Continues to recover from fracture incurred on 01/27/2022. She has not had PT since acutely following her admission. Will recommend PT. Patient has remained on iron supplementation since admission, will recheck CBC at follow up appointment.   5. Insomnia, unspecified type Patient states it's usually when her mind won't shut down. She is stable with as needed mirtazapine 15 mg.   6. Underweight  Protein-calorie malnutrition, severe Advised patient to drink 1-2 insure or boost per day.   7. Globus sensation Discussed patient's issue of feeling like she has increased secretions with brushing her teeth and without. She feels like she has something stuck in the back of her throat. Discussed importance of hydration. No issues with swallowing liquids or solids. Will plan for trial of Flonase.   8. 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 Seen by urogynecology. Will defer further treatment at this time.   9. Hyperlipidemia, unspecified hyperlipidemia type Continue lovastatin 20 mg nightly.   10. Hyponatremia Chronically low. Discussed adding salt to her foods and rechecking labs in 1 month. If persistently low will plan for supplementation. Discussed possibility of gatorade vs liquid IV OTC. Also discussed salt tablet supplementation if persistently low.   11. Elevated blood pressure reading in office with diagnosis of hypertension  Primary hypertension BP elevated today and improved on repeat. Pt has not had any medications. Discussed concern for need of treatment. Patient has BP cuff at home. Will check every other day with assistance of neighbor. Write down values and return to see nurse in 2 weeks. Will see MD in 1 mo to follow up BP. If elevated will consider low dose of losartan vs amlodipine (discussed amlodipine today because no labs collected). Continue home Atenolol 25 ng daily.   12. Macular Degeneration, exudative No longer driving. [facility] Considering getting them closer and transitioning to a local provider.   Labs/tests ordered:  * No order type specified * Next appt:  Visit date not found  VTomasa Rand MD, [phone]

## 2022-05-12 NOTE — Patient Instructions (Addendum)
For your sodium - it is okay to add some salt in your diet. We will recheck it at your next appointment.   Continue to drink plenty of water - 8 oz with meals and 6 ounces between meals. Continue to increase protein in each of your meals. Ensure, boost, etc can be helpful to make sure you have enough nutrition intake.   You will start physical therapy -- I can order this for you today.   For your blood pressure, please check the levels every other day and come back for a nurse visit in 2 weeks with your log. I will see you in 1 month.   For your drainage, please start Flonase 2 sprays daily. You should start to see improvement in the next 2 weeks.

## 2022-06-04 ENCOUNTER — Encounter: Payer: Self-pay | Admitting: Obstetrics and Gynecology

## 2022-06-04 ENCOUNTER — Ambulatory Visit (INDEPENDENT_AMBULATORY_CARE_PROVIDER_SITE_OTHER): Payer: Medicare Other | Admitting: Obstetrics and Gynecology

## 2022-06-04 VITALS — BP 116/61 | HR 70 | Resp 16 | Ht <= 58 in | Wt 83.7 lb

## 2022-06-04 DIAGNOSIS — N952 Postmenopausal atrophic vaginitis: Secondary | ICD-10-CM

## 2022-06-04 DIAGNOSIS — N813 Complete uterovaginal prolapse: Secondary | ICD-10-CM | POA: Diagnosis not present

## 2022-06-04 DIAGNOSIS — Z780 Asymptomatic menopausal state: Secondary | ICD-10-CM

## 2022-06-04 DIAGNOSIS — Z4689 Encounter for fitting and adjustment of other specified devices: Secondary | ICD-10-CM | POA: Diagnosis not present

## 2022-06-04 DIAGNOSIS — Z8781 Personal history of (healed) traumatic fracture: Secondary | ICD-10-CM

## 2022-06-04 NOTE — Progress Notes (Signed)
    GYNECOLOGY PROGRESS NOTE  Subjective:    [name], female    DOB: 07/18/1933, 88 y.o.   MRN: [id number]  HPI  Patient is a 88 y.o. G61P1001 female who presents for pessary check. She has a history of cystocele, rectocele, complete procidentia, and incomplete bladder emptying. Also with significant vaginal atrophy. Notes some occasional leaking. She has been using the Trimo-San gel only, stopped using the Premarin due to concerns for risk of cancer, but will use daily for 1 week prior to pessary check for ease of removal .  She reports that she has been having some vaginal bleeding on and off for "some time".  Patient is accompanied by her friend today, who reports that patient was recently released from Orthopedic Surgery care due to sustaining 2 falls in the past several months. Patient has areas of fractures of pelvis. Recommending bone density scan.   The following portions of the patient's history were reviewed and updated as appropriate: allergies, current medications, past family history, past medical history, past social history, past surgical history, and problem list.  Review of Systems Pertinent items noted in HPI and remainder of comprehensive ROS otherwise negative.   Objective:   Blood pressure 116/61, pulse 70, resp. rate 16, height '4\' 10"'$  (1.473 m), weight 83 lb 11.2 oz (38 kg).  There is no height or weight on file to calculate BMI. General appearance: alert and no distress Abdomen: soft, non-tender; bowel sounds normal; no masses,  no organomegaly.  Pelvic: The patient's  size 2  1/4 ring with support pessary was removed without complications. External exam appears normal.  Speculum examination revealed moderate atrophic vaginal mucosa, mild abrasion on posterior vaginal wall, no lesions or erosion. Scant dark red blood in vaginal vault.   Assessment:   1. Pessary maintenance   2. Vaginal atrophy   3. Cystocele and rectocele with complete uterovaginal  prolapse   4. Postmenopausal   5. History of fracture due to fall      Plan:   -Continue pessary checks every 3 months. -Continue use of Premarin cream for pessary removal. Also encouraged use of cream at least once weekly to prevent abrasions.  - Continue use of Trimo-san gel for routine pessary maintenance.  - Dexa scan ordered due to h/o fall with fracture.    Angela Maid, MD [facility]

## 2022-06-04 NOTE — Patient Instructions (Signed)
How to Use a Vaginal Pessary  A vaginal pessary is a removable device that is placed into your vagina to support pelvic organs that droop. These organs include your uterus, bladder, and rectum. When your pelvic organs drop down into your vagina, it causes a condition called pelvic organ prolapse (POP). A pessary may be an alternative to surgery for women with POP. It may help women who leak urine when they strain or exercise (stress incontinence). This is a symptom of POP. A vaginal pessary may also be a temporary treatment for stress incontinence during pregnancy. There are several types of pessaries. All types are usually made of silicone. You can insert and remove some on your own. Other types must be inserted and removed by your health care provider at office visits. The reason you are using a pessary and the severity of your condition will determine which one is best for you. It is also important to find the right size. A pessary that is too small may fall out. A pessary that is too large may cause pain or discomfort. Your health care provider will do a physical exam to find the correct size and fit for your pessary. It may take several appointments to find the best fit for you. If you can be fit with the type of pessary that you can insert, remove, and clean yourself, your health care provider will teach you how to use your pessary at home. You may have checkups every few months. If you have the type of pessary that needs to be inserted and removed by your health care provider, you will have appointments every few months to have the pessary removed, cleaned, and replaced. What are the risks? When properly fitted and cared for, risks of using a vaginal pessary can be small. However, there can be problems that may include: Vaginal discharge. Vaginal bleeding. A bad smell coming from your vagina. Scraping of the skin inside your vagina. How to use your pessary Follow your health care provider's  instructions for using a pessary. These instructions may vary, depending on the type of pessary you have. To insert a pessary: Wash your hands with soap and water for at least 20 seconds. Squeeze or fold the pessary in half and lubricate the tip with a water-based lubricant. Insert the pessary into your vagina. It will unfold and provide support. To remove the pessary, gently tug it out of your vagina. You can remove the pessary every night or after several days. You can also remove it to have sex. How to care for your pessary If you have a pessary that you can remove: Clean your pessary with soap and water. Rinse well. Dry it completely before inserting it back into your vagina. Follow these instructions at home: Take over-the-counter and prescription medicines only as told by your health care provider. Your health care provider may prescribe an estrogen cream to moisten your vagina. Keep all follow-up visits. This is important. Contact a health care provider if: You feel any pain or discomfort when your pessary is in place. You continue to have stress incontinence. You have trouble keeping your pessary from falling out. You have an unusual vaginal discharge that is blood-tinged or smells bad. Summary A vaginal pessary is a removable device that is placed into your vagina to support pelvic organs that droop. This condition is called pelvic organ prolapse (POP). There are several types of pessaries. Some you can insert and remove on your own. Others must be inserted and  removed by your health care provider. The best type for you depends on the reason you are using a pessary and the severity of your condition. It is also important to find the right size. If you can use the type that you insert and remove on your own, your health care provider will teach you how to use it and schedule checkups every few months. If you have the type that needs to be inserted and removed by your health care  provider, you will have regular appointments to have your pessary removed, cleaned, and replaced. This information is not intended to replace advice given to you by your health care provider. Make sure you discuss any questions you have with your health care provider. Document Revised: 01/12/2020 Document Reviewed: 01/12/2020 Elsevier Patient Education  [place].

## 2022-06-16 ENCOUNTER — Ambulatory Visit: Payer: Medicare Other | Admitting: Student

## 2022-06-16 ENCOUNTER — Encounter: Payer: Self-pay | Admitting: Student

## 2022-06-16 VITALS — BP 128/84 | HR 62 | Temp 97.5°F | Ht <= 58 in | Wt 85.0 lb

## 2022-06-16 DIAGNOSIS — I10 Essential (primary) hypertension: Secondary | ICD-10-CM | POA: Diagnosis not present

## 2022-06-16 DIAGNOSIS — F411 Generalized anxiety disorder: Secondary | ICD-10-CM | POA: Diagnosis not present

## 2022-06-16 DIAGNOSIS — E43 Unspecified severe protein-calorie malnutrition: Secondary | ICD-10-CM | POA: Diagnosis not present

## 2022-06-16 DIAGNOSIS — E871 Hypo-osmolality and hyponatremia: Secondary | ICD-10-CM

## 2022-06-16 DIAGNOSIS — F331 Major depressive disorder, recurrent, moderate: Secondary | ICD-10-CM | POA: Diagnosis not present

## 2022-06-16 DIAGNOSIS — R636 Underweight: Secondary | ICD-10-CM

## 2022-06-16 DIAGNOSIS — W19XXXD Unspecified fall, subsequent encounter: Secondary | ICD-10-CM

## 2022-06-16 MED ORDER — MIRTAZAPINE 7.5 MG PO TABS: 7.5000 mg | ORAL_TABLET | Freq: Every day | ORAL | 2 refills | Status: DC

## 2022-06-16 MED ORDER — ARIPIPRAZOLE 2 MG PO TABS: 2.0000 mg | ORAL_TABLET | Freq: Every day | ORAL | 2 refills | Status: DC

## 2022-06-16 NOTE — Progress Notes (Signed)
Location:  [facility]  Provider: Mylena Sedberry  Code Status: Full Code Goals of Care:     06/16/2022    2:25 PM  Advanced Directives  Does Patient Have a Medical Advance Directive? Yes  Type of Paramedic of [place];Living will  Does patient want to make changes to medical advance directive? No - Patient declined  Copy of [place] in Chart? No - copy requested     Chief Complaint  Patient presents with   Medical Management of Chronic Issues    Medical Management of Chronic Issues    HPI: Patient is a 88 y.o. female seen today for medical management of chronic diseases.    She feels groggy in the morning. She is going to sleep around 9:45 and wakes up at 7. Sometimes she gets more sleep than that. She stopped taking her medication.  She is still in physical therapy - 2x per week and things are going well.   She started taking the Abilify again today. And   Sometimes she doesn't drink   Appetite has been good. She doesn't skip meals. She drinks boost (20 g of protein). She drinks 1 full one a day, splits up.   She is urinating fine. Bowel movements are crazy- she has been taking husk - but isn't any more. She doesn't have diarrhea. She is sitting on the toilet for a long time. It makes her anxious.  She has had no more falls.  (Her daughter talks to her every day). She orders meals form the [employer]. She makes her own vegetables - broccoli, beans, carrots, sweet potatoes.   She has pill box.   Past Medical History:  Diagnosis Date   Cancer ([place])    SKIN   COPD, mild ([facility])    Dyspnea    Edema    HLD (hyperlipidemia)    HTN (hypertension)    Hypothyroidism    Macular degeneration, bilateral    Varicosities     Past Surgical History:  Procedure Laterality Date   CATARACT EXTRACTION W/PHACO Right 02/03/2017   Procedure: CATARACT EXTRACTION PHACO AND INTRAOCULAR LENS PLACEMENT ([place]);  Surgeon: Birder Robson, MD;  Location:  [facility];  Service: Ophthalmology;  Laterality: Right;  [place] 00:57.2 AP% 18.5 CDE 10.58 Fluid pack lot # [id number]   CATARACT EXTRACTION W/PHACO Left 02/24/2017   Procedure: CATARACT EXTRACTION PHACO AND INTRAOCULAR LENS PLACEMENT ([facility]);  Surgeon: Birder Robson, MD;  Location: [facility] ORS;  Service: Ophthalmology;  Laterality: Left;  [place] 01:12 AP% 25.0 CDE 18.03 Fluid pack lot # [id number]   MOHS SURGERY  2015   nose   SALIVARY GLAND SURGERY Left 2012   TONSILLECTOMY  1940    Allergies  Allergen Reactions   Gentamicin    Levofloxacin Diarrhea    Outpatient Encounter Medications as of 06/16/2022  Medication Sig   ARIPiprazole (ABILIFY) 2 MG tablet Take 1 tablet (2 mg total) by mouth daily with breakfast.   atenolol (TENORMIN) 25 MG tablet Take 1 tablet (25 mg total) by mouth daily.   calcium-vitamin D (OSCAL WITH D) 500-200 MG-UNIT tablet Take 2 tablets by mouth daily.   Ferrous Sulfate Dried (HIGH POTENCY IRON) 65 MG TABS Take by mouth.   lovastatin (MEVACOR) 20 MG tablet Take 20 mg by mouth at bedtime.   mirtazapine (REMERON) 15 MG tablet Take 1 tablet (15 mg total) by mouth at bedtime.   Multiple Vitamins-Minerals (PRESERVISION AREDS) TABS Take 1 capsule by mouth 2 (two) times daily.  Omega-3 Fatty Acids (FISH OIL CONCENTRATE PO) Take 1 tablet by mouth daily.    OXYQUINOLONE SULFATE VAGINAL 0.025 % GEL Place 1 application vaginally 2 (two) times a week.   Polyvinyl Alcohol-Povidone PF (REFRESH) 1.4-0.6 % SOLN Place 1 drop into both eyes in the morning and at bedtime.   Facility-Administered Encounter Medications as of 06/16/2022  Medication   estradiol (ESTRACE) vaginal cream 1 Applicatorful    Review of Systems:  Review of Systems  All other systems reviewed and are negative.   Health Maintenance  Topic Date Due   Medicare Annual Wellness (AWV)  Never done   COVID-19 Vaccine (1) Never done   Zoster Vaccines- Shingrix (1 of 2) Never done   DEXA SCAN  Never done    Pneumonia Vaccine 62+ Years old (2 - PPSV23 or PCV20) 08/09/2019   INFLUENZA VACCINE  Completed   HPV VACCINES  Aged Out    Physical Exam: Vitals:   06/16/22 1423  BP: 128/84  Pulse: 62  Temp: (!) 97.5 F (36.4 C)  SpO2: 99%  Weight: 85 lb (38.6 kg)  Height: '4\' 10"'$  (1.473 m)   Body mass index is 17.77 kg/m. Physical Exam Constitutional:      Appearance: Normal appearance.  Cardiovascular:     Rate and Rhythm: Normal rate and regular rhythm.     Pulses: Normal pulses.     Heart sounds: Normal heart sounds.  Pulmonary:     Effort: Pulmonary effort is normal.     Breath sounds: Normal breath sounds.  Abdominal:     General: Abdomen is flat.  Skin:    General: Skin is warm and dry.     Capillary Refill: Capillary refill takes less than 2 seconds.  Neurological:     Mental Status: She is alert and oriented to person, place, and time.     Labs reviewed: Basic Metabolic Panel: Recent Labs    01/27/22 1732 05/01/22 0000  NA 134* 128*  K 4.2 4.1  CL 97* 89*  CO2 31 33*  GLUCOSE 106*  --   BUN 31* 10  CREATININE 0.81 0.5  CALCIUM 9.0 9.3   Liver Function Tests: Recent Labs    01/27/22 1732 05/01/22 0000  AST 26 17  ALT 25 11  ALKPHOS 65 84  BILITOT 0.7  --   PROT 6.8  --   ALBUMIN 3.8 4.1   No results for input(s): "LIPASE", "AMYLASE" in the last 8760 hours. No results for input(s): "AMMONIA" in the last 8760 hours. CBC: Recent Labs    01/27/22 1732 01/29/22 0534  WBC 20.5* 12.4*  NEUTROABS 18.5*  --   HGB 12.9 10.8*  HCT 39.7 33.8*  MCV 98.3 100.0  PLT 217 164   Lipid Panel: Recent Labs    05/01/22 0000  CHOL 175  HDL 64  LDLCALC 91  TRIG 103   Lab Results  Component Value Date   HGBA1C 13.0 05/01/2022    Procedures since last visit: No results found.  Assessment/Plan 1. MDD (major depressive disorder), recurrent episode, moderate ([place]) 2. GAD (generalized anxiety disorder) Patient agrees to restart Abilify 2 mg daily. Will  send refill today. She also takes Remeron for poor sleep due to anxiety which helps.   3. Primary hypertension BP is erratic ranging from normal to hypertensive. Patient does not have adequate hydration. Plan to continue atenolol. Will consider changing to carvedilol at follow up based on her updated BP readings.   5. Hyponatremia Patient has had better  appetite and eating more. Will check sodium levels in 6 weeks.   4. Protein-calorie malnutrition, severe 6. Underweight Patient is eating well. Drinks boost 20 g of protein twice daily. She has gained 2 lbs in the last month. Will follow up in 1 month. Continue Mirtazapine 7.5 mg nightly.   7. Accidental fall, subsequent encounter 01/27/2022 had a closed fracture of pubic ramus. No new falls. Continue PT 2x weekly.     Labs/tests ordered:  * No order type specified * Next appt:  Visit date not found

## 2022-06-26 ENCOUNTER — Other Ambulatory Visit: Payer: Self-pay

## 2022-06-26 ENCOUNTER — Emergency Department: Payer: Medicare Other

## 2022-06-26 ENCOUNTER — Emergency Department
Admission: EM | Admit: 2022-06-26 | Discharge: 2022-06-26 | Disposition: A | Discharge status: Home / Self Care | Payer: Medicare Other | Attending: Emergency Medicine | Admitting: Emergency Medicine

## 2022-06-26 ENCOUNTER — Encounter: Payer: Self-pay | Admitting: Pharmacy Technician

## 2022-06-26 DIAGNOSIS — J449 Chronic obstructive pulmonary disease, unspecified: Secondary | ICD-10-CM | POA: Diagnosis not present

## 2022-06-26 DIAGNOSIS — R0602 Shortness of breath: Secondary | ICD-10-CM | POA: Insufficient documentation

## 2022-06-26 DIAGNOSIS — R0609 Other forms of dyspnea: Secondary | ICD-10-CM | POA: Diagnosis not present

## 2022-06-26 DIAGNOSIS — E039 Hypothyroidism, unspecified: Secondary | ICD-10-CM | POA: Diagnosis not present

## 2022-06-26 DIAGNOSIS — I10 Essential (primary) hypertension: Secondary | ICD-10-CM | POA: Insufficient documentation

## 2022-06-26 LAB — CBC WITH DIFFERENTIAL/PLATELET
Abs Immature Granulocytes: 0.03 10*3/uL (ref 0.00–0.07)
Basophils Absolute: 0 10*3/uL (ref 0.0–0.1)
Basophils Relative: 0 %
Eosinophils Absolute: 0 10*3/uL (ref 0.0–0.5)
Eosinophils Relative: 0 %
HCT: 37.6 % (ref 36.0–46.0)
Hemoglobin: 12.1 g/dL (ref 12.0–15.0)
Immature Granulocytes: 0 %
Lymphocytes Relative: 6 %
Lymphs Abs: 0.6 10*3/uL — ABNORMAL LOW (ref 0.7–4.0)
MCH: 31.7 pg (ref 26.0–34.0)
MCHC: 32.2 g/dL (ref 30.0–36.0)
MCV: 98.4 fL (ref 80.0–100.0)
Monocytes Absolute: 0.5 10*3/uL (ref 0.1–1.0)
Monocytes Relative: 5 %
Neutro Abs: 8 10*3/uL — ABNORMAL HIGH (ref 1.7–7.7)
Neutrophils Relative %: 89 %
Platelets: 268 10*3/uL (ref 150–400)
RBC: 3.82 MIL/uL — ABNORMAL LOW (ref 3.87–5.11)
RDW: 13.4 % (ref 11.5–15.5)
WBC: 9.2 10*3/uL (ref 4.0–10.5)
nRBC: 0 % (ref 0.0–0.2)

## 2022-06-26 LAB — BASIC METABOLIC PANEL
Anion gap: 9 (ref 5–15)
BUN: 26 mg/dL — ABNORMAL HIGH (ref 8–23)
CO2: 32 mmol/L (ref 22–32)
Calcium: 9.5 mg/dL (ref 8.9–10.3)
Chloride: 89 mmol/L — ABNORMAL LOW (ref 98–111)
Creatinine, Ser: 0.63 mg/dL (ref 0.44–1.00)
GFR, Estimated: >60 mL/min (ref >60)
Glucose, Bld: 156 mg/dL — ABNORMAL HIGH (ref 70–99)
Potassium: 4.8 mmol/L (ref 3.5–5.1)
Sodium: 130 mmol/L — ABNORMAL LOW (ref 135–145)

## 2022-06-26 LAB — TROPONIN I (HIGH SENSITIVITY)
Troponin I (High Sensitivity): 9 ng/L (ref <18)
Troponin I (High Sensitivity): 9 ng/L (ref <18)

## 2022-06-26 MED ORDER — IPRATROPIUM-ALBUTEROL 0.5-2.5 (3) MG/3ML IN SOLN
3.0000 mL | Freq: Once | RESPIRATORY_TRACT | Status: AC
  Administered 2022-06-26: 3 mL via RESPIRATORY_TRACT
  Filled 2022-06-26: qty 3

## 2022-06-26 NOTE — ED Notes (Signed)
Patient states she needed to void. Patient placed on purwick

## 2022-06-26 NOTE — ED Triage Notes (Signed)
Pt [facility] for decreased oxygen saturations with exertion along with shob. +rhonchi bilaterally with pitting edema to BLE. Pt denies cardiac hx. States she was prescribed a fluid pill but stopped taking it. VSS with ems.

## 2022-06-26 NOTE — ED Notes (Signed)
Assumed care of patient at this time. Report received from [place], Therapist, sports.

## 2022-06-26 NOTE — ED Provider Notes (Signed)
[facility] Provider Note    Event Date/Time   First MD Initiated Contact with Patient 06/26/22 1820     (approximate)   History   Chief Complaint Shortness of Breath   HPI  Angela Mason is a 88 y.o. female with past medical history of hypertension, hypothyroidism, and COPD who presents to the ED complaining of shortness of breath.  Per EMS, patient had gotten out of breath with her physical therapy earlier today but oxygen levels were monitored and never dropped below 93% on room air.  Patient initially refused further evaluation in the ED, however her daughter in the [place] was contacted and notified of her symptoms.  Daughter then spoke with staff at her nursing facility, who then decided to call EMS.  Patient states that she has been having intermittent breathing difficulties for at least the past month, currently denies any difficulty breathing.  She denies any fevers, cough, or pain in her chest.  She reports chronic swelling in her lower extremities that is no worse than usual.  She denies any significant cardiac history.      Physical Exam   Triage Vital Signs: ED Triage Vitals [06/26/22 1827]  Enc Vitals Group     BP      Pulse      Resp      Temp      Temp src      SpO2      Weight      Height      Head Circumference      Peak Flow      Pain Score 0     Pain Loc      Pain Edu?      Excl. in [place]?     Most recent vital signs: Vitals:   06/26/22 2024 06/26/22 2100  BP: 126/68 133/61  Pulse: 74 72  Resp: (!) 28 (!) 23  Temp: 98.5 F (36.9 C)   SpO2: 92% 92%    Constitutional: Alert and oriented. Eyes: Conjunctivae are normal. Head: Atraumatic. Nose: No congestion/rhinnorhea. Mouth/Throat: Mucous membranes are moist.  Cardiovascular: Normal rate, regular rhythm. Grossly normal heart sounds.  2+ radial pulses bilaterally. Respiratory: Normal respiratory effort.  No retractions. Lungs CTAB. Gastrointestinal: Soft and nontender. No  distention. Musculoskeletal: No lower extremity tenderness nor edema.  Neurologic:  Normal speech and language. No gross focal neurologic deficits are appreciated.    ED Results / Procedures / Treatments   Labs (all labs ordered are listed, but only abnormal results are displayed) Labs Reviewed  CBC WITH DIFFERENTIAL/PLATELET - Abnormal; Notable for the following components:      Result Value   RBC 3.82 (*)    Neutro Abs 8.0 (*)    Lymphs Abs 0.6 (*)    All other components within normal limits  BASIC METABOLIC PANEL - Abnormal; Notable for the following components:   Sodium 130 (*)    Chloride 89 (*)    Glucose, Bld 156 (*)    BUN 26 (*)    All other components within normal limits  TROPONIN I (HIGH SENSITIVITY)  TROPONIN I (HIGH SENSITIVITY)     EKG  ED ECG REPORT I, Blake Divine, the attending physician, personally viewed and interpreted this ECG.   Date: 06/26/2022  EKG Time: 18:26  Rate: 77  Rhythm: normal sinus rhythm  Axis: Normal  Intervals:none  ST&T Change: LVH  RADIOLOGY Chest x-ray reviewed and interpreted by me with no infiltrate, edema, or effusion.  PROCEDURES:  Critical Care performed: No  Procedures   MEDICATIONS ORDERED IN ED: Medications  ipratropium-albuterol (DUONEB) 0.5-2.5 (3) MG/3ML nebulizer solution 3 mL (3 mLs Nebulization Given 06/26/22 1840)     IMPRESSION / MDM / ASSESSMENT AND PLAN / ED COURSE  I reviewed the triage vital signs and the nursing notes.                              88 y.o. female with past medical history of hypertension, hypothyroidism, and COPD who presents to the ED complaining of intermittent difficulty breathing over the past month, was noted to be short of breath with PT earlier today.  Patient's presentation is most consistent with acute presentation with potential threat to life or bodily function.  Differential diagnosis includes, but is not limited to, ACS, arrhythmia, CHF, COPD exacerbation,  PE, pneumonia.  Patient nontoxic-appearing and in no acute distress, vital signs remarkable for mild tachypnea but otherwise reassuring, patient currently maintaining oxygen saturations at 96% on room air.  EKG shows no tensive arrhythmia or ischemia and low suspicion for ACS at this time.  Will observe on cardiac monitor and check troponin, chest x-ray and additional labs are pending.  No obvious wheezing noted on exam but given her history and mild tachypnea, we will treat with DuoNeb.  Chest x-ray is unremarkable, 2 sets of troponin are negative and I doubt ACS or PE.  Additional labs are reassuring with no significant anemia, leukocytosis, electrode abnormality, or AKI.  On reassessment, patient reports no ongoing breathing difficulty, respiratory rate and oxygen saturations are reassuring.  She is appropriate for discharge back to her nursing facility with close PCP follow-up.  Patient counseled to return to the ED for new or worsening symptoms, patient agrees with plan.      FINAL CLINICAL IMPRESSION(S) / ED DIAGNOSES   Final diagnoses:  Shortness of breath  Dyspnea on exertion     Rx / DC Orders   ED Discharge Orders     None        Note:  This document was prepared using Dragon voice recognition software and may include unintentional dictation errors.   Blake Divine, MD 06/26/22 2157

## 2022-07-14 ENCOUNTER — Other Ambulatory Visit: Payer: Self-pay

## 2022-07-14 MED ORDER — ATENOLOL 25 MG PO TABS: 25.0000 mg | ORAL_TABLET | Freq: Every day | ORAL | 1 refills | Status: DC

## 2022-07-14 NOTE — Telephone Encounter (Signed)
Patient called for a refill on her Atenolol 25 mg and medication never filled by Dr. Dewayne Shorter.

## 2022-07-25 ENCOUNTER — Telehealth: Payer: Self-pay | Admitting: *Deleted

## 2022-07-25 ENCOUNTER — Ambulatory Visit: Payer: Medicare Other | Admitting: Student

## 2022-07-25 ENCOUNTER — Encounter: Payer: Self-pay | Admitting: Student

## 2022-07-25 VITALS — BP 140/70 | HR 65 | Temp 97.8°F | Resp 18 | Wt 85.0 lb

## 2022-07-25 DIAGNOSIS — I10 Essential (primary) hypertension: Secondary | ICD-10-CM | POA: Diagnosis not present

## 2022-07-25 DIAGNOSIS — J449 Chronic obstructive pulmonary disease, unspecified: Secondary | ICD-10-CM | POA: Diagnosis not present

## 2022-07-25 DIAGNOSIS — R636 Underweight: Secondary | ICD-10-CM

## 2022-07-25 DIAGNOSIS — F323 Major depressive disorder, single episode, severe with psychotic features: Secondary | ICD-10-CM

## 2022-07-25 MED ORDER — LOSARTAN POTASSIUM 25 MG PO TABS: 25.0000 mg | ORAL_TABLET | Freq: Every day | ORAL | 0 refills | Status: DC

## 2022-07-25 NOTE — Patient Instructions (Signed)
Stop taking Atenolol  Start taking Losartan 25 mg daily.   I will in 1 week. Please continue to check your blood pressure 1 hour after taking your medicine.   I would like for you to have labs collected Next Thursday morning  at 7:30 AM to check on your kidney function and sodium levels.

## 2022-07-25 NOTE — Telephone Encounter (Signed)
Dr. Unk Lightning requested labs to be drawn on 07/31/2022 and patient does not drive.  Called Blair Heys [phone] with [facility] and scheduled patient an appointment for Home Draw on 07/31/2022.  Requisition filled out and left in Clinic in the lab tray with Sticky note stated "Home Visit"

## 2022-07-25 NOTE — Progress Notes (Signed)
[facility] clinic  Provider: Unk Lightning  Code Status: full code Goals of Care:     06/16/2022    2:25 PM  Advanced Directives  Does Patient Have a Medical Advance Directive? Yes  Type of Paramedic of [place];Living will  Does patient want to make changes to medical advance directive? No - Patient declined  Copy of [place] in Chart? No - copy requested    Chief Complaint  Patient presents with   Acute Visit    Complains of Blood Pressure running high.    HPI: Patient is a 88 y.o. female seen today for an acute visit for follow up of blood pressure - she has had elevated BP with physical therapy. They are currently at [facility] on Mondays and Thursdays. They are hoping transitioning to Friday afternoon instead of Thursday she won't have as much anxiety. Her. She has been eating so much more. Eating whatever she wants. Eating more peanut butter. Zucchini Pie with eggs. She is drinking boost as well.   In physical blood pressure - 200/108 or 190/90  She denies chest pain, headaches,   Shortness of breath is mainly from being anxious. She wants her life to be according to plan and if it deviates she has issues with the changes.   Her sleep has been good. Sometimes she doesn't take the mirtazapine if she goes straight to sleep. About a month ago she was sleeping so well she had an episode of incontinence. She usually gets up every two hours and goes back to sleep really well. No issues with emptying her bladder.   She feels like she has a lot to do before moving to assisted living. She primarily says she would like for her daughter to keep   Past Medical History:  Diagnosis Date   Cancer ([place])    SKIN   COPD, mild (HCC)    Dyspnea    Edema    HLD (hyperlipidemia)    HTN (hypertension)    Hypothyroidism    Macular degeneration, bilateral    Varicosities     Past Surgical History:  Procedure Laterality Date   CATARACT EXTRACTION W/PHACO Right  02/03/2017   Procedure: CATARACT EXTRACTION PHACO AND INTRAOCULAR LENS PLACEMENT ([place]);  Surgeon: Birder Robson, MD;  Location: [facility] ORS;  Service: Ophthalmology;  Laterality: Right;  [place] 00:57.2 AP% 18.5 CDE 10.58 Fluid pack lot # [id number]   CATARACT EXTRACTION W/PHACO Left 02/24/2017   Procedure: CATARACT EXTRACTION PHACO AND INTRAOCULAR LENS PLACEMENT (IOC);  Surgeon: Birder Robson, MD;  Location: [facility] ORS;  Service: Ophthalmology;  Laterality: Left;  [place] 01:12 AP% 25.0 CDE 18.03 Fluid pack lot # [id number]   MOHS SURGERY  2015   nose   SALIVARY GLAND SURGERY Left 2012   TONSILLECTOMY  1940    Allergies  Allergen Reactions   Gentamicin    Levofloxacin Diarrhea    Outpatient Encounter Medications as of 07/25/2022  Medication Sig   ARIPiprazole (ABILIFY) 2 MG tablet Take 1 tablet (2 mg total) by mouth daily with breakfast.   calcium-vitamin D (OSCAL WITH D) 500-200 MG-UNIT tablet Take 2 tablets by mouth daily.   Ferrous Sulfate Dried (HIGH POTENCY IRON) 65 MG TABS Take by mouth.   losartan (COZAAR) 25 MG tablet Take 1 tablet (25 mg total) by mouth daily.   lovastatin (MEVACOR) 20 MG tablet Take 20 mg by mouth at bedtime.   mirtazapine (REMERON) 7.5 MG tablet Take 1 tablet (7.5 mg total) by mouth  at bedtime.   Multiple Vitamins-Minerals (PRESERVISION AREDS) TABS Take 1 capsule by mouth 2 (two) times daily.   Omega-3 Fatty Acids (FISH OIL CONCENTRATE PO) Take 1 tablet by mouth daily.    OXYQUINOLONE SULFATE VAGINAL 0.025 % GEL Place 1 application vaginally 2 (two) times a week.   Polyvinyl Alcohol-Povidone PF (REFRESH) 1.4-0.6 % SOLN Place 1 drop into both eyes in the morning and at bedtime.   [DISCONTINUED] atenolol (TENORMIN) 25 MG tablet Take 1 tablet (25 mg total) by mouth daily.   [DISCONTINUED] ARIPiprazole (ABILIFY) 2 MG tablet Take 1 tablet (2 mg total) by mouth daily.   Facility-Administered Encounter Medications as of 07/25/2022  Medication   estradiol (ESTRACE)  vaginal cream 1 Applicatorful    Review of Systems:  Review of Systems  Health Maintenance  Topic Date Due   Medicare Annual Wellness (AWV)  Never done   COVID-19 Vaccine (1) Never done   DTaP/Tdap/Td (1 - Tdap) Never done   Zoster Vaccines- Shingrix (1 of 2) Never done   DEXA SCAN  Never done   Pneumonia Vaccine 55+ Years old (2 - PPSV23 or PCV20) 08/09/2019   INFLUENZA VACCINE  Completed   HPV VACCINES  Aged Out    Physical Exam: Vitals:   07/25/22 1420  BP: (!) 140/70  Pulse: 65  Resp: 18  Temp: 97.8 F (36.6 C)  SpO2: 96%  Weight: 85 lb (38.6 kg)   Body mass index is 17.77 kg/m. Physical Exam  Labs reviewed: Basic Metabolic Panel: Recent Labs    01/27/22 1732 05/01/22 0000 06/26/22 1828  NA 134* 128* 130*  K 4.2 4.1 4.8  CL 97* 89* 89*  CO2 31 33* 32  GLUCOSE 106*  --  156*  BUN 31* 10 26*  CREATININE 0.81 0.5 0.63  CALCIUM 9.0 9.3 9.5   Liver Function Tests: Recent Labs    01/27/22 1732 05/01/22 0000  AST 26 17  ALT 25 11  ALKPHOS 65 84  BILITOT 0.7  --   PROT 6.8  --   ALBUMIN 3.8 4.1   No results for input(s): "LIPASE", "AMYLASE" in the last 8760 hours. No results for input(s): "AMMONIA" in the last 8760 hours. CBC: Recent Labs    01/27/22 1732 01/29/22 0534 06/26/22 1828  WBC 20.5* 12.4* 9.2  NEUTROABS 18.5*  --  8.0*  HGB 12.9 10.8* 12.1  HCT 39.7 33.8* 37.6  MCV 98.3 100.0 98.4  PLT 217 164 268   Lipid Panel: Recent Labs    05/01/22 0000  CHOL 175  HDL 64  LDLCALC 91  TRIG 103   Lab Results  Component Value Date   HGBA1C 13.0 05/01/2022    Procedures since last visit: DG Chest 2 View  Result Date: 06/26/2022 CLINICAL DATA:  Shortness of breath and dyspnea on exertion. EXAM: CHEST - 2 VIEW COMPARISON:  Chest x-ray dated January 27, 2022. FINDINGS: The heart size and mediastinal contours are within normal limits. Normal pulmonary vascularity. The lungs remain hyperinflated with chronically coarsened interstitial  markings. No focal consolidation, pleural effusion, or pneumothorax. No acute osseous abnormality. IMPRESSION: 1. No acute cardiopulmonary disease. 2. COPD. Electronically Signed   By: Titus Dubin M.D.   On: 06/26/2022 19:32    Assessment/Plan 1. Primary hypertension BP has been sporadic with PT and anxiety. Plan to discontinue atenolol, start losartan 25 mg daily. Labs in 1 week, follow up in 2 weeks with BP log.   2. Underweight 3. Severe major depression, single episode, with  psychotic features ([place]) 4. Chronic obstructive pulmonary disease, unspecified COPD type ([place]) Patient has considered transition to AL due to need for support for showering and with meals. Plan to schedule visits to AL to tour and have a meal. Discussed possible transition in the next 3 months if she is able to have her personal items sorted. Discussed concerns for medication management, weight management as reasons for needing additional care support.     Labs/tests ordered:  * No order type specified * Next appt:  08/18/2022

## 2022-07-30 ENCOUNTER — Ambulatory Visit: Payer: Medicare Other | Admitting: Student

## 2022-07-31 LAB — BASIC METABOLIC PANEL
BUN: 20 (ref 4–21)
CO2: 34 — AB (ref 13–22)
Chloride: 96 — AB (ref 99–108)
Creatinine: 0.6 (ref 0.5–1.1)
Glucose: 92
Potassium: 4.6 mEq/L (ref 3.5–5.1)
Sodium: 136 — AB (ref 137–147)

## 2022-07-31 LAB — COMPREHENSIVE METABOLIC PANEL: eGFR: 86

## 2022-08-01 ENCOUNTER — Encounter: Payer: Self-pay | Admitting: Student

## 2022-08-05 ENCOUNTER — Encounter: Payer: Self-pay | Admitting: Student

## 2022-08-06 ENCOUNTER — Encounter: Payer: Self-pay | Admitting: Student

## 2022-08-06 ENCOUNTER — Ambulatory Visit: Payer: Medicare Other | Admitting: Student

## 2022-08-06 VITALS — BP 146/84 | HR 76 | Temp 97.9°F | Ht <= 58 in | Wt 85.0 lb

## 2022-08-06 DIAGNOSIS — E871 Hypo-osmolality and hyponatremia: Secondary | ICD-10-CM | POA: Diagnosis not present

## 2022-08-06 DIAGNOSIS — K5901 Slow transit constipation: Secondary | ICD-10-CM

## 2022-08-06 DIAGNOSIS — R600 Localized edema: Secondary | ICD-10-CM | POA: Diagnosis not present

## 2022-08-06 DIAGNOSIS — I10 Essential (primary) hypertension: Secondary | ICD-10-CM | POA: Diagnosis not present

## 2022-08-06 NOTE — Patient Instructions (Addendum)
If you have constipatoin, Take 1 capful of Miralax (17g)  every day to help soften your stools.   Drink 6-8 cups of 8 ounces glasses of water each day. (Breakfast, Lunch, Dinner and 3additional glasses per day).   If Increasing water and miralax do not work, try Senokot 2 tablets nightly as needed.   Please continue checking your blood pressure 2-3x per week

## 2022-08-06 NOTE — Progress Notes (Signed)
Location:   [facility]   Place of Service:   [facility] [place] [facility]  Provider: Unk Lightning  Code Status: Full Code Goals of Care:     08/06/2022    2:23 PM  Advanced Directives  Does Patient Have a Medical Advance Directive? Yes  Type of Paramedic of [place];Living will  Does patient want to make changes to medical advance directive? No - Patient declined  Copy of [place] in Chart? No - copy requested     Chief Complaint  Patient presents with   Medical Management of Chronic Issues    Medical Management of Chronic Issues. Follow up blood pressure and review labwork.     HPI: Patient is a 89 y.o. female seen today for medical management of chronic diseases.    Patient is here today to follow up blood pressure and review blood work recently collected.   She has been checking her blood pressure at home averaging 140/75. She started the medication about 1 week ago.   She has had some issues with constipation. She takes 1/2 capful of miralax for constipation.   Past Medical History:  Diagnosis Date   Cancer ([place])    SKIN   COPD, mild (HCC)    Dyspnea    Edema    HLD (hyperlipidemia)    HTN (hypertension)    Hypothyroidism    Macular degeneration, bilateral    Varicosities     Past Surgical History:  Procedure Laterality Date   CATARACT EXTRACTION W/PHACO Right 02/03/2017   Procedure: CATARACT EXTRACTION PHACO AND INTRAOCULAR LENS PLACEMENT ([place]);  Surgeon: Birder Robson, MD;  Location: [facility] ORS;  Service: Ophthalmology;  Laterality: Right;  [place] 00:57.2 AP% 18.5 CDE 10.58 Fluid pack lot # [id number]   CATARACT EXTRACTION W/PHACO Left 02/24/2017   Procedure: CATARACT EXTRACTION PHACO AND INTRAOCULAR LENS PLACEMENT (IOC);  Surgeon: Birder Robson, MD;  Location: [facility] ORS;  Service: Ophthalmology;  Laterality: Left;  [place] 01:12 AP% 25.0 CDE 18.03 Fluid pack lot # [id number]   MOHS SURGERY  2015   nose   SALIVARY GLAND SURGERY  Left 2012   TONSILLECTOMY  1940    Allergies  Allergen Reactions   Gentamicin    Levofloxacin Diarrhea    Outpatient Encounter Medications as of 08/06/2022  Medication Sig   ARIPiprazole (ABILIFY) 2 MG tablet Take 1 tablet (2 mg total) by mouth daily with breakfast.   calcium-vitamin D (OSCAL WITH D) 500-200 MG-UNIT tablet Take 2 tablets by mouth daily.   Ferrous Sulfate Dried (HIGH POTENCY IRON) 65 MG TABS Take by mouth.   losartan (COZAAR) 25 MG tablet Take 1 tablet (25 mg total) by mouth daily.   lovastatin (MEVACOR) 20 MG tablet Take 20 mg by mouth at bedtime.   mirtazapine (REMERON) 7.5 MG tablet Take 1 tablet (7.5 mg total) by mouth at bedtime.   Multiple Vitamins-Minerals (PRESERVISION AREDS) TABS Take 1 capsule by mouth 2 (two) times daily.   Omega-3 Fatty Acids (FISH OIL CONCENTRATE PO) Take 1 tablet by mouth daily.    OXYQUINOLONE SULFATE VAGINAL 0.025 % GEL Place 1 application vaginally 2 (two) times a week.   Polyvinyl Alcohol-Povidone PF (REFRESH) 1.4-0.6 % SOLN Place 1 drop into both eyes in the morning and at bedtime.   Facility-Administered Encounter Medications as of 08/06/2022  Medication   estradiol (ESTRACE) vaginal cream 1 Applicatorful    Review of Systems:  Review of Systems  All other systems reviewed and are negative.  Health Maintenance  Topic Date Due   Medicare Annual Wellness (AWV)  Never done   COVID-19 Vaccine (1) Never done   DTaP/Tdap/Td (1 - Tdap) Never done   Zoster Vaccines- Shingrix (1 of 2) Never done   DEXA SCAN  Never done   Pneumonia Vaccine 63+ Years old (2 - PPSV23 or PCV20) 08/09/2019   INFLUENZA VACCINE  Completed   HPV VACCINES  Aged Out    Physical Exam: Vitals:   08/06/22 1421  BP: (!) 146/84  Pulse: 76  Temp: 97.9 F (36.6 C)  SpO2: 96%  Weight: 85 lb (38.6 kg)  Height: '4\' 10"'$  (1.473 m)   Body mass index is 17.77 kg/m. Physical Exam Vitals reviewed.  Constitutional:      Appearance: Normal appearance.   Cardiovascular:     Rate and Rhythm: Normal rate and regular rhythm.     Pulses: Normal pulses.     Heart sounds: Normal heart sounds.  Pulmonary:     Comments: Bilateral faint end expiratory wheeze Abdominal:     General: Abdomen is flat.     Palpations: Abdomen is soft.  Musculoskeletal:        General: No swelling.     Comments: Compression stockings in place  Skin:    General: Skin is warm and dry.  Neurological:     Mental Status: She is alert and oriented to person, place, and time.  Psychiatric:        Mood and Affect: Mood normal.        Behavior: Behavior normal.     Labs reviewed: Basic Metabolic Panel: Recent Labs    01/27/22 1732 05/01/22 0000 06/26/22 1828 07/31/22 0000  NA 134* 128* 130* 136*  K 4.2 4.1 4.8 4.6  CL 97* 89* 89* 96*  CO2 31 33* 32 34*  GLUCOSE 106*  --  156*  --   BUN 31* 10 26* 20  CREATININE 0.81 0.5 0.63 0.6  CALCIUM 9.0 9.3 9.5  --    Liver Function Tests: Recent Labs    01/27/22 1732 05/01/22 0000  AST 26 17  ALT 25 11  ALKPHOS 65 84  BILITOT 0.7  --   PROT 6.8  --   ALBUMIN 3.8 4.1   No results for input(s): "LIPASE", "AMYLASE" in the last 8760 hours. No results for input(s): "AMMONIA" in the last 8760 hours. CBC: Recent Labs    01/27/22 1732 01/29/22 0534 06/26/22 1828  WBC 20.5* 12.4* 9.2  NEUTROABS 18.5*  --  8.0*  HGB 12.9 10.8* 12.1  HCT 39.7 33.8* 37.6  MCV 98.3 100.0 98.4  PLT 217 164 268   Lipid Panel: Recent Labs    05/01/22 0000  CHOL 175  HDL 64  LDLCALC 91  TRIG 103   Lab Results  Component Value Date   HGBA1C 13.0 05/01/2022    Procedures since last visit: No results found.  Assessment/Plan 1. Primary hypertension Patient seen for follow up of hypertension. BP improving with initiation of therapy. Will continue losartan 25 mg at this time. F/u in 1 month to determine need of increasing medication. Kidney function normal with Cr 0.62 an   2. Constipation by delayed colonic  transit Patient states her stool is [place] 1-3 all of the time. Discussed concern for inadequate treatment of constipation. Will increase miralax to 1 capful per day. Discussed concern for poor hydration. If no improvement with increase in water and miralax, will plan to start senokot 2 tablets nightly.  3. Pedal edema Improving with compression and elevation  4. Hyponatremia Sodium today 136. Continue current diet.   Labs/tests ordered:  * No order type specified * Next appt:  2/16 @ 2:40 PM

## 2022-08-15 ENCOUNTER — Telehealth: Payer: Self-pay

## 2022-08-15 NOTE — Telephone Encounter (Signed)
Refill request received from [facility] for HCTZ '25mg'$ . Medication is not on active medication list.  Message routed to Dr. Dewayne Shorter

## 2022-08-15 NOTE — Telephone Encounter (Signed)
Transition Care Management Follow-up Telephone Call Date of discharge and from where: [facility] 08/14/2022 How have you been since you were released from the hospital? okay Any questions or concerns? No  Items Reviewed: Did the pt receive and understand the discharge instructions provided? Yes  Medications obtained and verified? Yes  Other? No  Any new allergies since your discharge? No  Dietary orders reviewed? Yes Do you have support at home? Yes   Home Care and Equipment/Supplies: Were home health services ordered? no If so, what is the name of the agency? N/a  Has the agency set up a time to come to the patient's home? not applicable Were any new equipment or medical supplies ordered?  No What is the name of the medical supply agency? N/a Were you able to get the supplies/equipment? no Do you have any questions related to the use of the equipment or supplies? No  Functional Questionnaire: (I = Independent and D = Dependent) ADLs: I  Bathing/Dressing- I  Meal Prep- I  Eating- I  Maintaining continence- I  Transferring/Ambulation- I  Managing Meds- I  Follow up appointments reviewed:  PCP Hospital f/u appt confirmed? No  Patient declined appt [facility] f/u appt confirmed? No  Are transportation arrangements needed? No  If their condition worsens, is the pt aware to call PCP or go to the Emergency Dept.? Yes Was the patient provided with contact information for the PCP's office or ED? Yes Was to pt encouraged to call back with questions or concerns? Yes  Juanda Crumble, LPN [place] Direct Dial [phone]

## 2022-08-18 ENCOUNTER — Ambulatory Visit: Payer: BLUE CROSS/BLUE SHIELD | Admitting: Student

## 2022-09-05 ENCOUNTER — Encounter: Payer: Self-pay | Admitting: Student

## 2022-09-05 ENCOUNTER — Ambulatory Visit: Payer: Medicare Other | Admitting: Student

## 2022-09-05 VITALS — BP 118/64 | HR 65 | Temp 97.8°F | Ht <= 58 in | Wt 87.0 lb

## 2022-09-05 DIAGNOSIS — F22 Delusional disorders: Secondary | ICD-10-CM | POA: Diagnosis not present

## 2022-09-05 DIAGNOSIS — R443 Hallucinations, unspecified: Secondary | ICD-10-CM

## 2022-09-05 DIAGNOSIS — R300 Dysuria: Secondary | ICD-10-CM | POA: Diagnosis not present

## 2022-09-05 MED ORDER — SULFAMETHOXAZOLE-TRIMETHOPRIM 400-80 MG PO TABS: 1.0000 | ORAL_TABLET | Freq: Two times a day (BID) | ORAL | 0 refills | Status: AC

## 2022-09-05 NOTE — Progress Notes (Signed)
[facility] clinic [facility]  Provider: Dr. Dewayne Shorter  Code Status: Full  Goals of Care:     09/05/2022    2:24 PM  Advanced Directives  Does Patient Have a Medical Advance Directive? Yes  Type of Paramedic of [place];Living will  Does patient want to make changes to medical advance directive? No - Patient declined  Copy of [place] in Chart? No - copy requested     Chief Complaint  Patient presents with  . Acute Visit    Increased Confusion. Hallucinations.     HPI: Patient is a 89 y.o. female seen today for an acute visit for  She has not had any pain with urination. She has been urinating more often. Her daughter has ordered a heavier pad for her which has helped.   She denies has not had nausea.  She was looking for change to give her friend. She says the friend never brought the change over. For some reason she has found that the change she needed she had all along. She isn't sure if the loose money was there before or not. She felt like someone was up in the attic. She has been taking the Abilify but not the mirtazapine.   She hasn't slept thoroughly because she has been waking up more often. She is up every 3 hours.   Past Medical History:  Diagnosis Date  . Cancer ([place])    SKIN  . COPD, mild ([place])   . Dyspnea   . Edema   . HLD (hyperlipidemia)   . HTN (hypertension)   . Hypothyroidism   . Macular degeneration, bilateral   . Varicosities     Past Surgical History:  Procedure Laterality Date  . CATARACT EXTRACTION W/PHACO Right 02/03/2017   Procedure: CATARACT EXTRACTION PHACO AND INTRAOCULAR LENS PLACEMENT (IOC);  Surgeon: Birder Robson, MD;  Location: [facility] ORS;  Service: Ophthalmology;  Laterality: Right;  [place] 00:57.2 AP% 18.5 CDE 10.58 Fluid pack lot # [id number]  . CATARACT EXTRACTION W/PHACO Left 02/24/2017   Procedure: CATARACT EXTRACTION PHACO AND INTRAOCULAR LENS PLACEMENT (IOC);  Surgeon: Birder Robson, MD;  Location: [facility] ORS;  Service: Ophthalmology;  Laterality: Left;  [place] 01:12 AP% 25.0 CDE 18.03 Fluid pack lot # [id number]  . MOHS SURGERY  2015   nose  . SALIVARY GLAND SURGERY Left 2012  . TONSILLECTOMY  1940    Allergies  Allergen Reactions  . Gentamicin   . Levofloxacin Diarrhea    Outpatient Encounter Medications as of 09/05/2022  Medication Sig  . ARIPiprazole (ABILIFY) 2 MG tablet Take 1 tablet (2 mg total) by mouth daily with breakfast.  . calcium-vitamin D (OSCAL WITH D) 500-200 MG-UNIT tablet Take 2 tablets by mouth daily.  Marland Kitchen losartan (COZAAR) 25 MG tablet Take 1 tablet (25 mg total) by mouth daily.  Marland Kitchen lovastatin (MEVACOR) 20 MG tablet Take 20 mg by mouth at bedtime.  . mirtazapine (REMERON) 7.5 MG tablet Take 1 tablet (7.5 mg total) by mouth at bedtime.  . Multiple Vitamins-Minerals (PRESERVISION AREDS) TABS Take 1 capsule by mouth 2 (two) times daily.  . Omega-3 Fatty Acids (FISH OIL CONCENTRATE PO) Take 1 tablet by mouth daily.   Levin Erp SULFATE VAGINAL 0.025 % GEL Place 1 application vaginally 2 (two) times a week.  . Polyvinyl Alcohol-Povidone PF (REFRESH) 1.4-0.6 % SOLN Place 1 drop into both eyes in the morning and at bedtime.  . [DISCONTINUED] Ferrous Sulfate Dried (HIGH POTENCY IRON) 65  MG TABS Take by mouth.   Facility-Administered Encounter Medications as of 09/05/2022  Medication  . estradiol (ESTRACE) vaginal cream 1 Applicatorful    Review of Systems:  Review of Systems  All other systems reviewed and are negative.   Health Maintenance  Topic Date Due  . Medicare Annual Wellness (AWV)  Never done  . DTaP/Tdap/Td (1 - Tdap) Never done  . Zoster Vaccines- Shingrix (1 of 2) Never done  . DEXA SCAN  Never done  . Pneumonia Vaccine 28+ Years old (2 of 2 - PPSV23 or PCV20) 08/09/2019  . COVID-19 Vaccine (1) 06/12/2020  . INFLUENZA VACCINE  Completed  . HPV VACCINES  Aged Out    Physical Exam: Vitals:   09/05/22 1421  BP: 118/64   Pulse: 65  Temp: 97.8 F (36.6 C)  SpO2: 93%  Weight: 87 lb (39.5 kg)  Height: 4' 10"$  (1.473 m)   Body mass index is 18.18 kg/m. Physical Exam Vitals reviewed.  Constitutional:      Appearance: Normal appearance.  Cardiovascular:     Pulses: Normal pulses.  Pulmonary:     Effort: Pulmonary effort is normal.  Abdominal:     Comments: Distended bladder, nontender.   Neurological:     Mental Status: She is alert and oriented to person, place, and time.    Labs reviewed: Basic Metabolic Panel: Recent Labs    01/27/22 1732 05/01/22 0000 06/26/22 1828 07/31/22 0000  NA 134* 128* 130* 136*  K 4.2 4.1 4.8 4.6  CL 97* 89* 89* 96*  CO2 31 33* 32 34*  GLUCOSE 106*  --  156*  --   BUN 31* 10 26* 20  CREATININE 0.81 0.5 0.63 0.6  CALCIUM 9.0 9.3 9.5  --    Liver Function Tests: Recent Labs    01/27/22 1732 05/01/22 0000  AST 26 17  ALT 25 11  ALKPHOS 65 84  BILITOT 0.7  --   PROT 6.8  --   ALBUMIN 3.8 4.1   No results for input(s): "LIPASE", "AMYLASE" in the last 8760 hours. No results for input(s): "AMMONIA" in the last 8760 hours. CBC: Recent Labs    01/27/22 1732 01/29/22 0534 06/26/22 1828  WBC 20.5* 12.4* 9.2  NEUTROABS 18.5*  --  8.0*  HGB 12.9 10.8* 12.1  HCT 39.7 33.8* 37.6  MCV 98.3 100.0 98.4  PLT 217 164 268   Lipid Panel: Recent Labs    05/01/22 0000  CHOL 175  HDL 64  LDLCALC 91  TRIG 103   Lab Results  Component Value Date   HGBA1C 13.0 05/01/2022    Procedures since last visit: No results found.  Assessment/Plan Dysuria - Plan: sulfamethoxazole-trimethoprim (BACTRIM) 400-80 MG tablet  Delusions ([place])    Labs/tests ordered:  * No order type specified * Next appt:  09/12/2022

## 2022-09-05 NOTE — Patient Instructions (Signed)
Please take Bactrim in the morning and at night for the next 5 days.   If your urine comes back negative, we can increase your other medication, Abilify to 5 mg daily.   Nice to see you!

## 2022-09-10 ENCOUNTER — Telehealth: Payer: Self-pay | Admitting: *Deleted

## 2022-09-10 NOTE — Telephone Encounter (Signed)
Patient called the [facility] and stated that she has been taking her Bactrim twice daily but her medicine bottle states she is suppose to be taking only Once daily.   sulfamethoxazole-trimethoprim (BACTRIM) 400-80 MG tablet Take 1 tablet by mouth 2 (two) times daily for 5 days.    I confirmed with patient that she is suppose to be taking it Twice daily, patient agreed.     Confirmed Appointment on Friday with Dr. Unk Lightning. Patient confirmed.

## 2022-09-12 ENCOUNTER — Encounter: Payer: Self-pay | Admitting: Student

## 2022-09-12 ENCOUNTER — Ambulatory Visit: Payer: Medicare Other | Admitting: Student

## 2022-09-12 VITALS — BP 136/84 | HR 74 | Temp 98.1°F | Ht <= 58 in | Wt 87.0 lb

## 2022-09-12 DIAGNOSIS — F23 Brief psychotic disorder: Secondary | ICD-10-CM | POA: Insufficient documentation

## 2022-09-12 DIAGNOSIS — R350 Frequency of micturition: Secondary | ICD-10-CM

## 2022-09-12 DIAGNOSIS — H353231 Exudative age-related macular degeneration, bilateral, with active choroidal neovascularization: Secondary | ICD-10-CM | POA: Diagnosis not present

## 2022-09-12 DIAGNOSIS — I10 Essential (primary) hypertension: Secondary | ICD-10-CM

## 2022-09-12 DIAGNOSIS — E43 Unspecified severe protein-calorie malnutrition: Secondary | ICD-10-CM

## 2022-09-12 DIAGNOSIS — J449 Chronic obstructive pulmonary disease, unspecified: Secondary | ICD-10-CM

## 2022-09-12 MED ORDER — LOSARTAN POTASSIUM 50 MG PO TABS: 50.0000 mg | ORAL_TABLET | Freq: Every day | ORAL | 3 refills | Status: DC

## 2022-09-12 NOTE — Progress Notes (Signed)
Location:  [facility] clinic [facility].   Provider: Dr. Dewayne Shorter  Code Status: Full Code Goals of Care:     09/12/2022    2:43 PM  Advanced Directives  Does Patient Have a Medical Advance Directive? Yes  Type of Paramedic of [place];Living will  Does patient want to make changes to medical advance directive? No - Patient declined  Copy of [place] in Chart? No - copy requested     Chief Complaint  Patient presents with   Medical Management of Chronic Issues    Medical Management of Chronic Issues. Follow up Blood Pressure and wants to discuss Urine Culture.    Quality Metric Gaps    To discuss need for AWV,Tdap,Zoster and Covid or postpone if patient refuses.     HPI: Patient is a 89 y.o. female seen today for medical management of chronic diseases.    She is here to follow up BP. Improved on current medication changes, but still more half of them >150/90  She is not emptying her bladder well. She is not soaking her pads as much at night now. She doesn't keep track of how much water she is during. She continues to go through a number of pads during the day. She changes her pad more often. She has some blood  She takes prednisone 5 days before going to see the gynecology. She goes in 5d.   Self discontinued mirtazapine because she doesn't get up to use the bathroom. She hasn't been sleeping through the night with this medication.  Past Medical History:  Diagnosis Date   Cancer ([place])    SKIN   COPD, mild (HCC)    Dyspnea    Edema    HLD (hyperlipidemia)    HTN (hypertension)    Hypothyroidism    Macular degeneration, bilateral    Varicosities     Past Surgical History:  Procedure Laterality Date   CATARACT EXTRACTION W/PHACO Right 02/03/2017   Procedure: CATARACT EXTRACTION PHACO AND INTRAOCULAR LENS PLACEMENT ([place]);  Surgeon: Birder Robson, MD;  Location: [facility] ORS;  Service: Ophthalmology;  Laterality: Right;  [place]  00:57.2 AP% 18.5 CDE 10.58 Fluid pack lot # [id number]   CATARACT EXTRACTION W/PHACO Left 02/24/2017   Procedure: CATARACT EXTRACTION PHACO AND INTRAOCULAR LENS PLACEMENT (IOC);  Surgeon: Birder Robson, MD;  Location: [facility] ORS;  Service: Ophthalmology;  Laterality: Left;  [place] 01:12 AP% 25.0 CDE 18.03 Fluid pack lot # [id number]   MOHS SURGERY  2015   nose   SALIVARY GLAND SURGERY Left 2012   TONSILLECTOMY  1940    Allergies  Allergen Reactions   Gentamicin    Levofloxacin Diarrhea    Outpatient Encounter Medications as of 09/12/2022  Medication Sig   ARIPiprazole (ABILIFY) 2 MG tablet Take 1 tablet (2 mg total) by mouth daily with breakfast.   calcium-vitamin D (OSCAL WITH D) 500-200 MG-UNIT tablet Take 2 tablets by mouth daily.   losartan (COZAAR) 50 MG tablet Take 1 tablet (50 mg total) by mouth daily.   lovastatin (MEVACOR) 20 MG tablet Take 20 mg by mouth at bedtime.   Multiple Vitamins-Minerals (PRESERVISION AREDS) TABS Take 1 capsule by mouth 2 (two) times daily.   Omega-3 Fatty Acids (FISH OIL CONCENTRATE PO) Take 1 tablet by mouth daily.    OXYQUINOLONE SULFATE VAGINAL 0.025 % GEL Place 1 application vaginally 2 (two) times a week.   Polyvinyl Alcohol-Povidone PF (REFRESH) 1.4-0.6 % SOLN Place 1 drop into both eyes  in the morning and at bedtime.   [DISCONTINUED] losartan (COZAAR) 25 MG tablet Take 1 tablet (25 mg total) by mouth daily.   [DISCONTINUED] mirtazapine (REMERON) 7.5 MG tablet Take 1 tablet (7.5 mg total) by mouth at bedtime.   Facility-Administered Encounter Medications as of 09/12/2022  Medication   estradiol (ESTRACE) vaginal cream 1 Applicatorful    Review of Systems:  Review of Systems  Health Maintenance  Topic Date Due   Medicare Annual Wellness (AWV)  Never done   DTaP/Tdap/Td (1 - Tdap) Never done   Zoster Vaccines- Shingrix (1 of 2) Never done   DEXA SCAN  Never done   COVID-19 Vaccine (1) 06/12/2020   Pneumonia Vaccine 80+ Years old   Completed   INFLUENZA VACCINE  Completed   HPV VACCINES  Aged Out    Physical Exam: Vitals:   09/12/22 1440  BP: 136/84  Pulse: 74  Temp: 98.1 F (36.7 C)  SpO2: 97%  Weight: 87 lb (39.5 kg)  Height: 4' 10"$  (1.473 m)   Body mass index is 18.18 kg/m. Physical Exam Vitals reviewed.  Constitutional:      Appearance: Normal appearance.  Cardiovascular:     Pulses: Normal pulses.  Pulmonary:     Effort: Pulmonary effort is normal.  Abdominal:     General: Abdomen is flat.  Neurological:     Mental Status: She is alert and oriented to person, place, and time.     Labs reviewed: Basic Metabolic Panel: Recent Labs    01/27/22 1732 05/01/22 0000 06/26/22 1828 07/31/22 0000  NA 134* 128* 130* 136*  K 4.2 4.1 4.8 4.6  CL 97* 89* 89* 96*  CO2 31 33* 32 34*  GLUCOSE 106*  --  156*  --   BUN 31* 10 26* 20  CREATININE 0.81 0.5 0.63 0.6  CALCIUM 9.0 9.3 9.5  --    Liver Function Tests: Recent Labs    01/27/22 1732 05/01/22 0000  AST 26 17  ALT 25 11  ALKPHOS 65 84  BILITOT 0.7  --   PROT 6.8  --   ALBUMIN 3.8 4.1   No results for input(s): "LIPASE", "AMYLASE" in the last 8760 hours. No results for input(s): "AMMONIA" in the last 8760 hours. CBC: Recent Labs    01/27/22 1732 01/29/22 0534 06/26/22 1828  WBC 20.5* 12.4* 9.2  NEUTROABS 18.5*  --  8.0*  HGB 12.9 10.8* 12.1  HCT 39.7 33.8* 37.6  MCV 98.3 100.0 98.4  PLT 217 164 268   Lipid Panel: Recent Labs    05/01/22 0000  CHOL 175  HDL 64  LDLCALC 91  TRIG 103   No results found for: "HGBA1C"   Procedures since last visit: No results found.  Assessment/Plan Acute paranoid reaction (HCC)  Exudative age-related macular degeneration of both eyes with active choroidal neovascularization ([place]), Chronic  Protein-calorie malnutrition, severe ([place]), Chronic  Chronic obstructive pulmonary disease, unspecified COPD type ([place]), Chronic  Primary hypertension - Plan: losartan (COZAAR) 50 MG  tablet BP improved on current dosing. Paranoia improved with 5 d course of antibiotics. BP still slightly above goal based on home BP checks. Will plan to increase dose of losartan. Call patient in 2 weeks to review home Bps. Weight Stable. Bladder training recommendations provided. F/u with GYN in 5 days. No symptoms with COPD, CTM. F/u 2 mo  Labs/tests ordered:  * No order type specified * Next appt:  Visit date not found

## 2022-09-12 NOTE — Patient Instructions (Addendum)
We will call you in 2 weeks to check on how your blood pressures. Please continue to check your blood pressure. We will call your home phone number.  Eating and drinking Follow instructions from your health care provider about eating or drinking restrictions. You may be told to: Avoid caffeine. Avoid drinking in the evening. Avoid foods or drinks that may irritate the bladder. These include coffee, tea, soda, artificial sweeteners, citrus, tomato-based foods, and chocolate. Eat foods that help prevent or treat constipation. Constipation can make urinary frequency worse. You may need to take these actions to prevent or treat constipation: Drink enough fluid to keep your urine pale yellow. Take over-the-counter or prescription medicines. Eat foods that are high in fiber, such as beans, whole grains, and fresh fruits and vegetables. Limit foods that are high in fat and processed sugars, such as fried or sweet foods.

## 2022-09-18 NOTE — Progress Notes (Unsigned)
    GYNECOLOGY PROGRESS NOTE  Subjective:    [name], female    DOB: 1933-03-11, 89 y.o.   MRN: [id number]  HPI  Patient is a 89 y.o. G38P1001 female who presents for Pessary Check. She has a history of cystocele, rectocele, complete procidentia, and incomplete bladder emptying. Also with significant vaginal atrophy. Notes some occasional leaking. She has been using the [place] gel only, stopped using the Premarin due to concerns for risk of cancer, but will use daily for 1 week prior to pessary check for ease of removal .  She continues to have vaginal bleeding. It is gradually worsening and she is unsure of were it is coming from. She denies having pain.    The following portions of the patient's history were reviewed and updated as appropriate: allergies, current medications, past family history, past medical history, past social history, past surgical history, and problem list.  Review of Systems Pertinent items are noted in HPI.   Objective:   There were no vitals taken for this visit. There is no height or weight on file to calculate BMI. General appearance: alert, cooperative, and no distress Abdomen: {abdominal exam:16834} Pelvic: {pelvic exam:16852::"cervix normal in appearance","external genitalia normal","no adnexal masses or tenderness","no cervical motion tenderness","rectovaginal septum normal","uterus normal size, shape, and consistency","vagina normal without discharge"} Extremities: {extremity exam:5109} Neurologic: {neuro exam:17854}   Assessment:   1. Pessary maintenance   2. Vaginal atrophy   3. 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    Rubie Maid, MD [place]

## 2022-09-19 ENCOUNTER — Encounter: Payer: Self-pay | Admitting: Obstetrics and Gynecology

## 2022-09-19 ENCOUNTER — Ambulatory Visit (INDEPENDENT_AMBULATORY_CARE_PROVIDER_SITE_OTHER): Payer: Medicare Other | Admitting: Obstetrics and Gynecology

## 2022-09-19 ENCOUNTER — Ambulatory Visit: Payer: Medicare Other | Admitting: Obstetrics and Gynecology

## 2022-09-19 VITALS — BP 175/85 | HR 89 | Resp 16 | Ht <= 58 in | Wt 91.1 lb

## 2022-09-19 DIAGNOSIS — N813 Complete uterovaginal prolapse: Secondary | ICD-10-CM

## 2022-09-19 DIAGNOSIS — N952 Postmenopausal atrophic vaginitis: Secondary | ICD-10-CM

## 2022-09-19 DIAGNOSIS — N3000 Acute cystitis without hematuria: Secondary | ICD-10-CM

## 2022-09-19 DIAGNOSIS — Z4689 Encounter for fitting and adjustment of other specified devices: Secondary | ICD-10-CM | POA: Diagnosis not present

## 2022-09-19 DIAGNOSIS — Z87440 Personal history of urinary (tract) infections: Secondary | ICD-10-CM | POA: Diagnosis not present

## 2022-09-19 DIAGNOSIS — K625 Hemorrhage of anus and rectum: Secondary | ICD-10-CM

## 2022-09-19 DIAGNOSIS — N950 Postmenopausal bleeding: Secondary | ICD-10-CM

## 2022-09-19 DIAGNOSIS — N390 Urinary tract infection, site not specified: Secondary | ICD-10-CM

## 2022-09-19 LAB — POCT URINALYSIS DIPSTICK
Bilirubin, UA: NEGATIVE
Glucose, UA: NEGATIVE
Ketones, UA: NEGATIVE
Nitrite, UA: POSITIVE
Protein, UA: NEGATIVE
Spec Grav, UA: 1.015 (ref 1.010–1.025)
Urobilinogen, UA: 0.2 E.U./dL
pH, UA: 6 (ref 5.0–8.0)

## 2022-09-19 MED ORDER — NITROFURANTOIN MONOHYD MACRO 100 MG PO CAPS: 100.0000 mg | ORAL_CAPSULE | Freq: Two times a day (BID) | ORAL | 0 refills | Status: DC

## 2022-09-23 LAB — IFOBT (OCCULT BLOOD): IFOBT: NEGATIVE

## 2022-09-23 NOTE — Addendum Note (Signed)
Addended by: Chilton Greathouse on: 09/23/2022 02:00 PM   Modules accepted: Orders

## 2022-09-24 LAB — URINE CULTURE

## 2022-09-26 ENCOUNTER — Telehealth: Payer: Self-pay | Admitting: *Deleted

## 2022-09-26 NOTE — Telephone Encounter (Signed)
-----   Message from Albertina Sen[place] Valisha Heslin, [place] sent at 09/12/2022  4:01 PM EST ----- Regarding: Blood Pressure Follow up with patient in 2 weeks on her Blood Pressures per Dr. Unk Lightning 09/12/2022

## 2022-09-26 NOTE — Telephone Encounter (Signed)
Called patient to follow up her Blood pressure Readings since being seen on 09/12/2022  2/17- 112/58 2/18- 136/84 2/19- 124/60 2/21- 148/71 2/22- 147/79          136/69          136/62 2/25- 135 67 2/26- 153/75          124/59 2/27- 110/60   Patient is taking her medication as directed.  Please Advise.

## 2022-09-29 NOTE — Telephone Encounter (Signed)
Dewayne Shorter, MD  You3 days ago   VB Excellent, thank you so much! No changes necessary.     Patient notified and agreed.

## 2022-10-07 ENCOUNTER — Telehealth: Payer: Self-pay

## 2022-10-07 ENCOUNTER — Ambulatory Visit: Payer: Medicare Other

## 2022-10-07 NOTE — Telephone Encounter (Signed)
She's welcome to return and do a urine drop off (nurse visit).

## 2022-10-07 NOTE — Progress Notes (Deleted)
    NURSE VISIT NOTE  Subjective:    [name], female    DOB: Aug 29, 1932, 89 y.o.   MRN: [id number]       HPI  Patient is a 89 y.o. G54P1001 female who presents for {UTI Symptoms:210800002} for {0-10:33138} {TIME; UNITS DAY/WEEK/MONTH:19136}.  Patient denies {UTI Symptoms:210800002}.  Patient {does/does not:33181} have a history of recurrent UTI.  Patient {does/does not:33181} have a history of pyelonephritis.    Objective:    There were no vitals taken for this visit.   Lab Review  No results found for any visits on 10/07/22.  Assessment:   1. Dysuria      Plan:   {AOB UTI PLAN:28528:p}   Quintella Baton, CMA

## 2022-10-07 NOTE — Telephone Encounter (Signed)
Angela Mason called triage stating she feels like her UTI is back, she's completed the medication Dr. Marcelline Mates sent in, She's still having the burning sensation. She states she thinks she needs a urinalysis done.   Please advise?

## 2022-10-08 ENCOUNTER — Ambulatory Visit: Payer: Medicare Other | Admitting: Student

## 2022-10-08 ENCOUNTER — Encounter: Payer: Self-pay | Admitting: Student

## 2022-10-08 VITALS — BP 134/78 | HR 78 | Temp 98.4°F | Wt 91.0 lb

## 2022-10-08 DIAGNOSIS — R319 Hematuria, unspecified: Secondary | ICD-10-CM | POA: Diagnosis not present

## 2022-10-08 DIAGNOSIS — R300 Dysuria: Secondary | ICD-10-CM

## 2022-10-08 MED ORDER — NITROFURANTOIN MONOHYD MACRO 100 MG PO CAPS: 100.0000 mg | ORAL_CAPSULE | Freq: Two times a day (BID) | ORAL | 0 refills | Status: AC

## 2022-10-08 NOTE — Patient Instructions (Signed)
I sent an antibiotic for you.   If your urine culture shows signs that we need to change the antibiotic, we will give a call on Monday.

## 2022-10-08 NOTE — Progress Notes (Signed)
[facility]   Provider: Dr. Dewayne Shorter  Code Status: Full Code Goals of Care:     09/12/2022    2:43 PM  Advanced Directives  Does Patient Have a Medical Advance Directive? Yes  Type of Paramedic of [place];Living will  Does patient want to make changes to medical advance directive? No - Patient declined  Copy of [place] in Chart? No - copy requested     Chief Complaint  Patient presents with   Acute Visit    Complains of Burning with Urination and Diarrhea for a couple of days. Saw GYN on 2/23 and given antibiotic.  U/A Dip done in clinic. Leuk.Mod,Nit.Neg,Uro.NL,Pro.Trace,PH 5.0,Blood Large,SG 1.010,Ket.Neg,Bil.Neg,Glu.Neg.   Quality Metric Gaps    To discuss need for AWV,Tdap,Zoster and Covid.     HPI: Patient is a 89 y.o. female seen today for an acute visit for diarrhea. She was on macrobid for 7 days. She had it on 2/25 and finished them out.   She has had burning with urination at this time. She has not had diarrhea. She has just had the last few days had burning with urination. She has been on the toilet all day with diarrhea for 2 days. She has increased urgency.   She stopped the miralax because of the issue with diarrhea. Her caretaker mentioned to her she hopes she would get back on it after a copule of days.   She had no diarrhea today.    Past Medical History:  Diagnosis Date   Cancer ([place])    SKIN   COPD, mild (HCC)    Dyspnea    Edema    HLD (hyperlipidemia)    HTN (hypertension)    Hypothyroidism    Macular degeneration, bilateral    Varicosities     Past Surgical History:  Procedure Laterality Date   CATARACT EXTRACTION W/PHACO Right 02/03/2017   Procedure: CATARACT EXTRACTION PHACO AND INTRAOCULAR LENS PLACEMENT ([place]);  Surgeon: Birder Robson, MD;  Location: [facility] ORS;  Service: Ophthalmology;  Laterality: Right;  [place] 00:57.2 AP% 18.5 CDE 10.58 Fluid pack lot # [id number]    CATARACT EXTRACTION W/PHACO Left 02/24/2017   Procedure: CATARACT EXTRACTION PHACO AND INTRAOCULAR LENS PLACEMENT ([facility]);  Surgeon: Birder Robson, MD;  Location: [facility] ORS;  Service: Ophthalmology;  Laterality: Left;  [place] 01:12 AP% 25.0 CDE 18.03 Fluid pack lot # [id number]   MOHS SURGERY  2015   nose   SALIVARY GLAND SURGERY Left 2012   TONSILLECTOMY  1940    Allergies  Allergen Reactions   Gentamicin    Levofloxacin Diarrhea    Outpatient Encounter Medications as of 10/08/2022  Medication Sig   ARIPiprazole (ABILIFY) 2 MG tablet Take 1 tablet (2 mg total) by mouth daily with breakfast.   calcium-vitamin D (OSCAL WITH D) 500-200 MG-UNIT tablet Take 2 tablets by mouth daily.   losartan (COZAAR) 50 MG tablet Take 1 tablet (50 mg total) by mouth daily.   lovastatin (MEVACOR) 20 MG tablet Take 20 mg by mouth at bedtime.   Multiple Vitamins-Minerals (PRESERVISION AREDS) TABS Take 1 capsule by mouth 2 (two) times daily.   nitrofurantoin, macrocrystal-monohydrate, (MACROBID) 100 MG capsule Take 1 capsule (100 mg total) by mouth 2 (two) times daily for 7 days.   Omega-3 Fatty Acids (FISH OIL CONCENTRATE PO) Take 1 tablet by mouth daily.    OXYQUINOLONE SULFATE VAGINAL 0.025 % GEL Place 1 application vaginally 2 (two) times a week.   Polyvinyl  Alcohol-Povidone PF (REFRESH) 1.4-0.6 % SOLN Place 1 drop into both eyes in the morning and at bedtime.   [DISCONTINUED] nitrofurantoin, macrocrystal-monohydrate, (MACROBID) 100 MG capsule Take 1 capsule (100 mg total) by mouth 2 (two) times daily.   Facility-Administered Encounter Medications as of 10/08/2022  Medication   estradiol (ESTRACE) vaginal cream 1 Applicatorful    Review of Systems:  Review of Systems  Health Maintenance  Topic Date Due   Medicare Annual Wellness (AWV)  Never done   DTaP/Tdap/Td (1 - Tdap) Never done   Zoster Vaccines- Shingrix (1 of 2) Never done   DEXA SCAN  Never done   COVID-19 Vaccine (1) 06/12/2020   Pneumonia  Vaccine 21+ Years old  Completed   INFLUENZA VACCINE  Completed   HPV VACCINES  Aged Out    Physical Exam: Vitals:   10/08/22 1524  BP: 134/78  Pulse: 78  Temp: 98.4 F (36.9 C)  SpO2: 98%  Weight: 91 lb (41.3 kg)   Body mass index is 19.02 kg/m. Physical Exam Constitutional:      Appearance: Normal appearance.  Cardiovascular:     Rate and Rhythm: Normal rate.  Pulmonary:     Effort: Pulmonary effort is normal.  Abdominal:     Comments: Suprapubic tenderness  Neurological:     General: No focal deficit present.     Mental Status: She is alert and oriented to person, place, and time.     Labs reviewed: Basic Metabolic Panel: Recent Labs    01/27/22 1732 05/01/22 0000 06/26/22 1828 07/31/22 0000  NA 134* 128* 130* 136*  K 4.2 4.1 4.8 4.6  CL 97* 89* 89* 96*  CO2 31 33* 32 34*  GLUCOSE 106*  --  156*  --   BUN 31* 10 26* 20  CREATININE 0.81 0.5 0.63 0.6  CALCIUM 9.0 9.3 9.5  --    Liver Function Tests: Recent Labs    01/27/22 1732 05/01/22 0000  AST 26 17  ALT 25 11  ALKPHOS 65 84  BILITOT 0.7  --   PROT 6.8  --   ALBUMIN 3.8 4.1   No results for input(s): "LIPASE", "AMYLASE" in the last 8760 hours. No results for input(s): "AMMONIA" in the last 8760 hours. CBC: Recent Labs    01/27/22 1732 01/29/22 0534 06/26/22 1828  WBC 20.5* 12.4* 9.2  NEUTROABS 18.5*  --  8.0*  HGB 12.9 10.8* 12.1  HCT 39.7 33.8* 37.6  MCV 98.3 100.0 98.4  PLT 217 164 268   Lipid Panel: Recent Labs    05/01/22 0000  CHOL 175  HDL 64  LDLCALC 91  TRIG 103   No results found for: "HGBA1C"  Procedures since last visit: No results found.  Assessment/Plan Dysuria - Plan: nitrofurantoin, macrocrystal-monohydrate, (MACROBID) 100 MG capsule  Hematuria, unspecified type Urine dip in clinic showed LE and blood. Will plan to start antibiotics for 7 days as outlined above. F/u culture to determine need for change in ABX.    Labs/tests ordered:  * No order type  specified * Next appt:  11/12/2022

## 2022-10-09 ENCOUNTER — Telehealth: Payer: Self-pay

## 2022-10-09 NOTE — Telephone Encounter (Signed)
Pt calling; saw on mychart that she should be taking estradiol estrace.  What is this?  She has never heard of it.  [phone]

## 2022-10-13 ENCOUNTER — Telehealth: Payer: Self-pay | Admitting: *Deleted

## 2022-10-13 NOTE — Telephone Encounter (Signed)
-----   Message from Dewayne Shorter, MD sent at 10/10/2022  5:07 PM EDT ----- Please contact Ms. Dicten to let her know that she is on the apporpriate antibiotic for her urinary tract infection. She should continue the antibiotic to complete the 10-day course.  ----- Message ----- From: Carroll Kinds, CMA Sent: 10/10/2022   5:01 PM EDT To: Dewayne Shorter, MD

## 2022-10-13 NOTE — Telephone Encounter (Signed)
Patient notified and agreed.  

## 2022-10-16 NOTE — Telephone Encounter (Signed)
MyChart msg sent.

## 2022-10-16 NOTE — Telephone Encounter (Signed)
Patient has been counseled several times on use of estrogen cream for treatment of vaginal atrophy which may likely be causing bleeding of the vagina. Please let her know that the cream is for vaginal atrophy.

## 2022-10-17 ENCOUNTER — Ambulatory Visit: Payer: Medicare Other | Admitting: Student

## 2022-10-17 VITALS — BP 140/80 | HR 56 | Temp 98.0°F | Resp 18

## 2022-10-17 DIAGNOSIS — I10 Essential (primary) hypertension: Secondary | ICD-10-CM | POA: Diagnosis not present

## 2022-10-17 DIAGNOSIS — F331 Major depressive disorder, recurrent, moderate: Secondary | ICD-10-CM | POA: Diagnosis not present

## 2022-10-17 DIAGNOSIS — N3289 Other specified disorders of bladder: Secondary | ICD-10-CM | POA: Diagnosis not present

## 2022-10-17 DIAGNOSIS — K5901 Slow transit constipation: Secondary | ICD-10-CM | POA: Diagnosis not present

## 2022-10-17 DIAGNOSIS — B3731 Acute candidiasis of vulva and vagina: Secondary | ICD-10-CM

## 2022-10-17 MED ORDER — FLUCONAZOLE 150 MG PO TABS: 150.0000 mg | ORAL_TABLET | Freq: Once | ORAL | 0 refills | Status: AC

## 2022-10-17 MED ORDER — ARIPIPRAZOLE 2 MG PO TABS: 2.0000 mg | ORAL_TABLET | Freq: Every day | ORAL | 2 refills | Status: DC

## 2022-10-17 MED ORDER — LOVASTATIN 20 MG PO TABS: 20.0000 mg | ORAL_TABLET | Freq: Every day | ORAL | 3 refills | Status: DC

## 2022-10-17 NOTE — Patient Instructions (Addendum)
You should take one Fluconazole tablet today. You should take the second Fluconazole tablet on Monday morning. If you have continued symptoms on Tuesday, please call us.  Please continue to take Miralax  - Take 1/2 capful on Mondays, Wednesdays, and Fridays.  Make sure you are drinking at least 1.5 liters (3 styrofoam cups from hearth per day).  Please discard medications that you are no longer taking.  We discussed: atenolol and the two medications from previous providers.

## 2022-10-17 NOTE — Progress Notes (Unsigned)
[facility] clinic [facility].   Provider: Dr. Dewayne Shorter  Code Status: Full Code.  Goals of Care:     10/17/2022   11:49 AM  Advanced Directives  Does Patient Have a Medical Advance Directive? Yes  Type of Paramedic of [place];Living will  Does patient want to make changes to medical advance directive? No - Patient declined  Copy of [place] in Chart? No - copy requested     Chief Complaint  Patient presents with   Acute Visit    Consultation with patient and Daughter.    Quality Metric Gaps    To discuss need for AWV,Tdap,Zoster,Covid and Dexascan.     HPI: Patient is a 89 y.o. female seen today for an acute visit for follow up  UTI symptoms have not improved after most recent antibiotics. She has continued to have dysuria. Does not have nausea/vomiting/fever/chills. She has limited some of her water intake due to her water bottle being difficult to clean. She doesn't use the premarin cream. She says she is so tired she doesn't.  Drinking fluids- She is drinking more water to help with bowel movements.  Clarifying medications. Patient has some old medications still in her cabinets. Her daughter is  with her today who writes down notes of medications she needs to have discontinued.    Past Medical History:  Diagnosis Date   Cancer ([place])    SKIN   COPD, mild (HCC)    Dyspnea    Edema    HLD (hyperlipidemia)    HTN (hypertension)    Hypothyroidism    Macular degeneration, bilateral    Varicosities     Past Surgical History:  Procedure Laterality Date   CATARACT EXTRACTION W/PHACO Right 02/03/2017   Procedure: CATARACT EXTRACTION PHACO AND INTRAOCULAR LENS PLACEMENT ([place]);  Surgeon: Birder Robson, MD;  Location: [facility] ORS;  Service: Ophthalmology;  Laterality: Right;  [place] 00:57.2 AP% 18.5 CDE 10.58 Fluid pack lot # [id number]   CATARACT EXTRACTION W/PHACO Left 02/24/2017   Procedure: CATARACT EXTRACTION PHACO AND  INTRAOCULAR LENS PLACEMENT (IOC);  Surgeon: Birder Robson, MD;  Location: [facility] ORS;  Service: Ophthalmology;  Laterality: Left;  [place] 01:12 AP% 25.0 CDE 18.03 Fluid pack lot # [id number]   MOHS SURGERY  2015   nose   SALIVARY GLAND SURGERY Left 2012   TONSILLECTOMY  1940    Allergies  Allergen Reactions   Gentamicin    Levofloxacin Diarrhea    Outpatient Encounter Medications as of 10/17/2022  Medication Sig   ARIPiprazole (ABILIFY) 2 MG tablet Take 1 tablet (2 mg total) by mouth daily with breakfast.   calcium-vitamin D (OSCAL WITH D) 500-200 MG-UNIT tablet Take 2 tablets by mouth daily.   losartan (COZAAR) 50 MG tablet Take 1 tablet (50 mg total) by mouth daily.   lovastatin (MEVACOR) 20 MG tablet Take 20 mg by mouth at bedtime.   Multiple Vitamins-Minerals (PRESERVISION AREDS) TABS Take 1 capsule by mouth 2 (two) times daily.   Omega-3 Fatty Acids (FISH OIL CONCENTRATE PO) Take 1 tablet by mouth daily.    OXYQUINOLONE SULFATE VAGINAL 0.025 % GEL Place 1 application vaginally 2 (two) times a week.   Polyvinyl Alcohol-Povidone PF (REFRESH) 1.4-0.6 % SOLN Place 1 drop into both eyes in the morning and at bedtime.   Facility-Administered Encounter Medications as of 10/17/2022  Medication   estradiol (ESTRACE) vaginal cream 1 Applicatorful    Review of Systems:  Review of Systems  Health Maintenance  Topic Date Due   Medicare Annual Wellness (AWV)  Never done   DTaP/Tdap/Td (1 - Tdap) Never done   Zoster Vaccines- Shingrix (1 of 2) Never done   DEXA SCAN  Never done   COVID-19 Vaccine (1) 06/12/2020   Pneumonia Vaccine 60+ Years old  Completed   INFLUENZA VACCINE  Completed   HPV VACCINES  Aged Out    Physical Exam: There were no vitals filed for this visit. There is no height or weight on file to calculate BMI. Physical Exam  Labs reviewed: Basic Metabolic Panel: Recent Labs    01/27/22 1732 05/01/22 0000 06/26/22 1828 07/31/22 0000  NA 134* 128* 130* 136*  K  4.2 4.1 4.8 4.6  CL 97* 89* 89* 96*  CO2 31 33* 32 34*  GLUCOSE 106*  --  156*  --   BUN 31* 10 26* 20  CREATININE 0.81 0.5 0.63 0.6  CALCIUM 9.0 9.3 9.5  --    Liver Function Tests: Recent Labs    01/27/22 1732 05/01/22 0000  AST 26 17  ALT 25 11  ALKPHOS 65 84  BILITOT 0.7  --   PROT 6.8  --   ALBUMIN 3.8 4.1   No results for input(s): "LIPASE", "AMYLASE" in the last 8760 hours. No results for input(s): "AMMONIA" in the last 8760 hours. CBC: Recent Labs    01/27/22 1732 01/29/22 0534 06/26/22 1828  WBC 20.5* 12.4* 9.2  NEUTROABS 18.5*  --  8.0*  HGB 12.9 10.8* 12.1  HCT 39.7 33.8* 37.6  MCV 98.3 100.0 98.4  PLT 217 164 268   Lipid Panel: Recent Labs    05/01/22 0000  CHOL 175  HDL 64  LDLCALC 91  TRIG 103   No results found for: "HGBA1C"  Procedures since last visit: No results found.  Assessment/Plan Constipation by delayed colonic transit  MDD (major depressive disorder), recurrent episode, moderate (HCC) - Plan: ARIPiprazole (ABILIFY) 2 MG tablet  Unstable bladder  Primary hypertension - Plan: lovastatin (MEVACOR) 20 MG tablet  Yeast vaginitis - Plan: fluconazole (DIFLUCAN) 150 MG tablet Encourage patient to take medication to treat vaginitis. F/u pending resolution of symptoms on Tuesday. Plan for MA to call her to determine her need of urinalysis/culture collection. Patient with significant confusion with medications. Discussed concern to daughter and importance of encouraging patient to receive care she needs in higher level of care setting such as assisted living. Continue goals of care. BP improved on current regimen. Given home BP measurements, will defer adjusting medications at this time. For constipation, recommend taking miralax MWF. Increase water intake and fiber.    Labs/tests ordered:  * No order type specified * Next appt:  11/12/2022

## 2022-10-19 ENCOUNTER — Encounter: Payer: Self-pay | Admitting: Student

## 2022-10-21 ENCOUNTER — Ambulatory Visit: Payer: Medicare Other | Admitting: Nurse Practitioner

## 2022-10-22 ENCOUNTER — Encounter: Payer: Self-pay | Admitting: Student

## 2022-10-22 ENCOUNTER — Ambulatory Visit: Payer: Medicare Other | Admitting: Student

## 2022-10-22 VITALS — BP 136/74 | HR 71 | Temp 97.9°F | Ht <= 58 in | Wt 85.0 lb

## 2022-10-22 DIAGNOSIS — N3001 Acute cystitis with hematuria: Secondary | ICD-10-CM

## 2022-10-22 MED ORDER — SACCHAROMYCES BOULARDII 250 MG PO CAPS: 250.0000 mg | ORAL_CAPSULE | Freq: Two times a day (BID) | ORAL | 0 refills | Status: AC

## 2022-10-22 MED ORDER — CIPROFLOXACIN HCL 500 MG PO TABS: 500.0000 mg | ORAL_TABLET | Freq: Two times a day (BID) | ORAL | 0 refills | Status: DC

## 2022-10-22 NOTE — Progress Notes (Unsigned)
[facility] [place] [facility]  Provider: Dr. Dewayne Shorter  Code Status: Full Code Goals of Care:     10/22/2022    1:09 PM  Advanced Directives  Does Patient Have a Medical Advance Directive? Yes  Type of Paramedic of [place];Living will  Does patient want to make changes to medical advance directive? No - Patient declined  Copy of [place] in Chart? No - copy requested     Chief Complaint  Patient presents with   Acute Visit    Still having Burning when Urinates. Completed Antibiotic    HPI: Patient is a 89 y.o. female seen today for an acute visit for  - she mentioned she would be open to someone coming to clean the house.   They are worked   Past Medical History:  Diagnosis Date   Cancer ([place])    SKIN   COPD, mild (HCC)    Dyspnea    Edema    HLD (hyperlipidemia)    HTN (hypertension)    Hypothyroidism    Macular degeneration, bilateral    Varicosities     Past Surgical History:  Procedure Laterality Date   CATARACT EXTRACTION W/PHACO Right 02/03/2017   Procedure: CATARACT EXTRACTION PHACO AND INTRAOCULAR LENS PLACEMENT ([place]);  Surgeon: Birder Robson, MD;  Location: [facility] ORS;  Service: Ophthalmology;  Laterality: Right;  [place] 00:57.2 AP% 18.5 CDE 10.58 Fluid pack lot # [id number]   CATARACT EXTRACTION W/PHACO Left 02/24/2017   Procedure: CATARACT EXTRACTION PHACO AND INTRAOCULAR LENS PLACEMENT ([facility]);  Surgeon: Birder Robson, MD;  Location: [facility] ORS;  Service: Ophthalmology;  Laterality: Left;  [place] 01:12 AP% 25.0 CDE 18.03 Fluid pack lot # [id number]   MOHS SURGERY  2015   nose   SALIVARY GLAND SURGERY Left 2012   TONSILLECTOMY  1940    Allergies  Allergen Reactions   Gentamicin    Levofloxacin Diarrhea    Outpatient Encounter Medications as of 10/22/2022  Medication Sig   ARIPiprazole (ABILIFY) 2 MG tablet Take 1 tablet (2 mg total) by mouth daily with breakfast.   calcium-vitamin D (OSCAL WITH D)  500-200 MG-UNIT tablet Take 2 tablets by mouth daily.   estradiol (ESTRACE) 0.1 MG/GM vaginal cream Place 1 Applicatorful vaginally 2 (two) times a week.   losartan (COZAAR) 50 MG tablet Take 1 tablet (50 mg total) by mouth daily.   lovastatin (MEVACOR) 20 MG tablet Take 1 tablet (20 mg total) by mouth at bedtime.   Multiple Vitamins-Minerals (PRESERVISION AREDS) TABS Take 1 capsule by mouth 2 (two) times daily.   Omega-3 Fatty Acids (FISH OIL CONCENTRATE PO) Take 1 tablet by mouth daily.    OXYQUINOLONE SULFATE VAGINAL 0.025 % GEL Place 1 application vaginally 2 (two) times a week.   Polyvinyl Alcohol-Povidone PF (REFRESH) 1.4-0.6 % SOLN Place 1 drop into both eyes in the morning and at bedtime.   Facility-Administered Encounter Medications as of 10/22/2022  Medication   estradiol (ESTRACE) vaginal cream 1 Applicatorful    Review of Systems:  Review of Systems  Health Maintenance  Topic Date Due   Medicare Annual Wellness (AWV)  Never done   DTaP/Tdap/Td (1 - Tdap) Never done   Zoster Vaccines- Shingrix (1 of 2) Never done   DEXA SCAN  Never done   COVID-19 Vaccine (1) 06/12/2020   Pneumonia Vaccine 28+ Years old  Completed   INFLUENZA VACCINE  Completed   HPV VACCINES  Aged Out    Physical Exam: Vitals:  10/22/22 1305  BP: 136/74  Pulse: 71  Temp: 97.9 F (36.6 C)  SpO2: 98%  Weight: 85 lb (38.6 kg)  Height: 4\' 10"  (1.473 m)   Body mass index is 17.77 kg/m. Physical Exam  Labs reviewed: Basic Metabolic Panel: Recent Labs    01/27/22 1732 05/01/22 0000 06/26/22 1828 07/31/22 0000  NA 134* 128* 130* 136*  K 4.2 4.1 4.8 4.6  CL 97* 89* 89* 96*  CO2 31 33* 32 34*  GLUCOSE 106*  --  156*  --   BUN 31* 10 26* 20  CREATININE 0.81 0.5 0.63 0.6  CALCIUM 9.0 9.3 9.5  --    Liver Function Tests: Recent Labs    01/27/22 1732 05/01/22 0000  AST 26 17  ALT 25 11  ALKPHOS 65 84  BILITOT 0.7  --   PROT 6.8  --   ALBUMIN 3.8 4.1   No results for input(s):  "LIPASE", "AMYLASE" in the last 8760 hours. No results for input(s): "AMMONIA" in the last 8760 hours. CBC: Recent Labs    01/27/22 1732 01/29/22 0534 06/26/22 1828  WBC 20.5* 12.4* 9.2  NEUTROABS 18.5*  --  8.0*  HGB 12.9 10.8* 12.1  HCT 39.7 33.8* 37.6  MCV 98.3 100.0 98.4  PLT 217 164 268   Lipid Panel: Recent Labs    05/01/22 0000  CHOL 175  HDL 64  LDLCALC 91  TRIG 103   No results found for: "HGBA1C"  Procedures since last visit: No results found.  Assessment/Plan There are no diagnoses linked to this encounter.   Labs/tests ordered:  * No order type specified * Next appt:  11/12/2022

## 2022-10-22 NOTE — Patient Instructions (Addendum)
When you have occupational therapy at your home: - Request a toilet seat cover to help prevent you from falling into the toilet with urination.    Please take the antibiotic and probiotic together in the morning and evening.    Happy Birthday!

## 2022-10-23 ENCOUNTER — Other Ambulatory Visit: Payer: Self-pay | Admitting: Student

## 2022-10-23 DIAGNOSIS — I10 Essential (primary) hypertension: Secondary | ICD-10-CM

## 2022-10-27 ENCOUNTER — Other Ambulatory Visit: Payer: Self-pay

## 2022-10-27 MED ORDER — ESTRADIOL 0.1 MG/GM VA CREA: 1.0000 | TOPICAL_CREAM | VAGINAL | 4 refills | Status: DC

## 2022-10-27 NOTE — Telephone Encounter (Signed)
Patient contacted office requesting refill on Estrace vaginal cream, this was last ordered on 01/13/22 and patients last office visit 09/19/22. Please review request. Angela Mason

## 2022-10-27 NOTE — Telephone Encounter (Signed)
It's fine to refill if she's been seen within the past year

## 2022-10-28 ENCOUNTER — Telehealth: Payer: Self-pay

## 2022-10-28 MED ORDER — CIPROFLOXACIN HCL 500 MG PO TABS: 500.0000 mg | ORAL_TABLET | Freq: Two times a day (BID) | ORAL | 0 refills | Status: DC

## 2022-10-28 NOTE — Addendum Note (Signed)
Addended by: Dewayne Shorter on: 10/28/2022 08:22 PM   Modules accepted: Orders

## 2022-10-28 NOTE — Telephone Encounter (Addendum)
Patient called stating that she isn't feeling any better. Patient is still having burning sensation. Looking back at the note it looks like patient is supposed to be taking cipro,but it looks like it wasn't sent to pharmacy. Patient has only been taking probiotic. She would also like to know if you want to see her again. Please advise.  Message routed to Dr. Dewayne Shorter

## 2022-10-29 ENCOUNTER — Ambulatory Visit: Payer: Medicare Other | Admitting: Student

## 2022-10-29 ENCOUNTER — Encounter: Payer: Self-pay | Admitting: Student

## 2022-10-29 VITALS — BP 128/76 | HR 74 | Temp 98.7°F | Ht <= 58 in | Wt 86.0 lb

## 2022-10-29 DIAGNOSIS — N3001 Acute cystitis with hematuria: Secondary | ICD-10-CM | POA: Diagnosis not present

## 2022-10-29 NOTE — Telephone Encounter (Signed)
Patient came in for an appointment with Dr. Unk Lightning to clarify why Dr. Unk Lightning was calling in another Antibiotic. Stated that she had just completed the Cipro Rx on Monday.   I tried to explain to the patient that the message that Dr. Unk Lightning received stated that patient was ONLY taking the Probiotic and NEVER took the Antibiotic. Patient and Caregiver was stated that that was never said and that the Communication was broken.   Patient saw Dr. Unk Lightning and discussed concerns.

## 2022-10-29 NOTE — Patient Instructions (Signed)
Please get the ciprofloxacin 500 mg antibiotic from your pharmacy. This will be 5 additional days to complete 10 days of antibiotics.   The folks who collect labs will come to your house on Monday at 7:30 AM to collect labs to check your kidney function (CMP), sugar (A1c), Hemoglobin (CBC), and your electrolytes (magnesium).

## 2022-10-29 NOTE — Telephone Encounter (Signed)
Angela Shorter, MD  You16 hours ago (8:23 PM)   VB Please contact patient to ask her to pick up her antibiotic, It doesn't look like she ever picked it up from the pharmacy. Her  urine does look like she still has an infection. Resent tonight 10/28/2022        Patient scheduled an appointment to discuss medications with Dr. Unk Lightning.

## 2022-10-29 NOTE — Progress Notes (Signed)
[facility]  Provider: Dr. Dewayne Shorter  Code Status: Full Code Goals of Care:     10/29/2022    3:13 PM  Advanced Directives  Does Patient Have a Medical Advance Directive? Yes  Type of Paramedic of [place];Living will  Does patient want to make changes to medical advance directive? No - Patient declined  Copy of [place] in Chart? Yes - validated most recent copy scanned in chart (See row information)     Chief Complaint  Patient presents with  . Acute Visit    Discuss medication concerns. Dr. Marcelline Mates, GYN does not want her to use the Hormone cream too often. Also Patient has completed the Cipro on Monday and confused about a Rx being sent to the pharmacy.     HPI: Patient is a 89 y.o. female seen today for an acute visit for continued dysuria. She finished the 5 days of ciprofloxacin on Monday. Continues to have dysuria and increased frequency.   She has not had diarrhea since taking the medication. She takes a long time to have a bowel movement, but does not have diarrhea.   Discussed taking pyridium, but patient would prefer to know if she is having a urinary tract infection.   Past Medical History:  Diagnosis Date  . Cancer    SKIN  . COPD, mild   . Dyspnea   . Edema   . HLD (hyperlipidemia)   . HTN (hypertension)   . Hypothyroidism   . Macular degeneration, bilateral   . Varicosities     Past Surgical History:  Procedure Laterality Date  . CATARACT EXTRACTION W/PHACO Right 02/03/2017   Procedure: CATARACT EXTRACTION PHACO AND INTRAOCULAR LENS PLACEMENT ([facility]);  Surgeon: Birder Robson, MD;  Location: [facility] ORS;  Service: Ophthalmology;  Laterality: Right;  [place] 00:57.2 AP% 18.5 CDE 10.58 Fluid pack lot # [id number]  . CATARACT EXTRACTION W/PHACO Left 02/24/2017   Procedure: CATARACT EXTRACTION PHACO AND INTRAOCULAR LENS PLACEMENT ([facility]);  Surgeon: Birder Robson, MD;  Location: [facility] ORS;  Service:  Ophthalmology;  Laterality: Left;  [place] 01:12 AP% 25.0 CDE 18.03 Fluid pack lot # [id number]  . MOHS SURGERY  2015   nose  . SALIVARY GLAND SURGERY Left 2012  . TONSILLECTOMY  1940    Allergies  Allergen Reactions  . Gentamicin   . Levofloxacin Diarrhea    Outpatient Encounter Medications as of 10/29/2022  Medication Sig  . ARIPiprazole (ABILIFY) 2 MG tablet Take 1 tablet (2 mg total) by mouth daily with breakfast.  . calcium-vitamin D (OSCAL WITH D) 500-200 MG-UNIT tablet Take 2 tablets by mouth daily.  Marland Kitchen estradiol (ESTRACE) 0.1 MG/GM vaginal cream Place 1 Applicatorful vaginally 2 (two) times a week.  . losartan (COZAAR) 50 MG tablet Take 1 tablet (50 mg total) by mouth daily.  Marland Kitchen lovastatin (MEVACOR) 20 MG tablet Take 1 tablet (20 mg total) by mouth at bedtime.  . Multiple Vitamins-Minerals (PRESERVISION AREDS) TABS Take 1 capsule by mouth 2 (two) times daily.  . Omega-3 Fatty Acids (FISH OIL CONCENTRATE PO) Take 1 tablet by mouth daily.   Levin Erp SULFATE VAGINAL 0.025 % GEL Place 1 application vaginally 2 (two) times a week.  . Polyvinyl Alcohol-Povidone PF (REFRESH) 1.4-0.6 % SOLN Place 1 drop into both eyes in the morning and at bedtime.  . saccharomyces boulardii (FLORASTOR) 250 MG capsule Take 1 capsule (250 mg total) by mouth 2 (two) times daily.  . [DISCONTINUED] ciprofloxacin (CIPRO) 500  MG tablet Take 1 tablet (500 mg total) by mouth 2 (two) times daily for 5 days.   No facility-administered encounter medications on file as of 10/29/2022.    Review of Systems:  Review of Systems  Health Maintenance  Topic Date Due  . Medicare Annual Wellness (AWV)  Never done  . DTaP/Tdap/Td (1 - Tdap) Never done  . Zoster Vaccines- Shingrix (1 of 2) Never done  . DEXA SCAN  Never done  . COVID-19 Vaccine (1) 06/12/2020  . INFLUENZA VACCINE  02/26/2023  . Pneumonia Vaccine 83+ Years old  Completed  . HPV VACCINES  Aged Out    Physical Exam: Vitals:   10/29/22 1504  BP:  128/76  Pulse: 74  Temp: 98.7 F (37.1 C)  SpO2: 96%  Weight: 86 lb (39 kg)  Height: 4\' 10"  (1.473 m)   Body mass index is 17.97 kg/m. Physical Exam  Labs reviewed: Basic Metabolic Panel: Recent Labs    01/27/22 1732 05/01/22 0000 06/26/22 1828 07/31/22 0000  NA 134* 128* 130* 136*  K 4.2 4.1 4.8 4.6  CL 97* 89* 89* 96*  CO2 31 33* 32 34*  GLUCOSE 106*  --  156*  --   BUN 31* 10 26* 20  CREATININE 0.81 0.5 0.63 0.6  CALCIUM 9.0 9.3 9.5  --    Liver Function Tests: Recent Labs    01/27/22 1732 05/01/22 0000  AST 26 17  ALT 25 11  ALKPHOS 65 84  BILITOT 0.7  --   PROT 6.8  --   ALBUMIN 3.8 4.1   No results for input(s): "LIPASE", "AMYLASE" in the last 8760 hours. No results for input(s): "AMMONIA" in the last 8760 hours. CBC: Recent Labs    01/27/22 1732 01/29/22 0534 06/26/22 1828  WBC 20.5* 12.4* 9.2  NEUTROABS 18.5*  --  8.0*  HGB 12.9 10.8* 12.1  HCT 39.7 33.8* 37.6  MCV 98.3 100.0 98.4  PLT 217 164 268   Lipid Panel: Recent Labs    05/01/22 0000  CHOL 175  HDL 64  LDLCALC 91  TRIG 103   No results found for: "HGBA1C"  Procedures since last visit: No results found.  Assessment/Plan There are no diagnoses linked to this encounter.   Labs/tests ordered:  * No order type specified * Next appt:  11/12/2022

## 2022-10-31 ENCOUNTER — Telehealth: Payer: Self-pay

## 2022-10-31 ENCOUNTER — Telehealth: Payer: Self-pay | Admitting: *Deleted

## 2022-10-31 NOTE — Telephone Encounter (Signed)
Per Dr. Sydnee Cabal-- from [facility] Gynecologist  VB She really does not use the cream as often as it should be prescribed as she has concerns about cancer even though I have tried to reassure her numerous times that the risk is low. She only uses it maybe a week before her pessary check to help with ease of removal. She should use a dime sized amount at the urethra three times weekly. She can use a full applicator full three times a week on the week of her pessary cleaning visits.     Called patient to inform. Patient had questions and spoke with Dr. Sydnee Cabal.

## 2022-10-31 NOTE — Telephone Encounter (Signed)
Pt calling; calling about her vag cream; will she have enough before her nv?  Does she still do her 4d before qHS like she's been doing before visit or just the the usual.  [phone]  Adv pt to just do twice a week like she has been doing.

## 2022-11-03 LAB — BASIC METABOLIC PANEL
BUN: 21 (ref 4–21)
CO2: 35 — AB (ref 13–22)
Chloride: 94 — AB (ref 99–108)
Creatinine: 0.6 (ref 0.5–1.1)
Glucose: 106
Potassium: 4.1 mEq/L (ref 3.5–5.1)
Sodium: 134 — AB (ref 137–147)

## 2022-11-03 LAB — CBC AND DIFFERENTIAL
HCT: 36 (ref 36–46)
Hemoglobin: 12.1 (ref 12.0–16.0)
Neutrophils Absolute: 5116
Platelets: 310 10*3/uL (ref 150–400)
WBC: 6.5

## 2022-11-03 LAB — HEPATIC FUNCTION PANEL
ALT: 18 U/L (ref 7–35)
AST: 19 (ref 13–35)
Alkaline Phosphatase: 74 (ref 25–125)

## 2022-11-03 LAB — COMPREHENSIVE METABOLIC PANEL
Albumin: 4.2 (ref 3.5–5.0)
Calcium: 9.2 (ref 8.7–10.7)
Globulin: 2.5

## 2022-11-03 LAB — LIPID PANEL
Cholesterol: 181 (ref 0–200)
HDL: 74 — AB (ref 35–70)
LDL Cholesterol: 88
Triglycerides: 101 (ref 40–160)

## 2022-11-03 LAB — CBC: RBC: 3.78 — AB (ref 3.87–5.11)

## 2022-11-03 LAB — HEMOGLOBIN A1C: Hemoglobin A1C: 5.6

## 2022-11-07 ENCOUNTER — Telehealth: Payer: Self-pay | Admitting: *Deleted

## 2022-11-07 NOTE — Telephone Encounter (Signed)
Patient notified and agreed.  

## 2022-11-07 NOTE — Telephone Encounter (Signed)
-----   Message from [name], MD sent at 11/07/2022  9:42 AM EDT ----- Please contact patient to inform her that her labs are normal other than salt levels slightly low. No changes to medications at this time. A1c is normal (screening for diabetes). White blood cell count is normal (infection fighting cells). Kidney function is normal.  ----- Message ----- From: Maurice Small, CMA Sent: 11/05/2022   9:42 AM EDT To: [name], MD  Abstracted

## 2022-11-11 ENCOUNTER — Encounter: Payer: Medicare Other | Admitting: Family

## 2022-11-12 ENCOUNTER — Ambulatory Visit: Payer: Medicare Other | Admitting: Student

## 2022-11-14 ENCOUNTER — Ambulatory Visit: Payer: Medicare Other | Admitting: Student

## 2022-11-14 ENCOUNTER — Encounter: Payer: Self-pay | Admitting: Student

## 2022-11-14 VITALS — BP 138/86 | HR 61 | Temp 97.7°F | Ht <= 58 in | Wt 86.0 lb

## 2022-11-14 DIAGNOSIS — J449 Chronic obstructive pulmonary disease, unspecified: Secondary | ICD-10-CM | POA: Diagnosis not present

## 2022-11-14 DIAGNOSIS — E43 Unspecified severe protein-calorie malnutrition: Secondary | ICD-10-CM

## 2022-11-14 DIAGNOSIS — N390 Urinary tract infection, site not specified: Secondary | ICD-10-CM | POA: Diagnosis not present

## 2022-11-14 DIAGNOSIS — K5901 Slow transit constipation: Secondary | ICD-10-CM

## 2022-11-14 DIAGNOSIS — I10 Essential (primary) hypertension: Secondary | ICD-10-CM

## 2022-11-14 DIAGNOSIS — F323 Major depressive disorder, single episode, severe with psychotic features: Secondary | ICD-10-CM

## 2022-11-14 DIAGNOSIS — N813 Complete uterovaginal prolapse: Secondary | ICD-10-CM

## 2022-11-14 NOTE — Patient Instructions (Signed)
Continue to take miralax (1/2 capful daily).   Take 2 capsules nightly of senna. You may decrease to one nightly or even every other night if you notice you are having diarrhea.   For the rectal burning, consider purchasing Preparation-H. There is some numbing medicine in this cream that can prevent some burning.

## 2022-11-14 NOTE — Progress Notes (Signed)
Location:  [facility].   Provider: Dr. [name]  Code Status: Full Code Goals of Care:     11/14/2022    1:17 PM  Advanced Directives  Does Patient Have a Medical Advance Directive? Yes  Type of Estate agent of [place];Living will  Does patient want to make changes to medical advance directive? No - Patient declined  Copy of Healthcare Power of Attorney in Chart? No - copy requested     Chief Complaint  Patient presents with   Medical Management of Chronic Issues    Medical Management of Chronic Issues. 2 Month Follow up   Quality Metric Gaps    To discuss need for AWV, Tdap,Zoster and Covid Vaccine or postpone if patient refuses.     HPI: Patient is a 89 y.o. female seen today for medical management of chronic diseases.    She has trouble getting out of the house before 12 PM. She takes the miralax, but still has been on the toilet for longer periods of time. She has a sensation that she hasn't had a good bowel movement, so she doesn't get off of the toilet.   When she drank more   She had a round of macrobid for 10days Bactrim for 5 days.  Ciprofloxacin for 10 day s on 4/2  She denies shortness of breath or chest pain.   She notices she has been sleeping longer lately -- she gets up every 2-3 hours to use the bathroom. She is doing okay without mirtazapine.    Past Medical History:  Diagnosis Date   Cancer    SKIN   COPD, mild    Dyspnea    Edema    HLD (hyperlipidemia)    HTN (hypertension)    Hypothyroidism    Macular degeneration, bilateral    Varicosities     Past Surgical History:  Procedure Laterality Date   CATARACT EXTRACTION W/PHACO Right 02/03/2017   Procedure: CATARACT EXTRACTION PHACO AND INTRAOCULAR LENS PLACEMENT (IOC);  Surgeon: Galen Manila, MD;  Location: [facility] ORS;  Service: Ophthalmology;  Laterality: Right;  [place] 00:57.2 AP% 18.5 CDE 10.58 Fluid pack lot # [id number]   CATARACT EXTRACTION  W/PHACO Left 02/24/2017   Procedure: CATARACT EXTRACTION PHACO AND INTRAOCULAR LENS PLACEMENT (IOC);  Surgeon: Galen Manila, MD;  Location: [facility] ORS;  Service: Ophthalmology;  Laterality: Left;  [place] 01:12 AP% 25.0 CDE 18.03 Fluid pack lot # [id number]   MOHS SURGERY  2015   nose   SALIVARY GLAND SURGERY Left 2012   TONSILLECTOMY  1940    Allergies  Allergen Reactions   Gentamicin    Levofloxacin Diarrhea    Outpatient Encounter Medications as of 11/14/2022  Medication Sig   ARIPiprazole (ABILIFY) 2 MG tablet Take 1 tablet (2 mg total) by mouth daily with breakfast.   calcium-vitamin D (OSCAL WITH D) 500-200 MG-UNIT tablet Take 2 tablets by mouth daily.   estradiol (ESTRACE) 0.1 MG/GM vaginal cream Place 1 Applicatorful vaginally 2 (two) times a week.   losartan (COZAAR) 50 MG tablet Take 1 tablet (50 mg total) by mouth daily.   lovastatin (MEVACOR) 20 MG tablet Take 1 tablet (20 mg total) by mouth at bedtime.   Multiple Vitamins-Minerals (PRESERVISION AREDS) TABS Take 1 capsule by mouth 2 (two) times daily.   Omega-3 Fatty Acids (FISH OIL CONCENTRATE PO) Take 1 tablet by mouth daily.    OXYQUINOLONE SULFATE VAGINAL 0.025 % GEL Place 1 application vaginally 2 (two) times a week.  Polyvinyl Alcohol-Povidone PF (REFRESH) 1.4-0.6 % SOLN Place 1 drop into both eyes in the morning and at bedtime.   saccharomyces boulardii (FLORASTOR) 250 MG capsule Take 1 capsule (250 mg total) by mouth 2 (two) times daily.   No facility-administered encounter medications on file as of 11/14/2022.    Review of Systems:  Review of Systems  Health Maintenance  Topic Date Due   Medicare Annual Wellness (AWV)  Never done   DTaP/Tdap/Td (1 - Tdap) Never done   Zoster Vaccines- Shingrix (1 of 2) Never done   DEXA SCAN  Never done   COVID-19 Vaccine (1) 06/12/2020   INFLUENZA VACCINE  02/26/2023   Pneumonia Vaccine 63+ Years old  Completed   HPV VACCINES  Aged Out    Physical Exam: Vitals:    11/14/22 1313  BP: 138/86  Pulse: 61  Temp: 97.7 F (36.5 C)  SpO2: 96%  Weight: 86 lb (39 kg)  Height:  (1.473 m)   Body mass index is 17.97 kg/m. Physical Exam  Labs reviewed: Basic Metabolic Panel: Recent Labs    01/27/22 1732 05/01/22 0000 06/26/22 1828 07/31/22 0000 11/03/22 0000  NA 134* 128* 130* 136* 134*  K 4.2 4.1 4.8 4.6 4.1  CL 97* 89* 89* 96* 94*  CO2 31 33* 32 34* 35*  GLUCOSE 106*  --  156*  --   --   BUN 31* 10 26* 20 21  CREATININE 0.81 0.5 0.63 0.6 0.6  CALCIUM 9.0 9.3 9.5  --  9.2   Liver Function Tests: Recent Labs    01/27/22 1732 05/01/22 0000 11/03/22 0000  AST ALT ALKPHOS 65 84 74  BILITOT 0.7  --   --   PROT 6.8  --   --   ALBUMIN 3.8 4.1 4.2   No results for input(s): "LIPASE", "AMYLASE" in the last 8760 hours. No results for input(s): "AMMONIA" in the last 8760 hours. CBC: Recent Labs    01/27/22 1732 01/29/22 0534 06/26/22 1828 11/03/22 0000  WBC 20.5* 12.4* 9.2 6.5  NEUTROABS 18.5*  --  8.0* 5,116.00  HGB 12.9 10.8* 12.1 12.1  HCT 39.7 33.8* 37.6 36  MCV 98.3 100.0 98.4  --   PLT 217 164 268 310   Lipid Panel: Recent Labs    05/01/22 0000 11/03/22 0000  CHOL 175 181  HDL 64 74*  LDLCALC 91 88  TRIG 103 101   Lab Results  Component Value Date   HGBA1C 5.6 11/03/2022    Procedures since last visit: No results found.  Assessment/Plan There are no diagnoses linked to this encounter.   Labs/tests ordered:  * No order type specified * Next appt:  Visit date not found

## 2022-11-17 ENCOUNTER — Telehealth: Payer: Self-pay

## 2022-11-17 NOTE — Telephone Encounter (Signed)
Patient called about fluconazole. She says that she doesn't know what the medication is for. She says that her visit summary says that if she is still having symptoms Monday to call the office and to take fluconazole. I do not see these notations on the AVS. Please advise.  Message sent to Dr. [name]

## 2022-11-17 NOTE — Telephone Encounter (Signed)
Spoke with patient and she understood

## 2022-11-18 ENCOUNTER — Encounter: Payer: Medicare Other | Admitting: Nurse Practitioner

## 2022-11-20 ENCOUNTER — Telehealth: Payer: Self-pay | Admitting: Student

## 2022-11-20 NOTE — Telephone Encounter (Signed)
Called patient to notify her urine did not grow specific bacteria. Asked if she would be interested in medication to help with dysuria, declined. Patient has a urology appointment scheduled 5/9 with urology-- hope to have cystoscopy planning discussed at that time. All questions answered. ED RTC guidelines outlined. OV tomorrow for separate issue.

## 2022-11-21 ENCOUNTER — Telehealth: Payer: Self-pay

## 2022-11-21 ENCOUNTER — Ambulatory Visit: Payer: Medicare Other | Admitting: Student

## 2022-11-21 ENCOUNTER — Encounter: Payer: Self-pay | Admitting: Student

## 2022-11-21 VITALS — BP 114/70 | HR 74 | Temp 98.6°F | Ht <= 58 in | Wt 87.5 lb

## 2022-11-21 DIAGNOSIS — I7300 Raynaud's syndrome without gangrene: Secondary | ICD-10-CM | POA: Diagnosis not present

## 2022-11-21 DIAGNOSIS — I83811 Varicose veins of right lower extremities with pain: Secondary | ICD-10-CM

## 2022-11-21 NOTE — Patient Instructions (Addendum)
Once we have the results of your ultrasound we should resume compression stockings.   In the meantime, please go to [facility] to have your legs measured so you can already have those ordered.   Your UROLOGY appointment is Thursday, 5/9 at 2:15 PM, you must arrive by 2 PM.   Please take miralax Monday Wednesday and Friday mornings.   Please continue to take Senakot 2 tablets every night to help with your bowel movements.

## 2022-11-21 NOTE — Progress Notes (Signed)
Location:  [facility]   Place of Service:   [facility]  Provider: Sydnee Cabal  Code Status: Full Code Goals of Care:     11/14/2022    1:17 PM  Advanced Directives  Does Patient Have a Medical Advance Directive? Yes  Type of Estate agent of [place];Living will  Does patient want to make changes to medical advance directive? No - Patient declined  Copy of Healthcare Power of Attorney in Chart? No - copy requested     Chief Complaint  Patient presents with   Acute Visit    Right leg concerns, swelling and soreness.     HPI: Patient is a 89 y.o. female seen today for an acute visit for leg concerns.   She has a new wound on her leg-- it's been a couple of weeks right now. She has had bleeding and they are applying mupirocin cream on her legs. She was putting on her stockings and clawed her leg   Veins in her legs don't give her any pain today, however, nursing states she was just complaining about it yesterday.   Addendum: Received message from nurse after appointment that patient has had purple feet.   She is sitting on the toilet for a long time. Almost until noon.   Past Medical History:  Diagnosis Date   Cancer (HCC)    SKIN   COPD, mild (HCC)    Dyspnea    Edema    HLD (hyperlipidemia)    HTN (hypertension)    Hypothyroidism    Macular degeneration, bilateral    Varicosities     Past Surgical History:  Procedure Laterality Date   CATARACT EXTRACTION W/PHACO Right 02/03/2017   Procedure: CATARACT EXTRACTION PHACO AND INTRAOCULAR LENS PLACEMENT (IOC);  Surgeon: Galen Manila, MD;  Location: [facility] ORS;  Service: Ophthalmology;  Laterality: Right;  [place] 00:57.2 AP% 18.5 CDE 10.58 Fluid pack lot # [id number]   CATARACT EXTRACTION W/PHACO Left 02/24/2017   Procedure: CATARACT EXTRACTION PHACO AND INTRAOCULAR LENS PLACEMENT (IOC);  Surgeon: Galen Manila, MD;  Location: [facility] ORS;  Service: Ophthalmology;  Laterality: Left;  [place] 01:12 AP%  25.0 CDE 18.03 Fluid pack lot # [id number]   MOHS SURGERY  2015   nose   SALIVARY GLAND SURGERY Left 2012   TONSILLECTOMY  1940    Allergies  Allergen Reactions   Gentamicin    Levofloxacin Diarrhea    Outpatient Encounter Medications as of 11/21/2022  Medication Sig   ARIPiprazole (ABILIFY) 2 MG tablet Take 1 tablet (2 mg total) by mouth daily with breakfast.   calcium-vitamin D (OSCAL WITH D) 500-200 MG-UNIT tablet Take 1 tablet by mouth daily.   estradiol (ESTRACE) 0.1 MG/GM vaginal cream Place 1 Applicatorful vaginally 3 (three) times a week.   losartan (COZAAR) 50 MG tablet Take 1 tablet (50 mg total) by mouth daily.   lovastatin (MEVACOR) 20 MG tablet Take 1 tablet (20 mg total) by mouth at bedtime.   Multiple Vitamins-Minerals (PRESERVISION AREDS) TABS Take 1 capsule by mouth 2 (two) times daily.   Omega-3 Fatty Acids (FISH OIL CONCENTRATE PO) Take 1 tablet by mouth daily.    OXYQUINOLONE SULFATE VAGINAL 0.025 % GEL Place 1 Application vaginally 3 (three) times a week.   Polyvinyl Alcohol-Povidone PF (REFRESH) 1.4-0.6 % SOLN Place 1 drop into both eyes in the morning and at bedtime.   saccharomyces boulardii (FLORASTOR) 250 MG capsule Take 1 capsule (250 mg total) by mouth 2 (two) times daily.   [  DISCONTINUED] estradiol (ESTRACE) 0.1 MG/GM vaginal cream Place 1 Applicatorful vaginally 2 (two) times a week.   [DISCONTINUED] OXYQUINOLONE SULFATE VAGINAL 0.025 % GEL Place 1 application vaginally 2 (two) times a week.   No facility-administered encounter medications on file as of 11/21/2022.    Review of Systems:  Review of Systems  Health Maintenance  Topic Date Due   Medicare Annual Wellness (AWV)  Never done   DTaP/Tdap/Td (1 - Tdap) Never done   Zoster Vaccines- Shingrix (1 of 2) Never done   DEXA SCAN  Never done   COVID-19 Vaccine (1) 06/12/2020   INFLUENZA VACCINE  02/26/2023   Pneumonia Vaccine 13+ Years old  Completed   HPV VACCINES  Aged Out    Physical  Exam: Vitals:   11/21/22 1012  BP: 114/70  Pulse: 74  Temp: 98.6 F (37 C)  TempSrc: Temporal  SpO2: 96%  Weight: 87 lb 8 oz (39.7 kg)  Height: 4\' 10"  (1.473 m)   Body mass index is 18.29 kg/m. Physical Exam Constitutional:      Appearance: Normal appearance.  Cardiovascular:     Rate and Rhythm: Normal rate.     Pulses: Normal pulses.  Pulmonary:     Effort: Pulmonary effort is normal.  Musculoskeletal:     Right lower leg: Edema present.     Left lower leg: Edema present.     Comments: Large tortuous veins of bilateral legs.   Skin:    General: Skin is warm and dry.     Comments: Right leg with 2 healing lacerations. No surrounding erythema or purulent drainage.   Neurological:     Mental Status: She is alert and oriented to person, place, and time.     Labs reviewed: Basic Metabolic Panel: Recent Labs    01/27/22 1732 05/01/22 0000 06/26/22 1828 07/31/22 0000 11/03/22 0000  NA 134* 128* 130* 136* 134*  K 4.2 4.1 4.8 4.6 4.1  CL 97* 89* 89* 96* 94*  CO2 31 33* 32 34* 35*  GLUCOSE 106*  --  156*  --   --   BUN 31* 10 26* 20 21  CREATININE 0.81 0.5 0.63 0.6 0.6  CALCIUM 9.0 9.3 9.5  --  9.2   Liver Function Tests: Recent Labs    01/27/22 1732 05/01/22 0000 11/03/22 0000  AST 26 17 19   ALT 25 11 18   ALKPHOS 65 84 74  BILITOT 0.7  --   --   PROT 6.8  --   --   ALBUMIN 3.8 4.1 4.2   No results for input(s): "LIPASE", "AMYLASE" in the last 8760 hours. No results for input(s): "AMMONIA" in the last 8760 hours. CBC: Recent Labs    01/27/22 1732 01/29/22 0534 06/26/22 1828 11/03/22 0000  WBC 20.5* 12.4* 9.2 6.5  NEUTROABS 18.5*  --  8.0* 5,116.00  HGB 12.9 10.8* 12.1 12.1  HCT 39.7 33.8* 37.6 36  MCV 98.3 100.0 98.4  --   PLT 217 164 268 310   Lipid Panel: Recent Labs    05/01/22 0000 11/03/22 0000  CHOL 175 181  HDL 64 74*  LDLCALC 91 88  TRIG 103 101   Lab Results  Component Value Date   HGBA1C 5.6 11/03/2022    Procedures  since last visit: No results found.  Assessment/Plan Varicose veins of right lower extremity with pain - Plan: VAS [place] LOWER EXTREMITY VENOUS (DVT), CANCELED: VAS [place] LOWER EXTREMITY VENOUS (DVT)  Raynaud's phenomenon without gangrene - Plan: VAS [place]  ABI WITH/WO TBI Patient with large varicose veins that have caused pain periodically as recent as yesterday. Plan to investigate further for evidence of clot. In addition, will evaluate for blood flow to the feet as she has had worsening swelling and purple feet.    Labs/tests ordered:  * No order type specified * Next appt:  01/14/2023

## 2022-11-21 NOTE — Telephone Encounter (Signed)
As patient was leaving her visit today she was small talking with Janci, Independent Living nurse and stated that she forgot to tell Dr.Beamer that her feet were purple. Patients transportation was here to pick her up and she asked that we share this information with Dr.Beamer.

## 2022-11-24 NOTE — Telephone Encounter (Signed)
Call returned to patient and/or family member, a detailed message was left with the providers reply.   

## 2022-11-24 NOTE — Telephone Encounter (Signed)
Below is Dr.Beamer's response: Copied and pasted   [name], MD  You3 days ago   VB Thank you. Will adjust her imaging order to check on the blood flow to her feet as well.

## 2022-11-25 NOTE — Telephone Encounter (Signed)
error 

## 2022-11-25 NOTE — Telephone Encounter (Signed)
Patient returned call and message was explained. Patient states that she is unaware of any imgaing that is suppose to be done and she doesn't have an appointment regarding this. I do not see an order regarding the imaging that Is suppose to be done.  Patient also states that now she is having diarrhea. IT has been going on for about a week. She states that whenever she eats she has diarrhea. She has stopped taking any stool softeners.  Message sent to Dr. [name]

## 2022-11-27 ENCOUNTER — Other Ambulatory Visit: Payer: Self-pay

## 2022-11-27 DIAGNOSIS — I83811 Varicose veins of right lower extremities with pain: Secondary | ICD-10-CM

## 2022-11-27 DIAGNOSIS — I7300 Raynaud's syndrome without gangrene: Secondary | ICD-10-CM

## 2022-12-01 ENCOUNTER — Encounter (INDEPENDENT_AMBULATORY_CARE_PROVIDER_SITE_OTHER): Payer: Medicare Other

## 2022-12-02 NOTE — Progress Notes (Unsigned)
    GYNECOLOGY PROGRESS NOTE  Subjective:    [name], female    DOB: 1932/12/25, 89 y.o.   MRN: [id number]  HPI  Patient is a 89 y.o. G29P1001 female who presents for pessary maintenance. She has a history of cystocele, rectocele, complete procidentia, and incomplete bladder emptying. Also with significant vaginal atrophy.  She has been using the [place] gel only, but will use Premarin cream daily for 1 week prior to pessary check for ease of removal (refuses to use long-term due to concerns for risk of cancer).   {Common ambulatory SmartLinks:19316}  Review of Systems {ros; complete:30496}   Objective:   There were no vitals taken for this visit. There is no height or weight on file to calculate BMI. General appearance: {general exam:16600} Abdomen: {abdominal exam:16834} Pelvic: {pelvic exam:16852::"cervix normal in appearance","external genitalia normal","no adnexal masses or tenderness","no cervical motion tenderness","rectovaginal septum normal","uterus normal size, shape, and consistency","vagina normal without discharge"} Extremities: {extremity exam:5109} Neurologic: {neuro exam:17854}   Assessment:   No diagnosis found.   Plan:   There are no diagnoses linked to this encounter.   Hildred Laser, MD [facility]

## 2022-12-02 NOTE — Patient Instructions (Signed)
How to Use a Vaginal Pessary  A vaginal pessary is a removable device that is placed into your vagina to support pelvic organs that droop. These organs include your uterus, bladder, and rectum. When your pelvic organs drop down into your vagina, it causes a condition called pelvic organ prolapse (POP). A pessary may be an alternative to surgery for women with POP. It may help women who leak urine when they strain or exercise (stress incontinence). This is a symptom of POP. A vaginal pessary may also be a temporary treatment for stress incontinence during pregnancy. There are several types of pessaries. All types are usually made of silicone. You can insert and remove some on your own. Other types must be inserted and removed by your health care provider at office visits. The reason you are using a pessary and the severity of your condition will determine which one is best for you. It is also important to find the right size. A pessary that is too small may fall out. A pessary that is too large may cause pain or discomfort. Your health care provider will do a physical exam to find the correct size and fit for your pessary. It may take several appointments to find the best fit for you. If you can be fit with the type of pessary that you can insert, remove, and clean yourself, your health care provider will teach you how to use your pessary at home. You may have checkups every few months. If you have the type of pessary that needs to be inserted and removed by your health care provider, you will have appointments every few months to have the pessary removed, cleaned, and replaced. What are the risks? When properly fitted and cared for, risks of using a vaginal pessary can be small. However, there can be problems that may include: Vaginal discharge. Vaginal bleeding. A bad smell coming from your vagina. Scraping of the skin inside your vagina. How to use your pessary Follow your health care provider's  instructions for using a pessary. These instructions may vary, depending on the type of pessary you have. To insert a pessary: Wash your hands with soap and water for at least 20 seconds. Squeeze or fold the pessary in half and lubricate the tip with a water-based lubricant. Insert the pessary into your vagina. It will unfold and provide support. To remove the pessary, gently tug it out of your vagina. You can remove the pessary every night or after several days. You can also remove it to have sex. How to care for your pessary If you have a pessary that you can remove: Clean your pessary with soap and water. Rinse well. Dry it completely before inserting it back into your vagina. Follow these instructions at home: Take over-the-counter and prescription medicines only as told by your health care provider. Your health care provider may prescribe an estrogen cream to moisten your vagina. Keep all follow-up visits. This is important. Contact a health care provider if: You feel any pain or discomfort when your pessary is in place. You continue to have stress incontinence. You have trouble keeping your pessary from falling out. You have an unusual vaginal discharge that is blood-tinged or smells bad. Summary A vaginal pessary is a removable device that is placed into your vagina to support pelvic organs that droop. This condition is called pelvic organ prolapse (POP). There are several types of pessaries. Some you can insert and remove on your own. Others must be inserted and   removed by your health care provider. The best type for you depends on the reason you are using a pessary and the severity of your condition. It is also important to find the right size. If you can use the type that you insert and remove on your own, your health care provider will teach you how to use it and schedule checkups every few months. If you have the type that needs to be inserted and removed by your health care  provider, you will have regular appointments to have your pessary removed, cleaned, and replaced. This information is not intended to replace advice given to you by your health care provider. Make sure you discuss any questions you have with your health care provider. Document Revised: 01/12/2020 Document Reviewed: 01/12/2020 Elsevier Patient Education  2023 Elsevier Inc.  

## 2022-12-03 ENCOUNTER — Encounter: Payer: Self-pay | Admitting: Obstetrics and Gynecology

## 2022-12-03 ENCOUNTER — Ambulatory Visit (INDEPENDENT_AMBULATORY_CARE_PROVIDER_SITE_OTHER): Payer: Medicare Other | Admitting: Obstetrics and Gynecology

## 2022-12-03 VITALS — BP 128/62 | HR 78 | Resp 16 | Ht <= 58 in | Wt 86.4 lb

## 2022-12-03 DIAGNOSIS — Z4689 Encounter for fitting and adjustment of other specified devices: Secondary | ICD-10-CM | POA: Diagnosis not present

## 2022-12-03 DIAGNOSIS — N813 Complete uterovaginal prolapse: Secondary | ICD-10-CM

## 2022-12-03 DIAGNOSIS — N950 Postmenopausal bleeding: Secondary | ICD-10-CM

## 2022-12-03 DIAGNOSIS — N952 Postmenopausal atrophic vaginitis: Secondary | ICD-10-CM | POA: Diagnosis not present

## 2022-12-04 ENCOUNTER — Ambulatory Visit: Payer: Medicare Other | Admitting: Urology

## 2022-12-04 ENCOUNTER — Encounter: Payer: Self-pay | Admitting: Urology

## 2022-12-04 VITALS — BP 169/79 | HR 76 | Ht <= 58 in | Wt 87.0 lb

## 2022-12-04 DIAGNOSIS — N390 Urinary tract infection, site not specified: Secondary | ICD-10-CM

## 2022-12-04 DIAGNOSIS — Z87440 Personal history of urinary (tract) infections: Secondary | ICD-10-CM | POA: Diagnosis not present

## 2022-12-04 DIAGNOSIS — N3090 Cystitis, unspecified without hematuria: Secondary | ICD-10-CM

## 2022-12-04 LAB — BLADDER SCAN AMB NON-IMAGING: Scan Result: 0

## 2022-12-04 NOTE — Progress Notes (Signed)
Marcelle Overlie Plume,acting as a scribe for Riki Altes, MD.,have documented all relevant documentation on the behalf of Riki Altes, MD,as directed by  Riki Altes, MD while in the presence of Riki Altes, MD.  12/04/2022 2:30 PM   Angela Mason Feb 27, 1933 [id number]  Referring provider: [name], MD [address] [place],  [place] [address]  Chief Complaint  Patient presents with   Recurrent UTI    HPI: Angela Mason is a 89 y.o. female who is referred for evaluation of recurrent UTI's.  Positive E. Coli cultures in February, early March, and late March Her most bothersome symptom is dysuria States that she has had multiple antibiotic courses and will typically get improvement. However, symptoms will return Denies a history of diverticulitis ? history of pneumaturia She has uterine prolapse, managed with a pessary and followed by GYN Uses premarin cream twice weekly No febrile UTI's or polynephritis Denies gross hematuria   PMH: Past Medical History:  Diagnosis Date   Cancer (HCC)    SKIN   COPD, mild (HCC)    Dyspnea    Edema    HLD (hyperlipidemia)    HTN (hypertension)    Hypothyroidism    Macular degeneration, bilateral    Varicosities     Surgical History: Past Surgical History:  Procedure Laterality Date   CATARACT EXTRACTION W/PHACO Right 02/03/2017   Procedure: CATARACT EXTRACTION PHACO AND INTRAOCULAR LENS PLACEMENT (IOC);  Surgeon: Galen Manila, MD;  Location: [facility] ORS;  Service: Ophthalmology;  Laterality: Right;  [place] 00:57.2 AP% 18.5 CDE 10.58 Fluid pack lot # [id number]   CATARACT EXTRACTION W/PHACO Left 02/24/2017   Procedure: CATARACT EXTRACTION PHACO AND INTRAOCULAR LENS PLACEMENT (IOC);  Surgeon: Galen Manila, MD;  Location: [facility] ORS;  Service: Ophthalmology;  Laterality: Left;  [place] 01:12 AP% 25.0 CDE 18.03 Fluid pack lot # [id number]   MOHS SURGERY  2015   nose   SALIVARY GLAND SURGERY Left 2012   TONSILLECTOMY   1940    Home Medications:  Allergies as of 12/04/2022       Reactions   Gentamicin    Levofloxacin Diarrhea        Medication List        Accurate as of Dec 04, 2022  2:30 PM. If you have any questions, ask your nurse or doctor.          ARIPiprazole 2 MG tablet Commonly known as: Abilify Take 1 tablet (2 mg total) by mouth daily with breakfast.   calcium-vitamin D 500-200 MG-UNIT tablet Commonly known as: OSCAL WITH D Take 1 tablet by mouth daily.   estradiol 0.1 MG/GM vaginal cream Commonly known as: ESTRACE Place 1 Applicatorful vaginally 3 (three) times a week.   FISH OIL CONCENTRATE PO Take 1 tablet by mouth daily.   losartan 50 MG tablet Commonly known as: COZAAR Take 1 tablet (50 mg total) by mouth daily.   lovastatin 20 MG tablet Commonly known as: MEVACOR Take 1 tablet (20 mg total) by mouth at bedtime.   OXYQUINOLONE SULFATE VAGINAL 0.025 % Gel Place 1 Application vaginally 3 (three) times a week.   PreserVision AREDS Tabs Take 1 capsule by mouth 2 (two) times daily.   Refresh 1.4-0.6 % Soln Generic drug: Polyvinyl Alcohol-Povidone PF Place 1 drop into both eyes in the morning and at bedtime.        Allergies:  Allergies  Allergen Reactions   Gentamicin    Levofloxacin Diarrhea    Family  History: Family History  Problem Relation Age of Onset   Diverticulitis Mother    Macular degeneration Mother    COPD Father    Heart failure Father    Epilepsy Sister    Parkinson's disease Brother    Cancer Neg Hx    Diabetes Neg Hx    Breast cancer Neg Hx     Social History:  reports that she quit smoking about 17 years ago. Her smoking use included cigarettes. She has never used smokeless tobacco. She reports that she does not currently use alcohol. She reports that she does not use drugs.   Physical Exam: BP (!) 169/79   Pulse 76   Ht 4\' 10"  (1.473 m)   Wt 87 lb (39.5 kg)   BMI 18.18 kg/m   Constitutional:  Alert and oriented, No  acute distress. HEENT: [place] AT Respiratory: Normal respiratory effort, no increased work of breathing. Skin: No rashes, bruises or suspicious lesions. Neurologic: Grossly intact, no focal deficits, moving all 4 extremities. Psychiatric: Normal mood and affect.  Laboratory Data:  Unable to provide a urine specimen today and bladder scan for volume was zero mL.   Assessment & Plan:    1. Recurrent UTI Meets AUA criteria for recurrent UTI's Multiple recurrent E. Coli infections; questionable history of pneumaturia Schedule CT abdomen pelvis and cystoscopy for further evaluation Drop off urine tomorrow for UA/culture  I have reviewed the above documentation for accuracy and completeness, and I agree with the above.   Riki Altes, MD  [facility] [address] [place], [place] [address] [phone]

## 2022-12-05 ENCOUNTER — Encounter: Payer: Self-pay | Admitting: Urology

## 2022-12-05 ENCOUNTER — Ambulatory Visit (INDEPENDENT_AMBULATORY_CARE_PROVIDER_SITE_OTHER): Payer: Medicare Other

## 2022-12-05 ENCOUNTER — Other Ambulatory Visit: Payer: Medicare Other

## 2022-12-05 DIAGNOSIS — I7300 Raynaud's syndrome without gangrene: Secondary | ICD-10-CM

## 2022-12-05 DIAGNOSIS — I83811 Varicose veins of right lower extremities with pain: Secondary | ICD-10-CM | POA: Diagnosis not present

## 2022-12-05 LAB — MICROSCOPIC EXAMINATION: WBC, UA: >30 /hpf — AB (ref 0–5)

## 2022-12-05 LAB — URINALYSIS, COMPLETE
Bilirubin, UA: NEGATIVE
Glucose, UA: NEGATIVE
Ketones, UA: NEGATIVE
Nitrite, UA: NEGATIVE
Protein,UA: NEGATIVE
Specific Gravity, UA: 1.01 (ref 1.005–1.030)
Urobilinogen, Ur: 0.2 mg/dL (ref 0.2–1.0)
pH, UA: 5.5 (ref 5.0–7.5)

## 2022-12-10 ENCOUNTER — Encounter: Payer: Self-pay | Admitting: Student

## 2022-12-10 LAB — VAS US ABI WITH/WO TBI
Left ABI: 1.1
Right ABI: 1.24

## 2022-12-10 NOTE — Progress Notes (Signed)
Patient's results for ABI and Doppler- negative.

## 2022-12-12 ENCOUNTER — Ambulatory Visit
Admission: RE | Admit: 2022-12-12 | Discharge: 2022-12-12 | Disposition: A | Discharge status: Home / Self Care | Payer: Medicare Other | Source: Ambulatory Visit | Attending: Urology | Admitting: Urology

## 2022-12-12 DIAGNOSIS — N390 Urinary tract infection, site not specified: Secondary | ICD-10-CM | POA: Diagnosis present

## 2022-12-12 DIAGNOSIS — N3090 Cystitis, unspecified without hematuria: Secondary | ICD-10-CM | POA: Diagnosis present

## 2022-12-15 ENCOUNTER — Other Ambulatory Visit: Payer: Medicare Other

## 2023-01-14 ENCOUNTER — Encounter: Payer: Self-pay | Admitting: Student

## 2023-01-14 ENCOUNTER — Ambulatory Visit: Payer: Medicare Other | Admitting: Student

## 2023-01-14 VITALS — BP 138/88 | HR 74 | Temp 98.1°F | Ht <= 58 in | Wt 84.0 lb

## 2023-01-14 DIAGNOSIS — E43 Unspecified severe protein-calorie malnutrition: Secondary | ICD-10-CM

## 2023-01-14 DIAGNOSIS — K5901 Slow transit constipation: Secondary | ICD-10-CM | POA: Diagnosis not present

## 2023-01-14 DIAGNOSIS — Z789 Other specified health status: Secondary | ICD-10-CM | POA: Diagnosis not present

## 2023-01-14 DIAGNOSIS — N390 Urinary tract infection, site not specified: Secondary | ICD-10-CM | POA: Diagnosis not present

## 2023-01-14 NOTE — Patient Instructions (Addendum)
Please continue taking miralax 1/2 capful daily.   Take Senna when needed if you have not had a bowel movement for 2 days. Otherwise, just take the   You are due for your shingles (Shingrix) vaccine and your tetanus ( Tdap).   Please go to you [facility] or [facility] to have these vaccines done.

## 2023-01-14 NOTE — Progress Notes (Unsigned)
Location:  [facility] clinic [facility].   Provider: Dr. [name]  Code Status: Full Code Goals of Care:     01/14/2023    1:48 PM  Advanced Directives  Does Patient Have a Medical Advance Directive? No  Does patient want to make changes to medical advance directive? No - Patient declined     Chief Complaint  Patient presents with   Medical Management of Chronic Issues    Medical Management of Chronic Issues. 2 Month Follow up   Quality Metric Gaps    To discuss need for AWV,Tdap,Zoster and Covid Vaccine.     HPI: Patient is a 89 y.o. female seen today for medical management of chronic diseases.    She has been getting mail from insurance about processing  She stopped her senna for three days and then restarted it last night. She was having normal bowel movements. She hasn't really been constipated. She had a bowel movement for the 3d without the medication. She is still taking miralax 1/2 capful every other day.  The diarrhea kept coming and coming.   She has the boost in the morning but not later in the day.   She is moving to [employer] - on the wiat list. Her daugther will be there on Friday and help with her Transition.   Past Medical History:  Diagnosis Date   Cancer (HCC)    SKIN   COPD, mild (HCC)    Dyspnea    Edema    HLD (hyperlipidemia)    HTN (hypertension)    Hypothyroidism    Macular degeneration, bilateral    Varicosities     Past Surgical History:  Procedure Laterality Date   CATARACT EXTRACTION W/PHACO Right 02/03/2017   Procedure: CATARACT EXTRACTION PHACO AND INTRAOCULAR LENS PLACEMENT (IOC);  Surgeon: Galen Manila, MD;  Location: [facility] ORS;  Service: Ophthalmology;  Laterality: Right;  [place] 00:57.2 AP% 18.5 CDE 10.58 Fluid pack lot # [id number]   CATARACT EXTRACTION W/PHACO Left 02/24/2017   Procedure: CATARACT EXTRACTION PHACO AND INTRAOCULAR LENS PLACEMENT (IOC);  Surgeon: Galen Manila, MD;  Location: [facility] ORS;  Service:  Ophthalmology;  Laterality: Left;  [place] 01:12 AP% 25.0 CDE 18.03 Fluid pack lot # [id number]   MOHS SURGERY  2015   nose   SALIVARY GLAND SURGERY Left 2012   TONSILLECTOMY  1940    Allergies  Allergen Reactions   Gentamicin    Levofloxacin Diarrhea    Outpatient Encounter Medications as of 01/14/2023  Medication Sig   ARIPiprazole (ABILIFY) 2 MG tablet Take 1 tablet (2 mg total) by mouth daily with breakfast.   calcium-vitamin D (OSCAL WITH D) 500-200 MG-UNIT tablet Take 1 tablet by mouth daily.   estradiol (ESTRACE) 0.1 MG/GM vaginal cream Place 1 Applicatorful vaginally 3 (three) times a week.   losartan (COZAAR) 50 MG tablet Take 1 tablet (50 mg total) by mouth daily.   lovastatin (MEVACOR) 20 MG tablet Take 1 tablet (20 mg total) by mouth at bedtime.   Multiple Vitamins-Minerals (PRESERVISION AREDS) TABS Take 1 capsule by mouth 2 (two) times daily.   Omega-3 Fatty Acids (FISH OIL CONCENTRATE PO) Take 1 tablet by mouth daily.    OXYQUINOLONE SULFATE VAGINAL 0.025 % GEL Place 1 Application vaginally 3 (three) times a week.   Polyvinyl Alcohol-Povidone PF (REFRESH) 1.4-0.6 % SOLN Place 1 drop into both eyes in the morning and at bedtime.   No facility-administered encounter medications on file as of 01/14/2023.    Review of  Systems:  Review of Systems  Health Maintenance  Topic Date Due   Medicare Annual Wellness (AWV)  Never done   DTaP/Tdap/Td (1 - Tdap) Never done   Zoster Vaccines- Shingrix (1 of 2) Never done   DEXA SCAN  Never done   COVID-19 Vaccine (1) 06/12/2020   INFLUENZA VACCINE  02/26/2023   Pneumonia Vaccine 37+ Years old  Completed   HPV VACCINES  Aged Out    Physical Exam: Vitals:   01/14/23 1346  BP: 138/88  Pulse: 74  Temp: 98.1 F (36.7 C)  SpO2: 96%  Weight: 84 lb (38.1 kg)  Height: 4\' 10"  (1.473 m)   Body mass index is 17.56 kg/m. Physical Exam Vitals reviewed.  Constitutional:      Comments: Thin, muscle wasting of temporal bones   Cardiovascular:     Rate and Rhythm: Normal rate and regular rhythm.     Pulses: Normal pulses.     Heart sounds: Normal heart sounds.  Pulmonary:     Effort: Pulmonary effort is normal.  Neurological:     Mental Status: She is alert and oriented to person, place, and time.  Psychiatric:        Behavior: Behavior normal.     Labs reviewed: Basic Metabolic Panel: Recent Labs    01/27/22 1732 05/01/22 0000 06/26/22 1828 07/31/22 0000 11/03/22 0000  NA 134* 128* 130* 136* 134*  K 4.2 4.1 4.8 4.6 4.1  CL 97* 89* 89* 96* 94*  CO2 31 33* 32 34* 35*  GLUCOSE 106*  --  156*  --   --   BUN 31* 10 26* 20 21  CREATININE 0.81 0.5 0.63 0.6 0.6  CALCIUM 9.0 9.3 9.5  --  9.2   Liver Function Tests: Recent Labs    01/27/22 1732 05/01/22 0000 11/03/22 0000  AST 26 17 19   ALT 25 11 18   ALKPHOS 65 84 74  BILITOT 0.7  --   --   PROT 6.8  --   --   ALBUMIN 3.8 4.1 4.2   No results for input(s): "LIPASE", "AMYLASE" in the last 8760 hours. No results for input(s): "AMMONIA" in the last 8760 hours. CBC: Recent Labs    01/27/22 1732 01/29/22 0534 06/26/22 1828 11/03/22 0000  WBC 20.5* 12.4* 9.2 6.5  NEUTROABS 18.5*  --  8.0* 5,116.00  HGB 12.9 10.8* 12.1 12.1  HCT 39.7 33.8* 37.6 36  MCV 98.3 100.0 98.4  --   PLT 217 164 268 310   Lipid Panel: Recent Labs    05/01/22 0000 11/03/22 0000  CHOL 175 181  HDL 64 74*  LDLCALC 91 88  TRIG 103 101   Lab Results  Component Value Date   HGBA1C 5.6 11/03/2022    Procedures since last visit: No results found.  Assessment/Plan 1. Constipation by delayed colonic transit Miralax daily and change senna to PRN for constipation  2. Protein-calorie malnutrition, severe (HCC) Down 2 lbs. Discussed importance of protein supplementation. Continue BID protein supplementation  3. Recurrent UTI (urinary tract infection) Scheduled for cystoscopy tomorrow. Denies dysuria at this time.   4. Deficit with ADLs [place] once her daughter can come to town to aid with transition  Labs/tests ordered:  * No order type specified * Next appt:  Visit date not found

## 2023-01-15 ENCOUNTER — Encounter: Payer: Self-pay | Admitting: Urology

## 2023-01-15 ENCOUNTER — Ambulatory Visit: Payer: Medicare Other | Admitting: Urology

## 2023-01-15 VITALS — BP 138/88 | HR 74 | Ht 60.0 in | Wt 84.0 lb

## 2023-01-15 DIAGNOSIS — N3090 Cystitis, unspecified without hematuria: Secondary | ICD-10-CM

## 2023-01-15 DIAGNOSIS — N390 Urinary tract infection, site not specified: Secondary | ICD-10-CM

## 2023-01-15 LAB — MICROSCOPIC EXAMINATION

## 2023-01-15 LAB — BLADDER SCAN AMB NON-IMAGING: Scan Result: 122

## 2023-01-15 LAB — URINALYSIS, COMPLETE
Bilirubin, UA: NEGATIVE
Glucose, UA: NEGATIVE
Nitrite, UA: POSITIVE — AB
Protein,UA: NEGATIVE
Specific Gravity, UA: 1.015 (ref 1.005–1.030)
Urobilinogen, Ur: 0.2 mg/dL (ref 0.2–1.0)
pH, UA: 6 (ref 5.0–7.5)

## 2023-01-15 NOTE — Progress Notes (Signed)
In and Out Catheterization  Patient is present today for a I & O catheterization due to retention . Patient was cleaned and prepped in a sterile fashion with betadine . A 14FR cath was inserted no complications were noted , of urine return was noted, urine was yellow  in color. A clean urine sample was collected for ua . Bladder was drained  And catheter was removed with out difficulty.    Performed by: Ples Specter CmMA

## 2023-01-15 NOTE — Progress Notes (Signed)
[name], female   DOB: May 08, 1933, 89 y.o.   MRN: [id number]  Told for cystoscopy today.  CT showed no significant upper tract abnormalities the bladder was distended.  She was unable to void and bladder scan with estimated volume of 130 mL.  She was catheterized and UA was nitrite positive with 11-30 WBC.  Urine culture ordered and cystoscopy was rescheduled.  Will await urine culture results and keep on low-dose antibiotic until her rescheduled cystoscopy appointment

## 2023-01-16 ENCOUNTER — Telehealth: Payer: Self-pay | Admitting: *Deleted

## 2023-01-16 NOTE — Telephone Encounter (Signed)
Pt's friend Okey Regal calling asking why pt was not put on abx for her UTI, I advised friend that we would need to wait until the culture returns to know which abx to put her on. Okey Regal was concerned that she was positive back in May and why abx wasn't prescribed? I advised she had blood and Dr. Lonna Cobb ordered a CT.

## 2023-01-17 ENCOUNTER — Other Ambulatory Visit: Payer: Self-pay | Admitting: Student

## 2023-01-17 DIAGNOSIS — I10 Essential (primary) hypertension: Secondary | ICD-10-CM

## 2023-01-18 LAB — CULTURE, URINE COMPREHENSIVE

## 2023-01-22 ENCOUNTER — Other Ambulatory Visit: Payer: Self-pay

## 2023-01-22 LAB — CULTURE, URINE COMPREHENSIVE

## 2023-01-22 MED ORDER — TRIMETHOPRIM 100 MG PO TABS: 100.0000 mg | ORAL_TABLET | Freq: Every day | ORAL | 1 refills | Status: DC

## 2023-01-22 MED ORDER — AMOXICILLIN 875 MG PO TABS: 875.0000 mg | ORAL_TABLET | Freq: Two times a day (BID) | ORAL | 0 refills | Status: DC

## 2023-01-30 ENCOUNTER — Ambulatory Visit: Payer: Medicare Other | Admitting: Student

## 2023-01-30 ENCOUNTER — Encounter: Payer: Self-pay | Admitting: Student

## 2023-01-30 VITALS — BP 132/84 | HR 83 | Temp 98.7°F | Ht 60.0 in | Wt 83.0 lb

## 2023-01-30 DIAGNOSIS — F323 Major depressive disorder, single episode, severe with psychotic features: Secondary | ICD-10-CM

## 2023-01-30 DIAGNOSIS — F331 Major depressive disorder, recurrent, moderate: Secondary | ICD-10-CM

## 2023-01-30 DIAGNOSIS — E43 Unspecified severe protein-calorie malnutrition: Secondary | ICD-10-CM

## 2023-01-30 DIAGNOSIS — K5901 Slow transit constipation: Secondary | ICD-10-CM

## 2023-01-30 DIAGNOSIS — Z789 Other specified health status: Secondary | ICD-10-CM | POA: Diagnosis not present

## 2023-01-30 MED ORDER — ARIPIPRAZOLE 2 MG PO TABS
2.00 mg | ORAL_TABLET | Freq: Every day | ORAL | 0 refills | Status: DC
Start: 2023-01-30 — End: 2023-01-31

## 2023-01-30 NOTE — Patient Instructions (Signed)
Please order a "Squatty Potty" to see if that could help with your bowel movements.   Please restart taking your abilify daily.

## 2023-01-30 NOTE — Progress Notes (Signed)
Location:  [facility] [place] [facility] Provider: Sydnee Cabal  Code Status: Full Code Goals of Care:     01/14/2023    1:48 PM  Advanced Directives  Does Patient Have a Medical Advance Directive? No  Does patient want to make changes to medical advance directive? No - Patient declined     Chief Complaint  Patient presents with   Acute Visit    Discuss bowel concerns related to metabolism of antibiotic. Discuss Abilify and refill on blood pressure medication. Discuss if patient should be on calcium supplement. Here with daughter.    HPI: Patient is a 89 y.o. female seen today for medical management of chronic diseases.    She states she is worried about when to take her antibiotic because it may "run right through me." When asked for clarification she states, "it may not be able to do it's job, you know." As long as she is on the toilet. She shows on the bristol chart her stools are 4.   Will be on the toilet for hours multiple times of the day. It was "taking over her day."   She also sees a little blood when she wiped-- likely from straining.   Miralax - She is taking it Monday Wednesday Friday and is not taking the senna.   She has not been taking her abilify because she has been so anxious about her move.   Her daughter is with her today and aids in history.   Past Medical History:  Diagnosis Date   Cancer (HCC)    SKIN   COPD, mild (HCC)    Dyspnea    Edema    HLD (hyperlipidemia)    HTN (hypertension)    Hypothyroidism    Macular degeneration, bilateral    Varicosities     Past Surgical History:  Procedure Laterality Date   CATARACT EXTRACTION W/PHACO Right 02/03/2017   Procedure: CATARACT EXTRACTION PHACO AND INTRAOCULAR LENS PLACEMENT (IOC);  Surgeon: Galen Manila, MD;  Location: [facility] ORS;  Service: Ophthalmology;  Laterality: Right;  [place] 00:57.2 AP% 18.5 CDE 10.58 Fluid pack lot # [id number]   CATARACT EXTRACTION W/PHACO Left 02/24/2017   Procedure: CATARACT  EXTRACTION PHACO AND INTRAOCULAR LENS PLACEMENT (IOC);  Surgeon: Galen Manila, MD;  Location: [facility] ORS;  Service: Ophthalmology;  Laterality: Left;  [place] 01:12 AP% 25.0 CDE 18.03 Fluid pack lot # [id number]   MOHS SURGERY  2015   nose   SALIVARY GLAND SURGERY Left 2012   TONSILLECTOMY  1940    Allergies  Allergen Reactions   Gentamicin    Levofloxacin Diarrhea    Outpatient Encounter Medications as of 01/30/2023  Medication Sig   estradiol (ESTRACE) 0.1 MG/GM vaginal cream Place 1 Applicatorful vaginally 2 (two) times a week.   losartan (COZAAR) 50 MG tablet Take 1 tablet (50 mg total) by mouth daily.   lovastatin (MEVACOR) 20 MG tablet Take 1 tablet (20 mg total) by mouth at bedtime.   Multiple Vitamins-Minerals (PRESERVISION AREDS) TABS Take 1 capsule by mouth 2 (two) times daily.   Omega-3 Fatty Acids (FISH OIL CONCENTRATE PO) Take 1 tablet by mouth daily.    Polyvinyl Alcohol-Povidone PF (REFRESH) 1.4-0.6 % SOLN Place 1 drop into both eyes in the morning and at bedtime.   trimethoprim (TRIMPEX) 100 MG tablet Take 1 tablet (100 mg total) by mouth daily. Take this med AFTER completion of Amoxicillin   ARIPiprazole (ABILIFY) 2 MG tablet Take 1 tablet (2 mg total) by mouth daily with breakfast. (  Patient not taking: Reported on 01/30/2023)   calcium-vitamin D (OSCAL WITH D) 500-200 MG-UNIT tablet Take 1 tablet by mouth daily. (Patient not taking: Reported on 01/30/2023)   [DISCONTINUED] amoxicillin (AMOXIL) 875 MG tablet Take 1 tablet (875 mg total) by mouth every 12 (twelve) hours. Complete  this medication  BEFORE you start Trimethoprim.   [DISCONTINUED] OXYQUINOLONE SULFATE VAGINAL 0.025 % GEL Place 1 Application vaginally 3 (three) times a week.   No facility-administered encounter medications on file as of 01/30/2023.    Review of Systems:  Review of Systems  Health Maintenance  Topic Date Due   Medicare Annual Wellness (AWV)  Never done   DTaP/Tdap/Td (1 - Tdap) Never done    Zoster Vaccines- Shingrix (1 of 2) Never done   DEXA SCAN  Never done   COVID-19 Vaccine (1) 06/12/2020   INFLUENZA VACCINE  02/26/2023   Pneumonia Vaccine 40+ Years old  Completed   HPV VACCINES  Aged Out    Physical Exam: Vitals:   01/30/23 1521  BP: 132/84  Pulse: 83  Temp: 98.7 F (37.1 C)  TempSrc: Temporal  SpO2: 93%  Weight: 83 lb (37.6 kg)  Height: 5' (1.524 m)   Body mass index is 16.21 kg/m. Physical Exam Vitals reviewed.  Constitutional:      Appearance: Normal appearance.  Cardiovascular:     Rate and Rhythm: Normal rate.     Pulses: Normal pulses.  Pulmonary:     Effort: Pulmonary effort is normal.  Abdominal:     General: Abdomen is flat.     Palpations: Abdomen is soft.  Musculoskeletal:     Right lower leg: No edema.     Left lower leg: No edema.  Neurological:     Mental Status: She is alert.  Psychiatric:     Comments: Abnormal judgement and thought content     Labs reviewed: Basic Metabolic Panel: Recent Labs    05/01/22 0000 06/26/22 1828 07/31/22 0000 11/03/22 0000  NA 128* 130* 136* 134*  K 4.1 4.8 4.6 4.1  CL 89* 89* 96* 94*  CO2 33* 32 34* 35*  GLUCOSE  --  156*  --   --   BUN 10 26* 20 21  CREATININE 0.5 0.63 0.6 0.6  CALCIUM 9.3 9.5  --  9.2   Liver Function Tests: Recent Labs    05/01/22 0000 11/03/22 0000  AST 17 19  ALT 11 18  ALKPHOS 84 74  ALBUMIN 4.1 4.2   No results for input(s): "LIPASE", "AMYLASE" in the last 8760 hours. No results for input(s): "AMMONIA" in the last 8760 hours. CBC: Recent Labs    06/26/22 1828 11/03/22 0000  WBC 9.2 6.5  NEUTROABS 8.0* 5,116.00  HGB 12.1 12.1  HCT 37.6 36  MCV 98.4  --   PLT 268 310   Lipid Panel: Recent Labs    05/01/22 0000 11/03/22 0000  CHOL 175 181  HDL 64 74*  LDLCALC 91 88  TRIG 103 101   Lab Results  Component Value Date   HGBA1C 5.6 11/03/2022    Procedures since last visit: No results found.  Assessment/Plan Severe major depression,  single episode, with psychotic features (HCC) - Plan: ARIPiprazole (ABILIFY) 2 MG tablet  Constipation by delayed colonic transit  Protein-calorie malnutrition, severe (HCC)  Deficit in activities of daily living (ADL) Patient here today for reassurance and to restart her antipsychotic therapy. She [place] preparing to [place] to [place] and has significant anxiety. She lacks judgement at  this time, however, this is likely secondary to decompensation of underlying depression due to medication nonadherence. Constipation well-controlled, will trial "squatty potty" to see if that can help with how easily she has a bowel movement. Continue efforts of high protein diet.   Labs/tests ordered:  * No order type specified * Next appt:  04/17/2023

## 2023-01-31 MED ORDER — ARIPIPRAZOLE 2 MG PO TABS
2.0000 mg | ORAL_TABLET | Freq: Every day | ORAL | 0 refills | Status: AC
Start: 2023-01-31 — End: ?

## 2023-02-04 ENCOUNTER — Telehealth: Payer: Self-pay

## 2023-02-04 NOTE — Telephone Encounter (Signed)
Patient's daughter called inquiring about shingles vaccine and how to go about getting it. She states that she fells that her mother needs the vaccine. She was told by her insurance company that they do not cover the cost of this vaccine and would like to  know if there is something that can be done so she will not have to pay for it.  Message sent to Dr. [name]

## 2023-02-06 NOTE — Telephone Encounter (Signed)
Spoke with patient's daughter and she stated that [facility]. She understands now that she does not have the medicare plan D that will cover vaccinations.

## 2023-02-25 ENCOUNTER — Ambulatory Visit (INDEPENDENT_AMBULATORY_CARE_PROVIDER_SITE_OTHER): Payer: Medicare Other | Admitting: Urology

## 2023-02-25 DIAGNOSIS — R300 Dysuria: Secondary | ICD-10-CM | POA: Diagnosis not present

## 2023-02-25 DIAGNOSIS — N390 Urinary tract infection, site not specified: Secondary | ICD-10-CM

## 2023-02-25 DIAGNOSIS — N3090 Cystitis, unspecified without hematuria: Secondary | ICD-10-CM

## 2023-02-25 LAB — URINALYSIS, COMPLETE
Bilirubin, UA: NEGATIVE
Glucose, UA: NEGATIVE
Ketones, UA: NEGATIVE
Nitrite, UA: NEGATIVE
Protein,UA: NEGATIVE
Specific Gravity, UA: 1.01 (ref 1.005–1.030)
Urobilinogen, Ur: 0.2 mg/dL (ref 0.2–1.0)
pH, UA: 5.5 (ref 5.0–7.5)

## 2023-02-25 LAB — MICROSCOPIC EXAMINATION
Epithelial Cells (non renal): >10 /hpf — AB (ref 0–10)
WBC, UA: >30 /hpf — AB (ref 0–5)

## 2023-02-25 MED ORDER — CEFUROXIME AXETIL 250 MG PO TABS
250.0000 mg | ORAL_TABLET | Freq: Two times a day (BID) | ORAL | 0 refills | Status: DC
Start: 2023-02-25 — End: 2023-02-25

## 2023-02-25 MED ORDER — URIBEL 118 MG PO CAPS: 1.0000 | ORAL_CAPSULE | Freq: Every day | ORAL | 0 refills | Status: DC

## 2023-02-28 NOTE — Progress Notes (Signed)
   02/28/23  CC:  Chief Complaint  Patient presents with   Cysto    HPI: Initially evaluated for recurrent UTI and chronic dysuria.  Has had 2 cystoscopies rescheduled secondary to pyuria.  She was kept on low-dose antibiotic prophylaxis and urinalysis today shows >30 WBC however there are >10 epithelial cells.  Over the last month after treatment of UTI and on low-dose antibiotic she concerns of persistent dysuria   Cystoscopy Procedure Note  Patient identification was confirmed, informed consent was obtained, and patient was prepped using Betadine solution.  Lidocaine jelly was administered per urethral meatus.    Procedure: - Flexible cystoscope introduced, without any difficulty.   - Thorough search of the bladder revealed:    normal urethral meatus    normal urothelium    no stones    no ulcers     no tumors    no urethral polyps    no trabeculation  - Ureteral orifices were normal in position and appearance.  Post-Procedure: - Patient tolerated the procedure well  Assessment/ Plan: Bladder mucosa was normal in appearance without erythema and no endoscopic evidence of infection.  Abnormal urinalysis today most likely secondary to vaginal contamination.  A catheterized urine was obtained and I had requested urinalysis and culture however a repeat urinalysis was not run No abnormalities of the urethra noted Her chronic dysuria may be related to vaginitis/pessary She has a follow-up appointment with gynecology in the near future   Riki Altes, MD

## 2023-03-02 NOTE — Patient Instructions (Incomplete)
Use Premarin cream vaginally twice a week as previously prescribed.  Use Premarin cream externally to the urethra daily for 30 days, then decrease to twice a week.  Use dime-sized amount Can use a daily bladder probiotic to help with bladder health. Recommended brands include AZO, Culturelle, Uqora, and Cystex.

## 2023-03-02 NOTE — Progress Notes (Unsigned)
    GYNECOLOGY PROGRESS NOTE  Subjective:    [name], female    DOB: Apr 07, 1933, 89 y.o.   MRN: [id number]  HPI  Patient is a 89 y.o. G44P1001 female who presents for pessary maintenance. Her daughter has accompanied her today.  She reports that patient was evaluated by Urologist due to recurrent UTI's. Notes bladder scan done as well as a cystoscopy with no major findings. Thought that she may have a vaginal infection due to pessary use that was leading to the UTIs. She is currently on antibiotic suppressive therapy. Still notes occasional spotting with pessary use.   The following portions of the patient's history were reviewed and updated as appropriate: allergies, current medications, past family history, past medical history, past social history, past surgical history, and problem list.  Review of Systems Pertinent items noted in HPI and remainder of comprehensive ROS otherwise negative.   Objective:   Blood pressure 129/74, pulse 93, resp. rate 16, height 5' (1.524 m), weight 83 lb 6.4 oz (37.8 kg).  Body mass index is 16.29 kg/m. General appearance: alert and no distress Abdomen: soft, non-tender; bowel sounds normal; no masses,  no organomegaly Pelvic: The patient's  size 2 1/4 ring with support pessary was removed without complications. External exam appears normal.  Speculum examination revealed moderate atrophic vaginal mucosa, no obvious lacerations or abrasions noted in the vaginal vault. Small area of irritation at anterior vagina. Small amount of brown discharge.    Assessment:   1. Pessary maintenance   2. 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 Acute cystitis without hematuria   4. Vaginal atrophy      Plan:   - Continue use of Premarin cream for pessary maintenance. Using 1 gram twice weekly. Also advised to use dime sized amount to urethra daily for the next 30 days, then decrease to twice weekly.  - Reviewed previous  Urology notes and imaging. Discussed use of urinary probiotics in addition to maintenance antibiotics prescribed (Trimex).  - Vaginal culture performed today to rule out vaginitis.  - RTC in 3 months for pessary check.    A total of 35 minutes were spent during this encounter, including review of previous progress notes, recent imaging and labs, face-to-face with time with patient involving counseling and coordination of care, as well as documentation for current visit.     Hildred Laser, MD [facility]

## 2023-03-03 ENCOUNTER — Ambulatory Visit (INDEPENDENT_AMBULATORY_CARE_PROVIDER_SITE_OTHER): Payer: Medicare Other | Admitting: Obstetrics and Gynecology

## 2023-03-03 ENCOUNTER — Encounter: Payer: Self-pay | Admitting: Obstetrics and Gynecology

## 2023-03-03 ENCOUNTER — Other Ambulatory Visit (HOSPITAL_COMMUNITY)
Admission: RE | Admit: 2023-03-03 | Discharge: 2023-03-03 | Disposition: A | Discharge status: Home / Self Care | Payer: Medicare Other | Source: Ambulatory Visit | Attending: Obstetrics and Gynecology | Admitting: Obstetrics and Gynecology

## 2023-03-03 VITALS — BP 129/74 | HR 93 | Resp 16 | Ht 60.0 in | Wt 83.4 lb

## 2023-03-03 DIAGNOSIS — N813 Complete uterovaginal prolapse: Secondary | ICD-10-CM

## 2023-03-03 DIAGNOSIS — Z4689 Encounter for fitting and adjustment of other specified devices: Secondary | ICD-10-CM | POA: Insufficient documentation

## 2023-03-03 DIAGNOSIS — N390 Urinary tract infection, site not specified: Secondary | ICD-10-CM | POA: Diagnosis not present

## 2023-03-03 DIAGNOSIS — N952 Postmenopausal atrophic vaginitis: Secondary | ICD-10-CM | POA: Diagnosis not present

## 2023-03-05 ENCOUNTER — Telehealth: Payer: Self-pay

## 2023-03-05 ENCOUNTER — Other Ambulatory Visit: Payer: Self-pay

## 2023-03-05 NOTE — Telephone Encounter (Signed)
Angela Mason called and said she was in yesterday and told Dr. Valentino Saxon [facility]. Now she wants [facility]. I don't see in her chart any Premarin was sent in. Patient seemed to be very confused.

## 2023-03-05 NOTE — Telephone Encounter (Addendum)
Angela Mason's daughter called [facility] was confused.

## 2023-03-05 NOTE — Telephone Encounter (Signed)
You can contact patient's daughter to verify the pharmacy. If did not recommend that she switch over to Trimo-San gel due to her atrophy and history of recurrent UTI's as the premarin is better for her, but if she insists, can change over to the Trimo-San.

## 2023-03-05 NOTE — Telephone Encounter (Signed)
She is using Estrace cream (not Premarin, but is same type of cream just a different brand).  Can be refilled as this is in her chart.

## 2023-03-06 NOTE — Telephone Encounter (Signed)
Angela Mason called again this morning confused with her medications. I told her we got everything straighten out yesterday. I told her to use the cream she just recently purchased. She said okay and understood. Patient is confused I was advised yesterday she's going to assisted living in 2 weeks.

## 2023-03-13 ENCOUNTER — Other Ambulatory Visit: Payer: BLUE CROSS/BLUE SHIELD | Admitting: Urology

## 2023-03-16 ENCOUNTER — Encounter: Payer: Self-pay | Admitting: Student

## 2023-03-16 NOTE — Progress Notes (Signed)
A user error has taken place: encounter opened in error, closed for administrative reasons.

## 2023-04-02 ENCOUNTER — Telehealth: Payer: Self-pay

## 2023-04-02 NOTE — Telephone Encounter (Signed)
[employer] VOICEMAIL: Patient calling to report new address: [address] [place], [place] [address]

## 2023-04-06 ENCOUNTER — Non-Acute Institutional Stay: Payer: Self-pay | Admitting: Student

## 2023-04-06 ENCOUNTER — Encounter: Payer: Self-pay | Admitting: Student

## 2023-04-06 DIAGNOSIS — E039 Hypothyroidism, unspecified: Secondary | ICD-10-CM

## 2023-04-06 DIAGNOSIS — E43 Unspecified severe protein-calorie malnutrition: Secondary | ICD-10-CM

## 2023-04-06 DIAGNOSIS — N813 Complete uterovaginal prolapse: Secondary | ICD-10-CM

## 2023-04-06 DIAGNOSIS — R600 Localized edema: Secondary | ICD-10-CM

## 2023-04-06 DIAGNOSIS — F331 Major depressive disorder, recurrent, moderate: Secondary | ICD-10-CM | POA: Diagnosis not present

## 2023-04-06 DIAGNOSIS — R2689 Other abnormalities of gait and mobility: Secondary | ICD-10-CM

## 2023-04-06 DIAGNOSIS — I10 Essential (primary) hypertension: Secondary | ICD-10-CM

## 2023-04-06 DIAGNOSIS — Z741 Need for assistance with personal care: Secondary | ICD-10-CM | POA: Diagnosis not present

## 2023-04-06 DIAGNOSIS — R4189 Other symptoms and signs involving cognitive functions and awareness: Secondary | ICD-10-CM

## 2023-04-06 DIAGNOSIS — K5901 Slow transit constipation: Secondary | ICD-10-CM

## 2023-04-06 DIAGNOSIS — J449 Chronic obstructive pulmonary disease, unspecified: Secondary | ICD-10-CM

## 2023-04-06 NOTE — Progress Notes (Signed)
Provider:  [name], MD Location:  [facility] Room Number: Helen Hashimoto [id number] Place of Service:  ALF [phone])  PCP: [name], MD Patient Care Team: [name], MD as PCP - General (Family Medicine)  Extended Emergency Contact Information Primary Emergency Contact: [name],[name] Address: Note:  my last name is OBEY, not [name]: [phone] Relation: Daughter Secondary Emergency Contact: Cumbee,Carolyn Address: [address] (neighbor - 100 yards away)          [place], [place] [address] [place] Phone: [phone] Relation: Friend  Code Status: Full Code Goals of Care: Advanced Directive information    04/06/2023    1:24 PM  Advanced Directives  Does Patient Have a Medical Advance Directive? No  Would patient like information on creating a medical advance directive? No - Patient declined      Chief Complaint  Patient presents with   Acute Visit    New admission    HPI: Patient is a 89 y.o. female seen today for admission to [facility] from [place].  Daughter in [place], Husband passed away 2022/05/01 year.   Sometimes the food isn't quite what she likes. Too much pork or things she doesn' ttypically eat.   She had a visit with dermatology for an area on her forehead.   Urology with [facility] things are goin g well.   Dr. Valentino Saxon for pessary change and it was her usual thing. Shouldn' thave to do it more than 2 x per week which she has been doing for the urethra as well.   She has some art work in the home. She took a painting class with friends  No issues with constipation at this time.   She has met some nice folks here. She had a receital yesterday for a dance group and that was nice to go to.   She has not spent as long on the toilet like she idd previously.   Past Medical History:  Diagnosis Date   Cancer (HCC)    SKIN   COPD, mild (HCC)    Dyspnea    Edema    HLD  (hyperlipidemia)    HTN (hypertension)    Hypothyroidism    Macular degeneration, bilateral    Varicosities    Past Surgical History:  Procedure Laterality Date   CATARACT EXTRACTION W/PHACO Right 02/03/2017   Procedure: CATARACT EXTRACTION PHACO AND INTRAOCULAR LENS PLACEMENT (IOC);  Surgeon: Galen Manila, MD;  Location: [facility] ORS;  Service: Ophthalmology;  Laterality: Right;  [place] 00:57.2 AP% 18.5 CDE 10.58 Fluid pack lot # [id number]   CATARACT EXTRACTION W/PHACO Left 02/24/2017   Procedure: CATARACT EXTRACTION PHACO AND INTRAOCULAR LENS PLACEMENT (IOC);  Surgeon: Galen Manila, MD;  Location: [facility] ORS;  Service: Ophthalmology;  Laterality: Left;  [place] 01:12 AP% 25.0 CDE 18.03 Fluid pack lot # [id number]   MOHS SURGERY  2015   nose   SALIVARY GLAND SURGERY Left May 02, 2011   TONSILLECTOMY  1940    reports that she quit smoking about 17 years ago. Her smoking use included cigarettes. She has never used smokeless tobacco. She reports that she does not currently use alcohol. She reports that she does not use drugs. Social History   Socioeconomic History   Marital status: Widowed    Spouse name: Not on file   Number of children: Not on file   Years of education: Not on file   Highest education level: Not on file  Occupational History  Not on file  Tobacco Use   Smoking status: Former    Current packs/day: 0.00    Types: Cigarettes    Quit date: 09/05/2005    Years since quitting: 17.5   Smokeless tobacco: Never  Vaping Use   Vaping status: Never Used  Substance and Sexual Activity   Alcohol use: Not Currently   Drug use: No   Sexual activity: Not Currently    Birth control/protection: Post-menopausal  Other Topics Concern   Not on file  Social History Narrative   Not on file   Social Determinants of Health   Financial Resource Strain: Not on file  Food Insecurity: Not on file  Transportation Needs: Not on file  Physical Activity: Not on file  Stress: Not on file   Social Connections: Not on file  Intimate Partner Violence: Not on file    Functional Status Survey:    Family History  Problem Relation Age of Onset   Diverticulitis Mother    Macular degeneration Mother    COPD Father    Heart failure Father    Epilepsy Sister    Parkinson's disease Brother    Cancer Neg Hx    Diabetes Neg Hx    Breast cancer Neg Hx     Health Maintenance  Topic Date Due   Medicare Annual Wellness (AWV)  Never done   DTaP/Tdap/Td (1 - Tdap) Never done   Zoster Vaccines- Shingrix (1 of 2) Never done   DEXA SCAN  Never done   COVID-19 Vaccine (1) 06/12/2020   INFLUENZA VACCINE  02/26/2023   Pneumonia Vaccine 45+ Years old  Completed   HPV VACCINES  Aged Out    Allergies  Allergen Reactions   Gentamicin    Levofloxacin Diarrhea    Outpatient Encounter Medications as of 04/06/2023  Medication Sig   acetaminophen (TYLENOL) 325 MG tablet Take 650 mg by mouth every 4 (four) hours as needed.   aluminum-magnesium hydroxide 200-200 MG/5ML suspension Take 5 mLs by mouth every 4 (four) hours as needed for indigestion.   bismuth subsalicylate (PEPTO BISMOL) 262 MG/15ML suspension Take 30 mLs by mouth as needed.   carbamide peroxide (DEBROX) 6.5 % OTIC solution Place 5 drops into both ears as needed.   cetirizine (ZYRTEC) 5 MG tablet Take 5 mg by mouth daily as needed for allergies.   dextromethorphan-guaiFENesin (ROBITUSSIN-DM) 10-100 MG/5ML liquid Take 5 mLs by mouth every 4 (four) hours as needed for cough.   dextrose (GLUTOSE) 40 % GEL Take 1 Tube by mouth as needed for low blood sugar.   estradiol (ESTRACE) 0.1 MG/GM vaginal cream Place 1 Applicatorful vaginally 2 (two) times a week.   losartan (COZAAR) 50 MG tablet Take 1 tablet (50 mg total) by mouth daily.   lovastatin (MEVACOR) 20 MG tablet Take 1 tablet (20 mg total) by mouth at bedtime.   magnesium hydroxide (MILK OF MAGNESIA) 400 MG/5ML suspension Take 5 mLs by mouth daily as needed for mild  constipation.   Multiple Vitamins-Minerals (PRESERVISION AREDS) TABS Take 1 capsule by mouth 2 (two) times daily.   nystatin (MYCOSTATIN/NYSTOP) powder Apply 1 Application topically 2 (two) times daily.   Omega-3 Fatty Acids (FISH OIL CONCENTRATE PO) Take 1 tablet by mouth daily.    ondansetron (ZOFRAN) 4 MG tablet Take 4 mg by mouth every 8 (eight) hours as needed for nausea or vomiting.   OXYGEN 2 lpm for dyspnea or SOB   Polyvinyl Alcohol-Povidone PF (REFRESH) 1.4-0.6 % SOLN Place 1 drop into  both eyes in the morning and at bedtime.   ARIPiprazole (ABILIFY) 2 MG tablet Take 1 tablet (2 mg total) by mouth daily with breakfast.   No facility-administered encounter medications on file as of 04/06/2023.    Review of Systems  Vitals:   04/06/23 1315  BP: (!) 146/79  Pulse: 89  Resp: 16  Temp: (!) 97.5 F (36.4 C)  SpO2: 90%  Weight: 86 lb 12.8 oz (39.4 kg)  Height: 5' (1.524 m)   Body mass index is 16.95 kg/m. Physical Exam Constitutional:      Appearance: Normal appearance.  Cardiovascular:     Rate and Rhythm: Normal rate and regular rhythm.     Pulses: Normal pulses.     Heart sounds: Normal heart sounds.  Pulmonary:     Effort: Pulmonary effort is normal.  Abdominal:     General: Abdomen is flat.     Palpations: Abdomen is soft.  Musculoskeletal:     Comments: Trace edema, compression stockings in place  Neurological:     Mental Status: She is alert.     Labs reviewed: Basic Metabolic Panel: Recent Labs    05/01/22 0000 06/26/22 1828 07/31/22 0000 11/03/22 0000  NA 128* 130* 136* 134*  K 4.1 4.8 4.6 4.1  CL 89* 89* 96* 94*  CO2 33* 32 34* 35*  GLUCOSE  --  156*  --   --   BUN 10 26* 20 21  CREATININE 0.5 0.63 0.6 0.6  CALCIUM 9.3 9.5  --  9.2   Liver Function Tests: Recent Labs    05/01/22 0000 11/03/22 0000  AST 17 19  ALT 11 18  ALKPHOS 84 74  ALBUMIN 4.1 4.2   No results for input(s): "LIPASE", "AMYLASE" in the last 8760 hours. No results  for input(s): "AMMONIA" in the last 8760 hours. CBC: Recent Labs    06/26/22 1828 11/03/22 0000  WBC 9.2 6.5  NEUTROABS 8.0* 5,116.00  HGB 12.1 12.1  HCT 37.6 36  MCV 98.4  --   PLT 268 310   Cardiac Enzymes: No results for input(s): "CKTOTAL", "CKMB", "CKMBINDEX", "TROPONINI" in the last 8760 hours. BNP: Invalid input(s): "POCBNP" Lab Results  Component Value Date   HGBA1C 5.6 11/03/2022   Lab Results  Component Value Date   TSH 0.657 10/04/2020   Lab Results  Component Value Date   VITAMINB12 1,225 (H) 10/10/2020   No results found for: "FOLATE" No results found for: "IRON", "TIBC", "FERRITIN"  Imaging and Procedures obtained prior to SNF admission: No results found.  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 Transitioning well. Constipation improved. Mood improving. Edema well-managed with compression. Weight stable, continue to monitor-- weight gain is desirable. BP slightly above goal, continue current regimen and recheck given new location. Follows urology and gynecology for prolapse and chronic dysuria.   Family/ staff Communication: nursing  Labs/tests ordered: Annual TSH, q47mo TSH, CMP, CBC

## 2023-04-17 ENCOUNTER — Ambulatory Visit: Payer: Medicare Other | Admitting: Student

## 2023-05-26 ENCOUNTER — Encounter: Payer: Self-pay | Admitting: Nurse Practitioner

## 2023-05-26 NOTE — Progress Notes (Unsigned)
This encounter was created in error - please disregard.

## 2023-06-09 ENCOUNTER — Ambulatory Visit (INDEPENDENT_AMBULATORY_CARE_PROVIDER_SITE_OTHER): Payer: Medicare Other | Admitting: Obstetrics and Gynecology

## 2023-06-09 ENCOUNTER — Encounter: Payer: Self-pay | Admitting: Obstetrics and Gynecology

## 2023-06-09 VITALS — BP 160/68 | HR 94 | Ht 60.0 in | Wt 92.8 lb

## 2023-06-09 DIAGNOSIS — N813 Complete uterovaginal prolapse: Secondary | ICD-10-CM

## 2023-06-09 DIAGNOSIS — Z4689 Encounter for fitting and adjustment of other specified devices: Secondary | ICD-10-CM | POA: Diagnosis not present

## 2023-06-09 DIAGNOSIS — Z87440 Personal history of urinary (tract) infections: Secondary | ICD-10-CM

## 2023-06-09 DIAGNOSIS — K640 First degree hemorrhoids: Secondary | ICD-10-CM | POA: Diagnosis not present

## 2023-06-09 DIAGNOSIS — N390 Urinary tract infection, site not specified: Secondary | ICD-10-CM

## 2023-06-09 NOTE — Progress Notes (Signed)
    GYNECOLOGY PROGRESS NOTE  Subjective:    [name], female    DOB: 07/08/1933, 89 y.o.   MRN: [id number]  HPI  Patient is a 89 y.o. G65P1001 female who presents for pessary maintenance for cystocele with rectocele and complete uterine prolapse. Also with a history of recurrent UTI's. Her daughter has accompanied her for today's visit.  She is currently on antibiotic suppressive therapy (Trimex), after negative evaluation performed by Urology several months ago. Still notes occasional spotting with pessary use. Is using Premarin cream twice weekly as prescribed. Also thinks that she has a hemorrhoid that may be causing some bleeding. Has noted symptoms for ~ 1 week.  Has not tried anything to treat the hemorrhoid.   The following portions of the patient's history were reviewed and updated as appropriate: allergies, current medications, past family history, past medical history, past social history, past surgical history, and problem list.  Review of Systems Pertinent items noted in HPI and remainder of comprehensive ROS otherwise negative.   Objective:   Blood pressure (!) 160/68, pulse 94, height 5' (1.524 m), weight 92 lb 12.8 oz (42.1 kg).  Body mass index is 18.12 kg/m.  Repeat BP  General appearance: alert and no distress Abdomen: soft, non-tender; bowel sounds normal; no masses,  no organomegaly Pelvic: The patient's  size 2 1/4 ring with support pessary was removed without complications. External exam appears normal.  Speculum examination revealed mild to moderate atrophic vaginal mucosa (however improved since last visit). No obvious lacerations or abrasions noted in the vaginal vault. Small amount of dark red blood in vaginal vault.  Rectal: small single external hemorrhoid with mild rectal irritation present. Minimal tenderness present.    Assessment:   1. Pessary maintenance   2. 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 Grade I  hemorrhoids   4. Recurrent UTI      Plan:   - Continue use of Premarin cream for pessary maintenance. Using 1 gram twice weekly. Has not been utilizing small dime-sized amount at urethra as she forgets.  Also concerns that she is not inserting the applicator far enough into the vagina as she states that most of it comes back out. Educated on use of applicator with her daughter.  - Continue antibiotic suppression therapy for h/o recurrent UTI's.  -  Discussed use of OTC Preparation- H for hemorrhoids.  - If patient continues to bleed by next visit, may need to discuss taking short hiatus (2-3 weeks) to assess if bleeding stops. If it does not resolve, may need to perform further assessment with pelvic ultrasound to rule out other causes of bleeding.  - RTC in 3 months for pessary check.    A total of 28 minutes were spent during this encounter, including review of previous progress notes, recent imaging and labs, face-to-face with time with patient involving counseling and coordination of care, as well as documentation for current visit.     Hildred Laser, MD [facility]

## 2023-06-09 NOTE — Patient Instructions (Signed)
How to Use a Vaginal Pessary  A vaginal pessary is a removable device that is placed into your vagina to support pelvic organs that droop. These organs include your uterus, bladder, and rectum. When your pelvic organs drop down into your vagina, it causes a condition called pelvic organ prolapse (POP). A pessary may be an alternative to surgery for women with POP. It may help women who leak urine when they strain or exercise (stress incontinence). This is a symptom of POP. A vaginal pessary may also be a temporary treatment for stress incontinence during pregnancy. There are several types of pessaries. All types are usually made of silicone. You can insert and remove some on your own. Other types must be inserted and removed by your health care provider at office visits. The reason you are using a pessary and the severity of your condition will determine which one is best for you. It is also important to find the right size. A pessary that is too small may fall out. A pessary that is too large may cause pain or discomfort. Your health care provider will do a physical exam to find the correct size and fit for your pessary. It may take several appointments to find the best fit for you. If you can be fit with the type of pessary that you can insert, remove, and clean yourself, your health care provider will teach you how to use your pessary at home. You may have checkups every few months. If you have the type of pessary that needs to be inserted and removed by your health care provider, you will have appointments every few months to have the pessary removed, cleaned, and replaced. What are the risks? When properly fitted and cared for, risks of using a vaginal pessary can be small. However, there can be problems that may include: Vaginal discharge. Vaginal bleeding. A bad smell coming from your vagina. Scraping of the skin inside your vagina. How to use your pessary Follow your health care provider's  instructions for using a pessary. These instructions may vary, depending on the type of pessary you have. To insert a pessary: Wash your hands with soap and water for at least 20 seconds. Squeeze or fold the pessary in half and lubricate the tip with a water-based lubricant. Insert the pessary into your vagina. It will unfold and provide support. To remove the pessary, gently tug it out of your vagina. You can remove the pessary every night or after several days. You can also remove it to have sex. How to care for your pessary If you have a pessary that you can remove: Clean your pessary with soap and water. Rinse well. Dry it completely before inserting it back into your vagina. Follow these instructions at home: Take over-the-counter and prescription medicines only as told by your health care provider. Your health care provider may prescribe an estrogen cream to moisten your vagina. Keep all follow-up visits. This is important. Contact a health care provider if: You feel any pain or discomfort when your pessary is in place. You continue to have stress incontinence. You have trouble keeping your pessary from falling out. You have an unusual vaginal discharge that is blood-tinged or smells bad. Summary A vaginal pessary is a removable device that is placed into your vagina to support pelvic organs that droop. This condition is called pelvic organ prolapse (POP). There are several types of pessaries. Some you can insert and remove on your own. Others must be inserted and  removed by your health care provider. The best type for you depends on the reason you are using a pessary and the severity of your condition. It is also important to find the right size. If you can use the type that you insert and remove on your own, your health care provider will teach you how to use it and schedule checkups every few months. If you have the type that needs to be inserted and removed by your health care  provider, you will have regular appointments to have your pessary removed, cleaned, and replaced. This information is not intended to replace advice given to you by your health care provider. Make sure you discuss any questions you have with your health care provider. Document Revised: 01/12/2020 Document Reviewed: 01/12/2020 Elsevier Patient Education  2024 [employer].

## 2023-06-24 ENCOUNTER — Telehealth: Payer: Self-pay

## 2023-06-24 DIAGNOSIS — N952 Postmenopausal atrophic vaginitis: Secondary | ICD-10-CM

## 2023-06-24 NOTE — Telephone Encounter (Signed)
Yes.  I've prescribed this for her before, not sure why it says historical. She caqn do Estrace as it will likely be cheaper for her. Dosing is the same.

## 2023-06-24 NOTE — Telephone Encounter (Signed)
Patient requesting rx for estradiol (ESTRACE) 0.1 MG/GM vaginal cream or premarin cream (Dr. Valentino Saxon gave Sample) to be sent to [facility].

## 2023-06-30 ENCOUNTER — Encounter: Payer: Self-pay | Admitting: Nurse Practitioner

## 2023-06-30 ENCOUNTER — Non-Acute Institutional Stay: Payer: Self-pay | Admitting: Nurse Practitioner

## 2023-06-30 DIAGNOSIS — J449 Chronic obstructive pulmonary disease, unspecified: Secondary | ICD-10-CM

## 2023-06-30 DIAGNOSIS — F331 Major depressive disorder, recurrent, moderate: Secondary | ICD-10-CM | POA: Diagnosis not present

## 2023-06-30 DIAGNOSIS — N813 Complete uterovaginal prolapse: Secondary | ICD-10-CM | POA: Diagnosis not present

## 2023-06-30 DIAGNOSIS — I10 Essential (primary) hypertension: Secondary | ICD-10-CM | POA: Diagnosis not present

## 2023-06-30 DIAGNOSIS — N952 Postmenopausal atrophic vaginitis: Secondary | ICD-10-CM

## 2023-06-30 DIAGNOSIS — E785 Hyperlipidemia, unspecified: Secondary | ICD-10-CM

## 2023-06-30 NOTE — Progress Notes (Signed)
Location:  [facility] Room Number: 207 S Place of Service:  ALF (13)  [name], MD  Patient Care Team: [name], MD as PCP - General [facility])  Extended Emergency Contact Information Primary Emergency Contact: [name],[name] Address: Note:  my last name is Angela Mason, not Angela Mason: [phone] Relation: Daughter Secondary Emergency Contact: Cumbee,Carolyn Address: [address] (neighbor - 100 yards away)          [place], [place] [address] [place] Phone: [phone] Relation: Friend  Goals of care: Advanced Directive information    06/30/2023    2:07 PM  Advanced Directives  Does Patient Have a Medical Advance Directive? Yes  Type of Advance Directive Healthcare Power of Attorney  Does patient want to make changes to medical advance directive? No - Patient declined  Copy of Healthcare Power of Attorney in Chart? Yes - validated most recent copy scanned in chart (See row information)     Chief Complaint  Patient presents with  . Medical Management of Chronic Issues    Routine visit. Discuss need for AWV, td/tdap, and DEXA    HPI:  Pt is a 89 y.o. female seen today for medical management of chronic disease. Pt reports she is doing well. Went to the eye doctor yesterday and the numbing drops did not take full effect so she felt the shot- did not like this.   Reports once she relaxes her BP goes down but if the staff comes in and request that she get bp it goes up. Reports everything she eats is full of salt.  When she cooked before she did low sodium.   Reports mood has been doing okay, she likes her routine. Reports she is not one to go with the flow.   Gets up frequently at night to go to the restroom. Follows with urogyn. No changes in urinary symptoms.   Eating more ice cream to get her full- does not love the food here.   Denies pain.   Past Medical History:  Diagnosis Date   . Cancer (HCC)    SKIN  . COPD, mild (HCC)   . Dyspnea   . Edema   . HLD (hyperlipidemia)   . HTN (hypertension)   . Hypothyroidism   . Macular degeneration, bilateral   . Varicosities    Past Surgical History:  Procedure Laterality Date  . CATARACT EXTRACTION W/PHACO Right 02/03/2017   Procedure: CATARACT EXTRACTION PHACO AND INTRAOCULAR LENS PLACEMENT (IOC);  Surgeon: Galen Manila, MD;  Location: [facility] ORS;  Service: Ophthalmology;  Laterality: Right;  [place] 00:57.2 AP% 18.5 CDE 10.58 Fluid pack lot # [id number]  . CATARACT EXTRACTION W/PHACO Left 02/24/2017   Procedure: CATARACT EXTRACTION PHACO AND INTRAOCULAR LENS PLACEMENT (IOC);  Surgeon: Galen Manila, MD;  Location: [facility] ORS;  Service: Ophthalmology;  Laterality: Left;  [place] 01:12 AP% 25.0 CDE 18.03 Fluid pack lot # [id number]  . MOHS SURGERY  2015   nose  . SALIVARY GLAND SURGERY Left 2012  . TONSILLECTOMY  1940    Allergies  Allergen Reactions  . Gentamicin   . Levofloxacin Diarrhea    Outpatient Encounter Medications as of 06/30/2023  Medication Sig  . acetaminophen (TYLENOL) 325 MG tablet Take 650 mg by mouth every 4 (four) hours as needed.  Marland Kitchen aluminum-magnesium hydroxide 200-200 MG/5ML suspension Take 5 mLs by mouth every 4 (four) hours as needed for indigestion.  . ARIPiprazole (ABILIFY) 2 MG tablet Take 1 tablet (  2 mg total) by mouth daily with breakfast.  . bismuth subsalicylate (PEPTO BISMOL) 262 MG/15ML suspension Take 30 mLs by mouth as needed.  . carbamide peroxide (DEBROX) 6.5 % OTIC solution Place 5 drops into both ears as needed.  . cetirizine (ZYRTEC) 5 MG tablet Take 5 mg by mouth daily as needed for allergies.  . chlorhexidine (PERIDEX) 0.12 % solution Give 0.5 applicatorful by mouth two times a day for crown lengthening procedure unsupervised self-administration after breakfast and before bed  . dextromethorphan-guaiFENesin (ROBITUSSIN-DM) 10-100 MG/5ML liquid Take 5 mLs by mouth every 4 (four)  hours as needed for cough.  . dextrose (GLUTOSE) 40 % GEL Take 1 Tube by mouth as needed for low blood sugar.  . estradiol (ESTRACE) 0.1 MG/GM vaginal cream Place 1 Applicatorful vaginally 2 (two) times a week.  . Lactobacillus Rhamnosus, GG, (CULTURELLE) CAPS Take 1 capsule by mouth daily.  Marland Kitchen losartan (COZAAR) 50 MG tablet Take 1 tablet (50 mg total) by mouth daily.  Marland Kitchen lovastatin (MEVACOR) 20 MG tablet Take 1 tablet (20 mg total) by mouth at bedtime.  . magnesium hydroxide (MILK OF MAGNESIA) 400 MG/5ML suspension 2 TBS by mouth as needed for constipation  . Multiple Vitamins-Minerals (PRESERVISION AREDS) TABS Take 1 capsule by mouth 2 (two) times daily.  Marland Kitchen nystatin (MYCOSTATIN/NYSTOP) powder Apply 1 Application topically 2 (two) times daily.  . Omega-3 Fatty Acids (FISH OIL CONCENTRATE PO) Take 1 tablet by mouth daily.   . ondansetron (ZOFRAN) 4 MG tablet Take 4 mg by mouth every 8 (eight) hours as needed for nausea or vomiting.  . OXYGEN 2 lpm for dyspnea or SOB  . Polyvinyl Alcohol-Povidone PF (REFRESH) 1.4-0.6 % SOLN Place 1 drop into both eyes in the morning and at bedtime.  . magnesium hydroxide (MILK OF MAGNESIA) 400 MG/5ML suspension Take 5 mLs by mouth daily as needed for mild constipation. (Patient not taking: Reported on 06/30/2023)   No facility-administered encounter medications on file as of 06/30/2023.    Review of Systems  Constitutional:  Negative for activity change, appetite change, fatigue and unexpected weight change.  HENT:  Negative for congestion and hearing loss.   Eyes: Negative.   Respiratory:  Negative for cough and shortness of breath.   Cardiovascular:  Negative for chest pain, palpitations and leg swelling.  Gastrointestinal:  Negative for abdominal pain, constipation and diarrhea.  Genitourinary:  Negative for difficulty urinating and dysuria.  Musculoskeletal:  Negative for arthralgias and myalgias.  Skin:  Negative for color change and wound.  Neurological:   Negative for dizziness and weakness.  Psychiatric/Behavioral:  Negative for agitation, behavioral problems and confusion.      Immunization History  Administered Date(s) Administered  . Influenza, High Dose Seasonal PF 05/21/2017, 05/07/2022  . Influenza-Unspecified 05/09/2021, 05/15/2023  . PFIZER SARS-COV-2 Pediatric Vaccination 5-15yrs 08/09/2019, 09/06/2019, 06/12/2020  . PNEUMOCOCCAL CONJUGATE-20 02/11/2022  . PPD Test 10/16/2020, 10/26/2020, 02/01/2022, 02/10/2022  . Pneumococcal Conjugate-13 08/08/2018  . Zoster Recombinant(Shingrix) 02/05/2023, 06/10/2023   Pertinent  Health Maintenance Due  Topic Date Due  . DEXA SCAN  Never done  . INFLUENZA VACCINE  Completed      09/19/2022    2:02 PM 11/14/2022    1:17 PM 01/14/2023    1:48 PM 01/30/2023    3:20 PM 03/03/2023    3:50 PM  Fall Risk  Falls in the past year? 0 0 0 0 0  Was there an injury with Fall? 0 0 0 0 0  Fall Risk Category Calculator  0 0 0 0 0  Patient at Risk for Falls Due to No Fall Risks   No Fall Risks Impaired balance/gait  Fall risk Follow up Falls evaluation completed   Falls evaluation completed Falls evaluation completed   Functional Status Survey:    Vitals:   06/30/23 1405 06/30/23 1407  BP: (!) 153/72 (!) 186/78  Pulse: 80   Weight: 90 lb (40.8 kg)   Height: 5' (1.524 m)    Body mass index is 17.58 kg/m. Physical Exam Constitutional:      General: She is not in acute distress.    Appearance: She is well-developed. She is not diaphoretic.  HENT:     Head: Normocephalic and atraumatic.     Mouth/Throat:     Pharynx: No oropharyngeal exudate.  Eyes:     Conjunctiva/sclera: Conjunctivae normal.     Pupils: Pupils are equal, round, and reactive to light.  Cardiovascular:     Rate and Rhythm: Normal rate and regular rhythm.     Heart sounds: Normal heart sounds.  Pulmonary:     Effort: Pulmonary effort is normal.     Breath sounds: Normal breath sounds.  Abdominal:     General: Bowel  sounds are normal.     Palpations: Abdomen is soft.  Musculoskeletal:     Cervical back: Normal range of motion and neck supple.     Right lower leg: No edema.     Left lower leg: No edema.  Skin:    General: Skin is warm and dry.  Neurological:     Mental Status: She is alert and oriented to person, place, and time.     Motor: No weakness.     Gait: Gait normal.  Psychiatric:        Mood and Affect: Mood normal.    Labs reviewed: Recent Labs    07/31/22 0000 11/03/22 0000  NA 136* 134*  K 4.6 4.1  CL 96* 94*  CO2 34* 35*  BUN 20 21  CREATININE 0.6 0.6  CALCIUM  --  9.2   Recent Labs    11/03/22 0000  AST 19  ALT 18  ALKPHOS 74  ALBUMIN 4.2   Recent Labs    11/03/22 0000  WBC 6.5  NEUTROABS 5,116.00  HGB 12.1  HCT 36  PLT 310   Lab Results  Component Value Date   TSH 0.657 10/04/2020   Lab Results  Component Value Date   HGBA1C 5.6 11/03/2022   Lab Results  Component Value Date   CHOL 181 11/03/2022   HDL 74 (A) 11/03/2022   LDLCALC 88 11/03/2022   TRIG 101 11/03/2022    Significant Diagnostic Results in last 30 days:  No results found.  Assessment/Plan 1. Primary hypertension -elevated on last BP check however pt reports improves once she relaxes. Will have staff recheck and notify if remains elevated -follow up cbc, bmp  2. Chronic obstructive pulmonary disease, unspecified COPD type (HCC) Symptoms stable, not currently on medication at this time  4. MDD (major depressive disorder), recurrent episode, moderate (HCC) -stable, continues on Abilify   5. 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 Vaginal atrophy Stable on estrace vaginally twice weekly  7. Hyperlipidemia, unspecified hyperlipidemia type LDL at goal on lovastatin on last labs -lipids on next lab check.    Janene Harvey. Biagio Borg [facility] [phone]

## 2023-07-02 LAB — BASIC METABOLIC PANEL
BUN: 22 — AB (ref 4–21)
CO2: 34 — AB (ref 13–22)
Chloride: 98 — AB (ref 99–108)
Creatinine: 0.6 (ref 0.5–1.1)
Glucose: 87
Potassium: 4 meq/L (ref 3.5–5.1)
Sodium: 138 (ref 137–147)

## 2023-07-02 LAB — CBC: RBC: 3.66 — AB (ref 3.87–5.11)

## 2023-07-02 LAB — HEPATIC FUNCTION PANEL
ALT: 13 U/L (ref 7–35)
AST: 18 (ref 13–35)
Alkaline Phosphatase: 76 (ref 25–125)
Bilirubin, Total: 0.5

## 2023-07-02 LAB — CBC AND DIFFERENTIAL
HCT: 36 (ref 36–46)
Hemoglobin: 11.7 — AB (ref 12.0–16.0)
Neutrophils Absolute: 6165
Platelets: 252 10*3/uL (ref 150–400)
WBC: 7.5

## 2023-07-02 LAB — LIPID PANEL
Cholesterol: 195 (ref 0–200)
HDL: 68 (ref 35–70)
LDL Cholesterol: 108

## 2023-07-02 LAB — COMPREHENSIVE METABOLIC PANEL
Albumin: 4 (ref 3.5–5.0)
Globulin: 2.4
eGFR: 86

## 2023-07-02 MED ORDER — ESTRADIOL 0.1 MG/GM VA CREA: 1.0000 | TOPICAL_CREAM | VAGINAL | 1 refills | Status: DC

## 2023-07-02 NOTE — Telephone Encounter (Signed)
Rx sent. No Answer, Voicemail not active, unable to leave message to notify patient.

## 2023-07-06 NOTE — Telephone Encounter (Signed)
Patient aware.

## 2023-07-09 ENCOUNTER — Non-Acute Institutional Stay: Payer: Medicare Other | Admitting: Nurse Practitioner

## 2023-07-09 ENCOUNTER — Encounter: Payer: Self-pay | Admitting: Nurse Practitioner

## 2023-07-09 DIAGNOSIS — Z0000 Encounter for general adult medical examination without abnormal findings: Secondary | ICD-10-CM

## 2023-07-09 NOTE — Patient Instructions (Signed)
  Ms. Angela Mason , Thank you for taking time to come for your Medicare Wellness Visit. I appreciate your ongoing commitment to your health goals. Please review the following plan we discussed and let me know if I can assist you in the future.   Let us know if you want your bone density checked.    This is a list of the screening recommended for you and due dates:  Health Maintenance  Topic Date Due   DTaP/Tdap/Td vaccine (1 - Tdap) Never done   DEXA scan (bone density measurement)  Never done   COVID-19 Vaccine (1) 07/16/2023*   Medicare Annual Wellness Visit  07/08/2024   Pneumonia Vaccine  Completed   Flu Shot  Completed   Zoster (Shingles) Vaccine  Completed   HPV Vaccine  Aged Out  *Topic was postponed. The date shown is not the original due date.

## 2023-07-09 NOTE — Progress Notes (Signed)
Subjective:   Angela Mason is a 89 y.o. female who presents for Medicare Annual (Subsequent) preventive examination.  Visit Complete: In person [facility]  .  Cardiac Risk Factors include: advanced age (>34men, >38 women);hypertension;dyslipidemia     Objective:    There were no vitals filed for this visit. There is no height or weight on file to calculate BMI.     07/09/2023    9:13 AM 06/30/2023    2:07 PM 05/26/2023    2:31 PM 04/06/2023    1:24 PM 03/16/2023    3:59 PM 01/14/2023    1:48 PM 11/14/2022    1:17 PM  Advanced Directives  Does Patient Have a Medical Advance Directive? No Yes No No No No Yes  Type of Psychologist, forensic of [place];Living will  Does patient want to make changes to medical advance directive? No - Patient declined No - Patient declined    No - Patient declined No - Patient declined  Copy of Healthcare Power of Attorney in Chart?  Yes - validated most recent copy scanned in chart (See row information)     No - copy requested  Would patient like information on creating a medical advance directive?   No - Patient declined No - Patient declined No - Patient declined      Current Medications (verified) Outpatient Encounter Medications as of 07/09/2023  Medication Sig   acetaminophen (TYLENOL) 325 MG tablet Take 650 mg by mouth every 4 (four) hours as needed.   aluminum-magnesium hydroxide 200-200 MG/5ML suspension Take 5 mLs by mouth every 4 (four) hours as needed for indigestion.   ARIPiprazole (ABILIFY) 2 MG tablet Take 1 tablet (2 mg total) by mouth daily with breakfast.   bismuth subsalicylate (PEPTO BISMOL) 262 MG/15ML suspension Take 30 mLs by mouth as needed.   carbamide peroxide (DEBROX) 6.5 % OTIC solution Place 5 drops into both ears as needed.   cetirizine (ZYRTEC) 5 MG tablet Take 5 mg by mouth daily as needed for allergies.   chlorhexidine (PERIDEX) 0.12 % solution Give 0.5 applicatorful  by mouth two times a day for crown lengthening procedure unsupervised self-administration after breakfast and before bed   dextromethorphan-guaiFENesin (ROBITUSSIN-DM) 10-100 MG/5ML liquid Take 5 mLs by mouth every 4 (four) hours as needed for cough.   dextrose (GLUTOSE) 40 % GEL Take 1 Tube by mouth as needed for low blood sugar.   estradiol (ESTRACE) 0.1 MG/GM vaginal cream Place 1 Applicatorful vaginally 2 (two) times a week.   Lactobacillus Rhamnosus, GG, (CULTURELLE) CAPS Take 1 capsule by mouth daily.   losartan (COZAAR) 50 MG tablet Take 1 tablet (50 mg total) by mouth daily.   lovastatin (MEVACOR) 20 MG tablet Take 1 tablet (20 mg total) by mouth at bedtime.   magnesium hydroxide (MILK OF MAGNESIA) 400 MG/5ML suspension Take 5 mLs by mouth daily as needed for mild constipation.   Multiple Vitamins-Minerals (PRESERVISION AREDS) TABS Take 1 capsule by mouth 2 (two) times daily.   nystatin (MYCOSTATIN/NYSTOP) powder Apply 1 Application topically 2 (two) times daily.   Omega-3 Fatty Acids (FISH OIL CONCENTRATE PO) Take 1 tablet by mouth daily.    ondansetron (ZOFRAN) 4 MG tablet Take 4 mg by mouth every 8 (eight) hours as needed for nausea or vomiting.   OXYGEN 2 lpm for dyspnea or SOB   Polyvinyl Alcohol-Povidone PF (REFRESH) 1.4-0.6 % SOLN Place 1 drop into both eyes in the morning  and at bedtime.   [DISCONTINUED] magnesium hydroxide (MILK OF MAGNESIA) 400 MG/5ML suspension 2 TBS by mouth as needed for constipation   No facility-administered encounter medications on file as of 07/09/2023.    Allergies (verified) Gentamicin and Levofloxacin   History: Past Medical History:  Diagnosis Date   Cancer (HCC)    SKIN   COPD, mild (HCC)    Dyspnea    Edema    HLD (hyperlipidemia)    HTN (hypertension)    Hypothyroidism    Macular degeneration, bilateral    Varicosities    Past Surgical History:  Procedure Laterality Date   CATARACT EXTRACTION W/PHACO Right 02/03/2017    Procedure: CATARACT EXTRACTION PHACO AND INTRAOCULAR LENS PLACEMENT (IOC);  Surgeon: Galen Manila, MD;  Location: [facility] ORS;  Service: Ophthalmology;  Laterality: Right;  [place] 00:57.2 AP% 18.5 CDE 10.58 Fluid pack lot # [id number]   CATARACT EXTRACTION W/PHACO Left 02/24/2017   Procedure: CATARACT EXTRACTION PHACO AND INTRAOCULAR LENS PLACEMENT (IOC);  Surgeon: Galen Manila, MD;  Location: [facility] ORS;  Service: Ophthalmology;  Laterality: Left;  [place] 01:12 AP% 25.0 CDE 18.03 Fluid pack lot # [id number]   MOHS SURGERY  2015   nose   SALIVARY GLAND SURGERY Left 2012   TONSILLECTOMY  1940   Family History  Problem Relation Age of Onset   Diverticulitis Mother    Macular degeneration Mother    COPD Father    Heart failure Father    Epilepsy Sister    Parkinson's disease Brother    Cancer Neg Hx    Diabetes Neg Hx    Breast cancer Neg Hx    Social History   Socioeconomic History   Marital status: Widowed    Spouse name: Not on file   Number of children: Not on file   Years of education: Not on file   Highest education level: Not on file  Occupational History   Not on file  Tobacco Use   Smoking status: Former    Current packs/day: 0.00    Types: Cigarettes    Quit date: 09/05/2005    Years since quitting: 17.8   Smokeless tobacco: Never  Vaping Use   Vaping status: Never Used  Substance and Sexual Activity   Alcohol use: Not Currently   Drug use: No   Sexual activity: Not Currently    Birth control/protection: Post-menopausal  Other Topics Concern   Not on file  Social History Narrative   Not on file   Social Drivers of Health   Financial Resource Strain: Not on file  Food Insecurity: Not on file  Transportation Needs: Not on file  Physical Activity: Not on file  Stress: Not on file  Social Connections: Not on file    Tobacco Counseling Counseling given: Not Answered   Clinical Intake:  Pre-visit preparation completed: Yes  Pain : No/denies pain      BMI - recorded: 17 Nutritional Status: BMI <19  Underweight Nutritional Risks: Other (Comment) (advanced age)  How often do you need to have someone help you when you read instructions, pamphlets, or other written materials from your doctor or pharmacy?: 1 - Never         Activities of Daily Living    07/09/2023    9:05 AM  In your present state of health, do you have any difficulty performing the following activities:  Hearing? 1  Comment not intrested in hearing aides  Vision? 0  Difficulty concentrating or making decisions? 1  Comment mild  Walking or climbing stairs? 0  Dressing or bathing? 0  Doing errands, shopping? 1  Comment does not drive  Preparing Food and eating ? N  Using the Toilet? N  In the past six months, have you accidently leaked urine? Y  Do you have problems with loss of bowel control? N  Managing your Medications? Y  Managing your Finances? N  Housekeeping or managing your Housekeeping? N    Patient Care Team: [name], MD as PCP - General (Family Medicine)  Indicate any recent Medical Services you may have received from other than [facility] providers in the past year (date may be approximate).     Assessment:   This is a routine wellness examination for [place].  Hearing/Vision screen No results found.   Goals Addressed   None    Depression Screen    01/14/2023    1:49 PM 11/14/2022    1:17 PM 07/31/2021    1:29 PM 07/16/2021   10:33 AM 04/03/2021    3:16 PM  PHQ 2/9 Scores  PHQ - 2 Score 0 0     PHQ- 9 Score          Information is confidential and restricted. Go to Review Flowsheets to unlock data.    Fall Risk    03/03/2023    3:50 PM 01/30/2023    3:20 PM 01/14/2023    1:48 PM 11/14/2022    1:17 PM 09/19/2022    2:02 PM  Fall Risk   Falls in the past year? 0 0 0 0 0  Number falls in past yr: 0 0 0 0 0  Injury with Fall? 0 0 0 0 0  Risk for fall due to : Impaired balance/gait No Fall Risks   No Fall Risks  Follow up  Falls evaluation completed Falls evaluation completed   Falls evaluation completed    MEDICARE RISK AT HOME: Medicare Risk at Home Any stairs in or around the home?: No Home free of loose throw rugs in walkways, pet beds, electrical cords, etc?: Yes Adequate lighting in your home to reduce risk of falls?: Yes Life alert?: Yes Use of a cane, walker or w/c?: Yes Grab bars in the bathroom?: Yes Shower chair or bench in shower?: Yes Elevated toilet seat or a handicapped toilet?: Yes  TIMED UP AND GO:  Was the test performed?  No    Cognitive Function:        Immunizations Immunization History  Administered Date(s) Administered   Influenza, High Dose Seasonal PF 05/21/2017, 05/07/2022   Influenza-Unspecified 05/09/2021, 05/15/2023   PFIZER SARS-COV-2 Pediatric Vaccination 5-40yrs 08/09/2019, 09/06/2019, 06/12/2020   PNEUMOCOCCAL CONJUGATE-20 02/11/2022   PPD Test 10/16/2020, 10/26/2020, 02/01/2022, 02/10/2022   Pneumococcal Conjugate-13 08/08/2018   Zoster Recombinant(Shingrix) 02/05/2023, 06/10/2023    TDAP status: Due, Education has been provided regarding the importance of this vaccine. Advised may receive this vaccine at local pharmacy or Health Dept. Aware to provide a copy of the vaccination record if obtained from local pharmacy or Health Dept. Verbalized acceptance and understanding.  Flu Vaccine status: Up to date  Pneumococcal vaccine status: Up to date  Covid-19 vaccine status: Information provided on how to obtain vaccines.   Qualifies for Shingles Vaccine? Yes   Zostavax completed No   Shingrix Completed?: Yes  Screening Tests Health Maintenance  Topic Date Due   DTaP/Tdap/Td (1 - Tdap) Never done   DEXA SCAN  Never done   COVID-19 Vaccine (1) 07/16/2023 (Originally 06/12/2020)   Medicare Annual  Wellness (AWV)  07/08/2024   Pneumonia Vaccine 31+ Years old  Completed   INFLUENZA VACCINE  Completed   Zoster Vaccines- Shingrix  Completed   HPV VACCINES   Aged Out    Health Maintenance  Health Maintenance Due  Topic Date Due   DTaP/Tdap/Td (1 - Tdap) Never done   DEXA SCAN  Never done    Colorectal cancer screening: No longer required.   Mammogram status: No longer required due to age.  Unsure if she wants to have test done  Lung Cancer Screening: (Low Dose CT Chest recommended if Age 28-80 years, 20 pack-year currently smoking OR have quit w/in 15years.) does not qualify.   Lung Cancer Screening Referral: na  Additional Screening:  Hepatitis C Screening: does not qualify  Vision Screening: Recommended annual ophthalmology exams for early detection of glaucoma and other disorders of the eye. Is the patient up to date with their annual eye exam?  Yes  Who is the provider or what is the name of the office in which the patient attends annual eye exams? [facility], going every 3 months.  If pt is not established with a provider, would they like to be referred to a provider to establish care? No .   Dental Screening: Recommended annual dental exams for proper oral hygiene   Community Resource Referral / Chronic Care Management: CRR required this visit?  No   CCM required this visit?  No     Plan:     I have personally reviewed and noted the following in the patient's chart:   Medical and social history Use of alcohol, tobacco or illicit drugs  Current medications and supplements including opioid prescriptions. Patient is not currently taking opioid prescriptions. Functional ability and status Nutritional status Physical activity Advanced directives List of other physicians Hospitalizations, surgeries, and ER visits in previous 12 months Vitals Screenings to include cognitive, depression, and falls Referrals and appointments  In addition, I have reviewed and discussed with patient certain preventive protocols, quality metrics, and best practice recommendations. A written personalized care plan for preventive  services as well as general preventive health recommendations were provided to patient.     Sharon Seller, NP   07/09/2023

## 2023-10-17 ENCOUNTER — Inpatient Hospital Stay
Admission: EM | Admit: 2023-10-17 | Discharge: 2023-10-21 | DRG: 291 | Disposition: A | Discharge status: Skilled Nursing Facility | Source: Skilled Nursing Facility | Attending: Student in an Organized Health Care Education/Training Program | Admitting: Student in an Organized Health Care Education/Training Program

## 2023-10-17 ENCOUNTER — Emergency Department

## 2023-10-17 ENCOUNTER — Other Ambulatory Visit: Payer: Self-pay

## 2023-10-17 DIAGNOSIS — I110 Hypertensive heart disease with heart failure: Secondary | ICD-10-CM | POA: Diagnosis present

## 2023-10-17 DIAGNOSIS — E782 Mixed hyperlipidemia: Secondary | ICD-10-CM | POA: Diagnosis present

## 2023-10-17 DIAGNOSIS — Z79899 Other long term (current) drug therapy: Secondary | ICD-10-CM

## 2023-10-17 DIAGNOSIS — I083 Combined rheumatic disorders of mitral, aortic and tricuspid valves: Secondary | ICD-10-CM | POA: Diagnosis present

## 2023-10-17 DIAGNOSIS — H3530 Unspecified macular degeneration: Secondary | ICD-10-CM | POA: Diagnosis present

## 2023-10-17 DIAGNOSIS — J439 Emphysema, unspecified: Secondary | ICD-10-CM | POA: Diagnosis present

## 2023-10-17 DIAGNOSIS — J441 Chronic obstructive pulmonary disease with (acute) exacerbation: Secondary | ICD-10-CM | POA: Diagnosis present

## 2023-10-17 DIAGNOSIS — E43 Unspecified severe protein-calorie malnutrition: Secondary | ICD-10-CM | POA: Diagnosis not present

## 2023-10-17 DIAGNOSIS — E871 Hypo-osmolality and hyponatremia: Secondary | ICD-10-CM | POA: Diagnosis present

## 2023-10-17 DIAGNOSIS — I5A Non-ischemic myocardial injury (non-traumatic): Secondary | ICD-10-CM | POA: Diagnosis present

## 2023-10-17 DIAGNOSIS — D72829 Elevated white blood cell count, unspecified: Secondary | ICD-10-CM | POA: Diagnosis present

## 2023-10-17 DIAGNOSIS — N179 Acute kidney failure, unspecified: Secondary | ICD-10-CM | POA: Diagnosis not present

## 2023-10-17 DIAGNOSIS — J9819 Other pulmonary collapse: Secondary | ICD-10-CM | POA: Diagnosis present

## 2023-10-17 DIAGNOSIS — Z85828 Personal history of other malignant neoplasm of skin: Secondary | ICD-10-CM

## 2023-10-17 DIAGNOSIS — E785 Hyperlipidemia, unspecified: Secondary | ICD-10-CM | POA: Diagnosis present

## 2023-10-17 DIAGNOSIS — Z87891 Personal history of nicotine dependence: Secondary | ICD-10-CM | POA: Diagnosis not present

## 2023-10-17 DIAGNOSIS — Z8249 Family history of ischemic heart disease and other diseases of the circulatory system: Secondary | ICD-10-CM | POA: Diagnosis not present

## 2023-10-17 DIAGNOSIS — I2489 Other forms of acute ischemic heart disease: Secondary | ICD-10-CM | POA: Diagnosis present

## 2023-10-17 DIAGNOSIS — I700 Atherosclerosis of aorta: Secondary | ICD-10-CM | POA: Diagnosis present

## 2023-10-17 DIAGNOSIS — Z7982 Long term (current) use of aspirin: Secondary | ICD-10-CM

## 2023-10-17 DIAGNOSIS — Z7901 Long term (current) use of anticoagulants: Secondary | ICD-10-CM | POA: Diagnosis not present

## 2023-10-17 DIAGNOSIS — I2510 Atherosclerotic heart disease of native coronary artery without angina pectoris: Secondary | ICD-10-CM | POA: Diagnosis present

## 2023-10-17 DIAGNOSIS — J9601 Acute respiratory failure with hypoxia: Secondary | ICD-10-CM | POA: Diagnosis present

## 2023-10-17 DIAGNOSIS — Z1152 Encounter for screening for COVID-19: Secondary | ICD-10-CM

## 2023-10-17 DIAGNOSIS — Z881 Allergy status to other antibiotic agents status: Secondary | ICD-10-CM | POA: Diagnosis not present

## 2023-10-17 DIAGNOSIS — I5031 Acute diastolic (congestive) heart failure: Secondary | ICD-10-CM | POA: Diagnosis present

## 2023-10-17 DIAGNOSIS — R001 Bradycardia, unspecified: Secondary | ICD-10-CM | POA: Diagnosis not present

## 2023-10-17 DIAGNOSIS — E039 Hypothyroidism, unspecified: Secondary | ICD-10-CM | POA: Diagnosis present

## 2023-10-17 DIAGNOSIS — I480 Paroxysmal atrial fibrillation: Secondary | ICD-10-CM | POA: Diagnosis present

## 2023-10-17 DIAGNOSIS — Z820 Family history of epilepsy and other diseases of the nervous system: Secondary | ICD-10-CM

## 2023-10-17 DIAGNOSIS — I509 Heart failure, unspecified: Secondary | ICD-10-CM | POA: Diagnosis not present

## 2023-10-17 DIAGNOSIS — R0602 Shortness of breath: Secondary | ICD-10-CM | POA: Diagnosis present

## 2023-10-17 DIAGNOSIS — Z825 Family history of asthma and other chronic lower respiratory diseases: Secondary | ICD-10-CM

## 2023-10-17 DIAGNOSIS — J449 Chronic obstructive pulmonary disease, unspecified: Secondary | ICD-10-CM

## 2023-10-17 DIAGNOSIS — I4891 Unspecified atrial fibrillation: Secondary | ICD-10-CM | POA: Diagnosis not present

## 2023-10-17 LAB — BASIC METABOLIC PANEL
Anion gap: 10 (ref 5–15)
BUN: 19 mg/dL (ref 8–23)
CO2: 27 mmol/L (ref 22–32)
Calcium: 8.1 mg/dL — ABNORMAL LOW (ref 8.9–10.3)
Chloride: 94 mmol/L — ABNORMAL LOW (ref 98–111)
Creatinine, Ser: 0.57 mg/dL (ref 0.44–1.00)
GFR, Estimated: >60 mL/min (ref >60)
Glucose, Bld: 121 mg/dL — ABNORMAL HIGH (ref 70–99)
Potassium: 3.9 mmol/L (ref 3.5–5.1)
Sodium: 131 mmol/L — ABNORMAL LOW (ref 135–145)

## 2023-10-17 LAB — COMPREHENSIVE METABOLIC PANEL
ALT: 39 U/L (ref 0–44)
AST: 30 U/L (ref 15–41)
Albumin: 3.2 g/dL — ABNORMAL LOW (ref 3.5–5.0)
Alkaline Phosphatase: 70 U/L (ref 38–126)
Anion gap: 8 (ref 5–15)
BUN: 21 mg/dL (ref 8–23)
CO2: 26 mmol/L (ref 22–32)
Calcium: 7.9 mg/dL — ABNORMAL LOW (ref 8.9–10.3)
Chloride: 95 mmol/L — ABNORMAL LOW (ref 98–111)
Creatinine, Ser: 0.62 mg/dL (ref 0.44–1.00)
GFR, Estimated: >60 mL/min (ref >60)
Glucose, Bld: 142 mg/dL — ABNORMAL HIGH (ref 70–99)
Potassium: 4.2 mmol/L (ref 3.5–5.1)
Sodium: 129 mmol/L — ABNORMAL LOW (ref 135–145)
Total Bilirubin: 0.9 mg/dL (ref 0.0–1.2)
Total Protein: 6.4 g/dL — ABNORMAL LOW (ref 6.5–8.1)

## 2023-10-17 LAB — PHOSPHORUS: Phosphorus: 3.2 mg/dL (ref 2.5–4.6)

## 2023-10-17 LAB — TROPONIN I (HIGH SENSITIVITY)
Troponin I (High Sensitivity): 46 ng/L — ABNORMAL HIGH (ref <18)
Troponin I (High Sensitivity): 60 ng/L — ABNORMAL HIGH (ref <18)
Troponin I (High Sensitivity): 54 ng/L — ABNORMAL HIGH (ref <18)

## 2023-10-17 LAB — CBC
HCT: 34.4 % — ABNORMAL LOW (ref 36.0–46.0)
Hemoglobin: 10.9 g/dL — ABNORMAL LOW (ref 12.0–15.0)
MCH: 32.2 pg (ref 26.0–34.0)
MCHC: 31.7 g/dL (ref 30.0–36.0)
MCV: 101.8 fL — ABNORMAL HIGH (ref 80.0–100.0)
Platelets: 231 10*3/uL (ref 150–400)
RBC: 3.38 MIL/uL — ABNORMAL LOW (ref 3.87–5.11)
RDW: 13.9 % (ref 11.5–15.5)
WBC: 11.7 10*3/uL — ABNORMAL HIGH (ref 4.0–10.5)
nRBC: 0 % (ref 0.0–0.2)

## 2023-10-17 LAB — T4, FREE: Free T4: 0.88 ng/dL (ref 0.61–1.12)

## 2023-10-17 LAB — PROTIME-INR
INR: 1.1 (ref 0.8–1.2)
Prothrombin Time: 14.8 s (ref 11.4–15.2)

## 2023-10-17 LAB — SARS CORONAVIRUS 2 BY RT PCR: SARS Coronavirus 2 by RT PCR: NEGATIVE

## 2023-10-17 LAB — BRAIN NATRIURETIC PEPTIDE: B Natriuretic Peptide: 667.2 pg/mL — ABNORMAL HIGH (ref 0.0–100.0)

## 2023-10-17 LAB — MAGNESIUM: Magnesium: 2.3 mg/dL (ref 1.7–2.4)

## 2023-10-17 LAB — TSH: TSH: 0.968 u[IU]/mL (ref 0.350–4.500)

## 2023-10-17 MED ORDER — PRAVASTATIN SODIUM 20 MG PO TABS
20.0000 mg | ORAL_TABLET | Freq: Every day | ORAL | Status: DC
  Administered 2023-10-18 – 2023-10-20 (×3): 20 mg via ORAL
  Filled 2023-10-17 (×4): qty 1

## 2023-10-17 MED ORDER — IPRATROPIUM BROMIDE 0.02 % IN SOLN
0.5000 mg | RESPIRATORY_TRACT | Status: DC
  Administered 2023-10-17 – 2023-10-18 (×6): 0.5 mg via RESPIRATORY_TRACT
  Filled 2023-10-17 (×6): qty 2.5

## 2023-10-17 MED ORDER — AMIODARONE HCL IN DEXTROSE 360-4.14 MG/200ML-% IV SOLN
30.0000 mg/h | INTRAVENOUS | Status: AC
  Administered 2023-10-18 – 2023-10-19 (×2): 30 mg/h via INTRAVENOUS
  Filled 2023-10-17 (×3): qty 200

## 2023-10-17 MED ORDER — IOHEXOL 350 MG/ML SOLN
60.0000 mL | Freq: Once | INTRAVENOUS | Status: AC | PRN
  Administered 2023-10-17: 60 mL via INTRAVENOUS

## 2023-10-17 MED ORDER — ARIPIPRAZOLE 2 MG PO TABS
2.0000 mg | ORAL_TABLET | Freq: Every day | ORAL | Status: DC
  Administered 2023-10-18 – 2023-10-21 (×4): 2 mg via ORAL
  Filled 2023-10-17 (×4): qty 1

## 2023-10-17 MED ORDER — ALBUTEROL SULFATE (2.5 MG/3ML) 0.083% IN NEBU: 2.5000 mg | INHALATION_SOLUTION | Freq: Four times a day (QID) | RESPIRATORY_TRACT | Status: DC | PRN

## 2023-10-17 MED ORDER — ASPIRIN 81 MG PO TBEC
81.0000 mg | DELAYED_RELEASE_TABLET | Freq: Every day | ORAL | Status: DC
  Administered 2023-10-17 – 2023-10-21 (×5): 81 mg via ORAL
  Filled 2023-10-17 (×5): qty 1

## 2023-10-17 MED ORDER — AMIODARONE HCL IN DEXTROSE 360-4.14 MG/200ML-% IV SOLN
60.0000 mg/h | INTRAVENOUS | Status: AC
  Administered 2023-10-17 (×2): 60 mg/h via INTRAVENOUS
  Filled 2023-10-17: qty 200

## 2023-10-17 MED ORDER — DOXYCYCLINE HYCLATE 100 MG PO TABS
100.0000 mg | ORAL_TABLET | Freq: Two times a day (BID) | ORAL | Status: DC
  Administered 2023-10-17 – 2023-10-21 (×8): 100 mg via ORAL
  Filled 2023-10-17 (×8): qty 1

## 2023-10-17 MED ORDER — HEPARIN (PORCINE) 25000 UT/250ML-% IV SOLN
600.0000 [IU]/h | INTRAVENOUS | Status: DC
  Administered 2023-10-17 – 2023-10-19 (×2): 600 [IU]/h via INTRAVENOUS
  Filled 2023-10-17 (×2): qty 250

## 2023-10-17 MED ORDER — MAGNESIUM HYDROXIDE 400 MG/5ML PO SUSP: 5.0000 mL | Freq: Every day | ORAL | Status: DC | PRN

## 2023-10-17 MED ORDER — HEPARIN BOLUS VIA INFUSION
2150.0000 [IU] | Freq: Once | INTRAVENOUS | Status: AC
  Administered 2023-10-17: 2150 [IU] via INTRAVENOUS
  Filled 2023-10-17: qty 2150

## 2023-10-17 MED ORDER — DILTIAZEM HCL-DEXTROSE 125-5 MG/125ML-% IV SOLN (PREMIX)
5.0000 mg/h | INTRAVENOUS | Status: DC
  Administered 2023-10-17: 5 mg/h via INTRAVENOUS
  Filled 2023-10-17: qty 125

## 2023-10-17 MED ORDER — FUROSEMIDE 10 MG/ML IJ SOLN
20.0000 mg | Freq: Two times a day (BID) | INTRAMUSCULAR | Status: DC
  Administered 2023-10-18 – 2023-10-19 (×3): 20 mg via INTRAVENOUS
  Filled 2023-10-17: qty 4
  Filled 2023-10-17 (×2): qty 2

## 2023-10-17 MED ORDER — DILTIAZEM HCL 25 MG/5ML IV SOLN
10.0000 mg | INTRAVENOUS | Status: AC
Start: 2023-10-17 — End: 2023-10-17
  Administered 2023-10-17: 10 mg via INTRAVENOUS
  Filled 2023-10-17: qty 5

## 2023-10-17 MED ORDER — NYSTATIN 100000 UNIT/GM EX POWD
1.0000 | Freq: Two times a day (BID) | CUTANEOUS | Status: DC
  Administered 2023-10-20 – 2023-10-21 (×2): 1 via TOPICAL
  Filled 2023-10-17 (×2): qty 15

## 2023-10-17 MED ORDER — ONDANSETRON HCL 4 MG/2ML IJ SOLN
4.0000 mg | Freq: Three times a day (TID) | INTRAMUSCULAR | Status: DC | PRN
Start: 2023-10-17 — End: 2023-10-21

## 2023-10-17 MED ORDER — ACETAMINOPHEN 325 MG PO TABS
650.0000 mg | ORAL_TABLET | Freq: Four times a day (QID) | ORAL | Status: DC | PRN
Start: 2023-10-17 — End: 2023-10-21

## 2023-10-17 MED ORDER — LEVALBUTEROL TARTRATE 45 MCG/ACT IN AERO: 2.0000 | INHALATION_SPRAY | Freq: Four times a day (QID) | RESPIRATORY_TRACT | Status: DC | PRN

## 2023-10-17 MED ORDER — SODIUM CHLORIDE 1 G PO TABS: 1.0000 g | ORAL_TABLET | Freq: Two times a day (BID) | ORAL | Status: DC

## 2023-10-17 MED ORDER — HYDRALAZINE HCL 20 MG/ML IJ SOLN: 5.0000 mg | INTRAMUSCULAR | Status: DC | PRN

## 2023-10-17 MED ORDER — OMEGA-3-ACID ETHYL ESTERS 1 G PO CAPS
1.0000 g | ORAL_CAPSULE | Freq: Every day | ORAL | Status: DC
  Administered 2023-10-18 – 2023-10-21 (×4): 1 g via ORAL
  Filled 2023-10-17 (×4): qty 1

## 2023-10-17 MED ORDER — SODIUM CHLORIDE 1 G PO TABS
1.0000 g | ORAL_TABLET | Freq: Two times a day (BID) | ORAL | Status: DC
  Administered 2023-10-17: 1 g via ORAL
  Filled 2023-10-17 (×2): qty 1

## 2023-10-17 MED ORDER — ALUM & MAG HYDROXIDE-SIMETH 200-200-20 MG/5ML PO SUSP: 5.0000 mL | ORAL | Status: DC | PRN

## 2023-10-17 MED ORDER — METHYLPREDNISOLONE SODIUM SUCC 40 MG IJ SOLR
40.0000 mg | Freq: Two times a day (BID) | INTRAMUSCULAR | Status: DC
  Administered 2023-10-17 – 2023-10-21 (×8): 40 mg via INTRAVENOUS
  Filled 2023-10-17 (×8): qty 1

## 2023-10-17 MED ORDER — AMIODARONE LOAD VIA INFUSION
150.0000 mg | Freq: Once | INTRAVENOUS | Status: AC
  Administered 2023-10-17: 150 mg via INTRAVENOUS
  Filled 2023-10-17: qty 83.34

## 2023-10-17 MED ORDER — FUROSEMIDE 10 MG/ML IJ SOLN
20.0000 mg | Freq: Once | INTRAMUSCULAR | Status: AC
  Administered 2023-10-17: 20 mg via INTRAVENOUS
  Filled 2023-10-17: qty 4

## 2023-10-17 MED ORDER — PROSIGHT PO TABS
1.0000 | ORAL_TABLET | Freq: Two times a day (BID) | ORAL | Status: DC
  Administered 2023-10-18 – 2023-10-21 (×7): 1 via ORAL
  Filled 2023-10-17 (×8): qty 1

## 2023-10-17 MED ORDER — ENSURE ENLIVE PO LIQD
237.0000 mL | Freq: Two times a day (BID) | ORAL | Status: DC
  Administered 2023-10-19: 237 mL via ORAL

## 2023-10-17 MED ORDER — POLYVINYL ALCOHOL 1.4 % OP SOLN
1.0000 [drp] | Freq: Every day | OPHTHALMIC | Status: DC
  Filled 2023-10-17 (×2): qty 15

## 2023-10-17 MED ORDER — DM-GUAIFENESIN ER 30-600 MG PO TB12: 1.0000 | ORAL_TABLET | Freq: Two times a day (BID) | ORAL | Status: DC | PRN

## 2023-10-17 MED ORDER — ERYTHROMYCIN 5 MG/GM OP OINT
1.0000 | TOPICAL_OINTMENT | Freq: Every day | OPHTHALMIC | Status: DC
  Administered 2023-10-17 – 2023-10-18 (×2): 1 via OPHTHALMIC
  Filled 2023-10-17 (×2): qty 1

## 2023-10-17 NOTE — H&P (Addendum)
 History and Physical    Angela Mason ZOX:[id number] DOB: 1933/03/11 DOA: 10/17/2023  Referring MD/NP/PA:   PCP: [name], MD   Patient coming from:  The patient is coming from home.     Chief Complaint: SOB  HPI: Angela Mason is a 89 y.o. female with medical history significant of HTN, HLD, COPD, hypothyroidism, cystocele, who presents with SOB.  Patient states that she has SOB in the past several days, which is worsening today.  Patient has mild dry cough, no chest pain.  No fever or chills.  She has palpitation and heart racing.  Denies nausea, vomiting, diarrhea or abdominal pain.  No symptoms of UTI.  Denies recent fall and head injury.  No rectal bleeding or dark stool today.  Patient has bilateral lower leg edema.  Patient was found to have new onset A-fib with heart rates up to 150-170 in ED.  Patient was given 1 dose of Cardizem 10 mg and then started amiodarone drip in ED.  Current heart rate 120-130.  Patient was found to have moderate acute respiratory distress with tachypnea, RR up to 40 --> 21, with oxygen desaturation to 86% on room air, which improved to 100% on 4L --> then 3L O2 in ED (not using oxygen normally).    Data reviewed independently and ED Course: pt was found to have troponin 46 --> 60, WBC 11.7, GFR> 60, sodium 129, potassium 4.2, magnesium 2.3, phosphorus 3.2, TSH 0.968, free T40.88.  Temperature normal, blood pressure 113/89, RR 40 --> 21, chest x-ray showed mild cardiomegaly and small bilateral pleural effusion.  Patient is admitted to PCU as inpatient.  Dr. Welton Flakes of cardiology is consulted.  CTA: 1.  No evidence of pulmonary embolism. 2. Mild multifocal degradation as detailed above. 3. Small bilateral pleural effusions. Findings of elevated right heart pressures. Suspect a component of fluid overload. 4. Right middle lobe endobronchial obstruction or compression at the level of the proximal subsegmental bronchi resulting in right middle lobe  collapse. No well-defined obstructive mass. Consider further evaluation with bronchoscopy with attention to the right middle lobe bronchus. Alternatively, especially given patient age and comorbidities, CT follow-up at 6 months could be performed. 5. Aortic atherosclerosis (ICD10-I70.0), coronary artery atherosclerosis and emphysema (ICD10-J43.   EKG: I have personally reviewed.  A-fib, heart rate 131, low voltage, nonspecific T wave change.   Review of Systems:   General: no fevers, chills, no body weight gain, has fatigue HEENT: no blurry vision, hearing changes or sore throat Respiratory: has dyspnea, coughing, wheezing CV: no chest pain, has palpitations GI: no nausea, vomiting, abdominal pain, diarrhea, constipation GU: no dysuria, burning on urination, increased urinary frequency, hematuria  Ext: has leg edema Neuro: no unilateral weakness, numbness, or tingling, no vision change or hearing loss Skin: no rash, no skin tear. MSK: No muscle spasm, no deformity, no limitation of range of movement in spin Heme: No easy bruising.  Travel history: No recent long distant travel.   Allergy:  Allergies  Allergen Reactions   Gentamicin    Levofloxacin Diarrhea    Past Medical History:  Diagnosis Date   Cancer (HCC)    SKIN   COPD, mild (HCC)    Dyspnea    Edema    HLD (hyperlipidemia)    HTN (hypertension)    Hypothyroidism    Macular degeneration, bilateral    Varicosities     Past Surgical History:  Procedure Laterality Date   CATARACT EXTRACTION W/PHACO Right 02/03/2017   Procedure: CATARACT EXTRACTION  PHACO AND INTRAOCULAR LENS PLACEMENT (IOC);  Surgeon: Galen Manila, MD;  Location: [facility] ORS;  Service: Ophthalmology;  Laterality: Right;  [place] 00:57.2 AP% 18.5 CDE 10.58 Fluid pack lot # [id number]   CATARACT EXTRACTION W/PHACO Left 02/24/2017   Procedure: CATARACT EXTRACTION PHACO AND INTRAOCULAR LENS PLACEMENT (IOC);  Surgeon: Galen Manila, MD;  Location:  [facility] ORS;  Service: Ophthalmology;  Laterality: Left;  [place] 01:12 AP% 25.0 CDE 18.03 Fluid pack lot # [id number]   MOHS SURGERY  2015   nose   SALIVARY GLAND SURGERY Left 2012   TONSILLECTOMY  1940    Social History:  reports that she quit smoking about 18 years ago. Her smoking use included cigarettes. She has never used smokeless tobacco. She reports that she does not currently use alcohol. She reports that she does not use drugs.  Family History:  Family History  Problem Relation Age of Onset   Diverticulitis Mother    Macular degeneration Mother    COPD Father    Heart failure Father    Epilepsy Sister    Parkinson's disease Brother    Cancer Neg Hx    Diabetes Neg Hx    Breast cancer Neg Hx      Prior to Admission medications   Medication Sig Start Date End Date Taking? Authorizing Provider  acetaminophen (TYLENOL) 325 MG tablet Take 650 mg by mouth every 4 (four) hours as needed.    [provider]  aluminum-magnesium hydroxide 200-200 MG/5ML suspension Take 5 mLs by mouth every 4 (four) hours as needed for indigestion.    [provider]  ARIPiprazole (ABILIFY) 2 MG tablet Take 1 tablet (2 mg total) by mouth daily with breakfast. 01/31/23   [name], MD  bismuth subsalicylate (PEPTO BISMOL) 262 MG/15ML suspension Take 30 mLs by mouth as needed.    [provider]  carbamide peroxide (DEBROX) 6.5 % OTIC solution Place 5 drops into both ears as needed.    [provider]  cetirizine (ZYRTEC) 5 MG tablet Take 5 mg by mouth daily as needed for allergies.    [provider]  chlorhexidine (PERIDEX) 0.12 % solution Give 0.5 applicatorful by mouth two times a day for crown lengthening procedure unsupervised self-administration after breakfast and before bed    [provider]  dextromethorphan-guaiFENesin (ROBITUSSIN-DM) 10-100 MG/5ML liquid Take 5 mLs by mouth every 4 (four) hours as needed for cough.    [provider]  dextrose (GLUTOSE) 40 % GEL Take 1 Tube by mouth as needed for low blood sugar.    [provider]  estradiol (ESTRACE) 0.1 MG/GM vaginal cream Place 1 Applicatorful vaginally 2 (two) times a week. 07/02/23   Hildred Laser, MD  Lactobacillus Rhamnosus, GG, (CULTURELLE) CAPS Take 1 capsule by mouth daily.    [provider]  losartan (COZAAR) 50 MG tablet Take 1 tablet (50 mg total) by mouth daily. 09/12/22   [name], MD  lovastatin (MEVACOR) 20 MG tablet Take 1 tablet (20 mg total) by mouth at bedtime. 10/17/22 10/17/23  [name], MD  magnesium hydroxide (MILK OF MAGNESIA) 400 MG/5ML suspension Take 5 mLs by mouth daily as needed for mild constipation.    [provider]  Multiple Vitamins-Minerals (PRESERVISION AREDS) TABS Take 1 capsule by mouth 2 (two) times daily.    [provider]  nystatin (MYCOSTATIN/NYSTOP) powder Apply 1 Application topically 2 (two) times daily.    [provider]  Omega-3 Fatty Acids (FISH OIL CONCENTRATE PO) Take  1 tablet by mouth daily.     [provider]  ondansetron (ZOFRAN) 4 MG tablet Take 4 mg by mouth every 8 (eight) hours as needed for nausea or vomiting.    [provider]  OXYGEN 2 lpm for dyspnea or SOB    [provider]  Polyvinyl Alcohol-Povidone PF (REFRESH) 1.4-0.6 % SOLN Place 1 drop into both eyes in the morning and at bedtime.    [provider]    Physical Exam: Vitals:   10/17/23 1930 10/17/23 2019 10/17/23 2055 10/17/23 2105  BP: (!) 127/94     Pulse: (!) 136  (!) 139 (!) 136  Resp: (!) 36 (!) 37 20 20  Temp:      TempSrc:      SpO2: 99% 99% 100% 100%  Weight:      Height:       General: Not in acute distress HEENT:       Eyes: PERRL, EOMI, no jaundice       ENT: No discharge from the ears and nose, no pharynx injection, no tonsillar enlargement.        Neck: has positive JVD, no bruit, no mass felt. Heme: No neck lymph node  enlargement. Cardiac: S1/S2, irregularly irregular rhythm, no murmurs, No gallops or rubs. Respiratory: has wheezing bilaterally, has crackles bilaterally GI: Soft, nondistended, nontender, no rebound pain, no organomegaly, BS present. GU: No hematuria Ext: 2+ pitting leg edema bilaterally. 1+DP/PT pulse bilaterally. Musculoskeletal: No joint deformities, No joint redness or warmth, no limitation of ROM in spin. Skin: No rashes.  Neuro: Alert, oriented X3, cranial nerves II-XII grossly intact, moves all extremities normally. Psych: Patient is not psychotic, no suicidal or hemocidal ideation.  Labs on Admission: I have personally reviewed following labs and imaging studies  CBC: Recent Labs  Lab 10/17/23 1502  WBC 11.7*  HGB 10.9*  HCT 34.4*  MCV 101.8*  PLT 231   Basic Metabolic Panel: Recent Labs  Lab 10/17/23 1502 10/17/23 1638  NA 129*  --   K 4.2  --   CL 95*  --   CO2 26  --   GLUCOSE 142*  --   BUN 21  --   CREATININE 0.62  --   CALCIUM 7.9*  --   MG  --  2.3  PHOS  --  3.2   GFR: Estimated Creatinine Clearance: 33.1 mL/min (by C-G formula based on SCr of 0.62 mg/dL). Liver Function Tests: Recent Labs  Lab 10/17/23 1502  AST 30  ALT 39  ALKPHOS 70  BILITOT 0.9  PROT 6.4*  ALBUMIN 3.2*   No results for input(s): "LIPASE", "AMYLASE" in the last 168 hours. No results for input(s): "AMMONIA" in the last 168 hours. Coagulation Profile: No results for input(s): "INR", "PROTIME" in the last 168 hours. Cardiac Enzymes: No results for input(s): "CKTOTAL", "CKMB", "CKMBINDEX", "TROPONINI" in the last 168 hours. BNP (last 3 results) No results for input(s): "PROBNP" in the last 8760 hours. HbA1C: No results for input(s): "HGBA1C" in the last 72 hours. CBG: No results for input(s): "GLUCAP" in the last 168 hours. Lipid Profile: No results for input(s): "CHOL", "HDL", "LDLCALC", "TRIG", "CHOLHDL", "LDLDIRECT" in the last 72 hours. Thyroid Function  Tests: Recent Labs    10/17/23 1502 10/17/23 1638  TSH 0.968  --   FREET4  --  0.88   Anemia Panel: No results for input(s): "VITAMINB12", "FOLATE", "FERRITIN", "TIBC", "IRON", "RETICCTPCT" in the last 72 hours. Urine analysis:  Component Value Date/Time   COLORURINE YELLOW (A) 01/28/2022 1254   APPEARANCEUR Hazy (A) 02/25/2023 1316   LABSPEC 1.011 01/28/2022 1254   PHURINE 5.0 01/28/2022 1254   GLUCOSEU Negative 02/25/2023 1316   HGBUR MODERATE (A) 01/28/2022 1254   BILIRUBINUR Negative 02/25/2023 1316   KETONESUR NEGATIVE 01/28/2022 1254   PROTEINUR Negative 02/25/2023 1316   PROTEINUR NEGATIVE 01/28/2022 1254   UROBILINOGEN 0.2 09/19/2022 1544   NITRITE Negative 02/25/2023 1316   NITRITE POSITIVE (A) 01/28/2022 1254   LEUKOCYTESUR 2+ (A) 02/25/2023 1316   LEUKOCYTESUR LARGE (A) 01/28/2022 1254   Sepsis Labs: @LABRCNTIP (procalcitonin:4,lacticidven:4) ) Recent Results (from the past 240 hours)  SARS Coronavirus 2 by RT PCR (hospital order, performed in [facility] hospital lab) *cepheid single result test* Nasopharyngeal Swab     Status: None   Collection Time: 10/17/23  7:30 PM   Specimen: Nasopharyngeal Swab; Nasal Swab  Result Value Ref Range Status   SARS Coronavirus 2 by RT PCR NEGATIVE NEGATIVE Final    Comment: (NOTE) SARS-CoV-2 target nucleic acids are NOT DETECTED.  The SARS-CoV-2 RNA is generally detectable in upper and lower respiratory specimens during the acute phase of infection. The lowest concentration of SARS-CoV-2 viral copies this assay can detect is 250 copies / mL. A negative result does not preclude SARS-CoV-2 infection and should not be used as the sole basis for treatment or other patient management decisions.  A negative result may occur with improper specimen collection / handling, submission of specimen other than nasopharyngeal swab, presence of viral mutation(s) within the areas targeted by this assay, and inadequate number of viral  copies (<250 copies / mL). A negative result must be combined with clinical observations, patient history, and epidemiological information.  Fact Sheet for Patients:   RoadLapTop.co.za  Fact Sheet for Healthcare Providers: http://kim-miller.com/  This test is not yet approved or  cleared by the [place] FDA and has been authorized for detection and/or diagnosis of SARS-CoV-2 by FDA under an Emergency Use Authorization (EUA).  This EUA will remain in effect (meaning this test can be used) for the duration of the COVID-19 declaration under Section 564(b)(1) of the Act, 21 U.S.C. section 360bbb-3(b)(1), unless the authorization is terminated or revoked sooner.  Performed at [facility]: [address] [address], [address] [place], [place] [address]      Radiological Exams on Admission:   Assessment/Plan Principal Problem:   Acute CHF [facility]) Active Problems:   Myocardial injury   Atrial fibrillation with RVR (HCC)   COPD exacerbation (HCC)   Acute respiratory failure with hypoxia (HCC)   HLD (hyperlipidemia)   Hypothyroidism   Hyponatremia   Protein-calorie malnutrition, severe   Assessment and Plan:   Acute CHF [facility]): Patient has SOB, elevated BNP 667 and 2+ leg edema, clinically consistent with acute CHF.  No 2D echo on record, not sure which type of CHF.  Given long history of hypertension, patient may have diastolic CHF.  Will get 2D echo for further evaluation.  Consulted Dr. Welton Flakes of cardiology.  -Will admit to PCU as inpatient -Lasix 20 mg bid by IV -2d echo -Daily weights -strict I/O's -Low salt diet -Fluid restriction -As needed bronchodilators for shortness of breath  Myocardial injury: trop  46 --> 60.  Likely demand ischemia. -Trend troponin -Follow-up 2D echo -Aspirin and pravastatin  New onset atrial fibrillation with RVR (HCC): CHADS2 score is 5, will need chronic anticoagulants.  TSH and T4 normal. -Started  IV heparin -Amiodarone drip -Follow-up cardiologist recommendation  COPD exacerbation [facility]): Patient has wheezing on auscultation, indicating COPD exacerbation.  Patient has mild leukocytosis, but no infiltration on chest x-ray. -Bronchodilators -Doxycycline 100 mg twice daily -Solu-Medrol 40 mg twice daily -As needed Mucinex  Acute respiratory failure with hypoxia [facility]): Patient has 3-4 L new oxygen requirement.  Likely due to combination of acute CHF and COPD exacerbation. -Bronchodilators and as needed Mucinex -Treat COPD and CHF as above -Nasal cannula oxygen to maintain oxygen saturation above 93% -check COVID-19 and RVP  HLD (hyperlipidemia) -Pravastatin (patient is on lovastatin at home)  Hyponatremia: Sodium 129. -Fluid restriction -Sodium chloride tablet 1 g of twice daily -Every 6 hour BMP  Protein-calorie malnutrition, severe: Body weight 43.1 kg, BMI 18.55 -Ensure -Nutrition consult  Right middle lobe collapse:  this is incidental finding by CTA. CTA showed right middle lobe endobronchial obstruction or compression at the level of the proximal subsegmental bronchi resulting in right middle lobe collapse. No well-defined obstructive mass.  -may need to consult pulmonology for possible bronchoscopy or alternatively follow-up CT scan in 3-6 months.    DVT ppx: on IV heparin     Code Status: Full code per pt  Family Communication:     not done, no family member is at bed side.  Patient states that her daughter is in [place].  Disposition Plan:  Anticipate discharge back to previous environment  Consults called:  Dr. Welton Flakes of cardiology is consulted.  Admission status and Level of care: Progressive:  as inpt  Dispo: The patient is from: Home              Anticipated d/c is to: Home              Anticipated d/c date is: 2 days              Patient currently is not medically stable to d/c.    Severity of Illness:  The appropriate patient status for this  patient is INPATIENT. Inpatient status is judged to be reasonable and necessary in order to provide the required intensity of service to ensure the patient's safety. The patient's presenting symptoms, physical exam findings, and initial radiographic and laboratory data in the context of their chronic comorbidities is felt to place them at high risk for further clinical deterioration. Furthermore, it is not anticipated that the patient will be medically stable for discharge from the hospital within 2 midnights of admission.   * I certify that at the point of admission it is my clinical judgment that the patient will require inpatient hospital care spanning beyond 2 midnights from the point of admission due to high intensity of service, high risk for further deterioration and high frequency of surveillance required.*       Date of Service 10/17/2023    Lorretta Harp [facility]   If 7PM-7AM, please contact night-coverage [email or web address] 10/17/2023, 10:16 PM

## 2023-10-17 NOTE — Progress Notes (Signed)
 PHARMACY - ANTICOAGULATION CONSULT NOTE  Pharmacy Consult for heparin Indication: atrial fibrillation  Allergies  Allergen Reactions   Gentamicin    Levofloxacin Diarrhea    Patient Measurements: Height: 5' (152.4 cm) Weight: 43.1 kg (95 lb) IBW/kg (Calculated) : 45.5 Heparin Dosing Weight: 43.1 kg  Vital Signs: Temp: 97.9 F (36.6 C) (03/22 1455) Temp Source: Oral (03/22 1455) BP: 113/89 (03/22 1750) Pulse Rate: 146 (03/22 1750)  Labs: Recent Labs    10/17/23 1502 10/17/23 1638  HGB 10.9*  --   HCT 34.4*  --   PLT 231  --   CREATININE 0.62  --   TROPONINIHS 46* 60*    Estimated Creatinine Clearance: 33.1 mL/min (by C-G formula based on SCr of 0.62 mg/dL).   Medical History: Past Medical History:  Diagnosis Date   Cancer (HCC)    SKIN   COPD, mild (HCC)    Dyspnea    Edema    HLD (hyperlipidemia)    HTN (hypertension)    Hypothyroidism    Macular degeneration, bilateral    Varicosities    Assessment: 89 y/o female presenting with shortness of breath and heart palpitations. PMH significant for HTN, HLD, COPD, hypothyroidism. Found to be in new onset atrial fibrillation while in the ED. Pharmacy has been consulted to initiate heparin infusion. Per chart review, patient not on anticoagulation prior to admission.  Baseline labs: hgb 10.9, plt 231, INR pending  Goal of Therapy:  Heparin level 0.3-0.7 units/ml Monitor platelets by anticoagulation protocol: Yes   Plan:  Give 2150 units bolus x 1 Start heparin infusion at 600 units/hr Check anti-Xa level in 8 hours and daily while on heparin Continue to monitor H&H and platelets  Thank you for involving pharmacy in this patient's care.   Rockwell Alexandria, PharmD Clinical Pharmacist 10/17/2023 9:09 PM

## 2023-10-17 NOTE — ED Notes (Signed)
 Pt in CT.

## 2023-10-17 NOTE — ED Notes (Signed)
 Pt cleaned, new brief and sheetz applied.

## 2023-10-17 NOTE — ED Notes (Addendum)
 Mild redness to pt coccyx. Mepilex dressing applied

## 2023-10-17 NOTE — ED Notes (Signed)
 Pt brief changed at this time. Chux pad placed. Pt belongings placed in pt bag.   Pt belongings:  Orange shirt  Green pants Pair of socks Pair of tan shoes   Belongings placed at bedside per pt request

## 2023-10-17 NOTE — ED Provider Notes (Signed)
 [facility] Provider Note    Event Date/Time   First MD Initiated Contact with Patient 10/17/23 1510     (approximate)   History   Shortness of Breath   HPI  Angela Mason is a 89 y.o. female  per prior notes: cardiac Risk Factors include: advanced age (>42men, >48 women);hypertension;dyslipidemia "  Patient reports has been feeling short of breath with walking and getting up doing things for "quite a while" but today started feeling more short of breath causing her to call EMS  She denies any chest pain.  No nausea or vomiting.  She reports she gets short of breath easily and they noticed last couple days when putting her socks on and both of her legs seemed to be a little bit swollen  She denies any cough fever or wheezing.  Not have any chest pain.  Denies any history of CHF or heart failure and has no history of A-fib.  No history of easy bleeding      Physical Exam   Triage Vital Signs: ED Triage Vitals  Encounter Vitals Group     BP 10/17/23 1452 (!) 123/100     Systolic BP Percentile --      Diastolic BP Percentile --      Pulse Rate 10/17/23 1452 (!) 150     Resp 10/17/23 1452 17     Temp 10/17/23 1455 97.9 F (36.6 C)     Temp Source 10/17/23 1455 Oral     SpO2 10/17/23 1452 93 %     Weight 10/17/23 1445 95 lb (43.1 kg)     Height 10/17/23 1445 5' (1.524 m)     Head Circumference --      Peak Flow --      Pain Score 10/17/23 1444 0     Pain Loc --      Pain Education --      Exclude from Growth Chart --     Most recent vital signs: Vitals:   10/17/23 1910 10/17/23 1917  BP: 126/84   Pulse:  (!) 130  Resp:  (!) 33  Temp:    SpO2:  100%     General: Awake, no distress though she does appear just slightly tachypneic.  She is alert and well oriented CV:  Good peripheral perfusion.  Tachycardia, irregularity of rhythm heart rate approximately 150 Resp:  Normal effort.  Clear bilateral no wheezing.  Slight tachypnea.  No  noted Rales or abnormal lung sounds noted Abd:  No distention.  Soft and nontender Other:  Mild bilateral lower extremity edema without venous congestion   ED Results / Procedures / Treatments   Labs (all labs ordered are listed, but only abnormal results are displayed) Labs Reviewed  CBC - Abnormal; Notable for the following components:      Result Value   WBC 11.7 (*)    RBC 3.38 (*)    Hemoglobin 10.9 (*)    HCT 34.4 (*)    MCV 101.8 (*)    All other components within normal limits  COMPREHENSIVE METABOLIC PANEL - Abnormal; Notable for the following components:   Sodium 129 (*)    Chloride 95 (*)    Glucose, Bld 142 (*)    Calcium 7.9 (*)    Total Protein 6.4 (*)    Albumin 3.2 (*)    All other components within normal limits  BRAIN NATRIURETIC PEPTIDE - Abnormal; Notable for the following components:   B Natriuretic Peptide 667.2 (*)  All other components within normal limits  TROPONIN I (HIGH SENSITIVITY) - Abnormal; Notable for the following components:   Troponin I (High Sensitivity) 46 (*)    All other components within normal limits  TROPONIN I (HIGH SENSITIVITY) - Abnormal; Notable for the following components:   Troponin I (High Sensitivity) 60 (*)    All other components within normal limits  SARS CORONAVIRUS 2 BY RT PCR  RESPIRATORY PANEL BY PCR  TSH  HEPARIN LEVEL (UNFRACTIONATED)  CBC  BASIC METABOLIC PANEL  BASIC METABOLIC PANEL  MAGNESIUM  PHOSPHORUS  T4, FREE     EKG  EKG interpreted by me at 1510 heart rate 130 QRS 90 QTc 440 Atrial fibrillation.  Probable underlying left ventricular hypertrophy.  Mild nonspecific T wave abnormality.   RADIOLOGY  CT Angio Chest PE W and/or Wo Contrast Result Date: 10/17/2023 CLINICAL DATA:  Shortness of breath.  History of COPD.  Ex-smoker. EXAM: CT ANGIOGRAPHY CHEST WITH CONTRAST TECHNIQUE: Multidetector CT imaging of the chest was performed using the standard protocol during bolus administration of  intravenous contrast. Multiplanar CT image reconstructions and MIPs were obtained to evaluate the vascular anatomy. RADIATION DOSE REDUCTION: This exam was performed according to the departmental dose-optimization program which includes automated exposure control, adjustment of the mA and/or kV according to patient size and/or use of iterative reconstruction technique. CONTRAST:  60mL OMNIPAQUE IOHEXOL 350 MG/ML SOLN COMPARISON:  Plain film of earlier today.  No prior CT. FINDINGS: Cardiovascular: The quality of this exam for evaluation of pulmonary embolism is good. Although the bolus is well timed, limitations include minimal motion, EKG wire artifact and patient arm position, not raised above the head. No evidence of pulmonary embolism. Advanced aortic and branch vessel atherosclerosis. The aorta is not well opacified secondary to bolus timing. Moderate cardiomegaly, without pericardial effusion. Left main and 3 vessel coronary artery calcification. Mediastinum/Nodes: No mediastinal or hilar adenopathy. Lungs/Pleura: Small bilateral pleural effusions. Moderate centrilobular emphysema. Right middle lobe endobronchial compression/narrowing at the level of the proximal segmental bronchi including on 68/4. This results in right middle lobe collapse. No well-defined obstructive mass. Biapical pleuroparenchymal scarring. Left greater than right base dependent compressive atelectasis. Upper Abdomen: Reflux of contrast into the hepatic veins suggests elevated right heart pressures. Artifact degradation continuing into the upper abdomen. No gross acute finding. Musculoskeletal: Lower thoracic spondylosis with S-shaped thoracolumbar spine curvature. Review of the MIP images confirms the above findings. IMPRESSION: 1.  No evidence of pulmonary embolism. 2. Mild multifocal degradation as detailed above. 3. Small bilateral pleural effusions. Findings of elevated right heart pressures. Suspect a component of fluid overload. 4.  Right middle lobe endobronchial obstruction or compression at the level of the proximal subsegmental bronchi resulting in right middle lobe collapse. No well-defined obstructive mass. Consider further evaluation with bronchoscopy with attention to the right middle lobe bronchus. Alternatively, especially given patient age and comorbidities, CT follow-up at 6 months could be performed. 5. Aortic atherosclerosis (ICD10-I70.0), coronary artery atherosclerosis and emphysema (ICD10-J43.9). Electronically Signed   By: Jeronimo Greaves M.D.   On: 10/17/2023 18:40   DG Chest 2 View Result Date: 10/17/2023 CLINICAL DATA:  Shortness of breath EXAM: CHEST - 2 VIEW COMPARISON:  Chest radiograph dated 06/26/2022 FINDINGS: Normal lung volumes. No focal consolidations. Trace blunting of bilateral costophrenic angles. No pneumothorax. Similar mildly enlarged cardiomediastinal silhouette. No acute osseous abnormality. Unchanged compression deformity of T4. IMPRESSION: 1. Small bilateral pleural effusions. 2. Similar mild cardiomegaly. Electronically Signed   By: Agustin Cree  M.D.   On: 10/17/2023 15:23    Chest x-ray inter by me as sinus mild bilateral pleural effusion  PROCEDURES:  Critical Care performed: No  Procedures   MEDICATIONS ORDERED IN ED: Medications  amiodarone (NEXTERONE) 1.8 mg/mL load via infusion 150 mg (150 mg Intravenous Bolus from Bag 10/17/23 1832)    Followed by  amiodarone (NEXTERONE PREMIX) 360-4.14 MG/200ML-% (1.8 mg/mL) IV infusion (60 mg/hr Intravenous New Bag/Given 10/17/23 1832)    Followed by  amiodarone (NEXTERONE PREMIX) 360-4.14 MG/200ML-% (1.8 mg/mL) IV infusion (has no administration in time range)  heparin ADULT infusion 100 units/mL (25000 units/279mL) (600 Units/hr Intravenous New Bag/Given 10/17/23 1849)  sodium chloride tablet 1 g (has no administration in time range)  dextromethorphan-guaiFENesin (MUCINEX DM) 30-600 MG per 12 hr tablet 1 tablet (has no administration in time  range)  ondansetron (ZOFRAN) injection 4 mg (has no administration in time range)  acetaminophen (TYLENOL) tablet 650 mg (has no administration in time range)  hydrALAZINE (APRESOLINE) injection 5 mg (has no administration in time range)  albuterol (PROVENTIL) (2.5 MG/3ML) 0.083% nebulizer solution 2.5 mg (has no administration in time range)  feeding supplement (ENSURE ENLIVE / ENSURE PLUS) liquid 237 mL (has no administration in time range)  diltiazem (CARDIZEM) injection 10 mg (10 mg Intravenous Given 10/17/23 1534)  iohexol (OMNIPAQUE) 350 MG/ML injection 60 mL (60 mLs Intravenous Contrast Given 10/17/23 1726)  furosemide (LASIX) injection 20 mg (20 mg Intravenous Given 10/17/23 1834)  heparin bolus via infusion 2,150 Units (2,150 Units Intravenous Bolus from Bag 10/17/23 1849)     IMPRESSION / MDM / ASSESSMENT AND PLAN / ED COURSE  I reviewed the triage vital signs and the nursing notes.                              Differential diagnosis includes, but is not limited to, possible new onset A-fib with RVR, volume overload, ACS, primary cardiac cause, cryptogenic etc.  She appears slightly volume overloaded on exam reports exertional dyspnea for "some time" but more so today.  Mild oxygen requirement.  Blood pressures are normotensive to slightly hypotensive.  Does not carry history of CHF.  Will exclude pulmonary embolism given the associated dyspnea tachycardia  Patient's presentation is most consistent with acute presentation with potential threat to life or bodily function.   The patient is on the cardiac monitor to evaluate for evidence of arrhythmia and/or significant heart rate changes.  Discussed with patient understanding agreeable with plan for admission.  She does feel some stressors in that she reports her family cleaning daughter supposed to visit her from the [place] soon.  She is awake alert.  I consulted with her cardiologist Dr. Welton Flakes, given the patient's relatively slow to  little response with diltiazem he recommends discontinuation of diltiazem and switch to amiodarone load and infusion.  I have ordered this.  Also small dose of Lasix as guided by cardiology.  Further cardiology consult to follow at this point impression is likely volume overload mild CHF with new onset A-fib RVR.  Patient will clearly require hospitalization and further workup.  She has started on heparin at the recommendation of cardiology she does not have a history of anticoagulant use or bleeding diathesis  CRITICAL CARE Performed by: Sharyn Creamer   Total critical care time: 35 minutes  Critical care time was exclusive of separately billable procedures and treating other patients.  Critical care was necessary to treat or prevent  imminent or life-threatening deterioration.  Critical care was time spent personally by me on the following activities: development of treatment plan with patient and/or surrogate as well as nursing, discussions with consultants, evaluation of patient's response to treatment, examination of patient, obtaining history from patient or surrogate, ordering and performing treatments and interventions, ordering and review of laboratory studies, ordering and review of radiographic studies, pulse oximetry and re-evaluation of patient's condition.      FINAL CLINICAL IMPRESSION(S) / ED DIAGNOSES   Final diagnoses:  Atrial fibrillation, rapid (HCC)     Rx / DC Orders   ED Discharge Orders     None        Note:  This document was prepared using Dragon voice recognition software and may include unintentional dictation errors.   Sharyn Creamer, MD 10/17/23 [phone]

## 2023-10-17 NOTE — ED Notes (Signed)
MD made aware of increase in HR

## 2023-10-17 NOTE — ED Notes (Signed)
 Pt up to the bathroom

## 2023-10-17 NOTE — ED Notes (Addendum)
 Pt still jumping into the 140's 150's after admin of dilt. Pt remians A&Ox4. All other vitals WDL.

## 2023-10-17 NOTE — ED Triage Notes (Addendum)
 Pt in from [facility] for SOB. pt was placed on 4L due to 86% on RA. Pt currently on 3L [place] sating at 96%. HR  for EMS 150-170 and irregular. No hx of afib per pt. Exhibitory wheezes noted on assessment. Pt states she has episodes where she gets SOB at home. Pt has hx of COPD.  EMS gave 10mg  of cardizem & 200cc of fluids BS 130

## 2023-10-18 ENCOUNTER — Inpatient Hospital Stay
Admit: 2023-10-18 | Discharge: 2023-10-18 | Disposition: A | Discharge status: Home / Self Care | Attending: Internal Medicine | Admitting: Internal Medicine

## 2023-10-18 DIAGNOSIS — I4891 Unspecified atrial fibrillation: Secondary | ICD-10-CM

## 2023-10-18 DIAGNOSIS — I5031 Acute diastolic (congestive) heart failure: Secondary | ICD-10-CM | POA: Diagnosis not present

## 2023-10-18 DIAGNOSIS — I509 Heart failure, unspecified: Secondary | ICD-10-CM | POA: Diagnosis not present

## 2023-10-18 LAB — ECHOCARDIOGRAM COMPLETE
AR max vel: 1.29 cm2
AV Peak grad: 10.6 mmHg
Ao pk vel: 1.63 m/s
Height: 60 in
S' Lateral: 2.4 cm
Weight: 1520 [oz_av]

## 2023-10-18 LAB — BASIC METABOLIC PANEL
Anion gap: 10 (ref 5–15)
Anion gap: 8 (ref 5–15)
BUN: 19 mg/dL (ref 8–23)
BUN: 24 mg/dL — ABNORMAL HIGH (ref 8–23)
CO2: 26 mmol/L (ref 22–32)
CO2: 27 mmol/L (ref 22–32)
Calcium: 8.1 mg/dL — ABNORMAL LOW (ref 8.9–10.3)
Calcium: 8.1 mg/dL — ABNORMAL LOW (ref 8.9–10.3)
Chloride: 96 mmol/L — ABNORMAL LOW (ref 98–111)
Chloride: 98 mmol/L (ref 98–111)
Creatinine, Ser: 0.56 mg/dL (ref 0.44–1.00)
Creatinine, Ser: 0.66 mg/dL (ref 0.44–1.00)
GFR, Estimated: >60 mL/min (ref >60)
GFR, Estimated: >60 mL/min (ref >60)
Glucose, Bld: 147 mg/dL — ABNORMAL HIGH (ref 70–99)
Glucose, Bld: 172 mg/dL — ABNORMAL HIGH (ref 70–99)
Potassium: 3.8 mmol/L (ref 3.5–5.1)
Potassium: 4.8 mmol/L (ref 3.5–5.1)
Sodium: 132 mmol/L — ABNORMAL LOW (ref 135–145)
Sodium: 133 mmol/L — ABNORMAL LOW (ref 135–145)

## 2023-10-18 LAB — RESPIRATORY PANEL BY PCR

## 2023-10-18 LAB — HEPARIN LEVEL (UNFRACTIONATED)
Heparin Unfractionated: 0.34 [IU]/mL (ref 0.30–0.70)
Heparin Unfractionated: 0.33 [IU]/mL (ref 0.30–0.70)

## 2023-10-18 LAB — CBC
HCT: 33.9 % — ABNORMAL LOW (ref 36.0–46.0)
Hemoglobin: 11.1 g/dL — ABNORMAL LOW (ref 12.0–15.0)
MCH: 32.3 pg (ref 26.0–34.0)
MCHC: 32.7 g/dL (ref 30.0–36.0)
MCV: 98.5 fL (ref 80.0–100.0)
Platelets: 208 10*3/uL (ref 150–400)
RBC: 3.44 MIL/uL — ABNORMAL LOW (ref 3.87–5.11)
RDW: 14 % (ref 11.5–15.5)
WBC: 11.4 10*3/uL — ABNORMAL HIGH (ref 4.0–10.5)
nRBC: 0 % (ref 0.0–0.2)

## 2023-10-18 LAB — TROPONIN I (HIGH SENSITIVITY)
Troponin I (High Sensitivity): 36 ng/L — ABNORMAL HIGH (ref <18)
Troponin I (High Sensitivity): 42 ng/L — ABNORMAL HIGH (ref <18)

## 2023-10-18 MED ORDER — METOPROLOL TARTRATE 5 MG/5ML IV SOLN
5.0000 mg | Freq: Once | INTRAVENOUS | Status: AC
  Administered 2023-10-18: 5 mg via INTRAVENOUS
  Filled 2023-10-18: qty 5

## 2023-10-18 MED ORDER — DAPAGLIFLOZIN PROPANEDIOL 10 MG PO TABS
10.0000 mg | ORAL_TABLET | Freq: Every day | ORAL | Status: DC
  Administered 2023-10-18 – 2023-10-21 (×4): 10 mg via ORAL
  Filled 2023-10-18 (×5): qty 1

## 2023-10-18 MED ORDER — IPRATROPIUM BROMIDE 0.02 % IN SOLN: 0.5000 mg | RESPIRATORY_TRACT | Status: DC | PRN

## 2023-10-18 MED ORDER — LOSARTAN POTASSIUM 25 MG PO TABS
25.0000 mg | ORAL_TABLET | Freq: Every day | ORAL | Status: DC
  Administered 2023-10-18 – 2023-10-19 (×2): 25 mg via ORAL
  Filled 2023-10-18 (×2): qty 1

## 2023-10-18 MED ORDER — METOPROLOL TARTRATE 25 MG PO TABS
25.0000 mg | ORAL_TABLET | Freq: Two times a day (BID) | ORAL | Status: DC
  Administered 2023-10-18 – 2023-10-19 (×3): 25 mg via ORAL
  Filled 2023-10-18 (×3): qty 1

## 2023-10-18 NOTE — ED Notes (Signed)
 This RN called lab to tube [place] med.

## 2023-10-18 NOTE — ED Notes (Signed)
 Pt resting, eyes closed, respirations even and non-labored.

## 2023-10-18 NOTE — Progress Notes (Signed)
 PHARMACY - ANTICOAGULATION CONSULT NOTE  Pharmacy Consult for heparin Indication: atrial fibrillation  Allergies  Allergen Reactions   Gentamicin    Levofloxacin Diarrhea    Patient Measurements: Height: 5' (152.4 cm) Weight: 43.1 kg (95 lb) IBW/kg (Calculated) : 45.5 Heparin Dosing Weight: 43.1 kg  Vital Signs: Temp: 97.5 F (36.4 C) (03/23 0319) BP: 119/96 (03/23 1000) Pulse Rate: 112 (03/23 1000)  Labs: Recent Labs    10/17/23 1502 10/17/23 1638 10/17/23 1930 10/17/23 2317 10/17/23 2341 10/18/23 0250 10/18/23 1206  HGB 10.9*  --   --   --   --  11.1*  --   HCT 34.4*  --   --   --   --  33.9*  --   PLT 231  --   --   --   --  208  --   LABPROT  --   --   --  14.8  --   --   --   INR  --   --   --  1.1  --   --   --   HEPARINUNFRC  --   --   --   --   --  0.34 0.33  CREATININE 0.62  --  0.57  --   --  0.56 0.66  TROPONINIHS 46*   < > 54*  --  42* 36*  --    < > = values in this interval not displayed.    Estimated Creatinine Clearance: 33.1 mL/min (by C-G formula based on SCr of 0.66 mg/dL).   Medical History: Past Medical History:  Diagnosis Date   Cancer (HCC)    SKIN   COPD, mild (HCC)    Dyspnea    Edema    HLD (hyperlipidemia)    HTN (hypertension)    Hypothyroidism    Macular degeneration, bilateral    Varicosities    Assessment: 89 y/o female presenting with shortness of breath and heart palpitations. PMH significant for HTN, HLD, COPD, hypothyroidism. Found to be in new onset atrial fibrillation while in the ED. Pharmacy has been consulted to initiate heparin infusion. Per chart review, patient not on anticoagulation prior to admission.  Baseline labs: hgb 10.9, plt 231, INR pending  Goal of Therapy:  Heparin level 0.3-0.7 units/ml Monitor platelets by anticoagulation protocol: Yes  3/23 0250 HL 0.34, therapeutic x 1 3/23 1206 HL 0.33, therapeutic x2   Plan:  Continue heparin infusion at 600 units/hr check HL with am labs CBC daily  while on heparin infusion.  Thank you for involving pharmacy in this patient's care.   Bari Mantis PharmD Clinical Pharmacist 10/18/2023

## 2023-10-18 NOTE — ED Notes (Addendum)
 This RN attempted to call Pt family member/ family friend x2. No voicemail was available. Pt was informed that friend will call back and she will be able to speak once she does. Cup of water provided. No further needs expressed. CB within reach. NAD.

## 2023-10-18 NOTE — Progress Notes (Signed)
 PHARMACY - ANTICOAGULATION CONSULT NOTE  Pharmacy Consult for heparin Indication: atrial fibrillation  Allergies  Allergen Reactions   Gentamicin    Levofloxacin Diarrhea    Patient Measurements: Height: 5' (152.4 cm) Weight: 43.1 kg (95 lb) IBW/kg (Calculated) : 45.5 Heparin Dosing Weight: 43.1 kg  Vital Signs: Temp: 97.5 F (36.4 C) (03/23 0319) Temp Source: Oral (03/22 2308) BP: 113/81 (03/23 0300) Pulse Rate: 124 (03/23 0300)  Labs: Recent Labs    10/17/23 1502 10/17/23 1638 10/17/23 1930 10/17/23 2317 10/17/23 2341 10/18/23 0250  HGB 10.9*  --   --   --   --  11.1*  HCT 34.4*  --   --   --   --  33.9*  PLT 231  --   --   --   --  208  LABPROT  --   --   --  14.8  --   --   INR  --   --   --  1.1  --   --   HEPARINUNFRC  --   --   --   --   --  0.34  CREATININE 0.62  --  0.57  --   --  0.56  TROPONINIHS 46*   < > 54*  --  42* 36*   < > = values in this interval not displayed.    Estimated Creatinine Clearance: 33.1 mL/min (by C-G formula based on SCr of 0.56 mg/dL).   Medical History: Past Medical History:  Diagnosis Date   Cancer (HCC)    SKIN   COPD, mild (HCC)    Dyspnea    Edema    HLD (hyperlipidemia)    HTN (hypertension)    Hypothyroidism    Macular degeneration, bilateral    Varicosities    Assessment: 89 y/o female presenting with shortness of breath and heart palpitations. PMH significant for HTN, HLD, COPD, hypothyroidism. Found to be in new onset atrial fibrillation while in the ED. Pharmacy has been consulted to initiate heparin infusion. Per chart review, patient not on anticoagulation prior to admission.  Baseline labs: hgb 10.9, plt 231, INR pending  Goal of Therapy:  Heparin level 0.3-0.7 units/ml Monitor platelets by anticoagulation protocol: Yes  3/23 0250 HL 0.34, therapeutic x 1   Plan:  Continue heparin infusion at 600 units/hr Recheck HL in 8 hrs to confirm then daily CBC daily while on heparin infusion.  Thank  you for involving pharmacy in this patient's care.   Otelia Sergeant, PharmD, [facility] 10/18/2023 4:15 AM

## 2023-10-18 NOTE — ED Notes (Signed)
 Pt set up for breakfast. CB within reach. Bed locked and in lowest position.

## 2023-10-18 NOTE — Consult Note (Addendum)
 Angela Mason is a 89 y.o. female  [id number]  Primary Cardiologist: [facility] cardiology Reason for Consultation: New onset atrial fibrillation  HPI: This is a 89 year old white female with a past medical history of hyperlipidemia hypertension and COPD who presented to the hospital with shortness of breath, dizziness and swelling of the legs.  She denied any chest pain.  She was having the symptoms for the past few weeks and got worse yesterday and thus arrived to the emergency room.   Review of Systems: Does have some orthopnea PND and leg swelling   Past Medical History:  Diagnosis Date   Cancer (HCC)    SKIN   COPD, mild (HCC)    Dyspnea    Edema    HLD (hyperlipidemia)    HTN (hypertension)    Hypothyroidism    Macular degeneration, bilateral    Varicosities     (Not in a hospital admission)     ARIPiprazole  2 mg Oral Q breakfast   aspirin EC  81 mg Oral Daily   doxycycline  100 mg Oral Q12H   erythromycin  1 Application Both Eyes QHS   feeding supplement  237 mL Oral BID BM   furosemide  20 mg Intravenous Q12H   ipratropium  0.5 mg Nebulization Q4H   methylPREDNISolone (SOLU-MEDROL) injection  40 mg Intravenous Q12H   metoprolol tartrate  25 mg Oral BID   multivitamin  1 tablet Oral BID   nystatin  1 Application Topical BID   omega-3 acid ethyl esters  1 g Oral Daily   polyvinyl alcohol  1 drop Both Eyes QHS   pravastatin  20 mg Oral q1800    Infusions:  amiodarone 30 mg/hr (10/18/23 1011)   heparin 600 Units/hr (10/18/23 0259)    Allergies  Allergen Reactions   Gentamicin    Levofloxacin Diarrhea    Social History   Socioeconomic History   Marital status: Widowed    Spouse name: Not on file   Number of children: Not on file   Years of education: Not on file   Highest education level: Not on file  Occupational History   Not on file  Tobacco Use   Smoking status: Former    Current packs/day: 0.00    Types: Cigarettes    Quit date: 09/05/2005     Years since quitting: 18.1   Smokeless tobacco: Never  Vaping Use   Vaping status: Never Used  Substance and Sexual Activity   Alcohol use: Not Currently   Drug use: No   Sexual activity: Not Currently    Birth control/protection: Post-menopausal  Other Topics Concern   Not on file  Social History Narrative   Not on file   Social Drivers of Health   Financial Resource Strain: Not on file  Food Insecurity: Not on file  Transportation Needs: Not on file  Physical Activity: Not on file  Stress: Not on file  Social Connections: Not on file  Intimate Partner Violence: Not on file    Family History  Problem Relation Age of Onset   Diverticulitis Mother    Macular degeneration Mother    COPD Father    Heart failure Father    Epilepsy Sister    Parkinson's disease Brother    Cancer Neg Hx    Diabetes Neg Hx    Breast cancer Neg Hx     PHYSICAL EXAM: Vitals:   10/18/23 0913 10/18/23 1000  BP: (!) 145/101 (!) 119/96  Pulse: (!) 137 Marland Kitchen)  112  Resp:  (!) 29  Temp:    SpO2:  100%    No intake or output data in the 24 hours ending 10/18/23 1153  General:  Well appearing. No respiratory difficulty HEENT: normal Neck: supple. no JVD. Carotids 2+ bilat; no bruits. No lymphadenopathy or thryomegaly appreciated. Cor: PMI nondisplaced. Regular rate & rhythm. No rubs, gallops or murmurs. Lungs: clear Abdomen: soft, nontender, nondistended. No hepatosplenomegaly. No bruits or masses. Good bowel sounds. Extremities: no cyanosis, clubbing, rash, edema Neuro: alert & oriented x 3, cranial nerves grossly intact. moves all 4 extremities w/o difficulty. Affect pleasant.  ECG: Atrial fibrillation with rapid ventricular response rate approximately 130/min with LVH and nonspecific ST-T changes  Results for orders placed or performed during the hospital encounter of 10/17/23 (from the past 24 hours)  Brain natriuretic peptide     Status: Abnormal   Collection Time: 10/17/23  3:00 PM   Result Value Ref Range   B Natriuretic Peptide 667.2 (H) 0.0 - 100.0 pg/mL  CBC     Status: Abnormal   Collection Time: 10/17/23  3:02 PM  Result Value Ref Range   WBC 11.7 (H) 4.0 - 10.5 K/uL   RBC 3.38 (L) 3.87 - 5.11 MIL/uL   Hemoglobin 10.9 (L) 12.0 - 15.0 g/dL   HCT 09.8 (L) 11.9 - 14.7 %   MCV 101.8 (H) 80.0 - 100.0 fL   MCH 32.2 26.0 - 34.0 pg   MCHC 31.7 30.0 - 36.0 g/dL   RDW 82.9 56.2 - 13.0 %   Platelets 231 150 - 400 K/uL   nRBC 0.0 0.0 - 0.2 %  Comprehensive metabolic panel     Status: Abnormal   Collection Time: 10/17/23  3:02 PM  Result Value Ref Range   Sodium 129 (L) 135 - 145 mmol/L   Potassium 4.2 3.5 - 5.1 mmol/L   Chloride 95 (L) 98 - 111 mmol/L   CO2 26 22 - 32 mmol/L   Glucose, Bld 142 (H) 70 - 99 mg/dL   BUN 21 8 - 23 mg/dL   Creatinine, Ser 8.65 0.44 - 1.00 mg/dL   Calcium 7.9 (L) 8.9 - 10.3 mg/dL   Total Protein 6.4 (L) 6.5 - 8.1 g/dL   Albumin 3.2 (L) 3.5 - 5.0 g/dL   AST 30 15 - 41 U/L   ALT 39 0 - 44 U/L   Alkaline Phosphatase 70 38 - 126 U/L   Total Bilirubin 0.9 0.0 - 1.2 mg/dL   GFR, Estimated >78 >46 mL/min   Anion gap 8 5 - 15  Troponin I (High Sensitivity)     Status: Abnormal   Collection Time: 10/17/23  3:02 PM  Result Value Ref Range   Troponin I (High Sensitivity) 46 (H) <18 ng/L  TSH     Status: None   Collection Time: 10/17/23  3:02 PM  Result Value Ref Range   TSH 0.968 0.350 - 4.500 uIU/mL  Troponin I (High Sensitivity)     Status: Abnormal   Collection Time: 10/17/23  4:38 PM  Result Value Ref Range   Troponin I (High Sensitivity) 60 (H) <18 ng/L  Phosphorus     Status: None   Collection Time: 10/17/23  4:38 PM  Result Value Ref Range   Phosphorus 3.2 2.5 - 4.6 mg/dL  T4, free     Status: None   Collection Time: 10/17/23  4:38 PM  Result Value Ref Range   Free T4 0.88 0.61 - 1.12 ng/dL  Magnesium  Status: None   Collection Time: 10/17/23  4:38 PM  Result Value Ref Range   Magnesium 2.3 1.7 - 2.4 mg/dL  Basic  metabolic panel     Status: Abnormal   Collection Time: 10/17/23  7:30 PM  Result Value Ref Range   Sodium 131 (L) 135 - 145 mmol/L   Potassium 3.9 3.5 - 5.1 mmol/L   Chloride 94 (L) 98 - 111 mmol/L   CO2 27 22 - 32 mmol/L   Glucose, Bld 121 (H) 70 - 99 mg/dL   BUN 19 8 - 23 mg/dL   Creatinine, Ser 4.09 0.44 - 1.00 mg/dL   Calcium 8.1 (L) 8.9 - 10.3 mg/dL   GFR, Estimated >81 >19 mL/min   Anion gap 10 5 - 15  SARS Coronavirus 2 by RT PCR (hospital order, performed in [facility] hospital lab) *cepheid single result test* Nasopharyngeal Swab     Status: None   Collection Time: 10/17/23  7:30 PM   Specimen: Nasopharyngeal Swab; Nasal Swab  Result Value Ref Range   SARS Coronavirus 2 by RT PCR NEGATIVE NEGATIVE  Respiratory (~20 pathogens) panel by PCR     Status: None   Collection Time: 10/17/23  7:30 PM   Specimen: Nasopharyngeal Swab; Respiratory  Result Value Ref Range   Adenovirus NOT DETECTED NOT DETECTED   Coronavirus 229E NOT DETECTED NOT DETECTED   Coronavirus HKU1 NOT DETECTED NOT DETECTED   Coronavirus NL63 NOT DETECTED NOT DETECTED   Coronavirus OC43 NOT DETECTED NOT DETECTED   Metapneumovirus NOT DETECTED NOT DETECTED   Rhinovirus / Enterovirus NOT DETECTED NOT DETECTED   Influenza A NOT DETECTED NOT DETECTED   Influenza B NOT DETECTED NOT DETECTED   Parainfluenza Virus 1 NOT DETECTED NOT DETECTED   Parainfluenza Virus 2 NOT DETECTED NOT DETECTED   Parainfluenza Virus 3 NOT DETECTED NOT DETECTED   Parainfluenza Virus 4 NOT DETECTED NOT DETECTED   Respiratory Syncytial Virus NOT DETECTED NOT DETECTED   Bordetella pertussis NOT DETECTED NOT DETECTED   Bordetella Parapertussis NOT DETECTED NOT DETECTED   Chlamydophila pneumoniae NOT DETECTED NOT DETECTED   Mycoplasma pneumoniae NOT DETECTED NOT DETECTED  Troponin I (High Sensitivity)     Status: Abnormal   Collection Time: 10/17/23  7:30 PM  Result Value Ref Range   Troponin I (High Sensitivity) 54 (H) <18 ng/L   Protime-INR     Status: None   Collection Time: 10/17/23 11:17 PM  Result Value Ref Range   Prothrombin Time 14.8 11.4 - 15.2 seconds   INR 1.1 0.8 - 1.2  Troponin I (High Sensitivity)     Status: Abnormal   Collection Time: 10/17/23 11:41 PM  Result Value Ref Range   Troponin I (High Sensitivity) 42 (H) <18 ng/L  Heparin level (unfractionated)     Status: None   Collection Time: 10/18/23  2:50 AM  Result Value Ref Range   Heparin Unfractionated 0.34 0.30 - 0.70 IU/mL  CBC     Status: Abnormal   Collection Time: 10/18/23  2:50 AM  Result Value Ref Range   WBC 11.4 (H) 4.0 - 10.5 K/uL   RBC 3.44 (L) 3.87 - 5.11 MIL/uL   Hemoglobin 11.1 (L) 12.0 - 15.0 g/dL   HCT 14.7 (L) 82.9 - 56.2 %   MCV 98.5 80.0 - 100.0 fL   MCH 32.3 26.0 - 34.0 pg   MCHC 32.7 30.0 - 36.0 g/dL   RDW 13.0 86.5 - 78.4 %   Platelets 208 150 -  400 K/uL   nRBC 0.0 0.0 - 0.2 %  Basic metabolic panel     Status: Abnormal   Collection Time: 10/18/23  2:50 AM  Result Value Ref Range   Sodium 132 (L) 135 - 145 mmol/L   Potassium 3.8 3.5 - 5.1 mmol/L   Chloride 96 (L) 98 - 111 mmol/L   CO2 26 22 - 32 mmol/L   Glucose, Bld 147 (H) 70 - 99 mg/dL   BUN 19 8 - 23 mg/dL   Creatinine, Ser 1.61 0.44 - 1.00 mg/dL   Calcium 8.1 (L) 8.9 - 10.3 mg/dL   GFR, Estimated >09 >60 mL/min   Anion gap 10 5 - 15  Troponin I (High Sensitivity)     Status: Abnormal   Collection Time: 10/18/23  2:50 AM  Result Value Ref Range   Troponin I (High Sensitivity) 36 (H) <18 ng/L   CT Angio Chest PE W and/or Wo Contrast Result Date: 10/17/2023 CLINICAL DATA:  Shortness of breath.  History of COPD.  Ex-smoker. EXAM: CT ANGIOGRAPHY CHEST WITH CONTRAST TECHNIQUE: Multidetector CT imaging of the chest was performed using the standard protocol during bolus administration of intravenous contrast. Multiplanar CT image reconstructions and MIPs were obtained to evaluate the vascular anatomy. RADIATION DOSE REDUCTION: This exam was performed  according to the departmental dose-optimization program which includes automated exposure control, adjustment of the mA and/or kV according to patient size and/or use of iterative reconstruction technique. CONTRAST:  60mL OMNIPAQUE IOHEXOL 350 MG/ML SOLN COMPARISON:  Plain film of earlier today.  No prior CT. FINDINGS: Cardiovascular: The quality of this exam for evaluation of pulmonary embolism is good. Although the bolus is well timed, limitations include minimal motion, EKG wire artifact and patient arm position, not raised above the head. No evidence of pulmonary embolism. Advanced aortic and branch vessel atherosclerosis. The aorta is not well opacified secondary to bolus timing. Moderate cardiomegaly, without pericardial effusion. Left main and 3 vessel coronary artery calcification. Mediastinum/Nodes: No mediastinal or hilar adenopathy. Lungs/Pleura: Small bilateral pleural effusions. Moderate centrilobular emphysema. Right middle lobe endobronchial compression/narrowing at the level of the proximal segmental bronchi including on 68/4. This results in right middle lobe collapse. No well-defined obstructive mass. Biapical pleuroparenchymal scarring. Left greater than right base dependent compressive atelectasis. Upper Abdomen: Reflux of contrast into the hepatic veins suggests elevated right heart pressures. Artifact degradation continuing into the upper abdomen. No gross acute finding. Musculoskeletal: Lower thoracic spondylosis with S-shaped thoracolumbar spine curvature. Review of the MIP images confirms the above findings. IMPRESSION: 1.  No evidence of pulmonary embolism. 2. Mild multifocal degradation as detailed above. 3. Small bilateral pleural effusions. Findings of elevated right heart pressures. Suspect a component of fluid overload. 4. Right middle lobe endobronchial obstruction or compression at the level of the proximal subsegmental bronchi resulting in right middle lobe collapse. No well-defined  obstructive mass. Consider further evaluation with bronchoscopy with attention to the right middle lobe bronchus. Alternatively, especially given patient age and comorbidities, CT follow-up at 6 months could be performed. 5. Aortic atherosclerosis (ICD10-I70.0), coronary artery atherosclerosis and emphysema (ICD10-J43.9). Electronically Signed   By: Jeronimo Greaves M.D.   On: 10/17/2023 18:40   DG Chest 2 View Result Date: 10/17/2023 CLINICAL DATA:  Shortness of breath EXAM: CHEST - 2 VIEW COMPARISON:  Chest radiograph dated 06/26/2022 FINDINGS: Normal lung volumes. No focal consolidations. Trace blunting of bilateral costophrenic angles. No pneumothorax. Similar mildly enlarged cardiomediastinal silhouette. No acute osseous abnormality. Unchanged compression deformity of T4.  IMPRESSION: 1. Small bilateral pleural effusions. 2. Similar mild cardiomegaly. Electronically Signed   By: Agustin Cree M.D.   On: 10/17/2023 15:23     ASSESSMENT AND PLAN:  #1 new onset atrial fibrillation with rapid ventricular response rate.  Advise switching IV heparin to Eliquis 2.5 mg p.o. twice daily for stroke prevention.  Patient did not respond yesterday on Cardizem drip with ventricular rate remaining in 140-150 range.  Thus was switched to amiodarone drip and this morning ventricular rate is between 100 and 105, remaining in A-fib.  Will decide tomorrow whether to opt for rate control or consider TEE and DC cardioversion. #2 congestive heart failure most likely HFrEF but echocardiogram is pending.  Continue IV Lasix and will add [place]. #3 right middle lobe collapse with possible endobronchial obstruction without any well-defined obstructive mass.  Thus may have to opt for rate control for A-fib.  Thank you very much for referral will follow the patient closely with you  Adrian Blackwater

## 2023-10-18 NOTE — ED Notes (Signed)
 Pt brief checked at this time per pt request. Pt clean and dry

## 2023-10-18 NOTE — Progress Notes (Signed)
 PROGRESS NOTE    Angela Mason   WUJ:[id number] DOB: [birth year]  DOA: 10/17/2023 Date of Service: 10/18/23 which is hospital day 1  PCP: [name], MD    Hospital course / significant events:   HPI: Angela Mason is a 89 y.o. female with medical history significant of HTN, HLD, COPD, hypothyroidism, cystocele, who presents with SOB x several days, worsening. Assoc w/ palpiatations, heart racing, bl le edema.   03/22: admitted to hospitalist for CHF, Afib, Lung RML obstrcution/collapse noted on CTA chest, PE r/o, also w/ acute hypoxic resp fail. Requiring O2, amiodarone infusion, heparin infusion. Cardiology consulted.  03/23: no output documented but pt reports strong UOP. Breathign is about the same. Continuing diuresis. Await echo report. Added beta blocker, rate improved. Continuing on amiodarone infusion for now. Further recs per cardiology / pending Echo.      Consultants:  Cardiology   Procedures/Surgeries: none      ASSESSMENT & PLAN:   Acute CHF  New dx - uncertain systolic/diastolic/mix  Diuresis - lasix IV  Echo pending read  Daily weight, I&O, fluid restriction diet  Cardiology consult    Myocardial injury: trop  46 --> 60 peak --> downtrend Likely demand ischemia. Echo pending read   New onset atrial fibrillation with RVR CHADS2 score is 5, will need chronic anticoagulants.   TSH and T4 normal. IV heparin drip Amiodarone drip Metroprolol Echo pending read  Cardiology consult    COPD exacerbation  wheezing on auscultation, emphysema on CTA chest  Bronchodilators Doxycycline 100 mg twice daily Solu-Medrol 40 mg twice daily  As needed Mucinex   Acute respiratory failure with hypoxia  combination of acute CHF and COPD exacerbation. Trest underlying cause(s) as above Bronchodilators and as needed Mucinex Nasal cannula oxygen to maintain oxygen saturation above 93%  Hyponatremia: Sodium 129. Likely d/t fluid overload CHF Fluid  restriction Every 6 hour BMP   HLD (hyperlipidemia) CAD, Aortic Atherosclerosis Pravastatin (patient is on lovastatin at home), consider escalation given CAD/atherosclerosis noted on CT   Protein-calorie malnutrition, severe: Body weight 43.1 kg, BMI 18.55 Ensure Dietician consult   Right middle lobe collapse:  this is incidental finding by CTA. CTA showed right middle lobe endobronchial obstruction or compression at the level of the proximal subsegmental bronchi resulting in right middle lobe collapse. No well-defined obstructive mass.  may need to consult pulmonology for possible bronchoscopy or alternatively follow-up CT scan in 3-6 months. Personally reviewed imaging        underweight based on BMI: Body mass index is 18.55 kg/m.  Underweight - under 18  overweight - 25 to 29 obese - 30 or more Class 1 obesity: BMI of 30.0 to 34 Class 2 obesity: BMI of 35.0 to 39 Class 3 obesity: BMI of 40.0 to 49 Super Morbid Obesity: BMI 50-59 Super-super Morbid Obesity: BMI 60+ Significantly low or high BMI is associated with higher medical risk.  Weight management advised as adjunct to other disease management and risk reduction treatments    DVT prophylaxis: heparin IV fluids: no continuous IV fluids  Nutrition: regular diet w/ fluid restriction Central lines / other devices: none  Code Status: FULL CODE ACP documentation reviewed: none on file in [facility]  TOC needs: TBD Medical barriers to dispo: CHF, COPD - on IV drips at this time, possibly will need further cardial/pulm workup. Expected medical readiness for discharge few more days.              Subjective / Brief ROS:  Patient reports  SOB is about the same but only when she's moving around Denies CP/SOB at rest Pain controlled.  Denies new weakness.  Tolerating diet Reports no concerns w/ urination/defecation.   Family Communication: Pt asks I call her friend Angela Mason this afternoon, asked I not call her  daughter as daughter lives in [place]     Objective Findings:  Vitals:   10/18/23 0745 10/18/23 0912 10/18/23 0913 10/18/23 1000  BP:   (!) 145/101 (!) 119/96  Pulse:   (!) 137 (!) 112  Resp:  (!) 34  (!) 29  Temp:      TempSrc:      SpO2: 100%   100%  Weight:      Height:       No intake or output data in the 24 hours ending 10/18/23 1034 Filed Weights   10/17/23 1445  Weight: 43.1 kg    Examination:  Physical Exam Constitutional:      General: She is not in acute distress. Cardiovascular:     Rate and Rhythm: Tachycardia present. Rhythm irregular.  Pulmonary:     Effort: Pulmonary effort is normal.     Breath sounds: Decreased breath sounds, wheezing and rales present.  Musculoskeletal:     Right lower leg: Edema present.     Left lower leg: Edema present.  Neurological:     General: No focal deficit present.     Mental Status: She is alert and oriented to person, place, and time.  Psychiatric:        Mood and Affect: Mood normal.        Behavior: Behavior normal.          Scheduled Medications:   ARIPiprazole  2 mg Oral Q breakfast   aspirin EC  81 mg Oral Daily   doxycycline  100 mg Oral Q12H   erythromycin  1 Application Both Eyes QHS   feeding supplement  237 mL Oral BID BM   furosemide  20 mg Intravenous Q12H   ipratropium  0.5 mg Nebulization Q4H   methylPREDNISolone (SOLU-MEDROL) injection  40 mg Intravenous Q12H   metoprolol tartrate  25 mg Oral BID   multivitamin  1 tablet Oral BID   nystatin  1 Application Topical BID   omega-3 acid ethyl esters  1 g Oral Daily   polyvinyl alcohol  1 drop Both Eyes QHS   pravastatin  20 mg Oral q1800    Continuous Infusions:  amiodarone 30 mg/hr (10/18/23 1011)   heparin 600 Units/hr (10/18/23 0259)    PRN Medications:  acetaminophen, albuterol, alum & mag hydroxide-simeth, dextromethorphan-guaiFENesin, hydrALAZINE, magnesium hydroxide, ondansetron (ZOFRAN) IV  Antimicrobials from admission:   Anti-infectives (From admission, onward)    Start     Dose/Rate Route Frequency Ordered Stop   10/17/23 2215  doxycycline (VIBRA-TABS) tablet 100 mg        100 mg Oral Every 12 hours 10/17/23 2212             Data Reviewed:  I have personally reviewed the following...  CBC: Recent Labs  Lab 10/17/23 1502 10/18/23 0250  WBC 11.7* 11.4*  HGB 10.9* 11.1*  HCT 34.4* 33.9*  MCV 101.8* 98.5  PLT 231 208   Basic Metabolic Panel: Recent Labs  Lab 10/17/23 1502 10/17/23 1638 10/17/23 1930 10/18/23 0250  NA 129*  --  131* 132*  K 4.2  --  3.9 3.8  CL 95*  --  94* 96*  CO2 26  --  27 26  GLUCOSE  142*  --  121* 147*  BUN 21  --  19 19  CREATININE 0.62  --  0.57 0.56  CALCIUM 7.9*  --  8.1* 8.1*  MG  --  2.3  --   --   PHOS  --  3.2  --   --    GFR: Estimated Creatinine Clearance: 33.1 mL/min (by C-G formula based on SCr of 0.56 mg/dL). Liver Function Tests: Recent Labs  Lab 10/17/23 1502  AST 30  ALT 39  ALKPHOS 70  BILITOT 0.9  PROT 6.4*  ALBUMIN 3.2*   No results for input(s): "LIPASE", "AMYLASE" in the last 168 hours. No results for input(s): "AMMONIA" in the last 168 hours. Coagulation Profile: Recent Labs  Lab 10/17/23 2317  INR 1.1   Cardiac Enzymes: No results for input(s): "CKTOTAL", "CKMB", "CKMBINDEX", "TROPONINI" in the last 168 hours. BNP (last 3 results) No results for input(s): "PROBNP" in the last 8760 hours. HbA1C: No results for input(s): "HGBA1C" in the last 72 hours. CBG: No results for input(s): "GLUCAP" in the last 168 hours. Lipid Profile: No results for input(s): "CHOL", "HDL", "LDLCALC", "TRIG", "CHOLHDL", "LDLDIRECT" in the last 72 hours. Thyroid Function Tests: Recent Labs    10/17/23 1502 10/17/23 1638  TSH 0.968  --   FREET4  --  0.88   Anemia Panel: No results for input(s): "VITAMINB12", "FOLATE", "FERRITIN", "TIBC", "IRON", "RETICCTPCT" in the last 72 hours. Most Recent Urinalysis On File:     Component Value  Date/Time   COLORURINE YELLOW (A) 01/28/2022 1254   APPEARANCEUR Hazy (A) 02/25/2023 1316   LABSPEC 1.011 01/28/2022 1254   PHURINE 5.0 01/28/2022 1254   GLUCOSEU Negative 02/25/2023 1316   HGBUR MODERATE (A) 01/28/2022 1254   BILIRUBINUR Negative 02/25/2023 1316   KETONESUR NEGATIVE 01/28/2022 1254   PROTEINUR Negative 02/25/2023 1316   PROTEINUR NEGATIVE 01/28/2022 1254   UROBILINOGEN 0.2 09/19/2022 1544   NITRITE Negative 02/25/2023 1316   NITRITE POSITIVE (A) 01/28/2022 1254   LEUKOCYTESUR 2+ (A) 02/25/2023 1316   LEUKOCYTESUR LARGE (A) 01/28/2022 1254   Sepsis Labs: @LABRCNTIP (procalcitonin:4,lacticidven:4) Microbiology: Recent Results (from the past 240 hours)  SARS Coronavirus 2 by RT PCR (hospital order, performed in [facility] hospital lab) *cepheid single result test* Nasopharyngeal Swab     Status: None   Collection Time: 10/17/23  7:30 PM   Specimen: Nasopharyngeal Swab; Nasal Swab  Result Value Ref Range Status   SARS Coronavirus 2 by RT PCR NEGATIVE NEGATIVE Final    Comment: (NOTE) SARS-CoV-2 target nucleic acids are NOT DETECTED.  The SARS-CoV-2 RNA is generally detectable in upper and lower respiratory specimens during the acute phase of infection. The lowest concentration of SARS-CoV-2 viral copies this assay can detect is 250 copies / mL. A negative result does not preclude SARS-CoV-2 infection and should not be used as the sole basis for treatment or other patient management decisions.  A negative result may occur with improper specimen collection / handling, submission of specimen other than nasopharyngeal swab, presence of viral mutation(s) within the areas targeted by this assay, and inadequate number of viral copies (<250 copies / mL). A negative result must be combined with clinical observations, patient history, and epidemiological information.  Fact Sheet for Patients:   RoadLapTop.co.za  Fact Sheet for Healthcare  Providers: http://kim-miller.com/  This test is not yet approved or  cleared by the [place] FDA and has been authorized for detection and/or diagnosis of SARS-CoV-2 by FDA under an Emergency Use Authorization (EUA).  This EUA will remain in effect (meaning this test can be used) for the duration of the COVID-19 declaration under Section 564(b)(1) of the Act, 21 U.S.C. section 360bbb-3(b)(1), unless the authorization is terminated or revoked sooner.  Performed at [facility], [address] [place], [place] [address]   Respiratory (~20 pathogens) panel by PCR     Status: None   Collection Time: 10/17/23  7:30 PM   Specimen: Nasopharyngeal Swab; Respiratory  Result Value Ref Range Status   Adenovirus NOT DETECTED NOT DETECTED Final   Coronavirus 229E NOT DETECTED NOT DETECTED Final    Comment: (NOTE) The Coronavirus on the Respiratory Panel, DOES NOT test for the novel  Coronavirus (2019 nCoV)    Coronavirus HKU1 NOT DETECTED NOT DETECTED Final   Coronavirus NL63 NOT DETECTED NOT DETECTED Final   Coronavirus OC43 NOT DETECTED NOT DETECTED Final   Metapneumovirus NOT DETECTED NOT DETECTED Final   Rhinovirus / Enterovirus NOT DETECTED NOT DETECTED Final   Influenza A NOT DETECTED NOT DETECTED Final   Influenza B NOT DETECTED NOT DETECTED Final   Parainfluenza Virus 1 NOT DETECTED NOT DETECTED Final   Parainfluenza Virus 2 NOT DETECTED NOT DETECTED Final   Parainfluenza Virus 3 NOT DETECTED NOT DETECTED Final   Parainfluenza Virus 4 NOT DETECTED NOT DETECTED Final   Respiratory Syncytial Virus NOT DETECTED NOT DETECTED Final   Bordetella pertussis NOT DETECTED NOT DETECTED Final   Bordetella Parapertussis NOT DETECTED NOT DETECTED Final   Chlamydophila pneumoniae NOT DETECTED NOT DETECTED Final   Mycoplasma pneumoniae NOT DETECTED NOT DETECTED Final    Comment: Performed at [facility], [address] [place], [place] [address]       Radiology Studies last 3 days: CT Angio Chest PE W and/or Wo Contrast Result Date: 10/17/2023 CLINICAL DATA:  Shortness of breath.  History of COPD.  Ex-smoker. EXAM: CT ANGIOGRAPHY CHEST WITH CONTRAST TECHNIQUE: Multidetector CT imaging of the chest was performed using the standard protocol during bolus administration of intravenous contrast. Multiplanar CT image reconstructions and MIPs were obtained to evaluate the vascular anatomy. RADIATION DOSE REDUCTION: This exam was performed according to the departmental dose-optimization program which includes automated exposure control, adjustment of the mA and/or kV according to patient size and/or use of iterative reconstruction technique. CONTRAST:  60mL OMNIPAQUE IOHEXOL 350 MG/ML SOLN COMPARISON:  Plain film of earlier today.  No prior CT. FINDINGS: Cardiovascular: The quality of this exam for evaluation of pulmonary embolism is good. Although the bolus is well timed, limitations include minimal motion, EKG wire artifact and patient arm position, not raised above the head. No evidence of pulmonary embolism. Advanced aortic and branch vessel atherosclerosis. The aorta is not well opacified secondary to bolus timing. Moderate cardiomegaly, without pericardial effusion. Left main and 3 vessel coronary artery calcification. Mediastinum/Nodes: No mediastinal or hilar adenopathy. Lungs/Pleura: Small bilateral pleural effusions. Moderate centrilobular emphysema. Right middle lobe endobronchial compression/narrowing at the level of the proximal segmental bronchi including on 68/4. This results in right middle lobe collapse. No well-defined obstructive mass. Biapical pleuroparenchymal scarring. Left greater than right base dependent compressive atelectasis. Upper Abdomen: Reflux of contrast into the hepatic veins suggests elevated right heart pressures. Artifact degradation continuing into the upper abdomen. No gross acute finding. Musculoskeletal: Lower thoracic  spondylosis with S-shaped thoracolumbar spine curvature. Review of the MIP images confirms the above findings. IMPRESSION: 1.  No evidence of pulmonary embolism. 2. Mild multifocal degradation as detailed above. 3. Small bilateral pleural effusions.  Findings of elevated right heart pressures. Suspect a component of fluid overload. 4. Right middle lobe endobronchial obstruction or compression at the level of the proximal subsegmental bronchi resulting in right middle lobe collapse. No well-defined obstructive mass. Consider further evaluation with bronchoscopy with attention to the right middle lobe bronchus. Alternatively, especially given patient age and comorbidities, CT follow-up at 6 months could be performed. 5. Aortic atherosclerosis (ICD10-I70.0), coronary artery atherosclerosis and emphysema (ICD10-J43.9). Electronically Signed   By: Jeronimo Greaves M.D.   On: 10/17/2023 18:40   DG Chest 2 View Result Date: 10/17/2023 CLINICAL DATA:  Shortness of breath EXAM: CHEST - 2 VIEW COMPARISON:  Chest radiograph dated 06/26/2022 FINDINGS: Normal lung volumes. No focal consolidations. Trace blunting of bilateral costophrenic angles. No pneumothorax. Similar mildly enlarged cardiomediastinal silhouette. No acute osseous abnormality. Unchanged compression deformity of T4. IMPRESSION: 1. Small bilateral pleural effusions. 2. Similar mild cardiomegaly. Electronically Signed   By: Agustin Cree M.D.   On: 10/17/2023 15:23       Time spent: 50 min    Sunnie Nielsen, DO [facility] 10/18/2023, 10:34 AM    Dictation software may have been used to generate the above note. Typos may occur and escape review in typed/dictated notes. Please contact Dr Lyn Hollingshead directly for clarity if needed.  Staff may message me via secure chat in Epic  but this may not receive an immediate response,  please page me for urgent matters!  If 7PM-7AM, please contact night coverage [email or web address]

## 2023-10-18 NOTE — TOC Initial Note (Signed)
 Transition of Care [facility]) - Initial/Assessment Note    Patient Details  Name: Angela Mason MRN: [id number] Date of Birth: Feb 10, 1933  Transition [facility]) CM/SW Contact:    Liliana Cline, LCSW Phone Number: 10/18/2023, 11:49 AM  Clinical Narrative:                 Patient resides at [facility]. Confirmed with [employer] TOC to follow for needs and PT/OT recs.  Expected Discharge Plan: Home/Self Care Barriers to Discharge: Continued Medical Work up   Patient Goals and CMS Choice            Expected Discharge Plan and Services                                              Prior Living Arrangements/Services                       Activities of Daily Living      Permission Sought/Granted                  Emotional Assessment              Admission diagnosis:  Atrial fibrillation with RVR (HCC) [I48.91] Acute CHF (HCC) [I50.9] Patient Active Problem List   Diagnosis Date Noted   Atrial fibrillation with RVR (HCC) 10/17/2023   Acute CHF (HCC) 10/17/2023   Myocardial injury 10/17/2023   COPD exacerbation (HCC) 10/17/2023   Acute respiratory failure with hypoxia (HCC) 10/17/2023   Acute paranoid reaction (HCC) 09/12/2022   Constipation by delayed colonic transit 08/06/2022   Pedal edema 08/06/2022   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) 05/12/2022   HLD (hyperlipidemia) 04/30/2022   Hypothyroidism 04/30/2022   Other symptoms and signs involving cognitive functions and awareness 03/03/2022   History of falling 02/01/2022   Muscle weakness (generalized) 02/01/2022   Need for assistance with personal care 02/01/2022   Other abnormalities of gait and mobility 02/01/2022   Other specified fracture of left pubis, subsequent encounter for fracture with routine healing 02/01/2022   Personal history of other malignant neoplasm of  skin 02/01/2022   Protein-calorie malnutrition, severe 01/29/2022   Closed fracture of superior ramus of left pubis (HCC) 01/27/2022   Leukocytosis 01/27/2022   Accidental fall 01/27/2022   COPD (chronic obstructive pulmonary disease) (HCC) 01/27/2022   MDD (major depressive disorder), recurrent episode, moderate (HCC) 07/31/2021   Insomnia 07/31/2021   History of delirium 03/05/2021   High risk medication use 03/05/2021   Unspecified macular degeneration 10/16/2020   Severe major depression, single episode, with psychotic features (HCC) 10/12/2020   UTI (urinary tract infection) 10/04/2020   HTN (hypertension)    Hyponatremia    Underweight 08/08/2020   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 08/04/2018   Cystocele, midline 05/05/2017   Nocturia 10/07/2016   Unstable bladder 10/07/2016   Vaginal atrophy 01/23/2016   Procidentia of uterus 11/01/2015   Midline cystocele 11/01/2015   PCP:  [name], MD Pharmacy:   [facility] [id number] [place], [place] - [address] [place] [address] Phone: [phone] Fax: [phone]  [facility] - [place], [place] - [address] [place] [address] Phone: [phone] Fax: [phone]  Social Drivers of Health (SDOH) Social History: SDOH Screenings   Depression (PHQ2-9): Low Risk  (01/14/2023)  Tobacco Use: Medium Risk (10/17/2023)   SDOH Interventions:     Readmission Risk Interventions     No data to display

## 2023-10-18 NOTE — ED Notes (Signed)
 Provider notified of trending heart rate of 120-138 despite being on ami drip. Pt is AxO, and in NAD. New orders pending.

## 2023-10-18 NOTE — Hospital Course (Addendum)
 Hospital course / significant events:   HPI: Angela Mason is a 89 y.o. female with medical history significant of HTN, HLD, COPD, hypothyroidism, cystocele, who presents with SOB x several days, worsening. Assoc w/ palpiatations, heart racing, bl le edema.   03/22: admitted to hospitalist for CHF, Afib, Lung RML obstrcution/collapse noted on CTA chest, PE r/o, also w/ acute hypoxic resp fail. Requiring O2, amiodarone infusion, heparin infusion. Cardiology consulted.  03/23: no output documented but pt reports strong UOP. Breathign is about the same. Continuing diuresis. Await echo report. Added beta blocker, rate improved. Continuing on amiodarone infusion for now. Further recs per cardiology / pending Echo.      Consultants:  Cardiology   Procedures/Surgeries: none      ASSESSMENT & PLAN:   Acute CHF  New dx - uncertain systolic/diastolic/mix  Diuresis - lasix IV  Echo pending read  Daily weight, I&O, fluid restriction diet  Cardiology consult    Myocardial injury: trop  46 --> 60 peak --> downtrend Likely demand ischemia. Echo pending read   New onset atrial fibrillation with RVR CHADS2 score is 5, will need chronic anticoagulants.   TSH and T4 normal. IV heparin drip Amiodarone drip Metroprolol Echo pending read  Cardiology consult    COPD exacerbation  wheezing on auscultation, emphysema on CTA chest  Bronchodilators Doxycycline 100 mg twice daily Solu-Medrol 40 mg twice daily  As needed Mucinex   Acute respiratory failure with hypoxia  combination of acute CHF and COPD exacerbation. Trest underlying cause(s) as above Bronchodilators and as needed Mucinex Nasal cannula oxygen to maintain oxygen saturation above 93%  Hyponatremia: Sodium 129. Likely d/t fluid overload CHF Fluid restriction Every 6 hour BMP   HLD (hyperlipidemia) CAD, Aortic Atherosclerosis Pravastatin (patient is on lovastatin at home), consider escalation given CAD/atherosclerosis  noted on CT   Protein-calorie malnutrition, severe: Body weight 43.1 kg, BMI 18.55 Ensure Dietician consult   Right middle lobe collapse:  this is incidental finding by CTA. CTA showed right middle lobe endobronchial obstruction or compression at the level of the proximal subsegmental bronchi resulting in right middle lobe collapse. No well-defined obstructive mass.  may need to consult pulmonology for possible bronchoscopy or alternatively follow-up CT scan in 3-6 months. Personally reviewed imaging        underweight based on BMI: Body mass index is 18.55 kg/m.  Underweight - under 18  overweight - 25 to 29 obese - 30 or more Class 1 obesity: BMI of 30.0 to 34 Class 2 obesity: BMI of 35.0 to 39 Class 3 obesity: BMI of 40.0 to 49 Super Morbid Obesity: BMI 50-59 Super-super Morbid Obesity: BMI 60+ Significantly low or high BMI is associated with higher medical risk.  Weight management advised as adjunct to other disease management and risk reduction treatments    DVT prophylaxis: heparin IV fluids: no continuous IV fluids  Nutrition: regular diet w/ fluid restriction Central lines / other devices: none  Code Status: FULL CODE ACP documentation reviewed: none on file in [facility]  TOC needs: TBD Medical barriers to dispo: CHF, COPD - on IV drips at this time, possibly will need further cardial/pulm workup. Expected medical readiness for discharge few more days.

## 2023-10-19 ENCOUNTER — Other Ambulatory Visit (HOSPITAL_COMMUNITY): Payer: Self-pay

## 2023-10-19 DIAGNOSIS — I4891 Unspecified atrial fibrillation: Secondary | ICD-10-CM | POA: Diagnosis not present

## 2023-10-19 LAB — CBC
HCT: 31.4 % — ABNORMAL LOW (ref 36.0–46.0)
Hemoglobin: 10.5 g/dL — ABNORMAL LOW (ref 12.0–15.0)
MCH: 32.3 pg (ref 26.0–34.0)
MCHC: 33.4 g/dL (ref 30.0–36.0)
MCV: 96.6 fL (ref 80.0–100.0)
Platelets: 238 10*3/uL (ref 150–400)
RBC: 3.25 MIL/uL — ABNORMAL LOW (ref 3.87–5.11)
RDW: 13.8 % (ref 11.5–15.5)
WBC: 13.3 10*3/uL — ABNORMAL HIGH (ref 4.0–10.5)
nRBC: 0.2 % (ref 0.0–0.2)

## 2023-10-19 LAB — HEPARIN LEVEL (UNFRACTIONATED): Heparin Unfractionated: 0.39 [IU]/mL (ref 0.30–0.70)

## 2023-10-19 LAB — BASIC METABOLIC PANEL
Anion gap: 10 (ref 5–15)
BUN: 41 mg/dL — ABNORMAL HIGH (ref 8–23)
CO2: 26 mmol/L (ref 22–32)
Calcium: 8.4 mg/dL — ABNORMAL LOW (ref 8.9–10.3)
Chloride: 94 mmol/L — ABNORMAL LOW (ref 98–111)
Creatinine, Ser: 1.06 mg/dL — ABNORMAL HIGH (ref 0.44–1.00)
GFR, Estimated: 51 mL/min — ABNORMAL LOW (ref >60)
Glucose, Bld: 148 mg/dL — ABNORMAL HIGH (ref 70–99)
Potassium: 4.5 mmol/L (ref 3.5–5.1)
Sodium: 130 mmol/L — ABNORMAL LOW (ref 135–145)

## 2023-10-19 MED ORDER — AMIODARONE HCL 200 MG PO TABS: 200.0000 mg | ORAL_TABLET | Freq: Every day | ORAL | Status: DC

## 2023-10-19 MED ORDER — AMIODARONE HCL 200 MG PO TABS
200.0000 mg | ORAL_TABLET | Freq: Every day | ORAL | Status: DC
Start: 2023-10-29 — End: 2023-10-21

## 2023-10-19 MED ORDER — ENSURE ENLIVE PO LIQD
237.0000 mL | Freq: Three times a day (TID) | ORAL | Status: DC
  Administered 2023-10-19 – 2023-10-20 (×4): 237 mL via ORAL

## 2023-10-19 MED ORDER — ORAL CARE MOUTH RINSE: 15.0000 mL | OROMUCOSAL | Status: DC | PRN

## 2023-10-19 MED ORDER — AMIODARONE HCL 200 MG PO TABS
400.0000 mg | ORAL_TABLET | Freq: Two times a day (BID) | ORAL | Status: DC
  Administered 2023-10-19 – 2023-10-21 (×5): 400 mg via ORAL
  Filled 2023-10-19 (×5): qty 2

## 2023-10-19 MED ORDER — METOPROLOL SUCCINATE ER 25 MG PO TB24: 25.0000 mg | ORAL_TABLET | Freq: Two times a day (BID) | ORAL | Status: DC

## 2023-10-19 MED ORDER — AMIODARONE HCL 200 MG PO TABS: 400.0000 mg | ORAL_TABLET | Freq: Two times a day (BID) | ORAL | Status: DC

## 2023-10-19 MED ORDER — LORAZEPAM 2 MG/ML IJ SOLN
1.0000 mg | Freq: Four times a day (QID) | INTRAMUSCULAR | Status: DC | PRN
  Administered 2023-10-19: 1 mg via INTRAVENOUS
  Filled 2023-10-19: qty 1

## 2023-10-19 MED ORDER — APIXABAN 2.5 MG PO TABS
2.5000 mg | ORAL_TABLET | Freq: Two times a day (BID) | ORAL | Status: DC
  Administered 2023-10-19 – 2023-10-21 (×5): 2.5 mg via ORAL
  Filled 2023-10-19 (×5): qty 1

## 2023-10-19 MED ORDER — METOPROLOL SUCCINATE ER 50 MG PO TB24
50.0000 mg | ORAL_TABLET | Freq: Two times a day (BID) | ORAL | Status: DC
  Administered 2023-10-19 – 2023-10-20 (×2): 50 mg via ORAL
  Filled 2023-10-19 (×4): qty 1

## 2023-10-19 NOTE — Progress Notes (Signed)
 [facility] CARDIOLOGY PROGRESS NOTE   [name] MRN: [id number] DOB/AGE: [birth year] 88 y.o.  Admit date: 10/17/2023 Referring Physician Dr. Sunnie Nielsen Primary Physician [name], MD  Primary Cardiologist None Reason for Consultation AF RVR  HPI: Angela Mason is a 89 y.o. female with a past medical history of hypertension, hyperlipidemia, COPD, hypothyroidism who presented to the ED on 10/17/2023 for shortness of breath. Found to be in AF RVR. Cardiology consulted for further evaluation.   Interval History:  -Patient seen and examined this AM, reports she is feeling well today.  -Denies any CP, SOB, palpitations.  -Converted to NSR yesterday. Remains on amio gtt.   Review of systems complete and found to be negative unless listed above    Vitals:   10/18/23 2021 10/19/23 0412 10/19/23 0500 10/19/23 0832  BP: 101/60 (!) 143/78  132/77  Pulse: 67 64  70  Resp: 16 18    Temp: 97.7 F (36.5 C) 97.7 F (36.5 C)  97.9 F (36.6 C)  TempSrc: Angela Angela    SpO2: 97% 94%  95%  Weight:   46.4 kg   Height:         Intake/Output Summary (Last 24 hours) at 10/19/2023 1017 Last data filed at 10/19/2023 0758 Gross per 24 hour  Intake 966.08 ml  Output 451 ml  Net 515.08 ml     PHYSICAL EXAM General: Well appearing elderly female, well nourished, in no acute distress. HEENT: Normocephalic and atraumatic. Neck: No JVD.  Lungs: Normal respiratory effort on 2L [place]  . Clear bilaterally to auscultation. No wheezes, crackles, rhonchi.  Heart: HRRR. Normal S1 and S2 without gallops or murmurs. Radial & DP pulses 2+ bilaterally. Abdomen: Non-distended appearing.  Msk: Normal strength and tone for age. Extremities: No clubbing, cyanosis or edema.   Neuro: Alert and oriented X 3. Psych: Mood appropriate, affect congruent.    LABS: Basic Metabolic Panel: Recent Labs    10/17/23 1638 10/17/23 1930 10/18/23 1206 10/19/23 0628  NA  --    < > 133*  130*  K  --    < > 4.8 4.5  CL  --    < > 98 94*  CO2  --    < > 27 26  GLUCOSE  --    < > 172* 148*  BUN  --    < > 24* 41*  CREATININE  --    < > 0.66 1.06*  CALCIUM  --    < > 8.1* 8.4*  MG 2.3  --   --   --   PHOS 3.2  --   --   --    < > = values in this interval not displayed.   Liver Function Tests: Recent Labs    10/17/23 1502  AST 30  ALT 39  ALKPHOS 70  BILITOT 0.9  PROT 6.4*  ALBUMIN 3.2*   No results for input(s): "LIPASE", "AMYLASE" in the last 72 hours. CBC: Recent Labs    10/18/23 0250 10/19/23 0628  WBC 11.4* 13.3*  HGB 11.1* 10.5*  HCT 33.9* 31.4*  MCV 98.5 96.6  PLT 208 238   Cardiac Enzymes: Recent Labs    10/17/23 1930 10/17/23 2341 10/18/23 0250  TROPONINIHS 54* 42* 36*   BNP: Recent Labs    10/17/23 1500  BNP 667.2*   D-Dimer: No results for input(s): "DDIMER" in the last 72 hours. Hemoglobin A1C: No results for input(s): "HGBA1C" in the last 72 hours. Fasting Lipid  Panel: No results for input(s): "CHOL", "HDL", "LDLCALC", "TRIG", "CHOLHDL", "LDLDIRECT" in the last 72 hours. Thyroid Function Tests: Recent Labs    10/17/23 1502  TSH 0.968   Anemia Panel: No results for input(s): "VITAMINB12", "FOLATE", "FERRITIN", "TIBC", "IRON", "RETICCTPCT" in the last 72 hours.  ECHOCARDIOGRAM COMPLETE Result Date: 10/18/2023    ECHOCARDIOGRAM REPORT   Patient Name:   Angela Mason Date of Exam: 10/18/2023 Medical Rec #:  [id number]       Height:       60.0 in Accession #:    [id number]      Weight:       95.0 lb Date of Birth:  [birth year]       BSA:          1.360 m Patient Age:    89 years        BP:           118/83 mmHg Patient Gender: F               HR:           116 bpm. Exam Location:  [facility] Procedure: 2D Echo, Cardiac Doppler and Color Doppler (Both Spectral and Color            Flow Doppler were utilized during procedure). Indications:     CHF- Acute Diastolic I50.31  History:         Patient has no prior history of Echocardiogram  examinations.  Sonographer:     Elwin Sleight RDCS Referring Phys:  Wynona Neat NIU Diagnosing Phys: Angela Mason  Sonographer Comments: Image acquisition challenging due to respiratory motion. IMPRESSIONS  1. Left ventricular ejection fraction, by estimation, is 45 to 50%. The left ventricle has mildly decreased function. The left ventricle demonstrates global hypokinesis. The left ventricular internal cavity size was moderately dilated. There is mild concentric left ventricular hypertrophy. Left ventricular diastolic parameters are indeterminate.  2. Right ventricular systolic function is moderately reduced. The right ventricular size is moderately enlarged. Mildly increased right ventricular wall thickness.  3. Left atrial size was severely dilated.  4. Right atrial size was severely dilated.  5. The mitral valve is grossly normal. Moderate mitral valve regurgitation.  6. Tricuspid valve regurgitation is mild to moderate.  7. The aortic valve is calcified. Aortic valve regurgitation is not visualized. Aortic valve sclerosis/calcification is present, without any evidence of aortic stenosis. FINDINGS  Left Ventricle: Left ventricular ejection fraction, by estimation, is 45 to 50%. The left ventricle has mildly decreased function. The left ventricle demonstrates global hypokinesis. Strain was performed and the global longitudinal strain is indeterminate. The left ventricular internal cavity size was moderately dilated. There is mild concentric left ventricular hypertrophy. Left ventricular diastolic parameters are indeterminate. Right Ventricle: The right ventricular size is moderately enlarged. Mildly increased right ventricular wall thickness. Right ventricular systolic function is moderately reduced. Left Atrium: Left atrial size was severely dilated. Right Atrium: Right atrial size was severely dilated. Pericardium: There is no evidence of pericardial effusion. Mitral Valve: The mitral valve is grossly normal.  Moderate mitral valve regurgitation. Tricuspid Valve: The tricuspid valve is grossly normal. Tricuspid valve regurgitation is mild to moderate. Aortic Valve: The aortic valve is calcified. Aortic valve regurgitation is not visualized. Aortic valve sclerosis/calcification is present, without any evidence of aortic stenosis. Aortic valve peak gradient measures 10.6 mmHg. Pulmonic Valve: The pulmonic valve was grossly normal. Pulmonic valve regurgitation is mild. Aorta: The aortic root, ascending aorta and aortic arch are all  structurally normal, with no evidence of dilitation or obstruction. IAS/Shunts: No atrial level shunt detected by color flow Doppler. Additional Comments: 3D was performed not requiring image post processing on an independent workstation and was indeterminate.  LEFT VENTRICLE PLAX 2D LVIDd:         3.20 cm   Diastology LVIDs:         2.40 cm   LV e' lateral: 12.40 cm/s LV PW:         1.10 cm LV IVS:        1.00 cm LVOT diam:     1.90 cm LV SV:         29 LV SV Index:   22 LVOT Area:     2.84 cm  RIGHT VENTRICLE RV Basal diam:  2.80 cm LEFT ATRIUM             Index        RIGHT ATRIUM           Index LA diam:        3.50 cm 2.57 cm/m   RA Area:     17.00 cm LA Vol (A2C):   32.7 ml 24.04 ml/m  RA Volume:   48.00 ml  35.29 ml/m LA Vol (A4C):   39.2 ml 28.82 ml/m LA Biplane Vol: 39.8 ml 29.26 ml/m  AORTIC VALVE                 PULMONIC VALVE AV Area (Vmax): 1.29 cm     PV Vmax:        0.64 m/s AV Vmax:        162.50 cm/s  PV Peak grad:   1.6 mmHg AV Peak Grad:   10.6 mmHg    RVOT Peak grad: 1 mmHg LVOT Vmax:      73.90 cm/s LVOT Vmean:     50.100 cm/s LVOT VTI:       0.104 m  AORTA Ao Root diam: 2.80 cm TRICUSPID VALVE TR Peak grad:   33.6 mmHg TR Vmax:        290.00 cm/s  SHUNTS Systemic VTI:  0.10 m Systemic Diam: 1.90 cm Angela Mason Electronically signed by Angela Mason Signature Date/Time: 10/18/2023/12:36:21 PM    Final    CT Angio Chest PE W and/or Wo Contrast Result Date:  10/17/2023 CLINICAL DATA:  Shortness of breath.  History of COPD.  Ex-smoker. EXAM: CT ANGIOGRAPHY CHEST WITH CONTRAST TECHNIQUE: Multidetector CT imaging of the chest was performed using the standard protocol during bolus administration of intravenous contrast. Multiplanar CT image reconstructions and MIPs were obtained to evaluate the vascular anatomy. RADIATION DOSE REDUCTION: This exam was performed according to the departmental dose-optimization program which includes automated exposure control, adjustment of the mA and/or kV according to patient size and/or use of iterative reconstruction technique. CONTRAST:  60mL OMNIPAQUE IOHEXOL 350 MG/ML SOLN COMPARISON:  Plain film of earlier today.  No prior CT. FINDINGS: Cardiovascular: The quality of this exam for evaluation of pulmonary embolism is good. Although the bolus is well timed, limitations include minimal motion, EKG wire artifact and patient arm position, not raised above the head. No evidence of pulmonary embolism. Advanced aortic and branch vessel atherosclerosis. The aorta is not well opacified secondary to bolus timing. Moderate cardiomegaly, without pericardial effusion. Left main and 3 vessel coronary artery calcification. Mediastinum/Nodes: No mediastinal or hilar adenopathy. Lungs/Pleura: Small bilateral pleural effusions. Moderate centrilobular emphysema. Right middle lobe endobronchial compression/narrowing at the level of the proximal segmental  bronchi including on 68/4. This results in right middle lobe collapse. No well-defined obstructive mass. Biapical pleuroparenchymal scarring. Left greater than right base dependent compressive atelectasis. Upper Abdomen: Reflux of contrast into the hepatic veins suggests elevated right heart pressures. Artifact degradation continuing into the upper abdomen. No gross acute finding. Musculoskeletal: Lower thoracic spondylosis with S-shaped thoracolumbar spine curvature. Review of the MIP images confirms the  above findings. IMPRESSION: 1.  No evidence of pulmonary embolism. 2. Mild multifocal degradation as detailed above. 3. Small bilateral pleural effusions. Findings of elevated right heart pressures. Suspect a component of fluid overload. 4. Right middle lobe endobronchial obstruction or compression at the level of the proximal subsegmental bronchi resulting in right middle lobe collapse. No well-defined obstructive mass. Consider further evaluation with bronchoscopy with attention to the right middle lobe bronchus. Alternatively, especially given patient age and comorbidities, CT follow-up at 6 months could be performed. 5. Aortic atherosclerosis (ICD10-I70.0), coronary artery atherosclerosis and emphysema (ICD10-J43.9). Electronically Signed   By: Jeronimo Greaves M.D.   On: 10/17/2023 18:40   DG Chest 2 View Result Date: 10/17/2023 CLINICAL DATA:  Shortness of breath EXAM: CHEST - 2 VIEW COMPARISON:  Chest radiograph dated 06/26/2022 FINDINGS: Normal lung volumes. No focal consolidations. Trace blunting of bilateral costophrenic angles. No pneumothorax. Similar mildly enlarged cardiomediastinal silhouette. No acute osseous abnormality. Unchanged compression deformity of T4. IMPRESSION: 1. Small bilateral pleural effusions. 2. Similar mild cardiomegaly. Electronically Signed   By: Agustin Cree M.D.   On: 10/17/2023 15:23     ECHO as above  TELEMETRY reviewed by me 10/19/23: sinus rhythm rate 70s  EKG reviewed by me 10/19/23: today's EKG with NSR, initial EKG on presentation with AF RVR rate 131 bpm  DATA reviewed by me 10/19/23: last 24h vitals tele labs imaging I/O, hospitalist progress note  Principal Problem:   Acute CHF (HCC) Active Problems:   HLD (hyperlipidemia)   Hypothyroidism   Hyponatremia   Protein-calorie malnutrition, severe   Atrial fibrillation with RVR (HCC)   Myocardial injury   COPD exacerbation (HCC)   Acute respiratory failure with hypoxia (HCC)    ASSESSMENT AND  PLAN: Angela Mason is a 89 y.o. female with a past medical history of hypertension, hyperlipidemia, COPD, hypothyroidism who presented to the ED on 10/17/2023 for shortness of breath. Found to be in AF RVR. Cardiology consulted for further evaluation.   # Atrial fibrillation RVR # New onset atrial fibrillation # Acute HFmrEF Patient with no hx of heart failure presented with SOB and BNP elevated to 667. EKG reveal AF RVR, no prior hx of AF. Started on IV lasix and IV dilt in the ED ultimately transitioned to amiodarone due to poor response. Echo this admission with EF 45-50%, global hypokinesis.  -Transition amiodarone to po 400 bid x10 days then decrease to 200 mg daily.  -Hold further diuresis. Monitor renal function closely.  -Switch metoprolol to succinate 25 mg twice daily. Continue and farxiga 10 mg daily.  -Continue pravastatin 20 mg daily and aspirin 81 mg daily.  -Continue IV heparin, ?pulmonary mass on CT. Will await pulm plan for further workup before switching to DOAC.   This patient's case was discussed and created with Dr. Corky Sing and he is in agreement.  Signed:  Gale Journey, PA-C  10/19/2023, 10:17 AM [facility] Cardiology

## 2023-10-19 NOTE — Evaluation (Signed)
 Physical Therapy Evaluation Patient Details Name: Angela Mason MRN: [id number] DOB: 1933/01/03 Today's Date: 10/19/2023  History of Present Illness  Pt is an 89 yo female that presented to ED for SOB, dizziness, swelling of bilateral legs. Workup showed new onset afib with RVR. PMH of HLD, HTN, COPD, macular degeneration.  Clinical Impression  Pt A&Ox4, reported no pain. Per pt at report at baseline she is modI for ambulation with her rollator, standby assist for bathing from [facility] staff (helps her wash her back). Able to dress modI except compression socks and facility provides meals/cleaning.   Supine to sit with extra time, and minA. Pt stated she tends to pull on rollator at bedside to assist with weight shifting. Good sitting balance noted. Sit <> stand with RW/CGA, pt with posterior lean but able to correct and manage over time without assistance. (Pt stated she wears shoes all the time, but not available at the hospital). She ambulated ~130ft with RW and CGA. Did endorse some fatigue compared to baseline.  Overall the patient demonstrated deficits (see "PT Problem List") that impede the patient's functional abilities, safety, and mobility and would benefit from skilled PT intervention.          If plan is discharge home, recommend the following: A little help with bathing/dressing/bathroom;Assistance with cooking/housework;Assist for transportation;Help with stairs or ramp for entrance   Can travel by private vehicle        Equipment Recommendations None recommended by PT (pt has rollator at home)  Recommendations for Other Services       Functional Status Assessment Patient has had a recent decline in their functional status and demonstrates the ability to make significant improvements in function in a reasonable and predictable amount of time.     Precautions / Restrictions Precautions Precautions: Fall Recall of Precautions/Restrictions:  Intact Restrictions Weight Bearing Restrictions Per Provider Order: No      Mobility  Bed Mobility Overal bed mobility: Needs Assistance Bed Mobility: Supine to Sit     Supine to sit: Min assist     General bed mobility comments: pt uses rollator at bedside to assist with scooting out of bed, minA handheld assist to help scoot    Transfers Overall transfer level: Needs assistance Equipment used: Rolling walker (2 wheels) Transfers: Sit to/from Stand Sit to Stand: Contact guard assist           General transfer comment: posterior lean initially but pt able to address and manage without PT involvement, just time    Ambulation/Gait Ambulation/Gait assistance: Contact guard assist Gait Distance (Feet): 100 Feet Assistive device: Rolling walker (2 wheels)   Gait velocity: decreased     General Gait Details: varying gait speed but overall decreased, pt did report increased fatigue compared to baseline  Stairs            Wheelchair Mobility     Tilt Bed    Modified Rankin (Stroke Patients Only)       Balance Overall balance assessment: Needs assistance Sitting-balance support: Feet supported Sitting balance-Leahy Scale: Good     Standing balance support: Bilateral upper extremity supported Standing balance-Leahy Scale: Fair Standing balance comment: improved safety and steadiness with RW                             Pertinent Vitals/Pain Pain Assessment Pain Assessment: No/denies pain    Home Living Family/patient expects to be discharged to:: Assisted living  Home Equipment: Rollator (4 wheels) Additional Comments: [facility]    Prior Function Prior Level of Function : Needs assist             Mobility Comments: rollator at all times ADLs Comments: facility provides meals, cleaning, standby assist for bating (does help her wash her back), assist to don compression socks     Extremity/Trunk  Assessment   Upper Extremity Assessment Upper Extremity Assessment: Generalized weakness    Lower Extremity Assessment Lower Extremity Assessment: Generalized weakness    Cervical / Trunk Assessment Cervical / Trunk Assessment: Normal  Communication        Cognition Arousal: Alert Behavior During Therapy: WFL for tasks assessed/performed   PT - Cognitive impairments: No apparent impairments                       PT - Cognition Comments: pt alert, oriented to self, place, situation, may have STM deficits; needed several conversations about why she is not allowed to mobilize without staff Following commands: Intact       Cueing Cueing Techniques: Verbal cues     General Comments      Exercises     Assessment/Plan    PT Assessment Patient needs continued PT services  PT Problem List Decreased strength;Decreased activity tolerance;Decreased balance;Decreased mobility       PT Treatment Interventions Balance training;DME instruction;Gait training;Neuromuscular re-education;Stair training;Functional mobility training;Patient/family education;Therapeutic activities;Therapeutic exercise    PT Goals (Current goals can be found in the Care Plan section)  Acute Rehab PT Goals Patient Stated Goal: to go home PT Goal Formulation: With patient Time For Goal Achievement: 11/02/23 Potential to Achieve Goals: Good    Frequency Min 2X/week     Co-evaluation               AM-PAC PT "6 Clicks" Mobility  Outcome Measure Help needed turning from your back to your side while in a flat bed without using bedrails?: A Little Help needed moving from lying on your back to sitting on the side of a flat bed without using bedrails?: A Little Help needed moving to and from a bed to a chair (including a wheelchair)?: A Little Help needed standing up from a chair using your arms (e.g., wheelchair or bedside chair)?: A Little Help needed to walk in hospital room?: A  Little Help needed climbing 3-5 steps with a railing? : A Lot 6 Click Score: 17    End of Session   Activity Tolerance: Patient tolerated treatment well Patient left: in chair;with call bell/phone within reach;with chair alarm set Nurse Communication: Mobility status PT Visit Diagnosis: Other abnormalities of gait and mobility (R26.89);Difficulty in walking, not elsewhere classified (R26.2);Muscle weakness (generalized) (M62.81)    Time: 4098-1191 PT Time Calculation (min) (ACUTE ONLY): 29 min   Charges:   PT Evaluation $PT Eval Low Complexity: 1 Low PT Treatments $Therapeutic Activity: 23-37 mins PT General Charges $$ ACUTE PT VISIT: 1 Visit         Olga Coaster PT, DPT 10:39 AM,10/19/23

## 2023-10-19 NOTE — Progress Notes (Addendum)
 PROGRESS NOTE    Angela Mason   BJY:[id number] DOB: 22-Mar-1933  DOA: 10/17/2023 Date of Service: 10/19/23 which is hospital day 2  PCP: [name], MD    Hospital course / significant events:   HPI: Angela Mason is a 89 y.o. female with medical history significant of HTN, HLD, COPD, hypothyroidism, cystocele, who presents with SOB x several days, worsening. Assoc w/ palpiatations, heart racing, bl le edema.   03/22: admitted to hospitalist for CHF, Afib, Lung RML obstrcution/collapse noted on CTA chest, PE r/o, also w/ acute hypoxic resp fail. Requiring O2, amiodarone infusion, heparin infusion. Cardiology consulted.  03/23: no output documented but pt reports strong UOP. Breathign is about the same. Continuing diuresis. Await echo report. Added beta blocker, rate improved. Continuing on amiodarone infusion for now. Further recs per cardiology / pending Echo --> Echo EF 45-50%, mild decreased LV fxn, LV global hypokinesis, indeterminate diastolic parameters, RV fxn moderately reduced, LA and RA severely dilated, moderate MR, mild-mod TR, Aortic valve calcification w/o stenosis.  03/24: d/w pulmonary, may f/u in office re: RML question obstruction. Coming off IV infusions and onto po meds.      Consultants:  Cardiology   Procedures/Surgeries: none      ASSESSMENT & PLAN:   Acute CHF - HFpEF Diuresis - lasix IV held for now, Cr slightly up  Metoprolol, Farxiga, statin  Daily weight, I&O, fluid restriction diet  Cardiology following     New onset atrial fibrillation with RVR - resolved Paroxysmal Afib now in sinus  CHADS2 score is 5, will need chronic anticoagulants.   TSH and T4 normal. IV heparin drip --> eliquis  Amiodarone drip --> po  Metroprolol Cardiology following     COPD exacerbation  wheezing on auscultation, emphysema on CTA chest  Bronchodilators Doxycycline 100 mg twice daily Solu-Medrol 40 mg twice daily  As needed Mucinex   Acute  respiratory failure with hypoxia  combination of acute CHF and COPD exacerbation. Trest underlying cause(s) as above Bronchodilators and as needed Mucinex Nasal cannula oxygen to maintain oxygen saturation above 93%  Hyponatremia Likely d/t fluid overload CHF Fluid restriction  Follow BMP   HLD (hyperlipidemia) CAD, Aortic Atherosclerosis Pravastatin (patient is on lovastatin at home), consider escalation given CAD/atherosclerosis noted on CT   Protein-calorie malnutrition, severe: Body weight 43.1 kg, BMI 18.55 Ensure Dietician consult   Right middle lobe collapse:  this is incidental finding by CTA. CTA showed right middle lobe endobronchial obstruction or compression at the level of the proximal subsegmental bronchi resulting in right middle lobe collapse. No well-defined obstructive mass.  D/w pulmonology for possible bronchoscopy or alternatively follow-up CT scan in 3-6 months --> plan to follow in office, Dr Belia Heman to arrange        underweight based on BMI: Body mass index is 18.55 kg/m.  Underweight - under 18  overweight - 25 to 29 obese - 30 or more Class 1 obesity: BMI of 30.0 to 34 Class 2 obesity: BMI of 35.0 to 39 Class 3 obesity: BMI of 40.0 to 49 Super Morbid Obesity: BMI 50-59 Super-super Morbid Obesity: BMI 60+ Significantly low or high BMI is associated with higher medical risk.  Weight management advised as adjunct to other disease management and risk reduction treatments    DVT prophylaxis: heparin --> eliquis  IV fluids: no continuous IV fluids  Nutrition: regular diet w/ fluid restriction Central lines / other devices: none  Code Status: FULL CODE ACP documentation reviewed: none on file in [facility]  TOC needs: TBD Medical barriers to dispo: CHF, COPD. Transitioning off IV meds and to po. Expected medical readiness for discharge tomorrow or following day pending cardiology clearance / tolerating po meds and HR stays at goal               Subjective / Brief ROS:  Patient reports fatigue/generalized weakness  Denies CP/SOB at rest Pain controlled.  Denies new weakness.  Tolerating diet Reports no concerns w/ urination/defecation.   Family Communication: none at this time     Objective Findings:  Vitals:   10/19/23 0412 10/19/23 0500 10/19/23 0832 10/19/23 1233  BP: (!) 143/78  132/77 106/61  Pulse: 64  70 65  Resp: 18     Temp: 97.7 F (36.5 C)  97.9 F (36.6 C) 97.6 F (36.4 C)  TempSrc: Oral     SpO2: 94%  95% 97%  Weight:  46.4 kg    Height:        Intake/Output Summary (Last 24 hours) at 10/19/2023 1358 Last data filed at 10/19/2023 0900 Gross per 24 hour  Intake 1066.08 ml  Output 451 ml  Net 615.08 ml   Filed Weights   10/17/23 1445 10/19/23 0500  Weight: 43.1 kg 46.4 kg    Examination:  Physical Exam Constitutional:      General: She is not in acute distress. Cardiovascular:     Rate and Rhythm: Normal rate and regular rhythm.  Pulmonary:     Effort: Pulmonary effort is normal.     Breath sounds: Decreased breath sounds present. No wheezing or rales.  Musculoskeletal:     Right lower leg: No edema.  Neurological:     General: No focal deficit present.     Mental Status: She is alert and oriented to person, place, and time.  Psychiatric:        Mood and Affect: Mood normal.        Behavior: Behavior normal.          Scheduled Medications:   amiodarone  400 mg Oral BID   Followed by   Melene Muller ON 10/29/2023] amiodarone  200 mg Oral Daily   apixaban  2.5 mg Oral BID   ARIPiprazole  2 mg Oral Q breakfast   aspirin EC  81 mg Oral Daily   dapagliflozin propanediol  10 mg Oral Daily   doxycycline  100 mg Oral Q12H   erythromycin  1 Application Both Eyes QHS   feeding supplement  237 mL Oral TID BM   methylPREDNISolone (SOLU-MEDROL) injection  40 mg Intravenous Q12H   metoprolol succinate  50 mg Oral BID   multivitamin  1 tablet Oral BID   nystatin  1  Application Topical BID   omega-3 acid ethyl esters  1 g Oral Daily   polyvinyl alcohol  1 drop Both Eyes QHS   pravastatin  20 mg Oral q1800    Continuous Infusions:  amiodarone 30 mg/hr (10/19/23 0400)    PRN Medications:  acetaminophen, albuterol, alum & mag hydroxide-simeth, dextromethorphan-guaiFENesin, hydrALAZINE, ipratropium, magnesium hydroxide, ondansetron (ZOFRAN) IV, mouth rinse  Antimicrobials from admission:  Anti-infectives (From admission, onward)    Start     Dose/Rate Route Frequency Ordered Stop   10/17/23 2215  doxycycline (VIBRA-TABS) tablet 100 mg        100 mg Oral Every 12 hours 10/17/23 2212             Data Reviewed:  I have personally reviewed the following...  CBC: Recent Labs  Lab 10/17/23 1502 10/18/23 0250 10/19/23 0628  WBC 11.7* 11.4* 13.3*  HGB 10.9* 11.1* 10.5*  HCT 34.4* 33.9* 31.4*  MCV 101.8* 98.5 96.6  PLT 231 208 238   Basic Metabolic Panel: Recent Labs  Lab 10/17/23 1502 10/17/23 1638 10/17/23 1930 10/18/23 0250 10/18/23 1206 10/19/23 0628  NA 129*  --  131* 132* 133* 130*  K 4.2  --  3.9 3.8 4.8 4.5  CL 95*  --  94* 96* 98 94*  CO2 26  --  27 26 27 26   GLUCOSE 142*  --  121* 147* 172* 148*  BUN 21  --  19 19 24* 41*  CREATININE 0.62  --  0.57 0.56 0.66 1.06*  CALCIUM 7.9*  --  8.1* 8.1* 8.1* 8.4*  MG  --  2.3  --   --   --   --   PHOS  --  3.2  --   --   --   --    GFR: Estimated Creatinine Clearance: 26.4 mL/min (A) (by C-G formula based on SCr of 1.06 mg/dL (H)). Liver Function Tests: Recent Labs  Lab 10/17/23 1502  AST 30  ALT 39  ALKPHOS 70  BILITOT 0.9  PROT 6.4*  ALBUMIN 3.2*   No results for input(s): "LIPASE", "AMYLASE" in the last 168 hours. No results for input(s): "AMMONIA" in the last 168 hours. Coagulation Profile: Recent Labs  Lab 10/17/23 2317  INR 1.1   Cardiac Enzymes: No results for input(s): "CKTOTAL", "CKMB", "CKMBINDEX", "TROPONINI" in the last 168 hours. BNP (last 3  results) No results for input(s): "PROBNP" in the last 8760 hours. HbA1C: No results for input(s): "HGBA1C" in the last 72 hours. CBG: No results for input(s): "GLUCAP" in the last 168 hours. Lipid Profile: No results for input(s): "CHOL", "HDL", "LDLCALC", "TRIG", "CHOLHDL", "LDLDIRECT" in the last 72 hours. Thyroid Function Tests: Recent Labs    10/17/23 1502 10/17/23 1638  TSH 0.968  --   FREET4  --  0.88   Anemia Panel: No results for input(s): "VITAMINB12", "FOLATE", "FERRITIN", "TIBC", "IRON", "RETICCTPCT" in the last 72 hours. Most Recent Urinalysis On File:     Component Value Date/Time   COLORURINE YELLOW (A) 01/28/2022 1254   APPEARANCEUR Hazy (A) 02/25/2023 1316   LABSPEC 1.011 01/28/2022 1254   PHURINE 5.0 01/28/2022 1254   GLUCOSEU Negative 02/25/2023 1316   HGBUR MODERATE (A) 01/28/2022 1254   BILIRUBINUR Negative 02/25/2023 1316   KETONESUR NEGATIVE 01/28/2022 1254   PROTEINUR Negative 02/25/2023 1316   PROTEINUR NEGATIVE 01/28/2022 1254   UROBILINOGEN 0.2 09/19/2022 1544   NITRITE Negative 02/25/2023 1316   NITRITE POSITIVE (A) 01/28/2022 1254   LEUKOCYTESUR 2+ (A) 02/25/2023 1316   LEUKOCYTESUR LARGE (A) 01/28/2022 1254   Sepsis Labs: @LABRCNTIP (procalcitonin:4,lacticidven:4) Microbiology: Recent Results (from the past 240 hours)  SARS Coronavirus 2 by RT PCR (hospital order, performed in [facility] hospital lab) *cepheid single result test* Nasopharyngeal Swab     Status: None   Collection Time: 10/17/23  7:30 PM   Specimen: Nasopharyngeal Swab; Nasal Swab  Result Value Ref Range Status   SARS Coronavirus 2 by RT PCR NEGATIVE NEGATIVE Final    Comment: (NOTE) SARS-CoV-2 target nucleic acids are NOT DETECTED.  The SARS-CoV-2 RNA is generally detectable in upper and lower respiratory specimens during the acute phase of infection. The lowest concentration of SARS-CoV-2 viral copies this assay can detect is 250 copies / mL. A negative result does not  preclude SARS-CoV-2 infection and  should not be used as the sole basis for treatment or other patient management decisions.  A negative result may occur with improper specimen collection / handling, submission of specimen other than nasopharyngeal swab, presence of viral mutation(s) within the areas targeted by this assay, and inadequate number of viral copies (<250 copies / mL). A negative result must be combined with clinical observations, patient history, and epidemiological information.  Fact Sheet for Patients:   RoadLapTop.co.za  Fact Sheet for Healthcare Providers: http://kim-miller.com/  This test is not yet approved or  cleared by the [place] FDA and has been authorized for detection and/or diagnosis of SARS-CoV-2 by FDA under an Emergency Use Authorization (EUA).  This EUA will remain in effect (meaning this test can be used) for the duration of the COVID-19 declaration under Section 564(b)(1) of the Act, 21 U.S.C. section 360bbb-3(b)(1), unless the authorization is terminated or revoked sooner.  Performed at [facility], [address] [place], [place] [address]   Respiratory (~20 pathogens) panel by PCR     Status: None   Collection Time: 10/17/23  7:30 PM   Specimen: Nasopharyngeal Swab; Respiratory  Result Value Ref Range Status   Adenovirus NOT DETECTED NOT DETECTED Final   Coronavirus 229E NOT DETECTED NOT DETECTED Final    Comment: (NOTE) The Coronavirus on the Respiratory Panel, DOES NOT test for the novel  Coronavirus (2019 nCoV)    Coronavirus HKU1 NOT DETECTED NOT DETECTED Final   Coronavirus NL63 NOT DETECTED NOT DETECTED Final   Coronavirus OC43 NOT DETECTED NOT DETECTED Final   Metapneumovirus NOT DETECTED NOT DETECTED Final   Rhinovirus / Enterovirus NOT DETECTED NOT DETECTED Final   Influenza A NOT DETECTED NOT DETECTED Final   Influenza B NOT DETECTED NOT DETECTED Final   Parainfluenza  Virus 1 NOT DETECTED NOT DETECTED Final   Parainfluenza Virus 2 NOT DETECTED NOT DETECTED Final   Parainfluenza Virus 3 NOT DETECTED NOT DETECTED Final   Parainfluenza Virus 4 NOT DETECTED NOT DETECTED Final   Respiratory Syncytial Virus NOT DETECTED NOT DETECTED Final   Bordetella pertussis NOT DETECTED NOT DETECTED Final   Bordetella Parapertussis NOT DETECTED NOT DETECTED Final   Chlamydophila pneumoniae NOT DETECTED NOT DETECTED Final   Mycoplasma pneumoniae NOT DETECTED NOT DETECTED Final    Comment: Performed at [facility], [address], [place], [place] [address]      Radiology Studies last 3 days: ECHOCARDIOGRAM COMPLETE Result Date: 10/18/2023    ECHOCARDIOGRAM REPORT   Patient Name:   TONNI MANSOUR Date of Exam: 10/18/2023 Medical Rec #:  [id number]       Height:       60.0 in Accession #:    [id number]      Weight:       95.0 lb Date of Birth:  [birth year]       BSA:          1.360 m Patient Age:    89 years        BP:           118/83 mmHg Patient Gender: F               HR:           116 bpm. Exam Location:  [facility] Procedure: 2D Echo, Cardiac Doppler and Color Doppler (Both Spectral and Color            Flow Doppler were utilized during procedure). Indications:     CHF- Acute  Diastolic I50.31  History:         Patient has no prior history of Echocardiogram examinations.  Sonographer:     Elwin Sleight RDCS Referring Phys:  Wynona Neat NIU Diagnosing Phys: Adrian Blackwater  Sonographer Comments: Image acquisition challenging due to respiratory motion. IMPRESSIONS  1. Left ventricular ejection fraction, by estimation, is 45 to 50%. The left ventricle has mildly decreased function. The left ventricle demonstrates global hypokinesis. The left ventricular internal cavity size was moderately dilated. There is mild concentric left ventricular hypertrophy. Left ventricular diastolic parameters are indeterminate.  2. Right ventricular systolic function is moderately reduced. The right ventricular  size is moderately enlarged. Mildly increased right ventricular wall thickness.  3. Left atrial size was severely dilated.  4. Right atrial size was severely dilated.  5. The mitral valve is grossly normal. Moderate mitral valve regurgitation.  6. Tricuspid valve regurgitation is mild to moderate.  7. The aortic valve is calcified. Aortic valve regurgitation is not visualized. Aortic valve sclerosis/calcification is present, without any evidence of aortic stenosis. FINDINGS  Left Ventricle: Left ventricular ejection fraction, by estimation, is 45 to 50%. The left ventricle has mildly decreased function. The left ventricle demonstrates global hypokinesis. Strain was performed and the global longitudinal strain is indeterminate. The left ventricular internal cavity size was moderately dilated. There is mild concentric left ventricular hypertrophy. Left ventricular diastolic parameters are indeterminate. Right Ventricle: The right ventricular size is moderately enlarged. Mildly increased right ventricular wall thickness. Right ventricular systolic function is moderately reduced. Left Atrium: Left atrial size was severely dilated. Right Atrium: Right atrial size was severely dilated. Pericardium: There is no evidence of pericardial effusion. Mitral Valve: The mitral valve is grossly normal. Moderate mitral valve regurgitation. Tricuspid Valve: The tricuspid valve is grossly normal. Tricuspid valve regurgitation is mild to moderate. Aortic Valve: The aortic valve is calcified. Aortic valve regurgitation is not visualized. Aortic valve sclerosis/calcification is present, without any evidence of aortic stenosis. Aortic valve peak gradient measures 10.6 mmHg. Pulmonic Valve: The pulmonic valve was grossly normal. Pulmonic valve regurgitation is mild. Aorta: The aortic root, ascending aorta and aortic arch are all structurally normal, with no evidence of dilitation or obstruction. IAS/Shunts: No atrial level shunt detected  by color flow Doppler. Additional Comments: 3D was performed not requiring image post processing on an independent workstation and was indeterminate.  LEFT VENTRICLE PLAX 2D LVIDd:         3.20 cm   Diastology LVIDs:         2.40 cm   LV e' lateral: 12.40 cm/s LV PW:         1.10 cm LV IVS:        1.00 cm LVOT diam:     1.90 cm LV SV:         29 LV SV Index:   22 LVOT Area:     2.84 cm  RIGHT VENTRICLE RV Basal diam:  2.80 cm LEFT ATRIUM             Index        RIGHT ATRIUM           Index LA diam:        3.50 cm 2.57 cm/m   RA Area:     17.00 cm LA Vol (A2C):   32.7 ml 24.04 ml/m  RA Volume:   48.00 ml  35.29 ml/m LA Vol (A4C):   39.2 ml 28.82 ml/m LA Biplane Vol: 39.8 ml 29.26 ml/m  AORTIC VALVE                 PULMONIC VALVE AV Area (Vmax): 1.29 cm     PV Vmax:        0.64 m/s AV Vmax:        162.50 cm/s  PV Peak grad:   1.6 mmHg AV Peak Grad:   10.6 mmHg    RVOT Peak grad: 1 mmHg LVOT Vmax:      73.90 cm/s LVOT Vmean:     50.100 cm/s LVOT VTI:       0.104 m  AORTA Ao Root diam: 2.80 cm TRICUSPID VALVE TR Peak grad:   33.6 mmHg TR Vmax:        290.00 cm/s  SHUNTS Systemic VTI:  0.10 m Systemic Diam: 1.90 cm Adrian Blackwater Electronically signed by Adrian Blackwater Signature Date/Time: 10/18/2023/12:36:21 PM    Final    CT Angio Chest PE W and/or Wo Contrast Result Date: 10/17/2023 CLINICAL DATA:  Shortness of breath.  History of COPD.  Ex-smoker. EXAM: CT ANGIOGRAPHY CHEST WITH CONTRAST TECHNIQUE: Multidetector CT imaging of the chest was performed using the standard protocol during bolus administration of intravenous contrast. Multiplanar CT image reconstructions and MIPs were obtained to evaluate the vascular anatomy. RADIATION DOSE REDUCTION: This exam was performed according to the departmental dose-optimization program which includes automated exposure control, adjustment of the mA and/or kV according to patient size and/or use of iterative reconstruction technique. CONTRAST:  60mL OMNIPAQUE IOHEXOL 350  MG/ML SOLN COMPARISON:  Plain film of earlier today.  No prior CT. FINDINGS: Cardiovascular: The quality of this exam for evaluation of pulmonary embolism is good. Although the bolus is well timed, limitations include minimal motion, EKG wire artifact and patient arm position, not raised above the head. No evidence of pulmonary embolism. Advanced aortic and branch vessel atherosclerosis. The aorta is not well opacified secondary to bolus timing. Moderate cardiomegaly, without pericardial effusion. Left main and 3 vessel coronary artery calcification. Mediastinum/Nodes: No mediastinal or hilar adenopathy. Lungs/Pleura: Small bilateral pleural effusions. Moderate centrilobular emphysema. Right middle lobe endobronchial compression/narrowing at the level of the proximal segmental bronchi including on 68/4. This results in right middle lobe collapse. No well-defined obstructive mass. Biapical pleuroparenchymal scarring. Left greater than right base dependent compressive atelectasis. Upper Abdomen: Reflux of contrast into the hepatic veins suggests elevated right heart pressures. Artifact degradation continuing into the upper abdomen. No gross acute finding. Musculoskeletal: Lower thoracic spondylosis with S-shaped thoracolumbar spine curvature. Review of the MIP images confirms the above findings. IMPRESSION: 1.  No evidence of pulmonary embolism. 2. Mild multifocal degradation as detailed above. 3. Small bilateral pleural effusions. Findings of elevated right heart pressures. Suspect a component of fluid overload. 4. Right middle lobe endobronchial obstruction or compression at the level of the proximal subsegmental bronchi resulting in right middle lobe collapse. No well-defined obstructive mass. Consider further evaluation with bronchoscopy with attention to the right middle lobe bronchus. Alternatively, especially given patient age and comorbidities, CT follow-up at 6 months could be performed. 5. Aortic  atherosclerosis (ICD10-I70.0), coronary artery atherosclerosis and emphysema (ICD10-J43.9). Electronically Signed   By: Jeronimo Greaves M.D.   On: 10/17/2023 18:40   DG Chest 2 View Result Date: 10/17/2023 CLINICAL DATA:  Shortness of breath EXAM: CHEST - 2 VIEW COMPARISON:  Chest radiograph dated 06/26/2022 FINDINGS: Normal lung volumes. No focal consolidations. Trace blunting of bilateral costophrenic angles. No pneumothorax. Similar mildly enlarged cardiomediastinal silhouette. No acute osseous abnormality. Unchanged compression  deformity of T4. IMPRESSION: 1. Small bilateral pleural effusions. 2. Similar mild cardiomegaly. Electronically Signed   By: Agustin Cree M.D.   On: 10/17/2023 15:23       Time spent: 50 min    Sunnie Nielsen, DO [facility] 10/19/2023, 1:58 PM    Dictation software may have been used to generate the above note. Typos may occur and escape review in typed/dictated notes. Please contact Dr Lyn Hollingshead directly for clarity if needed.  Staff may message me via secure chat in Epic  but this may not receive an immediate response,  please page me for urgent matters!  If 7PM-7AM, please contact night coverage [email or web address]

## 2023-10-19 NOTE — Evaluation (Signed)
 Occupational Therapy Evaluation Patient Details Name: Angela Mason MRN: [id number] DOB: May 07, 1933 Today's Date: 10/19/2023   History of Present Illness   Pt is an 89 yo female that presented to ED for SOB, dizziness, swelling of bilateral legs. Workup showed new onset afib with RVR. PMH of HLD, HTN, COPD, macular degeneration.     Clinical Impressions Pt was seen for OT evaluation this date. Prior to hospital admission, pt was a resident of [facility] where she was MOD I with mobility using rollator and SBA for bathing tasks and assist for donning compression socks, but otherwise MOD I. Facility provides meals and meds.   Pt presents to acute OT demonstrating impaired ADL performance and functional mobility 2/2 weakness, low activity tolerance, and balance deficits. Pt currently requires CGA for STS with increased time to achieve balance d/t unsteadiness and posterior lean initially. Ambulated within the room using RW with CGA to perform standing grooming tasks at sink with constant CGA and unilateral support needed on sink to prevent balance loss. Pt dropped to 80% sp02 on RA required 1L via [place] to return to 93%.  Pt would benefit from skilled OT services to address noted impairments and functional limitations (see below for any additional details) in order to maximize safety and independence while minimizing falls risk and caregiver burden. Do anticipate the need for follow up OT services upon acute hospital DC.      If plan is discharge home, recommend the following:   A little help with walking and/or transfers;A little help with bathing/dressing/bathroom;Help with stairs or ramp for entrance;Assist for transportation     Functional Status Assessment   Patient has had a recent decline in their functional status and demonstrates the ability to make significant improvements in function in a reasonable and predictable amount of time.     Equipment Recommendations   None  recommended by OT     Recommendations for Other Services         Precautions/Restrictions   Precautions Precautions: Fall Recall of Precautions/Restrictions: Intact Restrictions Weight Bearing Restrictions Per Provider Order: No     Mobility Bed Mobility               General bed mobility comments: NT pt received in recliner    Transfers Overall transfer level: Needs assistance Equipment used: Rolling walker (2 wheels) Transfers: Sit to/from Stand Sit to Stand: Contact guard assist           General transfer comment: CGA for STS, posterior lean with increased time needed to achieve her balance, pt reporting unsteadiness throughout mobility to the sink and needed constant unilateral or bilateral support to maintain standing balance during functional tasks      Balance Overall balance assessment: Needs assistance Sitting-balance support: Feet supported Sitting balance-Leahy Scale: Good     Standing balance support: Single extremity supported, Bilateral upper extremity supported, During functional activity Standing balance-Leahy Scale: Fair Standing balance comment: dependent on RW for stability, needed UE support on sink to maintain balance during functional task                           ADL either performed or assessed with clinical judgement   ADL Overall ADL's : Needs assistance/impaired     Grooming: Oral care;Wash/dry face;Standing;Contact guard assist Grooming Details (indicate cue type and reason): at sink  Functional mobility during ADLs: Minimal assistance;Rolling walker (2 wheels);Contact guard assist       Vision         Perception         Praxis         Pertinent Vitals/Pain Pain Assessment Pain Assessment: No/denies pain     Extremity/Trunk Assessment Upper Extremity Assessment Upper Extremity Assessment: Generalized weakness   Lower Extremity Assessment Lower  Extremity Assessment: Generalized weakness   Cervical / Trunk Assessment Cervical / Trunk Assessment: Normal   Communication     Cognition Arousal: Alert Behavior During Therapy: WFL for tasks assessed/performed                                 Following commands: Intact       Cueing  General Comments   Cueing Techniques: Verbal cues  dropped to 80% on RA, improved quickly to 93% on 1L with rest/PLB   Exercises Other Exercises Other Exercises: Edu on role of OT in acute setting.   Shoulder Instructions      Home Living Family/patient expects to be discharged to:: Assisted living                             Home Equipment: Rollator (4 wheels)   Additional Comments: [facility]      Prior Functioning/Environment Prior Level of Function : Needs assist             Mobility Comments: rollator at all times ADLs Comments: facility provides meals, cleaning, standby assist for bating (does help her wash her back), assist to don compression socks    OT Problem List: Decreased strength;Decreased activity tolerance;Impaired balance (sitting and/or standing)   OT Treatment/Interventions: Self-care/ADL training;Therapeutic exercise;Therapeutic activities;Patient/family education;Energy conservation;DME and/or AE instruction;Balance training      OT Goals(Current goals can be found in the care plan section)   Acute Rehab OT Goals Patient Stated Goal: return to ALF OT Goal Formulation: With patient Time For Goal Achievement: 11/02/23 Potential to Achieve Goals: Good ADL Goals Pt Will Perform Lower Body Bathing: with contact guard assist;sitting/lateral leans;sit to/from stand Pt Will Perform Lower Body Dressing: with contact guard assist;sitting/lateral leans;sit to/from stand Pt Will Transfer to Toilet: with contact guard assist;ambulating;regular height toilet Pt Will Perform Toileting - Clothing Manipulation and hygiene: with contact  guard assist;with supervision;sit to/from stand;sitting/lateral leans   OT Frequency:  Min 2X/week    Co-evaluation              AM-PAC OT "6 Clicks" Daily Activity     Outcome Measure Help from another person eating meals?: None Help from another person taking care of personal grooming?: A Little Help from another person toileting, which includes using toliet, bedpan, or urinal?: A Little Help from another person bathing (including washing, rinsing, drying)?: A Little Help from another person to put on and taking off regular upper body clothing?: None Help from another person to put on and taking off regular lower body clothing?: A Little 6 Click Score: 20   End of Session Equipment Utilized During Treatment: Rolling walker (2 wheels);Oxygen Nurse Communication: Mobility status  Activity Tolerance: Patient tolerated treatment well Patient left: in chair;with call bell/phone within reach;with chair alarm set  OT Visit Diagnosis: Other abnormalities of gait and mobility (R26.89);Muscle weakness (generalized) (M62.81);Unsteadiness on feet (R26.81)  Time: 6045-4098 OT Time Calculation (min): 30 min Charges:  OT General Charges $OT Visit: 1 Visit OT Evaluation $OT Eval Moderate Complexity: 1 Mod OT Treatments $Self Care/Home Management : 8-22 mins Dimitriy Carreras, OTR/L 10/19/23, 12:49 PM  Angela Mason 10/19/2023, 12:43 PM

## 2023-10-19 NOTE — Progress Notes (Signed)
 PHARMACY - ANTICOAGULATION CONSULT NOTE  Pharmacy Consult for heparin Indication: atrial fibrillation  Allergies  Allergen Reactions   Gentamicin    Levofloxacin Diarrhea    Patient Measurements: Height: 5' (152.4 cm) Weight: 46.4 kg (102 lb 3.2 oz) IBW/kg (Calculated) : 45.5 Heparin Dosing Weight: 43.1 kg  Vital Signs: Temp: 97.7 F (36.5 C) (03/24 0412) Temp Source: Oral (03/24 0412) BP: 143/78 (03/24 0412) Pulse Rate: 64 (03/24 0412)  Labs: Recent Labs    10/17/23 1502 10/17/23 1638 10/17/23 1930 10/17/23 2317 10/17/23 2341 10/18/23 0250 10/18/23 1206 10/19/23 0628  HGB 10.9*  --   --   --   --  11.1*  --  10.5*  HCT 34.4*  --   --   --   --  33.9*  --  31.4*  PLT 231  --   --   --   --  208  --  238  LABPROT  --   --   --  14.8  --   --   --   --   INR  --   --   --  1.1  --   --   --   --   HEPARINUNFRC  --   --   --   --   --  0.34 0.33 0.39  CREATININE 0.62  --  0.57  --   --  0.56 0.66 1.06*  TROPONINIHS 46*   < > 54*  --  42* 36*  --   --    < > = values in this interval not displayed.    Estimated Creatinine Clearance: 26.4 mL/min (A) (by C-G formula based on SCr of 1.06 mg/dL (H)).   Medical History: Past Medical History:  Diagnosis Date   Cancer (HCC)    SKIN   COPD, mild (HCC)    Dyspnea    Edema    HLD (hyperlipidemia)    HTN (hypertension)    Hypothyroidism    Macular degeneration, bilateral    Varicosities    Assessment: 89 y/o female presenting with shortness of breath and heart palpitations. PMH significant for HTN, HLD, COPD, hypothyroidism. Found to be in new onset atrial fibrillation while in the ED. Pharmacy has been consulted to initiate heparin infusion. Per chart review, patient not on anticoagulation prior to admission.  Baseline labs: hgb 10.9, plt 231, INR pending  Goal of Therapy:  Heparin level 0.3-0.7 units/ml Monitor platelets by anticoagulation protocol: Yes  3/23 0250 HL 0.34, therapeutic x 1 3/23 1206 HL 0.33,  therapeutic x 2 3/24 0628 HL 0.39, therapeutic x 3    Plan:  Continue heparin infusion at 600 units/hr Recheck HL daily with am labs CBC daily while on heparin infusion.  Thank you for involving pharmacy in this patient's care.   Otelia Sergeant, PharmD, [facility] 10/19/2023 7:06 AM

## 2023-10-19 NOTE — Progress Notes (Signed)
 Heart Failure Navigator Progress Note  Assessed for Heart & Vascular TOC clinic readiness.  Patient does not meet criteria due to current [facility] patient.   Navigator will sign off at this time.  Roxy Horseman, RN, BSN [facility] Heart Failure Navigator Secure Chat Only

## 2023-10-19 NOTE — Plan of Care (Signed)

## 2023-10-19 NOTE — Progress Notes (Signed)
 Initial Nutrition Assessment  DOCUMENTATION CODES:   Not applicable  INTERVENTION:   -Ensure Enlive po TID, each supplement provides 350 kcal and 20 grams of protein -Magic cup TID with meals, each supplement provides 290 kcal and 9 grams of protein  -MVI with minerals daily -Liberalize diet to regular diet; per discussion with MD, can d/c fluid restriction  NUTRITION DIAGNOSIS:   Increased nutrient needs related to chronic illness as evidenced by estimated needs.  GOAL:   Patient will meet greater than or equal to 90% of their needs  MONITOR:   PO intake, Supplement acceptance  REASON FOR ASSESSMENT:   Consult Assessment of nutrition requirement/status  ASSESSMENT:   Pt with medical history significant of HTN, HLD, COPD, hypothyroidism, cystocele, who presents with shortness of breath.  Pt admitted with acute CHF and new onset a-fib.   Reviewed I/O's: +516 ml x 24 hours  UOP: 450 ml x 24 hours  Per MD notes, pt with new diagnosis of CHF.   Pt is from [facility].   Spoke with pt, who was sitting in recliner chair at time of visit. She reports very poor appetite due to "not feeling well and just laying around". Pt shares that intake has fair PTA, typically consumes 3 meals per day (Breakfast: omelette or cereal and fruit; Lunch: sandwich; Dinner: meat, starch, and vegetable). Pt likes hospital food, but complains of limited variety of food at ALF.   Pt denies any weight loss, except for diuresis. She reports her UBW is around 95# and suspects that she has returned to this weight today. No wt loss noted since 6 months.   Discussed importance of good meal and supplement intake to promote healing. Pt amenable to continue supplements.   Pt on a regular diet with 1.2 L fluid restriction. Discussed cased with MD and cardiology; received permission to d/c fluid restriction.   Medications reviewed and include solu-medrol, lovaza, and amiodarone.   Labs reviewed: Na:  130, CBGS: 89 (inpatient orders for glycemic control are none).    NUTRITION - FOCUSED PHYSICAL EXAM:  Flowsheet Row Most Recent Value  Orbital Region No depletion  Upper Arm Region Mild depletion  Thoracic and Lumbar Region No depletion  Buccal Region No depletion  Temple Region No depletion  Clavicle Bone Region Mild depletion  Clavicle and Acromion Bone Region Mild depletion  Scapular Bone Region Mild depletion  Dorsal Hand Moderate depletion  Patellar Region Mild depletion  Anterior Thigh Region Mild depletion  Posterior Calf Region Mild depletion  Edema (RD Assessment) Mild  Hair Reviewed  Eyes Reviewed  Mouth Reviewed  Skin Reviewed  Nails Reviewed       Diet Order:   Diet Order             Diet regular Fluid consistency: Thin  Diet effective now                   EDUCATION NEEDS:   Education needs have been addressed  Skin:  Skin Assessment: Reviewed RN Assessment  Last BM:  10/19/22 (type 4)  Height:   Ht Readings from Last 1 Encounters:  10/17/23 5' (1.524 m)    Weight:   Wt Readings from Last 1 Encounters:  10/19/23 46.4 kg    Ideal Body Weight:  45.5 kg  BMI:  Body mass index is 19.96 kg/m.  Estimated Nutritional Needs:   Kcal:  1400-1600  Protein:  70-85 grams  Fluid:  1.4-1.6 L    Tacha Manni W,  RD, LDN, CDCES Registered Dietitian III Certified Diabetes Care and Education Specialist If unable to reach this RD, please use "RD Inpatient" group chat on secure chat between hours of 8am-4 pm daily

## 2023-10-20 DIAGNOSIS — I4891 Unspecified atrial fibrillation: Secondary | ICD-10-CM | POA: Diagnosis not present

## 2023-10-20 LAB — CBC
HCT: 30.5 % — ABNORMAL LOW (ref 36.0–46.0)
Hemoglobin: 10.3 g/dL — ABNORMAL LOW (ref 12.0–15.0)
MCH: 32.8 pg (ref 26.0–34.0)
MCHC: 33.8 g/dL (ref 30.0–36.0)
MCV: 97.1 fL (ref 80.0–100.0)
Platelets: 231 10*3/uL (ref 150–400)
RBC: 3.14 MIL/uL — ABNORMAL LOW (ref 3.87–5.11)
RDW: 13.9 % (ref 11.5–15.5)
WBC: 11.3 10*3/uL — ABNORMAL HIGH (ref 4.0–10.5)
nRBC: 0.3 % — ABNORMAL HIGH (ref 0.0–0.2)

## 2023-10-20 LAB — BASIC METABOLIC PANEL
Anion gap: 9 (ref 5–15)
BUN: 49 mg/dL — ABNORMAL HIGH (ref 8–23)
CO2: 27 mmol/L (ref 22–32)
Calcium: 8.6 mg/dL — ABNORMAL LOW (ref 8.9–10.3)
Chloride: 92 mmol/L — ABNORMAL LOW (ref 98–111)
Creatinine, Ser: 0.83 mg/dL (ref 0.44–1.00)
GFR, Estimated: >60 mL/min (ref >60)
Glucose, Bld: 145 mg/dL — ABNORMAL HIGH (ref 70–99)
Potassium: 4.4 mmol/L (ref 3.5–5.1)
Sodium: 128 mmol/L — ABNORMAL LOW (ref 135–145)

## 2023-10-20 NOTE — TOC Progression Note (Addendum)
 Transition of Care [facility]) - Progression Note    Patient Details  Name: Angela Mason MRN: [id number] Date of Birth: February 05, 1933  [facility]) CM/SW Contact  Truddie Hidden, RN Phone Number: 10/20/2023, 4:10 PM  Clinical Narrative:    Attempt to contact patient's daughter, Angela Mason who has a [place] cell phone number. Call unable to connect.   Attempt to contact patient's friend, Angela Mason. No answer. Left a message.  4:19pm Retrieved call from patient's friend Angela Mason. Patient is from [facility]. Patient's daughter is expected to be in town tonight at [employer]. She will come here to [facility]. Angela Mason provided phone number for [name] at [facility] @ [phone]. Patient has  CNA at the facility that provides care.    Expected Discharge Plan: Home/Self Care Barriers to Discharge: Continued Medical Work up  Expected Discharge Plan and Services                                               Social Determinants of Health (SDOH) Interventions SDOH Screenings   Food Insecurity: No Food Insecurity (10/18/2023)  Housing: Low Risk  (10/18/2023)  Transportation Needs: No Transportation Needs (10/18/2023)  Utilities: Not At Risk (10/18/2023)  Depression (PHQ2-9): Low Risk  (01/14/2023)  Social Connections: Socially Isolated (10/18/2023)  Tobacco Use: Medium Risk (10/17/2023)    Readmission Risk Interventions     No data to display

## 2023-10-20 NOTE — Care Management Important Message (Signed)
 Important Message  Patient Details  Name: Angela Mason MRN: [id number] Date of Birth: 01/29/1933   Important Message Given:  Yes - Medicare IM     Cristela Blue, CMA 10/20/2023, 12:14 PM

## 2023-10-20 NOTE — TOC Progression Note (Addendum)
 Transition of Care [facility]) - Progression Note    Patient Details  Name: Angela Mason MRN: [id number] Date of Birth: [birth year]  [facility]) CM/SW Contact  Truddie Hidden, RN Phone Number: 10/20/2023, 1:05 PM  Clinical Narrative:   Spoke with patient regarding therapy's recommendation for [facility]. She does not wish to have HH PT. She stated she lives at [facility] and will used their therapy. Patient stated her  daughter will be in town to assist with care as well.     Expected Discharge Plan: Home/Self Care Barriers to Discharge: Continued Medical Work up  Expected Discharge Plan and Services                                               Social Determinants of Health (SDOH) Interventions SDOH Screenings   Food Insecurity: No Food Insecurity (10/18/2023)  Housing: Low Risk  (10/18/2023)  Transportation Needs: No Transportation Needs (10/18/2023)  Utilities: Not At Risk (10/18/2023)  Depression (PHQ2-9): Low Risk  (01/14/2023)  Social Connections: Socially Isolated (10/18/2023)  Tobacco Use: Medium Risk (10/17/2023)    Readmission Risk Interventions     No data to display

## 2023-10-20 NOTE — Progress Notes (Signed)
 [facility] Cardiology  CARDIOLOGY PROGRESS NOTE  [name] MRN: [id number] DOB/AGE: Sep 01, 1932 89 y.o.  Admit date: 10/17/2023 Referring Physician Dr. Sunnie Nielsen Primary Physician [name], MD  Primary Cardiologist None Reason for Consultation AF RVR  HPI: No complaints this morning.  Denies chest pain or shortness of breath. Confused overnight  Review of systems complete and found to be negative unless listed above     Past Medical History:  Diagnosis Date   Cancer (HCC)    SKIN   COPD, mild (HCC)    Dyspnea    Edema    HLD (hyperlipidemia)    HTN (hypertension)    Hypothyroidism    Macular degeneration, bilateral    Varicosities     Past Surgical History:  Procedure Laterality Date   CATARACT EXTRACTION W/PHACO Right 02/03/2017   Procedure: CATARACT EXTRACTION PHACO AND INTRAOCULAR LENS PLACEMENT (IOC);  Surgeon: Galen Manila, MD;  Location: [facility] ORS;  Service: Ophthalmology;  Laterality: Right;  [place] 00:57.2 AP% 18.5 CDE 10.58 Fluid pack lot # [id number]   CATARACT EXTRACTION W/PHACO Left 02/24/2017   Procedure: CATARACT EXTRACTION PHACO AND INTRAOCULAR LENS PLACEMENT (IOC);  Surgeon: Galen Manila, MD;  Location: [facility] ORS;  Service: Ophthalmology;  Laterality: Left;  [place] 01:12 AP% 25.0 CDE 18.03 Fluid pack lot # [id number]   MOHS SURGERY  2015   nose   SALIVARY GLAND SURGERY Left 2012   TONSILLECTOMY  1940    Medications Prior to Admission  Medication Sig Dispense Refill Last Dose/Taking   acetaminophen (TYLENOL) 325 MG tablet Take 650 mg by mouth every 4 (four) hours as needed.   Taking As Needed   aluminum-magnesium hydroxide 200-200 MG/5ML suspension Take 5 mLs by mouth every 4 (four) hours as needed for indigestion.   Taking As Needed   ARIPiprazole (ABILIFY) 2 MG tablet Take 1 tablet (2 mg total) by mouth daily with breakfast. 45 tablet 0 10/17/2023 Morning   bismuth subsalicylate (PEPTO BISMOL) 262 MG/15ML suspension Take 30 mLs by  mouth as needed.   Taking As Needed   dextromethorphan-guaiFENesin (ROBITUSSIN-DM) 10-100 MG/5ML liquid Take 5 mLs by mouth every 4 (four) hours as needed for cough.   Taking As Needed   dextrose (GLUTOSE) 40 % GEL Take 1 Tube by mouth as needed for low blood sugar.   Taking As Needed   erythromycin ophthalmic ointment Place 1 Application into both eyes at bedtime.   10/16/2023 Bedtime   estradiol (ESTRACE) 0.1 MG/GM vaginal cream Place 1 Applicatorful vaginally 2 (two) times a week. 42.5 g 1 10/16/2023 Bedtime   losartan (COZAAR) 50 MG tablet Take 1 tablet (50 mg total) by mouth daily. 90 tablet 3 10/17/2023 Morning   lovastatin (MEVACOR) 20 MG tablet Take 1 tablet (20 mg total) by mouth at bedtime. 90 tablet 3 10/16/2023 Bedtime   magnesium hydroxide (MILK OF MAGNESIA) 400 MG/5ML suspension Take 5 mLs by mouth daily as needed for mild constipation.   Taking As Needed   Multiple Vitamins-Minerals (PRESERVISION AREDS) TABS Take 1 capsule by mouth 2 (two) times daily.   10/17/2023 Morning   nystatin (MYCOSTATIN/NYSTOP) powder Apply 1 Application topically 2 (two) times daily.   10/17/2023 Morning   Omega-3 Fatty Acids (FISH OIL CONCENTRATE PO) Take 1 tablet by mouth daily.    10/17/2023 Morning   Polyvinyl Alcohol-Povidone PF (REFRESH) 1.4-0.6 % SOLN Place 1 drop into both eyes in the morning and at bedtime.   10/17/2023   chlorhexidine (PERIDEX) 0.12 % solution Give 0.5 applicatorful  by mouth two times a day for crown lengthening procedure unsupervised self-administration after breakfast and before bed (Patient not taking: Reported on 10/17/2023)   Not Taking   OXYGEN 2 lpm for dyspnea or SOB      Social History   Socioeconomic History   Marital status: Widowed    Spouse name: Not on file   Number of children: Not on file   Years of education: Not on file   Highest education level: Not on file  Occupational History   Not on file  Tobacco Use   Smoking status: Former    Current packs/day: 0.00     Types: Cigarettes    Quit date: 09/05/2005    Years since quitting: 18.1   Smokeless tobacco: Never  Vaping Use   Vaping status: Never Used  Substance and Sexual Activity   Alcohol use: Not Currently   Drug use: No   Sexual activity: Not Currently    Birth control/protection: Post-menopausal  Other Topics Concern   Not on file  Social History Narrative   Not on file   Social Drivers of Health   Financial Resource Strain: Not on file  Food Insecurity: No Food Insecurity (10/18/2023)   Hunger Vital Sign    Worried About Running Out of Food in the Last Year: Never true    Ran Out of Food in the Last Year: Never true  Transportation Needs: No Transportation Needs (10/18/2023)   PRAPARE - Administrator, Civil Service (Medical): No    Lack of Transportation (Non-Medical): No  Physical Activity: Not on file  Stress: Not on file  Social Connections: Socially Isolated (10/18/2023)   Social Connection and Isolation Panel [NHANES]    Frequency of Communication with Friends and Family: More than three times a week    Frequency of Social Gatherings with Friends and Family: Twice a week    Attends Religious Services: Never    Database administrator or Organizations: No    Attends Banker Meetings: Never    Marital Status: Widowed  Intimate Partner Violence: Not At Risk (10/18/2023)   Humiliation, Afraid, Rape, and Kick questionnaire    Fear of Current or Ex-Partner: No    Emotionally Abused: No    Physically Abused: No    Sexually Abused: No    Family History  Problem Relation Age of Onset   Diverticulitis Mother    Macular degeneration Mother    COPD Father    Heart failure Father    Epilepsy Sister    Parkinson's disease Brother    Cancer Neg Hx    Diabetes Neg Hx    Breast cancer Neg Hx       Review of systems complete and found to be negative unless listed above      PHYSICAL EXAM  Regular rhythm No pedal edema No wheeze or  crackles  Labs:   Lab Results  Component Value Date   WBC 11.3 (H) 10/20/2023   HGB 10.3 (L) 10/20/2023   HCT 30.5 (L) 10/20/2023   MCV 97.1 10/20/2023   PLT 231 10/20/2023    Recent Labs  Lab 10/17/23 1502 10/17/23 1930 10/20/23 0600  NA 129*   < > 128*  K 4.2   < > 4.4  CL 95*   < > 92*  CO2 26   < > 27  BUN 21   < > 49*  CREATININE 0.62   < > 0.83  CALCIUM 7.9*   < >  8.6*  PROT 6.4*  --   --   BILITOT 0.9  --   --   ALKPHOS 70  --   --   ALT 39  --   --   AST 30  --   --   GLUCOSE 142*   < > 145*   < > = values in this interval not displayed.   No results found for: "CKTOTAL", "CKMB", "CKMBINDEX", "TROPONINI"  Lab Results  Component Value Date   CHOL 195 07/02/2023   CHOL 181 11/03/2022   CHOL 175 05/01/2022   Lab Results  Component Value Date   HDL 68 07/02/2023   HDL 74 (A) 11/03/2022   HDL 64 05/01/2022   Lab Results  Component Value Date   LDLCALC 108 07/02/2023   LDLCALC 88 11/03/2022   LDLCALC 91 05/01/2022   Lab Results  Component Value Date   TRIG 101 11/03/2022   TRIG 103 05/01/2022   No results found for: "CHOLHDL" No results found for: "LDLDIRECT"    Radiology: ECHOCARDIOGRAM COMPLETE Result Date: 10/18/2023    ECHOCARDIOGRAM REPORT   Patient Name:   Angela Mason Date of Exam: 10/18/2023 Medical Rec #:  [id number]       Height:       60.0 in Accession #:    [id number]      Weight:       95.0 lb Date of Birth:  11-Aug-1932       BSA:          1.360 m Patient Age:    89 years        BP:           118/83 mmHg Patient Gender: F               HR:           116 bpm. Exam Location:  [facility] Procedure: 2D Echo, Cardiac Doppler and Color Doppler (Both Spectral and Color            Flow Doppler were utilized during procedure). Indications:     CHF- Acute Diastolic I50.31  History:         Patient has no prior history of Echocardiogram examinations.  Sonographer:     Elwin Sleight RDCS Referring Phys:  Wynona Neat NIU Diagnosing Phys: Adrian Blackwater  Sonographer  Comments: Image acquisition challenging due to respiratory motion. IMPRESSIONS  1. Left ventricular ejection fraction, by estimation, is 45 to 50%. The left ventricle has mildly decreased function. The left ventricle demonstrates global hypokinesis. The left ventricular internal cavity size was moderately dilated. There is mild concentric left ventricular hypertrophy. Left ventricular diastolic parameters are indeterminate.  2. Right ventricular systolic function is moderately reduced. The right ventricular size is moderately enlarged. Mildly increased right ventricular wall thickness.  3. Left atrial size was severely dilated.  4. Right atrial size was severely dilated.  5. The mitral valve is grossly normal. Moderate mitral valve regurgitation.  6. Tricuspid valve regurgitation is mild to moderate.  7. The aortic valve is calcified. Aortic valve regurgitation is not visualized. Aortic valve sclerosis/calcification is present, without any evidence of aortic stenosis. FINDINGS  Left Ventricle: Left ventricular ejection fraction, by estimation, is 45 to 50%. The left ventricle has mildly decreased function. The left ventricle demonstrates global hypokinesis. Strain was performed and the global longitudinal strain is indeterminate. The left ventricular internal cavity size was moderately dilated. There is mild concentric left ventricular hypertrophy. Left ventricular diastolic parameters are indeterminate.  Right Ventricle: The right ventricular size is moderately enlarged. Mildly increased right ventricular wall thickness. Right ventricular systolic function is moderately reduced. Left Atrium: Left atrial size was severely dilated. Right Atrium: Right atrial size was severely dilated. Pericardium: There is no evidence of pericardial effusion. Mitral Valve: The mitral valve is grossly normal. Moderate mitral valve regurgitation. Tricuspid Valve: The tricuspid valve is grossly normal. Tricuspid valve regurgitation is  mild to moderate. Aortic Valve: The aortic valve is calcified. Aortic valve regurgitation is not visualized. Aortic valve sclerosis/calcification is present, without any evidence of aortic stenosis. Aortic valve peak gradient measures 10.6 mmHg. Pulmonic Valve: The pulmonic valve was grossly normal. Pulmonic valve regurgitation is mild. Aorta: The aortic root, ascending aorta and aortic arch are all structurally normal, with no evidence of dilitation or obstruction. IAS/Shunts: No atrial level shunt detected by color flow Doppler. Additional Comments: 3D was performed not requiring image post processing on an independent workstation and was indeterminate.  LEFT VENTRICLE PLAX 2D LVIDd:         3.20 cm   Diastology LVIDs:         2.40 cm   LV e' lateral: 12.40 cm/s LV PW:         1.10 cm LV IVS:        1.00 cm LVOT diam:     1.90 cm LV SV:         29 LV SV Index:   22 LVOT Area:     2.84 cm  RIGHT VENTRICLE RV Basal diam:  2.80 cm LEFT ATRIUM             Index        RIGHT ATRIUM           Index LA diam:        3.50 cm 2.57 cm/m   RA Area:     17.00 cm LA Vol (A2C):   32.7 ml 24.04 ml/m  RA Volume:   48.00 ml  35.29 ml/m LA Vol (A4C):   39.2 ml 28.82 ml/m LA Biplane Vol: 39.8 ml 29.26 ml/m  AORTIC VALVE                 PULMONIC VALVE AV Area (Vmax): 1.29 cm     PV Vmax:        0.64 m/s AV Vmax:        162.50 cm/s  PV Peak grad:   1.6 mmHg AV Peak Grad:   10.6 mmHg    RVOT Peak grad: 1 mmHg LVOT Vmax:      73.90 cm/s LVOT Vmean:     50.100 cm/s LVOT VTI:       0.104 m  AORTA Ao Root diam: 2.80 cm TRICUSPID VALVE TR Peak grad:   33.6 mmHg TR Vmax:        290.00 cm/s  SHUNTS Systemic VTI:  0.10 m Systemic Diam: 1.90 cm Adrian Blackwater Electronically signed by Adrian Blackwater Signature Date/Time: 10/18/2023/12:36:21 PM    Final    CT Angio Chest PE W and/or Wo Contrast Result Date: 10/17/2023 CLINICAL DATA:  Shortness of breath.  History of COPD.  Ex-smoker. EXAM: CT ANGIOGRAPHY CHEST WITH CONTRAST TECHNIQUE:  Multidetector CT imaging of the chest was performed using the standard protocol during bolus administration of intravenous contrast. Multiplanar CT image reconstructions and MIPs were obtained to evaluate the vascular anatomy. RADIATION DOSE REDUCTION: This exam was performed according to the departmental dose-optimization program which includes automated exposure control, adjustment of  the mA and/or kV according to patient size and/or use of iterative reconstruction technique. CONTRAST:  60mL OMNIPAQUE IOHEXOL 350 MG/ML SOLN COMPARISON:  Plain film of earlier today.  No prior CT. FINDINGS: Cardiovascular: The quality of this exam for evaluation of pulmonary embolism is good. Although the bolus is well timed, limitations include minimal motion, EKG wire artifact and patient arm position, not raised above the head. No evidence of pulmonary embolism. Advanced aortic and branch vessel atherosclerosis. The aorta is not well opacified secondary to bolus timing. Moderate cardiomegaly, without pericardial effusion. Left main and 3 vessel coronary artery calcification. Mediastinum/Nodes: No mediastinal or hilar adenopathy. Lungs/Pleura: Small bilateral pleural effusions. Moderate centrilobular emphysema. Right middle lobe endobronchial compression/narrowing at the level of the proximal segmental bronchi including on 68/4. This results in right middle lobe collapse. No well-defined obstructive mass. Biapical pleuroparenchymal scarring. Left greater than right base dependent compressive atelectasis. Upper Abdomen: Reflux of contrast into the hepatic veins suggests elevated right heart pressures. Artifact degradation continuing into the upper abdomen. No gross acute finding. Musculoskeletal: Lower thoracic spondylosis with S-shaped thoracolumbar spine curvature. Review of the MIP images confirms the above findings. IMPRESSION: 1.  No evidence of pulmonary embolism. 2. Mild multifocal degradation as detailed above. 3. Small  bilateral pleural effusions. Findings of elevated right heart pressures. Suspect a component of fluid overload. 4. Right middle lobe endobronchial obstruction or compression at the level of the proximal subsegmental bronchi resulting in right middle lobe collapse. No well-defined obstructive mass. Consider further evaluation with bronchoscopy with attention to the right middle lobe bronchus. Alternatively, especially given patient age and comorbidities, CT follow-up at 6 months could be performed. 5. Aortic atherosclerosis (ICD10-I70.0), coronary artery atherosclerosis and emphysema (ICD10-J43.9). Electronically Signed   By: Jeronimo Greaves M.D.   On: 10/17/2023 18:40   DG Chest 2 View Result Date: 10/17/2023 CLINICAL DATA:  Shortness of breath EXAM: CHEST - 2 VIEW COMPARISON:  Chest radiograph dated 06/26/2022 FINDINGS: Normal lung volumes. No focal consolidations. Trace blunting of bilateral costophrenic angles. No pneumothorax. Similar mildly enlarged cardiomediastinal silhouette. No acute osseous abnormality. Unchanged compression deformity of T4. IMPRESSION: 1. Small bilateral pleural effusions. 2. Similar mild cardiomegaly. Electronically Signed   By: Agustin Cree M.D.   On: 10/17/2023 15:23     ASSESSMENT AND PLAN:  A-fib with RVR, now in sinus rhythm Acute heart failure with mildly reduced EF, LVEF 45 to 50% COPD Protein calorie malnutrition AKI, creatinine improving  Remains in sinus rhythm, continue p.o. amiodarone and metoprolol.  Amiodarone surveillance as outpatient. Continue low-dose Eliquis Agree to continue holding diuretics.  Likely can start low-dose p.o. Lasix from tomorrow  Signed: Kathryne Gin MD 10/20/2023, 5:40 PM

## 2023-10-20 NOTE — Plan of Care (Signed)

## 2023-10-20 NOTE — Progress Notes (Addendum)
 PROGRESS NOTE    Avenell Sellers   JXB:[id number] DOB: July 29, 1932  DOA: 10/17/2023 Date of Service: 10/20/23 which is hospital day 3  PCP: [name], MD    Hospital course / significant events:   HPI: Genevieve Arbaugh is a 89 y.o. female with medical history significant of HTN, HLD, COPD, hypothyroidism, cystocele, who presents with SOB x several days, worsening. Assoc w/ palpiatations, heart racing, bl le edema.   03/22: admitted to hospitalist for CHF, Afib, Lung RML obstrcution/collapse noted on CTA chest, PE r/o, also w/ acute hypoxic resp fail. Requiring O2, amiodarone infusion, heparin infusion. Cardiology consulted.  03/23: no output documented but pt reports strong UOP. Breathign is about the same. Continuing diuresis. Await echo report. Added beta blocker, rate improved. Continuing on amiodarone infusion for now. Further recs per cardiology / pending Echo --> Echo EF 45-50%, mild decreased LV fxn, LV global hypokinesis, indeterminate diastolic parameters, RV fxn moderately reduced, LA and RA severely dilated, moderate MR, mild-mod TR, Aortic valve calcification w/o stenosis.  03/24: d/w pulmonary, may f/u in office re: RML question obstruction. Coming off IV infusions and onto po meds.  03/25: confusion w/ sitter overnight, TOC asked to confirm if she is able to go back to her facility, they are awaiting answer on this from [facility]. Daughter will be in town this evening/tomorrow (coming in from [place])     Consultants:  Cardiology   Procedures/Surgeries: none      ASSESSMENT & PLAN:   Acute CHF - HFpEF Diuresis resumes (was helpd for mild increase Cr) continue holding for now appears euvolemic Continue Metoprolol, Farxiga, statin  Daily weight, I&O, fluid restriction diet  Cardiology following     New onset atrial fibrillation with RVR - resolved Paroxysmal Afib now in sinus  CHADS2 score is 5, will need chronic anticoagulants.   TSH and T4 normal. IV  heparin drip --> eliquis  Amiodarone drip --> po  Metroprolol Cardiology following     COPD exacerbation  wheezing on auscultation, emphysema on CTA chest  Bronchodilators Doxycycline 100 mg twice daily Solu-Medrol 40 mg twice daily  As needed Mucinex   Acute respiratory failure with hypoxia  combination of acute CHF and COPD exacerbation. Trest underlying cause(s) as above Bronchodilators and as needed Mucinex Nasal cannula oxygen to maintain oxygen saturation above 93%  Hyponatremia Likely d/t fluid overload CHF Fluid restriction  Follow BMP   HLD (hyperlipidemia) CAD, Aortic Atherosclerosis Pravastatin (patient is on lovastatin at home), consider escalation given CAD/atherosclerosis noted on CT   Protein-calorie malnutrition, severe: Body weight 43.1 kg, BMI 18.55 Ensure Dietician consult   Right middle lobe collapse:  this is incidental finding by CTA. CTA showed right middle lobe endobronchial obstruction or compression at the level of the proximal subsegmental bronchi resulting in right middle lobe collapse. No well-defined obstructive mass.  D/w pulmonology for possible bronchoscopy or alternatively follow-up CT scan in 3-6 months --> plan to follow in office, Dr Belia Heman to arrange        underweight based on BMI: Body mass index is 18.55 kg/m.  Underweight - under 18  overweight - 25 to 29 obese - 30 or more Class 1 obesity: BMI of 30.0 to 34 Class 2 obesity: BMI of 35.0 to 39 Class 3 obesity: BMI of 40.0 to 49 Super Morbid Obesity: BMI 50-59 Super-super Morbid Obesity: BMI 60+ Significantly low or high BMI is associated with higher medical risk.  Weight management advised as adjunct to other disease management and risk  reduction treatments    DVT prophylaxis: heparin --> eliquis  IV fluids: no continuous IV fluids  Nutrition: regular diet w/ fluid restriction Central lines / other devices: none  Code Status: FULL CODE ACP documentation reviewed: none  on file in [facility]  TOC needs: back to facility  Medical barriers to dispo: medically stable as of today             Subjective / Brief ROS:  Patient reports fatigue/generalized weakness about same as yesterday SHe is oriented to person/place/time. She remmebers she came to hospital for breathing trouble but she cannot recall Afib diagnosis unprompted   Denies CP/SOB at rest Pain controlled.  Denies new weakness.  Tolerating diet Reports no concerns w/ urination/defecation.   Family Communication: none at this time - daughter coming over from [place] per patient     Objective Findings:  Vitals:   10/20/23 0836 10/20/23 1002 10/20/23 1243 10/20/23 1540  BP: 130/73  119/68 113/62  Pulse:  67  (!) 58  Resp:      Temp: 97.7 F (36.5 C)  (!) 97.4 F (36.3 C) 98 F (36.7 C)  TempSrc: Oral  Oral   SpO2: 98%  99% 96%  Weight:      Height:        Intake/Output Summary (Last 24 hours) at 10/20/2023 1620 Last data filed at 10/20/2023 1435 Gross per 24 hour  Intake 430.15 ml  Output 850 ml  Net -419.85 ml   Filed Weights   10/17/23 1445 10/19/23 0500 10/20/23 0500  Weight: 43.1 kg 46.4 kg 47.9 kg    Examination:  Physical Exam Constitutional:      General: She is not in acute distress. Cardiovascular:     Rate and Rhythm: Normal rate and regular rhythm.  Pulmonary:     Effort: Pulmonary effort is normal.     Breath sounds: Decreased breath sounds present. No wheezing or rales.  Musculoskeletal:     Right lower leg: No edema.  Neurological:     General: No focal deficit present.     Mental Status: She is alert and oriented to person, place, and time.     Comments: SHe is oriented to person/place/time. She remmebers she came to hospital for breathing trouble but she cannot recall Afib diagnosis unprompted    Psychiatric:        Mood and Affect: Mood normal.        Behavior: Behavior normal.          Scheduled Medications:   amiodarone  400 mg Oral  BID   Followed by   Melene Muller ON 10/29/2023] amiodarone  200 mg Oral Daily   apixaban  2.5 mg Oral BID   ARIPiprazole  2 mg Oral Q breakfast   aspirin EC  81 mg Oral Daily   dapagliflozin propanediol  10 mg Oral Daily   doxycycline  100 mg Oral Q12H   erythromycin  1 Application Both Eyes QHS   feeding supplement  237 mL Oral TID BM   methylPREDNISolone (SOLU-MEDROL) injection  40 mg Intravenous Q12H   metoprolol succinate  50 mg Oral BID   multivitamin  1 tablet Oral BID   nystatin  1 Application Topical BID   omega-3 acid ethyl esters  1 g Oral Daily   polyvinyl alcohol  1 drop Both Eyes QHS   pravastatin  20 mg Oral q1800    Continuous Infusions:    PRN Medications:  acetaminophen, albuterol, alum & mag hydroxide-simeth, dextromethorphan-guaiFENesin, hydrALAZINE,  ipratropium, LORazepam, magnesium hydroxide, ondansetron (ZOFRAN) IV, mouth rinse  Antimicrobials from admission:  Anti-infectives (From admission, onward)    Start     Dose/Rate Route Frequency Ordered Stop   10/17/23 2215  doxycycline (VIBRA-TABS) tablet 100 mg        100 mg Oral Every 12 hours 10/17/23 2212             Data Reviewed:  I have personally reviewed the following...  CBC: Recent Labs  Lab 10/17/23 1502 10/18/23 0250 10/19/23 0628 10/20/23 0600  WBC 11.7* 11.4* 13.3* 11.3*  HGB 10.9* 11.1* 10.5* 10.3*  HCT 34.4* 33.9* 31.4* 30.5*  MCV 101.8* 98.5 96.6 97.1  PLT 231 208 238 231   Basic Metabolic Panel: Recent Labs  Lab 10/17/23 1638 10/17/23 1930 10/18/23 0250 10/18/23 1206 10/19/23 0628 10/20/23 0600  NA  --  131* 132* 133* 130* 128*  K  --  3.9 3.8 4.8 4.5 4.4  CL  --  94* 96* 98 94* 92*  CO2  --  27 26 27 26 27   GLUCOSE  --  121* 147* 172* 148* 145*  BUN  --  19 19 24* 41* 49*  CREATININE  --  0.57 0.56 0.66 1.06* 0.83  CALCIUM  --  8.1* 8.1* 8.1* 8.4* 8.6*  MG 2.3  --   --   --   --   --   PHOS 3.2  --   --   --   --   --    GFR: Estimated Creatinine Clearance: 33.7  mL/min (by C-G formula based on SCr of 0.83 mg/dL). Liver Function Tests: Recent Labs  Lab 10/17/23 1502  AST 30  ALT 39  ALKPHOS 70  BILITOT 0.9  PROT 6.4*  ALBUMIN 3.2*   No results for input(s): "LIPASE", "AMYLASE" in the last 168 hours. No results for input(s): "AMMONIA" in the last 168 hours. Coagulation Profile: Recent Labs  Lab 10/17/23 2317  INR 1.1   Cardiac Enzymes: No results for input(s): "CKTOTAL", "CKMB", "CKMBINDEX", "TROPONINI" in the last 168 hours. BNP (last 3 results) No results for input(s): "PROBNP" in the last 8760 hours. HbA1C: No results for input(s): "HGBA1C" in the last 72 hours. CBG: No results for input(s): "GLUCAP" in the last 168 hours. Lipid Profile: No results for input(s): "CHOL", "HDL", "LDLCALC", "TRIG", "CHOLHDL", "LDLDIRECT" in the last 72 hours. Thyroid Function Tests: Recent Labs    10/17/23 1638  FREET4 0.88   Anemia Panel: No results for input(s): "VITAMINB12", "FOLATE", "FERRITIN", "TIBC", "IRON", "RETICCTPCT" in the last 72 hours. Most Recent Urinalysis On File:     Component Value Date/Time   COLORURINE YELLOW (A) 01/28/2022 1254   APPEARANCEUR Hazy (A) 02/25/2023 1316   LABSPEC 1.011 01/28/2022 1254   PHURINE 5.0 01/28/2022 1254   GLUCOSEU Negative 02/25/2023 1316   HGBUR MODERATE (A) 01/28/2022 1254   BILIRUBINUR Negative 02/25/2023 1316   KETONESUR NEGATIVE 01/28/2022 1254   PROTEINUR Negative 02/25/2023 1316   PROTEINUR NEGATIVE 01/28/2022 1254   UROBILINOGEN 0.2 09/19/2022 1544   NITRITE Negative 02/25/2023 1316   NITRITE POSITIVE (A) 01/28/2022 1254   LEUKOCYTESUR 2+ (A) 02/25/2023 1316   LEUKOCYTESUR LARGE (A) 01/28/2022 1254   Sepsis Labs: @LABRCNTIP (procalcitonin:4,lacticidven:4) Microbiology: Recent Results (from the past 240 hours)  SARS Coronavirus 2 by RT PCR (hospital order, performed in [facility] hospital lab) *cepheid single result test* Nasopharyngeal Swab     Status: None   Collection Time:  10/17/23  7:30 PM   Specimen: Nasopharyngeal  Swab; Nasal Swab  Result Value Ref Range Status   SARS Coronavirus 2 by RT PCR NEGATIVE NEGATIVE Final    Comment: (NOTE) SARS-CoV-2 target nucleic acids are NOT DETECTED.  The SARS-CoV-2 RNA is generally detectable in upper and lower respiratory specimens during the acute phase of infection. The lowest concentration of SARS-CoV-2 viral copies this assay can detect is 250 copies / mL. A negative result does not preclude SARS-CoV-2 infection and should not be used as the sole basis for treatment or other patient management decisions.  A negative result may occur with improper specimen collection / handling, submission of specimen other than nasopharyngeal swab, presence of viral mutation(s) within the areas targeted by this assay, and inadequate number of viral copies (<250 copies / mL). A negative result must be combined with clinical observations, patient history, and epidemiological information.  Fact Sheet for Patients:   RoadLapTop.co.za  Fact Sheet for Healthcare Providers: http://kim-miller.com/  This test is not yet approved or  cleared by the [place] FDA and has been authorized for detection and/or diagnosis of SARS-CoV-2 by FDA under an Emergency Use Authorization (EUA).  This EUA will remain in effect (meaning this test can be used) for the duration of the COVID-19 declaration under Section 564(b)(1) of the Act, 21 U.S.C. section 360bbb-3(b)(1), unless the authorization is terminated or revoked sooner.  Performed at [facility], [address] [place], [place] [address]   Respiratory (~20 pathogens) panel by PCR     Status: None   Collection Time: 10/17/23  7:30 PM   Specimen: Nasopharyngeal Swab; Respiratory  Result Value Ref Range Status   Adenovirus NOT DETECTED NOT DETECTED Final   Coronavirus 229E NOT DETECTED NOT DETECTED Final    Comment: (NOTE) The  Coronavirus on the Respiratory Panel, DOES NOT test for the novel  Coronavirus (2019 nCoV)    Coronavirus HKU1 NOT DETECTED NOT DETECTED Final   Coronavirus NL63 NOT DETECTED NOT DETECTED Final   Coronavirus OC43 NOT DETECTED NOT DETECTED Final   Metapneumovirus NOT DETECTED NOT DETECTED Final   Rhinovirus / Enterovirus NOT DETECTED NOT DETECTED Final   Influenza A NOT DETECTED NOT DETECTED Final   Influenza B NOT DETECTED NOT DETECTED Final   Parainfluenza Virus 1 NOT DETECTED NOT DETECTED Final   Parainfluenza Virus 2 NOT DETECTED NOT DETECTED Final   Parainfluenza Virus 3 NOT DETECTED NOT DETECTED Final   Parainfluenza Virus 4 NOT DETECTED NOT DETECTED Final   Respiratory Syncytial Virus NOT DETECTED NOT DETECTED Final   Bordetella pertussis NOT DETECTED NOT DETECTED Final   Bordetella Parapertussis NOT DETECTED NOT DETECTED Final   Chlamydophila pneumoniae NOT DETECTED NOT DETECTED Final   Mycoplasma pneumoniae NOT DETECTED NOT DETECTED Final    Comment: Performed at [facility], [address] [place], [place] [address]      Radiology Studies last 3 days: ECHOCARDIOGRAM COMPLETE Result Date: 10/18/2023    ECHOCARDIOGRAM REPORT   Patient Name:   RITI ROLLYSON Date of Exam: 10/18/2023 Medical Rec #:  [id number]       Height:       60.0 in Accession #:    [id number]      Weight:       95.0 lb Date of Birth:  1933-04-09       BSA:          1.360 m Patient Age:    89 years        BP:  118/83 mmHg Patient Gender: F               HR:           116 bpm. Exam Location:  [facility] Procedure: 2D Echo, Cardiac Doppler and Color Doppler (Both Spectral and Color            Flow Doppler were utilized during procedure). Indications:     CHF- Acute Diastolic I50.31  History:         Patient has no prior history of Echocardiogram examinations.  Sonographer:     Elwin Sleight RDCS Referring Phys:  Wynona Neat NIU Diagnosing Phys: Adrian Blackwater  Sonographer Comments: Image acquisition challenging  due to respiratory motion. IMPRESSIONS  1. Left ventricular ejection fraction, by estimation, is 45 to 50%. The left ventricle has mildly decreased function. The left ventricle demonstrates global hypokinesis. The left ventricular internal cavity size was moderately dilated. There is mild concentric left ventricular hypertrophy. Left ventricular diastolic parameters are indeterminate.  2. Right ventricular systolic function is moderately reduced. The right ventricular size is moderately enlarged. Mildly increased right ventricular wall thickness.  3. Left atrial size was severely dilated.  4. Right atrial size was severely dilated.  5. The mitral valve is grossly normal. Moderate mitral valve regurgitation.  6. Tricuspid valve regurgitation is mild to moderate.  7. The aortic valve is calcified. Aortic valve regurgitation is not visualized. Aortic valve sclerosis/calcification is present, without any evidence of aortic stenosis. FINDINGS  Left Ventricle: Left ventricular ejection fraction, by estimation, is 45 to 50%. The left ventricle has mildly decreased function. The left ventricle demonstrates global hypokinesis. Strain was performed and the global longitudinal strain is indeterminate. The left ventricular internal cavity size was moderately dilated. There is mild concentric left ventricular hypertrophy. Left ventricular diastolic parameters are indeterminate. Right Ventricle: The right ventricular size is moderately enlarged. Mildly increased right ventricular wall thickness. Right ventricular systolic function is moderately reduced. Left Atrium: Left atrial size was severely dilated. Right Atrium: Right atrial size was severely dilated. Pericardium: There is no evidence of pericardial effusion. Mitral Valve: The mitral valve is grossly normal. Moderate mitral valve regurgitation. Tricuspid Valve: The tricuspid valve is grossly normal. Tricuspid valve regurgitation is mild to moderate. Aortic Valve: The aortic  valve is calcified. Aortic valve regurgitation is not visualized. Aortic valve sclerosis/calcification is present, without any evidence of aortic stenosis. Aortic valve peak gradient measures 10.6 mmHg. Pulmonic Valve: The pulmonic valve was grossly normal. Pulmonic valve regurgitation is mild. Aorta: The aortic root, ascending aorta and aortic arch are all structurally normal, with no evidence of dilitation or obstruction. IAS/Shunts: No atrial level shunt detected by color flow Doppler. Additional Comments: 3D was performed not requiring image post processing on an independent workstation and was indeterminate.  LEFT VENTRICLE PLAX 2D LVIDd:         3.20 cm   Diastology LVIDs:         2.40 cm   LV e' lateral: 12.40 cm/s LV PW:         1.10 cm LV IVS:        1.00 cm LVOT diam:     1.90 cm LV SV:         29 LV SV Index:   22 LVOT Area:     2.84 cm  RIGHT VENTRICLE RV Basal diam:  2.80 cm LEFT ATRIUM             Index  RIGHT ATRIUM           Index LA diam:        3.50 cm 2.57 cm/m   RA Area:     17.00 cm LA Vol (A2C):   32.7 ml 24.04 ml/m  RA Volume:   48.00 ml  35.29 ml/m LA Vol (A4C):   39.2 ml 28.82 ml/m LA Biplane Vol: 39.8 ml 29.26 ml/m  AORTIC VALVE                 PULMONIC VALVE AV Area (Vmax): 1.29 cm     PV Vmax:        0.64 m/s AV Vmax:        162.50 cm/s  PV Peak grad:   1.6 mmHg AV Peak Grad:   10.6 mmHg    RVOT Peak grad: 1 mmHg LVOT Vmax:      73.90 cm/s LVOT Vmean:     50.100 cm/s LVOT VTI:       0.104 m  AORTA Ao Root diam: 2.80 cm TRICUSPID VALVE TR Peak grad:   33.6 mmHg TR Vmax:        290.00 cm/s  SHUNTS Systemic VTI:  0.10 m Systemic Diam: 1.90 cm Adrian Blackwater Electronically signed by Adrian Blackwater Signature Date/Time: 10/18/2023/12:36:21 PM    Final    CT Angio Chest PE W and/or Wo Contrast Result Date: 10/17/2023 CLINICAL DATA:  Shortness of breath.  History of COPD.  Ex-smoker. EXAM: CT ANGIOGRAPHY CHEST WITH CONTRAST TECHNIQUE: Multidetector CT imaging of the chest was  performed using the standard protocol during bolus administration of intravenous contrast. Multiplanar CT image reconstructions and MIPs were obtained to evaluate the vascular anatomy. RADIATION DOSE REDUCTION: This exam was performed according to the departmental dose-optimization program which includes automated exposure control, adjustment of the mA and/or kV according to patient size and/or use of iterative reconstruction technique. CONTRAST:  60mL OMNIPAQUE IOHEXOL 350 MG/ML SOLN COMPARISON:  Plain film of earlier today.  No prior CT. FINDINGS: Cardiovascular: The quality of this exam for evaluation of pulmonary embolism is good. Although the bolus is well timed, limitations include minimal motion, EKG wire artifact and patient arm position, not raised above the head. No evidence of pulmonary embolism. Advanced aortic and branch vessel atherosclerosis. The aorta is not well opacified secondary to bolus timing. Moderate cardiomegaly, without pericardial effusion. Left main and 3 vessel coronary artery calcification. Mediastinum/Nodes: No mediastinal or hilar adenopathy. Lungs/Pleura: Small bilateral pleural effusions. Moderate centrilobular emphysema. Right middle lobe endobronchial compression/narrowing at the level of the proximal segmental bronchi including on 68/4. This results in right middle lobe collapse. No well-defined obstructive mass. Biapical pleuroparenchymal scarring. Left greater than right base dependent compressive atelectasis. Upper Abdomen: Reflux of contrast into the hepatic veins suggests elevated right heart pressures. Artifact degradation continuing into the upper abdomen. No gross acute finding. Musculoskeletal: Lower thoracic spondylosis with S-shaped thoracolumbar spine curvature. Review of the MIP images confirms the above findings. IMPRESSION: 1.  No evidence of pulmonary embolism. 2. Mild multifocal degradation as detailed above. 3. Small bilateral pleural effusions. Findings of  elevated right heart pressures. Suspect a component of fluid overload. 4. Right middle lobe endobronchial obstruction or compression at the level of the proximal subsegmental bronchi resulting in right middle lobe collapse. No well-defined obstructive mass. Consider further evaluation with bronchoscopy with attention to the right middle lobe bronchus. Alternatively, especially given patient age and comorbidities, CT follow-up at 6 months could be performed. 5. Aortic atherosclerosis (ICD10-I70.0),  coronary artery atherosclerosis and emphysema (ICD10-J43.9). Electronically Signed   By: Jeronimo Greaves M.D.   On: 10/17/2023 18:40   DG Chest 2 View Result Date: 10/17/2023 CLINICAL DATA:  Shortness of breath EXAM: CHEST - 2 VIEW COMPARISON:  Chest radiograph dated 06/26/2022 FINDINGS: Normal lung volumes. No focal consolidations. Trace blunting of bilateral costophrenic angles. No pneumothorax. Similar mildly enlarged cardiomediastinal silhouette. No acute osseous abnormality. Unchanged compression deformity of T4. IMPRESSION: 1. Small bilateral pleural effusions. 2. Similar mild cardiomegaly. Electronically Signed   By: Agustin Cree M.D.   On: 10/17/2023 15:23       Time spent: 50 min    Sunnie Nielsen, DO [facility] 10/20/2023, 4:20 PM    Dictation software may have been used to generate the above note. Typos may occur and escape review in typed/dictated notes. Please contact Dr Lyn Hollingshead directly for clarity if needed.  Staff may message me via secure chat in Epic  but this may not receive an immediate response,  please page me for urgent matters!  If 7PM-7AM, please contact night coverage [email or web address]

## 2023-10-21 DIAGNOSIS — I5031 Acute diastolic (congestive) heart failure: Secondary | ICD-10-CM

## 2023-10-21 DIAGNOSIS — I4891 Unspecified atrial fibrillation: Secondary | ICD-10-CM | POA: Diagnosis not present

## 2023-10-21 LAB — BASIC METABOLIC PANEL
Anion gap: 5 (ref 5–15)
BUN: 59 mg/dL — ABNORMAL HIGH (ref 8–23)
CO2: 28 mmol/L (ref 22–32)
Calcium: 8.8 mg/dL — ABNORMAL LOW (ref 8.9–10.3)
Chloride: 99 mmol/L (ref 98–111)
Creatinine, Ser: 0.94 mg/dL (ref 0.44–1.00)
GFR, Estimated: 58 mL/min — ABNORMAL LOW (ref >60)
Glucose, Bld: 144 mg/dL — ABNORMAL HIGH (ref 70–99)
Potassium: 4.5 mmol/L (ref 3.5–5.1)
Sodium: 132 mmol/L — ABNORMAL LOW (ref 135–145)

## 2023-10-21 MED ORDER — AMIODARONE HCL 200 MG PO TABS: ORAL_TABLET | ORAL | Status: DC

## 2023-10-21 MED ORDER — DOXYCYCLINE HYCLATE 100 MG PO TABS: 100.0000 mg | ORAL_TABLET | Freq: Two times a day (BID) | ORAL | Status: AC

## 2023-10-21 MED ORDER — DAPAGLIFLOZIN PROPANEDIOL 10 MG PO TABS: 10.0000 mg | ORAL_TABLET | Freq: Every day | ORAL | Status: AC

## 2023-10-21 MED ORDER — PRAVASTATIN SODIUM 20 MG PO TABS: 20.0000 mg | ORAL_TABLET | Freq: Every day | ORAL | Status: AC

## 2023-10-21 MED ORDER — METOPROLOL SUCCINATE ER 25 MG PO TB24: 12.5000 mg | ORAL_TABLET | Freq: Two times a day (BID) | ORAL | Status: DC

## 2023-10-21 MED ORDER — APIXABAN 2.5 MG PO TABS: 2.5000 mg | ORAL_TABLET | Freq: Two times a day (BID) | ORAL | Status: DC

## 2023-10-21 MED ORDER — ALBUTEROL SULFATE HFA 108 (90 BASE) MCG/ACT IN AERS: 2.0000 | INHALATION_SPRAY | RESPIRATORY_TRACT | Status: DC | PRN

## 2023-10-21 NOTE — Progress Notes (Incomplete)
 PROGRESS NOTE  Angela Mason    DOB: [birth year], 89 y.o.  ZOX:[id number]    Code Status: Full Code   DOA: 10/17/2023   LOS: 4   Brief hospital course  Angela Mason is a 89 y.o. female with medical history significant of HTN, HLD, COPD, hypothyroidism, cystocele, who presents with SOB x several days, worsening. Assoc w/ palpiatations, heart racing, bl le edema.    03/22: admitted to hospitalist for CHF, Afib, Lung RML obstrcution/collapse noted on CTA chest, PE r/o, also w/ acute hypoxic resp fail. Requiring O2, amiodarone infusion, heparin infusion. Cardiology consulted.  03/23: no output documented but pt reports strong UOP. Breathign is about the same. Continuing diuresis. Await echo report. Added beta blocker, rate improved. Continuing on amiodarone infusion for now. Further recs per cardiology / pending Echo --> Echo EF 45-50%, mild decreased LV fxn, LV global hypokinesis, indeterminate diastolic parameters, RV fxn moderately reduced, LA and RA severely dilated, moderate MR, mild-mod TR, Aortic valve calcification w/o stenosis.  03/24: d/w pulmonary, may f/u in office re: RML question obstruction. Coming off IV infusions and onto po meds.  03/25: confusion w/ sitter overnight, TOC asked to confirm if she is able to go back to her facility, they are awaiting answer on this from [facility]. Daughter will be in town this evening/tomorrow (coming in from [place])  10/21/23 -***  Assessment & Plan  Principal Problem:   Acute CHF (HCC) Active Problems:   Myocardial injury   Atrial fibrillation with RVR (HCC)   COPD exacerbation (HCC)   Acute respiratory failure with hypoxia (HCC)   HLD (hyperlipidemia)   Hypothyroidism   Hyponatremia   Protein-calorie malnutrition, severe  Acute CHF - HFpEF Diuresis resumes (was helpd for mild increase Cr) continue holding for now appears euvolemic Continue Metoprolol, Farxiga, statin  Daily weight, I&O, fluid restriction diet  Cardiology following      New onset atrial fibrillation with RVR - resolved Paroxysmal Afib now in sinus  CHADS2 score is 5, will need chronic anticoagulants.   TSH and T4 normal. IV heparin drip --> eliquis  Amiodarone drip --> po  Metroprolol Cardiology following     COPD exacerbation  wheezing on auscultation, emphysema on CTA chest  Bronchodilators Doxycycline 100 mg twice daily Solu-Medrol 40 mg twice daily  As needed Mucinex   Acute respiratory failure with hypoxia  combination of acute CHF and COPD exacerbation. Trest underlying cause(s) as above Bronchodilators and as needed Mucinex Nasal cannula oxygen to maintain oxygen saturation above 93%   Hyponatremia Likely d/t fluid overload CHF Fluid restriction  Follow BMP   HLD (hyperlipidemia) CAD, Aortic Atherosclerosis Pravastatin (patient is on lovastatin at home), consider escalation given CAD/atherosclerosis noted on CT   Protein-calorie malnutrition, severe: Body weight 43.1 kg, BMI 18.55 Ensure Dietician consult   Right middle lobe collapse:  this is incidental finding by CTA. CTA showed right middle lobe endobronchial obstruction or compression at the level of the proximal subsegmental bronchi resulting in right middle lobe collapse. No well-defined obstructive mass.  D/w pulmonology for possible bronchoscopy or alternatively follow-up CT scan in 3-6 months --> plan to follow in office, Dr Belia Heman to arrange    underweight based on BMI: Body mass index is 18.55 kg/m.   Body mass index is 20.58 kg/m.  VTE ppx: apixaban (ELIQUIS) tablet 2.5 mg Start: 10/19/23 1445 apixaban (ELIQUIS) tablet 2.5 mg   Diet:     Diet   Diet regular Fluid consistency: Thin   Consultants: ***  Subjective 10/21/23    Pt reports ***   Objective   Vitals:   10/20/23 2019 10/21/23 0005 10/21/23 0500 10/21/23 0621  BP: 110/61 114/63  103/61  Pulse: (!) 58 (!) 52  (!) 53  Resp: 16 18  18   Temp: 98.2 F (36.8 C) 98.4 F (36.9 C)  98.3 F  (36.8 C)  TempSrc:      SpO2: 96%   98%  Weight:   47.8 kg   Height:        Intake/Output Summary (Last 24 hours) at 10/21/2023 0802 Last data filed at 10/21/2023 0600 Gross per 24 hour  Intake 100 ml  Output 750 ml  Net -650 ml   Filed Weights   10/19/23 0500 10/20/23 0500 10/21/23 0500  Weight: 46.4 kg 47.9 kg 47.8 kg     Physical Exam: *** General: awake, alert, NAD HEENT: atraumatic, clear conjunctiva, anicteric sclera, MMM, hearing grossly normal Respiratory: normal respiratory effort. Cardiovascular: quick capillary refill, normal S1/S2, RRR, no JVD, murmurs Gastrointestinal: soft, NT, ND Nervous: A&O x3. no gross focal neurologic deficits, normal speech Extremities: moves all equally, no edema, normal tone Skin: dry, intact, normal temperature, normal color. No rashes, lesions or ulcers on exposed skin Psychiatry: normal mood, congruent affect  Labs   I have personally reviewed the following labs and imaging studies CBC    Component Value Date/Time   WBC 11.3 (H) 10/20/2023 0600   RBC 3.14 (L) 10/20/2023 0600   HGB 10.3 (L) 10/20/2023 0600   HGB 14.1 06/04/2012 1008   HCT 30.5 (L) 10/20/2023 0600   HCT 42.4 06/04/2012 1008   PLT 231 10/20/2023 0600   PLT 263 06/04/2012 1008   MCV 97.1 10/20/2023 0600   MCV 95 06/04/2012 1008   MCH 32.8 10/20/2023 0600   MCHC 33.8 10/20/2023 0600   RDW 13.9 10/20/2023 0600   RDW 12.5 06/04/2012 1008   LYMPHSABS 0.6 (L) 06/26/2022 1828   LYMPHSABS 1.3 06/03/2012 1437   MONOABS 0.5 06/26/2022 1828   MONOABS 0.5 06/03/2012 1437   EOSABS 0.0 06/26/2022 1828   EOSABS 0.0 06/03/2012 1437   BASOSABS 0.0 06/26/2022 1828   BASOSABS 0.0 06/03/2012 1437      Latest Ref Rng & Units 10/21/2023    3:40 AM 10/20/2023    6:00 AM 10/19/2023    6:28 AM  BMP  Glucose 70 - 99 mg/dL 147  829  562   BUN 8 - 23 mg/dL 59  49  41   Creatinine 0.44 - 1.00 mg/dL 1.30  8.65  7.84   Sodium 135 - 145 mmol/L 132  128  130   Potassium 3.5 - 5.1  mmol/L 4.5  4.4  4.5   Chloride 98 - 111 mmol/L 99  92  94   CO2 22 - 32 mmol/L 28  27  26    Calcium 8.9 - 10.3 mg/dL 8.8  8.6  8.4     No results found.  Disposition Plan & Communication  Patient status: Inpatient  Admitted From: {From:23814} Planned disposition location: {PLAN; DISPOSITION:26386} Anticipated discharge date: *** pending ***  Family Communication: ***    Author: Leeroy Bock, DO [facility] 10/21/2023, 8:02 AM   Available by Epic secure chat 7AM-7PM. If 7PM-7AM, please contact night-coverage.  [facility] contact information found on [email or web address].

## 2023-10-21 NOTE — [place] FL2 (Signed)
 [place] MEDICAID FL2 LEVEL OF CARE FORM     IDENTIFICATION  Patient Name: Angela Mason Birthdate: Jul 10, 1933 Sex: female Admission Date (Current Location): 10/17/2023  [place] and [place] Number:  Chiropodist and Address:  [facility], [address] [place], [place] [address]      Provider Number: [id number]  Attending Physician Name and Address:  Leeroy Bock, MD  Relative Name and Phone Number:  Dondra Spry (Daughter)  [phone] [facility])    Current Level of Care: Hospital Recommended Level of Care: Skilled Nursing Facility Prior Approval Number:    Date Approved/Denied:   PASRR Number: [id number]  Discharge Plan: SNF    Current Diagnoses: Patient Active Problem List   Diagnosis Date Noted   Atrial fibrillation with RVR (HCC) 10/17/2023   Acute CHF (HCC) 10/17/2023   Myocardial injury 10/17/2023   COPD exacerbation (HCC) 10/17/2023   Acute respiratory failure with hypoxia (HCC) 10/17/2023   Acute paranoid reaction (HCC) 09/12/2022   Constipation by delayed colonic transit 08/06/2022   Pedal edema 08/06/2022   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) 05/12/2022   HLD (hyperlipidemia) 04/30/2022   Hypothyroidism 04/30/2022   Other symptoms and signs involving cognitive functions and awareness 03/03/2022   History of falling 02/01/2022   Muscle weakness (generalized) 02/01/2022   Need for assistance with personal care 02/01/2022   Other abnormalities of gait and mobility 02/01/2022   Other specified fracture of left pubis, subsequent encounter for fracture with routine healing 02/01/2022   Personal history of other malignant neoplasm of skin 02/01/2022   Protein-calorie malnutrition, severe 01/29/2022   Closed fracture of superior ramus of left pubis (HCC) 01/27/2022   Leukocytosis 01/27/2022   Accidental fall 01/27/2022   COPD (chronic obstructive  pulmonary disease) (HCC) 01/27/2022   MDD (major depressive disorder), recurrent episode, moderate (HCC) 07/31/2021   Insomnia 07/31/2021   History of delirium 03/05/2021   High risk medication use 03/05/2021   Unspecified macular degeneration 10/16/2020   Severe major depression, single episode, with psychotic features (HCC) 10/12/2020   UTI (urinary tract infection) 10/04/2020   HTN (hypertension)    Hyponatremia    Underweight 08/08/2020   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 08/04/2018   Cystocele, midline 05/05/2017   Nocturia 10/07/2016   Unstable bladder 10/07/2016   Vaginal atrophy 01/23/2016   Procidentia of uterus 11/01/2015   Midline cystocele 11/01/2015    Orientation RESPIRATION BLADDER Height & Weight     Time  O2 (022L) Continent Weight: 47.8 kg Height:  5' (152.4 cm)  BEHAVIORAL SYMPTOMS/MOOD NEUROLOGICAL BOWEL NUTRITION STATUS  Other (Comment) (n/a)  (n/a) Continent Diet  AMBULATORY STATUS COMMUNICATION OF NEEDS Skin   Limited Assist Verbally Normal                       Personal Care Assistance Level of Assistance  Bathing, Dressing Bathing Assistance: Limited assistance   Dressing Assistance: Limited assistance     Functional Limitations Info  Sight Sight Info: Impaired Hearing Info: Adequate      SPECIAL CARE FACTORS FREQUENCY  PT (By licensed PT), OT (By licensed OT)     PT Frequency: Min 2x weekly OT Frequency: Min 2x weekly            Contractures Contractures Info: Not present    Additional Factors Info  Code Status, Allergies Code Status Info: FULL Allergies Info: Gentamicin, Levofloxacin  Current Medications (10/21/2023):  This is the current hospital active medication list Current Facility-Administered Medications  Medication Dose Route Frequency Provider Last Rate Last Admin   acetaminophen (TYLENOL) tablet 650 mg  650 mg Oral Q6H PRN Lorretta Harp, MD       albuterol (PROVENTIL) (2.5  MG/3ML) 0.083% nebulizer solution 2.5 mg  2.5 mg Nebulization Q6H PRN Lorretta Harp, MD       alum & mag hydroxide-simeth (MAALOX/MYLANTA) 200-200-20 MG/5ML suspension 5 mL  5 mL Oral Q4H PRN Lorretta Harp, MD       amiodarone (PACERONE) tablet 400 mg  400 mg Oral BID Hudson, Caralyn, PA-C   400 mg at 10/21/23 1027   Followed by   Melene Muller ON 10/29/2023] amiodarone (PACERONE) tablet 200 mg  200 mg Oral Daily Hudson, [place], PA-C       apixaban (ELIQUIS) tablet 2.5 mg  2.5 mg Oral BID Hudson, Caralyn, PA-C   2.5 mg at 10/21/23 1026   ARIPiprazole (ABILIFY) tablet 2 mg  2 mg Oral Q breakfast Lorretta Harp, MD   2 mg at 10/21/23 1026   aspirin EC tablet 81 mg  81 mg Oral Daily Lorretta Harp, MD   81 mg at 10/21/23 1026   dapagliflozin propanediol (FARXIGA) tablet 10 mg  10 mg Oral Daily Adrian Blackwater A, MD   10 mg at 10/21/23 1027   dextromethorphan-guaiFENesin (MUCINEX DM) 30-600 MG per 12 hr tablet 1 tablet  1 tablet Oral BID PRN Lorretta Harp, MD       doxycycline (VIBRA-TABS) tablet 100 mg  100 mg Oral Q12H Lorretta Harp, MD   100 mg at 10/21/23 1026   erythromycin ophthalmic ointment 1 Application  1 Application Both Eyes QHS Lorretta Harp, MD   1 Application at 10/18/23 2200   feeding supplement (ENSURE ENLIVE / ENSURE PLUS) liquid 237 mL  237 mL Oral TID BM Sunnie Nielsen, DO   237 mL at 10/20/23 1351   ipratropium (ATROVENT) nebulizer solution 0.5 mg  0.5 mg Nebulization Q4H PRN Sunnie Nielsen, DO       LORazepam (ATIVAN) injection 1 mg  1 mg Intravenous Q6H PRN Sunnie Nielsen, DO   1 mg at 10/19/23 2200   magnesium hydroxide (MILK OF MAGNESIA) suspension 5 mL  5 mL Oral Daily PRN Lorretta Harp, MD       methylPREDNISolone sodium succinate (SOLU-MEDROL) 40 mg/mL injection 40 mg  40 mg Intravenous Q12H Lorretta Harp, MD   40 mg at 10/21/23 1027   metoprolol succinate (TOPROL-XL) 24 hr tablet 12.5 mg  12.5 mg Oral BID Leeroy Bock, MD       multivitamin (PROSIGHT) tablet 1 tablet  1 tablet Oral BID Lorretta Harp, MD   1 tablet at 10/21/23 1027   nystatin (MYCOSTATIN/NYSTOP) topical powder 1 Application  1 Application Topical BID Lorretta Harp, MD   1 Application at 10/21/23 1048   omega-3 acid ethyl esters (LOVAZA) capsule 1 g  1 g Oral Daily Lorretta Harp, MD   1 g at 10/21/23 1027   ondansetron (ZOFRAN) injection 4 mg  4 mg Intravenous Q8H PRN Lorretta Harp, MD       Oral care mouth rinse  15 mL Mouth Rinse PRN Mansy, Jan A, MD       polyvinyl alcohol (LIQUIFILM TEARS) 1.4 % ophthalmic solution 1 drop  1 drop Both Eyes QHS Lorretta Harp, MD       pravastatin (PRAVACHOL) tablet 20 mg  20 mg Oral q1800 Lorretta Harp, MD  20 mg at 10/20/23 9147     Discharge Medications: Please see discharge summary for a list of discharge medications.  Relevant Imaging Results:  Relevant Lab Results:   Additional Information SSN:[id number]  Truddie Hidden, RN

## 2023-10-21 NOTE — Progress Notes (Signed)
 SATURATION QUALIFICATIONS: (This note is used to comply with regulatory documentation for home oxygen)  Patient Saturations on Room Air at Rest = 85%  Patient Saturations on Room Air while Ambulating = Not done  Patient Saturations on 2 Liters of oxygen while Ambulating = 91%  Patient needs oxygen for home for hypoxemia with movement and at rest

## 2023-10-21 NOTE — TOC Progression Note (Addendum)
 Transition of Care [facility]) - Progression Note    Patient Details  Name: Angela Mason MRN: [id number] Date of Birth: 1933-03-28  [facility] CM/SW Contact  Truddie Hidden, RN Phone Number: 10/21/2023, 12:15 PM  Clinical Narrative:    Spoke with patient and daughter at her bedside. Patient daughter is requesting SNF. She was advised [facility] beds for their residents however physical therapist recommendation is required and Berkley Harvey must be approved.   11:45pm PT recommendation upgraded to SNF.  Michel Santee in admissions at [facility] notified.   12:16pm  FL2 completed    Expected Discharge Plan: Home/Self Care Barriers to Discharge: Continued Medical Work up  Expected Discharge Plan and Services                                               Social Determinants of Health (SDOH) Interventions SDOH Screenings   Food Insecurity: No Food Insecurity (10/18/2023)  Housing: Low Risk  (10/18/2023)  Transportation Needs: No Transportation Needs (10/18/2023)  Utilities: Not At Risk (10/18/2023)  Depression (PHQ2-9): Low Risk  (01/14/2023)  Social Connections: Socially Isolated (10/18/2023)  Tobacco Use: Medium Risk (10/17/2023)    Readmission Risk Interventions     No data to display

## 2023-10-21 NOTE — Progress Notes (Signed)
 Physical Therapy Treatment Patient Details Name: Angela Mason MRN: [id number] DOB: 14-Feb-1933 Today's Date: 10/21/2023   History of Present Illness Pt is an 89 yo female that presented to ED for SOB, dizziness, swelling of bilateral legs. Workup showed new onset afib with RVR. PMH of HLD, HTN, COPD, macular degeneration.    PT Comments  Patient alert, agreeable to PT, oriented x4 and family at bedside today. Pt placed on 2L with RN consent due to desaturation to 85% on room air prior to PT arrival. Pt exhibited and increased need for assistance today. Bed mobility with CGA, pt reliant on bed rails to complete, HOB elevated. Sit <> stand with RW and CGA attempted, pt unable to successfully stand up without minA, and tactile cues for safe hand placement. She was able to ambulate ~39ft with RW and CGA-minA for steadying, pt needed cues for RW management as well. For safe descent to commode use of safety bar and minA from PT. Pt with family at bedside and on commode for continued attempts at [facility], RN aware of pt status. Care team updated on pt performance, recommend physical therapy services (<3hrs a day) to return to PLOF.    If plan is discharge home, recommend the following: A little help with bathing/dressing/bathroom;Assistance with cooking/housework;Assist for transportation;Help with stairs or ramp for entrance   Can travel by private vehicle        Equipment Recommendations  None recommended by PT    Recommendations for Other Services       Precautions / Restrictions Precautions Precautions: Fall Recall of Precautions/Restrictions: Intact Restrictions Weight Bearing Restrictions Per Provider Order: No     Mobility  Bed Mobility Overal bed mobility: Needs Assistance Bed Mobility: Supine to Sit     Supine to sit: Contact guard, Used rails, HOB elevated     General bed mobility comments: reliant on bed rails, extra time    Transfers Overall transfer level: Needs  assistance Equipment used: Rolling walker (2 wheels) Transfers: Sit to/from Stand Sit to Stand: Min assist, Contact guard assist           General transfer comment: attempted with CGA, but pt unable to safely come up into standing, spontaneously returned to sitting EOB. minA needed for posterior lean, steadying    Ambulation/Gait Ambulation/Gait assistance: Contact guard assist Gait Distance (Feet): 20 Feet Assistive device: Rolling walker (2 wheels)   Gait velocity: decreased     General Gait Details: very slow, more difficulty with RW positioning   Stairs             Wheelchair Mobility     Tilt Bed    Modified Rankin (Stroke Patients Only)       Balance Overall balance assessment: Needs assistance Sitting-balance support: Feet supported Sitting balance-Leahy Scale: Good     Standing balance support: Bilateral upper extremity supported, Reliant on assistive device for balance Standing balance-Leahy Scale: Poor                              Communication    Cognition Arousal: Alert Behavior During Therapy: WFL for tasks assessed/performed   PT - Cognitive impairments: No apparent impairments                       PT - Cognition Comments: pt alert, oriented to self, place, situation Following commands: Intact      Cueing Cueing Techniques: Verbal cues  Exercises Other  Exercises Other Exercises: pt sitting on commode at end of session, family present. pt did need steadying assist and guidance to land on toilet safety    General Comments        Pertinent Vitals/Pain Pain Assessment Pain Assessment: No/denies pain    Home Living                          Prior Function            PT Goals (current goals can now be found in the care plan section) Progress towards PT goals: Progressing toward goals    Frequency    Min 2X/week      PT Plan      Co-evaluation              AM-PAC PT "6  Clicks" Mobility   Outcome Measure  Help needed turning from your back to your side while in a flat bed without using bedrails?: A Little Help needed moving from lying on your back to sitting on the side of a flat bed without using bedrails?: A Little Help needed moving to and from a bed to a chair (including a wheelchair)?: A Little Help needed standing up from a chair using your arms (e.g., wheelchair or bedside chair)?: A Little Help needed to walk in hospital room?: A Little Help needed climbing 3-5 steps with a railing? : A Lot 6 Click Score: 17    End of Session Equipment Utilized During Treatment: Gait belt Activity Tolerance: Patient tolerated treatment well Patient left: Other (comment);with family/visitor present (seated on commode, family in room and RN aware of pt status) Nurse Communication: Mobility status PT Visit Diagnosis: Other abnormalities of gait and mobility (R26.89);Difficulty in walking, not elsewhere classified (R26.2);Muscle weakness (generalized) (M62.81)     Time: 4098-1191 PT Time Calculation (min) (ACUTE ONLY): 33 min  Charges:    $Therapeutic Activity: 23-37 mins PT General Charges $$ ACUTE PT VISIT: 1 Visit                     Olga Coaster PT, DPT 11:37 AM,10/21/23

## 2023-10-21 NOTE — Discharge Summary (Signed)
 Physician Discharge Summary  Patient: Angela Mason:[id number] DOB: [birth year]   Code Status: Full Code Admit date: 10/17/2023 Discharge date: 10/21/2023 Disposition: Skilled nursing facility, PT, OT, nurse aid, RN, and social worker PCP: [name], MD  Recommendations for Outpatient Follow-up:  Follow up with PCP within 1-2 weeks Regarding general hospital follow up and preventative care Recommend wean O2 as tolerated. Monitor HR, renal function.  Follow up with pulmonology, Dr. Belia Heman. Regarding lung collapse/mass finding.  Discharge Diagnoses:  Principal Problem:   Acute CHF (HCC) Active Problems:   Myocardial injury   Atrial fibrillation with RVR (HCC)   COPD exacerbation (HCC)   Acute respiratory failure with hypoxia (HCC)   HLD (hyperlipidemia)   Hypothyroidism   Hyponatremia   Protein-calorie malnutrition, severe  Brief Hospital Course Summary: Angela Mason is a 89 y.o. female with medical history significant of HTN, HLD, COPD, hypothyroidism, cystocele, who presents with SOB x several days, worsening. Assoc w/ palpitations, heart racing, bl le edema.   She was found to be in acute CHF exacerbation and new onset atrial fibrillation. She had O2 requirement of 2L which appears to have been a PRN dose prior to admission. CTA was negative for PE but did show RML obstruction/collapse. Pulmonology reviewed and recommended outpatient evaluation with possible bronchoscopy.  Cardiology consulted for management of afib, CHF. Started on amiodarone infusion, heparin infusion and converted to sinus rhythm.  Transitioned to PO amiodarone, metoprolol and eliquis.  She tolerated this well and had mild bradycardia so stopped metoprolol prior to dc. Amiodarone will be titrated down with cardiology follow up to discuss long-term management.  Echo EF 45-50%, mild decreased LV fxn, LV global hypokinesis, indeterminate diastolic parameters, RV fxn moderately reduced, LA and RA  severely dilated, moderate MR, mild-mod TR, Aortic valve calcification w/o stenosis.  PT/OT evaluated and recommended SNF at dc.  She was attempted to wean from O2 but will require 2L at dc and may be weaned as tolerated. Completion of COPD exacerbation is prescribed to be completed post-dc.   Statin medications changed due to interaction with amiodarone.   All other chronic conditions were treated with home medications.    Discharge Condition: Good, improved Recommended discharge diet: Regular healthy diet  Consultations: Cardiology Pulmonology   Procedures/Studies: Amiodarone gtt Echo   Allergies as of 10/21/2023       Reactions   Gentamicin    Levofloxacin Diarrhea        Medication List     STOP taking these medications    chlorhexidine 0.12 % solution Commonly known as: PERIDEX   losartan 50 MG tablet Commonly known as: COZAAR   lovastatin 20 MG tablet Commonly known as: MEVACOR Replaced by: pravastatin 20 MG tablet       TAKE these medications    acetaminophen 325 MG tablet Commonly known as: TYLENOL Take 650 mg by mouth every 4 (four) hours as needed.   albuterol 108 (90 Base) MCG/ACT inhaler Commonly known as: VENTOLIN HFA Inhale 2 puffs into the lungs every 4 (four) hours as needed for wheezing or shortness of breath.   aluminum-magnesium hydroxide 200-200 MG/5ML suspension Take 5 mLs by mouth every 4 (four) hours as needed for indigestion.   amiodarone 200 MG tablet Commonly known as: PACERONE Take 1 tablet (200 mg total) by mouth 2 (two) times daily for 14 days, THEN 1 tablet (200 mg total) daily. Start taking on: October 21, 2023   apixaban 2.5 MG Tabs tablet Commonly known as: ELIQUIS Take  1 tablet (2.5 mg total) by mouth 2 (two) times daily.   ARIPiprazole 2 MG tablet Commonly known as: Abilify Take 1 tablet (2 mg total) by mouth daily with breakfast.   bismuth subsalicylate 262 MG/15ML suspension Commonly known as: PEPTO  BISMOL Take 30 mLs by mouth as needed.   dapagliflozin propanediol 10 MG Tabs tablet Commonly known as: FARXIGA Take 1 tablet (10 mg total) by mouth daily. Start taking on: October 22, 2023   dextromethorphan-guaiFENesin 10-100 MG/5ML liquid Commonly known as: ROBITUSSIN-DM Take 5 mLs by mouth every 4 (four) hours as needed for cough.   dextrose 40 % Gel Commonly known as: GLUTOSE Take 1 Tube by mouth as needed for low blood sugar.   doxycycline 100 MG tablet Commonly known as: VIBRA-TABS Take 1 tablet (100 mg total) by mouth every 12 (twelve) hours for 2 days.   erythromycin ophthalmic ointment Place 1 Application into both eyes at bedtime.   estradiol 0.1 MG/GM vaginal cream Commonly known as: ESTRACE Place 1 Applicatorful vaginally 2 (two) times a week.   FISH OIL CONCENTRATE PO Take 1 tablet by mouth daily.   magnesium hydroxide 400 MG/5ML suspension Commonly known as: MILK OF MAGNESIA Take 5 mLs by mouth daily as needed for mild constipation.   nystatin powder Commonly known as: MYCOSTATIN/NYSTOP Apply 1 Application topically 2 (two) times daily.   OXYGEN 2 lpm for dyspnea or SOB   pravastatin 20 MG tablet Commonly known as: PRAVACHOL Take 1 tablet (20 mg total) by mouth daily at 6 PM. Replaces: lovastatin 20 MG tablet   PreserVision AREDS Tabs Take 1 capsule by mouth 2 (two) times daily.   Refresh 1.4-0.6 % Soln Generic drug: Polyvinyl Alcohol-Povidone PF Place 1 drop into both eyes in the morning and at bedtime.        Follow-up Information     Alluri, Meryl Dare, MD. Go in 1 week(s).   Specialty: Cardiology Contact information: [address] [place] [address] [phone]                 Subjective   Pt reports feeling well. No SOB at rest. Denies palpitations.  She is anxious to go home so that she can celebrate her birthday with her daughter and go to restaurants.   All questions and concerns were addressed at time of  discharge.  Objective  Blood pressure 126/60, pulse (!) 56, temperature (!) 97.4 F (36.3 C), resp. rate 18, height 5' (1.524 m), weight 47.8 kg, SpO2 99%.   General: Pt is alert, awake. Appears intermittently anxious/tearful.  Cardiovascular: RRR, S1/S2 +, no rubs, no gallops Respiratory: CTA bilaterally, no wheezing, no rhonchi Abdominal: Soft, NT, ND, bowel sounds + Extremities: no edema, no cyanosis  The results of significant diagnostics from this hospitalization (including imaging, microbiology, ancillary and laboratory) are listed below for reference.   Imaging studies: ECHOCARDIOGRAM COMPLETE Result Date: 10/18/2023    ECHOCARDIOGRAM REPORT   Patient Name:   KEYATTA TOLLES Date of Exam: 10/18/2023 Medical Rec #:  [id number]       Height:       60.0 in Accession #:    [id number]      Weight:       95.0 lb Date of Birth:  [birth year]       BSA:          1.360 m Patient Age:    89 years        BP:  118/83 mmHg Patient Gender: F               HR:           116 bpm. Exam Location:  [facility] Procedure: 2D Echo, Cardiac Doppler and Color Doppler (Both Spectral and Color            Flow Doppler were utilized during procedure). Indications:     CHF- Acute Diastolic I50.31  History:         Patient has no prior history of Echocardiogram examinations.  Sonographer:     Elwin Sleight RDCS Referring Phys:  Wynona Neat NIU Diagnosing Phys: Adrian Blackwater  Sonographer Comments: Image acquisition challenging due to respiratory motion. IMPRESSIONS  1. Left ventricular ejection fraction, by estimation, is 45 to 50%. The left ventricle has mildly decreased function. The left ventricle demonstrates global hypokinesis. The left ventricular internal cavity size was moderately dilated. There is mild concentric left ventricular hypertrophy. Left ventricular diastolic parameters are indeterminate.  2. Right ventricular systolic function is moderately reduced. The right ventricular size is moderately enlarged. Mildly  increased right ventricular wall thickness.  3. Left atrial size was severely dilated.  4. Right atrial size was severely dilated.  5. The mitral valve is grossly normal. Moderate mitral valve regurgitation.  6. Tricuspid valve regurgitation is mild to moderate.  7. The aortic valve is calcified. Aortic valve regurgitation is not visualized. Aortic valve sclerosis/calcification is present, without any evidence of aortic stenosis. FINDINGS  Left Ventricle: Left ventricular ejection fraction, by estimation, is 45 to 50%. The left ventricle has mildly decreased function. The left ventricle demonstrates global hypokinesis. Strain was performed and the global longitudinal strain is indeterminate. The left ventricular internal cavity size was moderately dilated. There is mild concentric left ventricular hypertrophy. Left ventricular diastolic parameters are indeterminate. Right Ventricle: The right ventricular size is moderately enlarged. Mildly increased right ventricular wall thickness. Right ventricular systolic function is moderately reduced. Left Atrium: Left atrial size was severely dilated. Right Atrium: Right atrial size was severely dilated. Pericardium: There is no evidence of pericardial effusion. Mitral Valve: The mitral valve is grossly normal. Moderate mitral valve regurgitation. Tricuspid Valve: The tricuspid valve is grossly normal. Tricuspid valve regurgitation is mild to moderate. Aortic Valve: The aortic valve is calcified. Aortic valve regurgitation is not visualized. Aortic valve sclerosis/calcification is present, without any evidence of aortic stenosis. Aortic valve peak gradient measures 10.6 mmHg. Pulmonic Valve: The pulmonic valve was grossly normal. Pulmonic valve regurgitation is mild. Aorta: The aortic root, ascending aorta and aortic arch are all structurally normal, with no evidence of dilitation or obstruction. IAS/Shunts: No atrial level shunt detected by color flow Doppler. Additional  Comments: 3D was performed not requiring image post processing on an independent workstation and was indeterminate.  LEFT VENTRICLE PLAX 2D LVIDd:         3.20 cm   Diastology LVIDs:         2.40 cm   LV e' lateral: 12.40 cm/s LV PW:         1.10 cm LV IVS:        1.00 cm LVOT diam:     1.90 cm LV SV:         29 LV SV Index:   22 LVOT Area:     2.84 cm  RIGHT VENTRICLE RV Basal diam:  2.80 cm LEFT ATRIUM             Index  RIGHT ATRIUM           Index LA diam:        3.50 cm 2.57 cm/m   RA Area:     17.00 cm LA Vol (A2C):   32.7 ml 24.04 ml/m  RA Volume:   48.00 ml  35.29 ml/m LA Vol (A4C):   39.2 ml 28.82 ml/m LA Biplane Vol: 39.8 ml 29.26 ml/m  AORTIC VALVE                 PULMONIC VALVE AV Area (Vmax): 1.29 cm     PV Vmax:        0.64 m/s AV Vmax:        162.50 cm/s  PV Peak grad:   1.6 mmHg AV Peak Grad:   10.6 mmHg    RVOT Peak grad: 1 mmHg LVOT Vmax:      73.90 cm/s LVOT Vmean:     50.100 cm/s LVOT VTI:       0.104 m  AORTA Ao Root diam: 2.80 cm TRICUSPID VALVE TR Peak grad:   33.6 mmHg TR Vmax:        290.00 cm/s  SHUNTS Systemic VTI:  0.10 m Systemic Diam: 1.90 cm Adrian Blackwater Electronically signed by Adrian Blackwater Signature Date/Time: 10/18/2023/12:36:21 PM    Final    CT Angio Chest PE W and/or Wo Contrast Result Date: 10/17/2023 CLINICAL DATA:  Shortness of breath.  History of COPD.  Ex-smoker. EXAM: CT ANGIOGRAPHY CHEST WITH CONTRAST TECHNIQUE: Multidetector CT imaging of the chest was performed using the standard protocol during bolus administration of intravenous contrast. Multiplanar CT image reconstructions and MIPs were obtained to evaluate the vascular anatomy. RADIATION DOSE REDUCTION: This exam was performed according to the departmental dose-optimization program which includes automated exposure control, adjustment of the mA and/or kV according to patient size and/or use of iterative reconstruction technique. CONTRAST:  60mL OMNIPAQUE IOHEXOL 350 MG/ML SOLN COMPARISON:  Plain film  of earlier today.  No prior CT. FINDINGS: Cardiovascular: The quality of this exam for evaluation of pulmonary embolism is good. Although the bolus is well timed, limitations include minimal motion, EKG wire artifact and patient arm position, not raised above the head. No evidence of pulmonary embolism. Advanced aortic and branch vessel atherosclerosis. The aorta is not well opacified secondary to bolus timing. Moderate cardiomegaly, without pericardial effusion. Left main and 3 vessel coronary artery calcification. Mediastinum/Nodes: No mediastinal or hilar adenopathy. Lungs/Pleura: Small bilateral pleural effusions. Moderate centrilobular emphysema. Right middle lobe endobronchial compression/narrowing at the level of the proximal segmental bronchi including on 68/4. This results in right middle lobe collapse. No well-defined obstructive mass. Biapical pleuroparenchymal scarring. Left greater than right base dependent compressive atelectasis. Upper Abdomen: Reflux of contrast into the hepatic veins suggests elevated right heart pressures. Artifact degradation continuing into the upper abdomen. No gross acute finding. Musculoskeletal: Lower thoracic spondylosis with S-shaped thoracolumbar spine curvature. Review of the MIP images confirms the above findings. IMPRESSION: 1.  No evidence of pulmonary embolism. 2. Mild multifocal degradation as detailed above. 3. Small bilateral pleural effusions. Findings of elevated right heart pressures. Suspect a component of fluid overload. 4. Right middle lobe endobronchial obstruction or compression at the level of the proximal subsegmental bronchi resulting in right middle lobe collapse. No well-defined obstructive mass. Consider further evaluation with bronchoscopy with attention to the right middle lobe bronchus. Alternatively, especially given patient age and comorbidities, CT follow-up at 6 months could be performed. 5. Aortic atherosclerosis (ICD10-I70.0), coronary  artery  atherosclerosis and emphysema (ICD10-J43.9). Electronically Signed   By: Jeronimo Greaves M.D.   On: 10/17/2023 18:40   DG Chest 2 View Result Date: 10/17/2023 CLINICAL DATA:  Shortness of breath EXAM: CHEST - 2 VIEW COMPARISON:  Chest radiograph dated 06/26/2022 FINDINGS: Normal lung volumes. No focal consolidations. Trace blunting of bilateral costophrenic angles. No pneumothorax. Similar mildly enlarged cardiomediastinal silhouette. No acute osseous abnormality. Unchanged compression deformity of T4. IMPRESSION: 1. Small bilateral pleural effusions. 2. Similar mild cardiomegaly. Electronically Signed   By: Agustin Cree M.D.   On: 10/17/2023 15:23    Labs: Basic Metabolic Panel: Recent Labs  Lab 10/17/23 1638 10/17/23 1930 10/18/23 0250 10/18/23 1206 10/19/23 0628 10/20/23 0600 10/21/23 0340  NA  --    < > 132* 133* 130* 128* 132*  K  --    < > 3.8 4.8 4.5 4.4 4.5  CL  --    < > 96* 98 94* 92* 99  CO2  --    < > 26 27 26 27 28   GLUCOSE  --    < > 147* 172* 148* 145* 144*  BUN  --    < > 19 24* 41* 49* 59*  CREATININE  --    < > 0.56 0.66 1.06* 0.83 0.94  CALCIUM  --    < > 8.1* 8.1* 8.4* 8.6* 8.8*  MG 2.3  --   --   --   --   --   --   PHOS 3.2  --   --   --   --   --   --    < > = values in this interval not displayed.   CBC: Recent Labs  Lab 10/17/23 1502 10/18/23 0250 10/19/23 0628 10/20/23 0600  WBC 11.7* 11.4* 13.3* 11.3*  HGB 10.9* 11.1* 10.5* 10.3*  HCT 34.4* 33.9* 31.4* 30.5*  MCV 101.8* 98.5 96.6 97.1  PLT 231 208 238 231   Microbiology: Results for orders placed or performed during the hospital encounter of 10/17/23  SARS Coronavirus 2 by RT PCR (hospital order, performed in [facility] hospital lab) *cepheid single result test* Nasopharyngeal Swab     Status: None   Collection Time: 10/17/23  7:30 PM   Specimen: Nasopharyngeal Swab; Nasal Swab  Result Value Ref Range Status   SARS Coronavirus 2 by RT PCR NEGATIVE NEGATIVE Final    Comment: (NOTE) SARS-CoV-2  target nucleic acids are NOT DETECTED.  The SARS-CoV-2 RNA is generally detectable in upper and lower respiratory specimens during the acute phase of infection. The lowest concentration of SARS-CoV-2 viral copies this assay can detect is 250 copies / mL. A negative result does not preclude SARS-CoV-2 infection and should not be used as the sole basis for treatment or other patient management decisions.  A negative result may occur with improper specimen collection / handling, submission of specimen other than nasopharyngeal swab, presence of viral mutation(s) within the areas targeted by this assay, and inadequate number of viral copies (<250 copies / mL). A negative result must be combined with clinical observations, patient history, and epidemiological information.  Fact Sheet for Patients:   RoadLapTop.co.za  Fact Sheet for Healthcare Providers: http://kim-miller.com/  This test is not yet approved or  cleared by the [place] FDA and has been authorized for detection and/or diagnosis of SARS-CoV-2 by FDA under an Emergency Use Authorization (EUA).  This EUA will remain in effect (meaning this test can be used) for the duration of the  COVID-19 declaration under Section 564(b)(1) of the Act, 21 U.S.C. section 360bbb-3(b)(1), unless the authorization is terminated or revoked sooner.  Performed at [facility], [address] [place], [place] [address]   Respiratory (~20 pathogens) panel by PCR     Status: None   Collection Time: 10/17/23  7:30 PM   Specimen: Nasopharyngeal Swab; Respiratory  Result Value Ref Range Status   Adenovirus NOT DETECTED NOT DETECTED Final   Coronavirus 229E NOT DETECTED NOT DETECTED Final    Comment: (NOTE) The Coronavirus on the Respiratory Panel, DOES NOT test for the novel  Coronavirus (2019 nCoV)    Coronavirus HKU1 NOT DETECTED NOT DETECTED Final   Coronavirus NL63 NOT DETECTED NOT  DETECTED Final   Coronavirus OC43 NOT DETECTED NOT DETECTED Final   Metapneumovirus NOT DETECTED NOT DETECTED Final   Rhinovirus / Enterovirus NOT DETECTED NOT DETECTED Final   Influenza A NOT DETECTED NOT DETECTED Final   Influenza B NOT DETECTED NOT DETECTED Final   Parainfluenza Virus 1 NOT DETECTED NOT DETECTED Final   Parainfluenza Virus 2 NOT DETECTED NOT DETECTED Final   Parainfluenza Virus 3 NOT DETECTED NOT DETECTED Final   Parainfluenza Virus 4 NOT DETECTED NOT DETECTED Final   Respiratory Syncytial Virus NOT DETECTED NOT DETECTED Final   Bordetella pertussis NOT DETECTED NOT DETECTED Final   Bordetella Parapertussis NOT DETECTED NOT DETECTED Final   Chlamydophila pneumoniae NOT DETECTED NOT DETECTED Final   Mycoplasma pneumoniae NOT DETECTED NOT DETECTED Final    Comment: Performed at [facility], [address] [place], [place] [address]    Time coordinating discharge: Over 30 minutes  Leeroy Bock, MD  [facility] 10/21/2023, 1:08 PM

## 2023-10-21 NOTE — Plan of Care (Signed)
  Problem: Education: Goal: Knowledge of General Education information will improve Description: Including pain rating scale, medication(s)/side effects and non-pharmacologic comfort measures Outcome: Progressing   Problem: Health Behavior/Discharge Planning: Goal: Ability to manage health-related needs will improve Outcome: Progressing   Problem: Clinical Measurements: Goal: Ability to maintain clinical measurements within normal limits will improve Outcome: Progressing Goal: Will remain free from infection Outcome: Progressing Goal: Diagnostic test results will improve Outcome: Progressing Goal: Respiratory complications will improve Outcome: Progressing Goal: Cardiovascular complication will be avoided Outcome: Progressing   Problem: Activity: Goal: Risk for activity intolerance will decrease Outcome: Progressing   Problem: Coping: Goal: Level of anxiety will decrease Outcome: Progressing   Problem: Elimination: Goal: Will not experience complications related to bowel motility Outcome: Progressing Goal: Will not experience complications related to urinary retention Outcome: Progressing   Problem: Safety: Goal: Ability to remain free from injury will improve Outcome: Progressing   Problem: Education: Goal: Knowledge of disease or condition will improve Outcome: Progressing Goal: Knowledge of the prescribed therapeutic regimen will improve Outcome: Progressing Goal: Individualized Educational Video(s) Outcome: Progressing   Problem: Activity: Goal: Ability to tolerate increased activity will improve Outcome: Progressing Goal: Will verbalize the importance of balancing activity with adequate rest periods Outcome: Progressing   Problem: Respiratory: Goal: Ability to maintain a clear airway will improve Outcome: Progressing Goal: Levels of oxygenation will improve Outcome: Progressing Goal: Ability to maintain adequate ventilation will improve Outcome:  Progressing

## 2023-10-21 NOTE — Progress Notes (Signed)
 [facility] CLINIC CARDIOLOGY PROGRESS NOTE   [name] MRN: [id number] DOB/AGE: Jul 10, 1933 89 y.o.  Admit date: 10/17/2023 Referring Physician Dr. Sunnie Nielsen Primary Physician [name], MD  Primary Cardiologist None Reason for Consultation AF RVR  HPI: Angela Mason is a 89 y.o. female with a past medical history of hypertension, hyperlipidemia, COPD, hypothyroidism who presented to the ED on 10/17/2023 for shortness of breath. Found to be in AF RVR. Cardiology consulted for further evaluation.   Interval History:  -Patient seen and examined this AM, reports she is feeling well today, eating breakfast.  -Denies any CP, SOB, palpitations.  -Converted to NSR and remains in this rhythm with controlled heart rate.  Transitioned to p.o. amiodarone.  Review of systems complete and found to be negative unless listed above    Vitals:   10/21/23 0005 10/21/23 0500 10/21/23 0621 10/21/23 0817  BP: 114/63  103/61 119/66  Pulse: (!) 52  (!) 53 (!) 55  Resp: 18  18 18   Temp: 98.4 F (36.9 C)  98.3 F (36.8 C) 97.8 F (36.6 C)  TempSrc:      SpO2:   98% 97%  Weight:  47.8 kg    Height:         Intake/Output Summary (Last 24 hours) at 10/21/2023 4403 Last data filed at 10/21/2023 0600 Gross per 24 hour  Intake 100 ml  Output 750 ml  Net -650 ml     PHYSICAL EXAM General: Well appearing elderly female, well nourished, in no acute distress. HEENT: Normocephalic and atraumatic. Neck: No JVD.  Lungs: Normal respiratory effort on 2L  Bend  . Clear bilaterally to auscultation. No wheezes, crackles, rhonchi.  Heart: HRRR. Normal S1 and S2 without gallops or murmurs. Radial & DP pulses 2+ bilaterally. Abdomen: Non-distended appearing.  Msk: Normal strength and tone for age. Extremities: No clubbing, cyanosis or edema.   Neuro: Alert and oriented X 3. Psych: Mood appropriate, affect congruent.    LABS: Basic Metabolic Panel: Recent Labs     10/20/23 0600 10/21/23 0340  NA 128* 132*  K 4.4 4.5  CL 92* 99  CO2 27 28  GLUCOSE 145* 144*  BUN 49* 59*  CREATININE 0.83 0.94  CALCIUM 8.6* 8.8*   Liver Function Tests: No results for input(s): "AST", "ALT", "ALKPHOS", "BILITOT", "PROT", "ALBUMIN" in the last 72 hours.  No results for input(s): "LIPASE", "AMYLASE" in the last 72 hours. CBC: Recent Labs    10/19/23 0628 10/20/23 0600  WBC 13.3* 11.3*  HGB 10.5* 10.3*  HCT 31.4* 30.5*  MCV 96.6 97.1  PLT 238 231   Cardiac Enzymes: No results for input(s): "CKTOTAL", "CKMB", "CKMBINDEX", "TROPONINIHS" in the last 72 hours.  BNP: No results for input(s): "BNP" in the last 72 hours.  D-Dimer: No results for input(s): "DDIMER" in the last 72 hours. Hemoglobin A1C: No results for input(s): "HGBA1C" in the last 72 hours. Fasting Lipid Panel: No results for input(s): "CHOL", "HDL", "LDLCALC", "TRIG", "CHOLHDL", "LDLDIRECT" in the last 72 hours. Thyroid Function Tests: No results for input(s): "TSH", "T4TOTAL", "T3FREE", "THYROIDAB" in the last 72 hours.  Invalid input(s): "FREET3"  Anemia Panel: No results for input(s): "VITAMINB12", "FOLATE", "FERRITIN", "TIBC", "IRON", "RETICCTPCT" in the last 72 hours.  No results found.    ECHO as above  TELEMETRY reviewed by me 10/21/23: sinus bradycardia rate 50s  EKG reviewed by me 10/21/23: today's EKG with NSR, initial EKG on presentation with AF RVR rate 131 bpm  DATA reviewed by me  10/21/23: last 24h vitals tele labs imaging I/O, hospitalist progress note  Principal Problem:   Acute CHF (HCC) Active Problems:   HLD (hyperlipidemia)   Hypothyroidism   Hyponatremia   Protein-calorie malnutrition, severe   Atrial fibrillation with RVR (HCC)   Myocardial injury   COPD exacerbation (HCC)   Acute respiratory failure with hypoxia (HCC)    ASSESSMENT AND PLAN: Angela Mason is a 89 y.o. female with a past medical history of hypertension, hyperlipidemia, COPD,  hypothyroidism who presented to the ED on 10/17/2023 for shortness of breath. Found to be in AF RVR. Cardiology consulted for further evaluation.   # Atrial fibrillation RVR # New onset atrial fibrillation # Acute HFmrEF Patient with no hx of heart failure presented with SOB and BNP elevated to 667. EKG reveal AF RVR, no prior hx of AF. Started on IV lasix and IV dilt in the ED ultimately transitioned to amiodarone due to poor response. Echo this admission with EF 45-50%, global hypokinesis.  -Continue amiodarone po 400 bid x10 days then decrease to 200 mg daily.  -Hold further diuresis. Monitor renal function closely.  -Continue metoprolol succinate 50 mg twice daily, farxiga 10 mg daily.  -Continue pravastatin 20 mg daily and aspirin 81 mg daily.  -Continue Eliquis 2.5 mg twice daily.  Dose appropriate given age and weight less than 60 kg.  Cardiology will sign off. Please haiku with questions or re-engage if needed.    This patient's case was discussed and created with Dr. Corky Sing and he is in agreement.  Signed:  Gale Journey, PA-C  10/21/2023, 9:58 AM [facility]

## 2023-10-21 NOTE — TOC Transition Note (Signed)
 Transition of Care [facility]) - Discharge Note   Patient Details  Name: Angela Mason MRN: [id number] Date of Birth: Nov 14, 1932  [facility]) CM/SW Contact:  Truddie Hidden, RN Phone Number: 10/21/2023, 1:46 PM   Clinical Narrative:    Sherron Monday with [name]  in admissions at [facility]  Per facility patient admission confirmed for today. Patient assigned room # 101 Nurse will call report to [phone] Face sheet and medical necessity forms printed to the floor to be added to the EMS pack EMS arranged "There are several ahead of her."  Discharge summary and SNF transfer report sent in HUB.  Nurse, and family notified spoke with her daughter, [name] signing off     Barriers to Discharge: Continued Medical Work up   Patient Goals and CMS Choice            Discharge Placement                       Discharge Plan and Services Additional resources added to the After Visit Summary for                                       Social Drivers of Health (SDOH) Interventions SDOH Screenings   Food Insecurity: No Food Insecurity (10/18/2023)  Housing: Low Risk  (10/18/2023)  Transportation Needs: No Transportation Needs (10/18/2023)  Utilities: Not At Risk (10/18/2023)  Depression (PHQ2-9): Low Risk  (01/14/2023)  Social Connections: Socially Isolated (10/18/2023)  Tobacco Use: Medium Risk (10/17/2023)     Readmission Risk Interventions     No data to display

## 2023-10-23 ENCOUNTER — Non-Acute Institutional Stay (SKILLED_NURSING_FACILITY): Payer: Self-pay | Admitting: Student

## 2023-10-23 ENCOUNTER — Encounter: Payer: Self-pay | Admitting: Student

## 2023-10-23 DIAGNOSIS — I509 Heart failure, unspecified: Secondary | ICD-10-CM | POA: Diagnosis not present

## 2023-10-23 DIAGNOSIS — J449 Chronic obstructive pulmonary disease, unspecified: Secondary | ICD-10-CM | POA: Diagnosis not present

## 2023-10-23 DIAGNOSIS — I4891 Unspecified atrial fibrillation: Secondary | ICD-10-CM

## 2023-10-23 DIAGNOSIS — E43 Unspecified severe protein-calorie malnutrition: Secondary | ICD-10-CM

## 2023-10-23 DIAGNOSIS — H353231 Exudative age-related macular degeneration, bilateral, with active choroidal neovascularization: Secondary | ICD-10-CM

## 2023-10-23 DIAGNOSIS — J9601 Acute respiratory failure with hypoxia: Secondary | ICD-10-CM | POA: Diagnosis not present

## 2023-10-23 DIAGNOSIS — K5901 Slow transit constipation: Secondary | ICD-10-CM

## 2023-10-23 DIAGNOSIS — F323 Major depressive disorder, single episode, severe with psychotic features: Secondary | ICD-10-CM

## 2023-10-23 NOTE — Progress Notes (Signed)
 Provider:  Coralyn Helling, M.D. Location:  Other [facility] Nursing Home Room Number: 101 A Place of Service:  SNF (31)  PCP: [name], MD Patient Care Team: [name], MD as PCP - General (Family Medicine)  Extended Emergency Contact Information Primary Emergency Contact: Mason,Angela Address: Note:  my last name is Mason, not [name]: [phone] Relation: Daughter Secondary Emergency Contact: Mason,Angela Address: [address] (neighbor - 100 yards away)          [place], [place] [address] [place] Phone: [phone] Relation: Friend  Code Status: Full Code  Goals of Care: Advanced Directive information    10/18/2023    2:06 PM  Advanced Directives  Does Patient Have a Medical Advance Directive? Yes  Type of Estate agent of [place];Living will  Does patient want to make changes to medical advance directive? No - Patient declined  Copy of Healthcare Power of Attorney in Chart? No - copy requested      Chief Complaint  Patient presents with   New Admit To SNF    New admission to [facility]     HPI: Patient is a 89 y.o. female seen today for admission to [facility] History of Present Illness The patient, with new onset atrial fibrillation and COPD, presents with atrial fibrillation and COPD exacerbation. She is accompanied by her daughter, Angela Mason.  She was diagnosed with new onset atrial fibrillation during a recent hospital visit. She describes feeling 'oozy' and unsteady on her feet, which prompted the hospital visit. She also feels unable to wake up and get going, noting that she is usually at breakfast by this time. She is currently on amiodarone for atrial fibrillation and a blood thinner, Eliquis, to reduce the risk of stroke.  She has a history of COPD and experienced an exacerbation that contributed to her recent hospitalization. Her nose was running last night, and she  discovered she was not receiving oxygen properly. She is currently on 2.5 liters of oxygen. She has been prescribed albuterol for wheezing, which occurs when active but not while sitting or lying down. She is also on doxycycline as part of her COPD management.  She takes four medications in the morning and two at night, but acknowledges that she now has more medications to manage. Her current medications include amiodarone, Eliquis, Abilify, doxycycline, Farxiga, fish oil, Preservision, pravastatin, vaginal estrogen cream, and eye drops.   Past Medical History:  Diagnosis Date   Cancer (HCC)    SKIN   COPD, mild (HCC)    Dyspnea    Edema    HLD (hyperlipidemia)    HTN (hypertension)    Hypothyroidism    Macular degeneration, bilateral    Varicosities    Past Surgical History:  Procedure Laterality Date   CATARACT EXTRACTION W/PHACO Right 02/03/2017   Procedure: CATARACT EXTRACTION PHACO AND INTRAOCULAR LENS PLACEMENT (IOC);  Surgeon: Galen Manila, MD;  Location: [facility] ORS;  Service: Ophthalmology;  Laterality: Right;  [place] 00:57.2 AP% 18.5 CDE 10.58 Fluid pack lot # [id number]   CATARACT EXTRACTION W/PHACO Left 02/24/2017   Procedure: CATARACT EXTRACTION PHACO AND INTRAOCULAR LENS PLACEMENT (IOC);  Surgeon: Galen Manila, MD;  Location: [facility] ORS;  Service: Ophthalmology;  Laterality: Left;  [place] 01:12 AP% 25.0 CDE 18.03 Fluid pack lot # [id number]   MOHS SURGERY  2015   nose   SALIVARY GLAND SURGERY Left 2012   TONSILLECTOMY  1940    reports that she  quit smoking about 18 years ago. Her smoking use included cigarettes. She has never used smokeless tobacco. She reports that she does not currently use alcohol. She reports that she does not use drugs. Social History   Socioeconomic History   Marital status: Widowed    Spouse name: Not on file   Number of children: Not on file   Years of education: Not on file   Highest education level: Not on file  Occupational History   Not  on file  Tobacco Use   Smoking status: Former    Current packs/day: 0.00    Types: Cigarettes    Quit date: 09/05/2005    Years since quitting: 18.1   Smokeless tobacco: Never  Vaping Use   Vaping status: Never Used  Substance and Sexual Activity   Alcohol use: Not Currently   Drug use: No   Sexual activity: Not Currently    Birth control/protection: Post-menopausal  Other Topics Concern   Not on file  Social History Narrative   Not on file   Social Drivers of Health   Financial Resource Strain: Not on file  Food Insecurity: No Food Insecurity (10/18/2023)   Hunger Vital Sign    Worried About Running Out of Food in the Last Year: Never true    Ran Out of Food in the Last Year: Never true  Transportation Needs: No Transportation Needs (10/18/2023)   PRAPARE - Administrator, Civil Service (Medical): No    Lack of Transportation (Non-Medical): No  Physical Activity: Not on file  Stress: Not on file  Social Connections: Socially Isolated (10/18/2023)   Social Connection and Isolation Panel [NHANES]    Frequency of Communication with Friends and Family: More than three times a week    Frequency of Social Gatherings with Friends and Family: Twice a week    Attends Religious Services: Never    Database administrator or Organizations: No    Attends Banker Meetings: Never    Marital Status: Widowed  Intimate Partner Violence: Not At Risk (10/18/2023)   Humiliation, Afraid, Rape, and Kick questionnaire    Fear of Current or Ex-Partner: No    Emotionally Abused: No    Physically Abused: No    Sexually Abused: No    Functional Status Survey:    Family History  Problem Relation Age of Onset   Diverticulitis Mother    Macular degeneration Mother    COPD Father    Heart failure Father    Epilepsy Sister    Parkinson's disease Brother    Cancer Neg Hx    Diabetes Neg Hx    Breast cancer Neg Hx     Health Maintenance  Topic Date Due   DTaP/Tdap/Td  (1 - Tdap) Never done   DEXA SCAN  Never done   COVID-19 Vaccine (1) 06/12/2020   Medicare Annual Wellness (AWV)  07/08/2024   Pneumonia Vaccine 23+ Years old  Completed   INFLUENZA VACCINE  Completed   Zoster Vaccines- Shingrix  Completed   HPV VACCINES  Aged Out    Allergies  Allergen Reactions   Gentamicin    Levofloxacin Diarrhea    Outpatient Encounter Medications as of 10/23/2023  Medication Sig   acetaminophen (TYLENOL) 325 MG tablet Take 650 mg by mouth every 4 (four) hours as needed.   albuterol (VENTOLIN HFA) 108 (90 Base) MCG/ACT inhaler Inhale 2 puffs into the lungs every 4 (four) hours as needed for wheezing or shortness of breath.  amiodarone (PACERONE) 200 MG tablet Take 1 tablet (200 mg total) by mouth 2 (two) times daily for 14 days, THEN 1 tablet (200 mg total) daily.   apixaban (ELIQUIS) 2.5 MG TABS tablet Take 1 tablet (2.5 mg total) by mouth 2 (two) times daily.   ARIPiprazole (ABILIFY) 2 MG tablet Take 1 tablet (2 mg total) by mouth daily with breakfast.   dapagliflozin propanediol (FARXIGA) 10 MG TABS tablet Take 1 tablet (10 mg total) by mouth daily.   doxycycline (VIBRA-TABS) 100 MG tablet Take 1 tablet (100 mg total) by mouth every 12 (twelve) hours for 2 days.   erythromycin ophthalmic ointment Place 1 Application into both eyes at bedtime.   estradiol (ESTRACE) 0.1 MG/GM vaginal cream Place 1 Applicatorful vaginally 2 (two) times a week.   Multiple Vitamins-Minerals (PRESERVISION AREDS) TABS Take 1 capsule by mouth 2 (two) times daily.   nystatin (MYCOSTATIN/NYSTOP) powder Apply 1 Application topically 2 (two) times daily.   Omega-3 Fatty Acids (FISH OIL CONCENTRATE PO) Take 1 tablet by mouth daily.    OXYGEN 2 lpm for dyspnea or SOB   Polyvinyl Alcohol-Povidone PF (REFRESH) 1.4-0.6 % SOLN Place 1 drop into both eyes in the morning and at bedtime.   pravastatin (PRAVACHOL) 20 MG tablet Take 1 tablet (20 mg total) by mouth daily at 6 PM.    aluminum-magnesium hydroxide 200-200 MG/5ML suspension Take 5 mLs by mouth every 4 (four) hours as needed for indigestion. (Patient not taking: Reported on 10/23/2023)   bismuth subsalicylate (PEPTO BISMOL) 262 MG/15ML suspension Take 30 mLs by mouth as needed. (Patient not taking: Reported on 10/23/2023)   dextromethorphan-guaiFENesin (ROBITUSSIN-DM) 10-100 MG/5ML liquid Take 5 mLs by mouth every 4 (four) hours as needed for cough. (Patient not taking: Reported on 10/23/2023)   dextrose (GLUTOSE) 40 % GEL Take 1 Tube by mouth as needed for low blood sugar. (Patient not taking: Reported on 10/23/2023)   magnesium hydroxide (MILK OF MAGNESIA) 400 MG/5ML suspension Take 5 mLs by mouth daily as needed for mild constipation. (Patient not taking: Reported on 10/23/2023)   No facility-administered encounter medications on file as of 10/23/2023.    Review of Systems  Vitals:   10/23/23 0816  BP: 109/68  Pulse: (!) 108  Temp: 97.9 F (36.6 C)  SpO2: 98%  Weight: 99 lb 12.8 oz (45.3 kg)  Height: 5' (1.524 m)   Body mass index is 19.49 kg/m. Physical Exam Constitutional:      Appearance: Normal appearance.     Comments: thin  Cardiovascular:     Rate and Rhythm: Normal rate. Rhythm irregular.     Pulses: Normal pulses.     Heart sounds: Normal heart sounds.  Pulmonary:     Effort: Pulmonary effort is normal.     Breath sounds: Wheezing present.     Comments: 2.5 LNC Abdominal:     General: Abdomen is flat. Bowel sounds are normal.     Palpations: Abdomen is soft.  Musculoskeletal:        General: No swelling or tenderness.  Skin:    General: Skin is warm and dry.  Neurological:     Mental Status: She is alert and oriented to person, place, and time.     Gait: Gait normal.  Psychiatric:        Mood and Affect: Mood normal.     Labs reviewed: Basic Metabolic Panel: Recent Labs    10/17/23 1638 10/17/23 1930 10/19/23 0628 10/20/23 0600 10/21/23 0340  NA  --    < >  130* 128*  132*  K  --    < > 4.5 4.4 4.5  CL  --    < > 94* 92* 99  CO2  --    < > 26 27 28   GLUCOSE  --    < > 148* 145* 144*  BUN  --    < > 41* 49* 59*  CREATININE  --    < > 1.06* 0.83 0.94  CALCIUM  --    < > 8.4* 8.6* 8.8*  MG 2.3  --   --   --   --   PHOS 3.2  --   --   --   --    < > = values in this interval not displayed.   Liver Function Tests: Recent Labs    11/03/22 0000 07/02/23 0000 10/17/23 1502  AST 19 18 30   ALT 18 13 39  ALKPHOS 74 76 70  BILITOT  --   --  0.9  PROT  --   --  6.4*  ALBUMIN 4.2 4.0 3.2*   No results for input(s): "LIPASE", "AMYLASE" in the last 8760 hours. No results for input(s): "AMMONIA" in the last 8760 hours. CBC: Recent Labs    11/03/22 0000 07/02/23 0000 10/17/23 1502 10/18/23 0250 10/19/23 0628 10/20/23 0600  WBC 6.5 7.5   < > 11.4* 13.3* 11.3*  NEUTROABS 5,116.00 6,165.00  --   --   --   --   HGB 12.1 11.7*   < > 11.1* 10.5* 10.3*  HCT 36 36   < > 33.9* 31.4* 30.5*  MCV  --   --    < > 98.5 96.6 97.1  PLT 310 252   < > 208 238 231   < > = values in this interval not displayed.   Cardiac Enzymes: No results for input(s): "CKTOTAL", "CKMB", "CKMBINDEX", "TROPONINI" in the last 8760 hours. BNP: Invalid input(s): "POCBNP" Lab Results  Component Value Date   HGBA1C 5.6 11/03/2022   Lab Results  Component Value Date   TSH 0.968 10/17/2023   Lab Results  Component Value Date   VITAMINB12 1,225 (H) 10/10/2020   No results found for: "FOLATE" No results found for: "IRON", "TIBC", "FERRITIN"  Imaging and Procedures obtained prior to SNF admission: ECHOCARDIOGRAM COMPLETE Result Date: 10/18/2023    ECHOCARDIOGRAM REPORT   Patient Name:   Angela Mason Date of Exam: 10/18/2023 Medical Rec #:  [id number]       Height:       60.0 in Accession #:    [id number]      Weight:       95.0 lb Date of Birth:  02-15-1933       BSA:          1.360 m Patient Age:    89 years        BP:           118/83 mmHg Patient Gender: F               HR:            116 bpm. Exam Location:  [facility] Procedure: 2D Echo, Cardiac Doppler and Color Doppler (Both Spectral and Color            Flow Doppler were utilized during procedure). Indications:     CHF- Acute Diastolic I50.31  History:         Patient has no prior history of Echocardiogram examinations.  Sonographer:  Elwin Sleight RDCS Referring Phys:  [id number] Brien Few NIU Diagnosing Phys: Adrian Blackwater  Sonographer Comments: Image acquisition challenging due to respiratory motion. IMPRESSIONS  1. Left ventricular ejection fraction, by estimation, is 45 to 50%. The left ventricle has mildly decreased function. The left ventricle demonstrates global hypokinesis. The left ventricular internal cavity size was moderately dilated. There is mild concentric left ventricular hypertrophy. Left ventricular diastolic parameters are indeterminate.  2. Right ventricular systolic function is moderately reduced. The right ventricular size is moderately enlarged. Mildly increased right ventricular wall thickness.  3. Left atrial size was severely dilated.  4. Right atrial size was severely dilated.  5. The mitral valve is grossly normal. Moderate mitral valve regurgitation.  6. Tricuspid valve regurgitation is mild to moderate.  7. The aortic valve is calcified. Aortic valve regurgitation is not visualized. Aortic valve sclerosis/calcification is present, without any evidence of aortic stenosis. FINDINGS  Left Ventricle: Left ventricular ejection fraction, by estimation, is 45 to 50%. The left ventricle has mildly decreased function. The left ventricle demonstrates global hypokinesis. Strain was performed and the global longitudinal strain is indeterminate. The left ventricular internal cavity size was moderately dilated. There is mild concentric left ventricular hypertrophy. Left ventricular diastolic parameters are indeterminate. Right Ventricle: The right ventricular size is moderately enlarged. Mildly increased right ventricular wall  thickness. Right ventricular systolic function is moderately reduced. Left Atrium: Left atrial size was severely dilated. Right Atrium: Right atrial size was severely dilated. Pericardium: There is no evidence of pericardial effusion. Mitral Valve: The mitral valve is grossly normal. Moderate mitral valve regurgitation. Tricuspid Valve: The tricuspid valve is grossly normal. Tricuspid valve regurgitation is mild to moderate. Aortic Valve: The aortic valve is calcified. Aortic valve regurgitation is not visualized. Aortic valve sclerosis/calcification is present, without any evidence of aortic stenosis. Aortic valve peak gradient measures 10.6 mmHg. Pulmonic Valve: The pulmonic valve was grossly normal. Pulmonic valve regurgitation is mild. Aorta: The aortic root, ascending aorta and aortic arch are all structurally normal, with no evidence of dilitation or obstruction. IAS/Shunts: No atrial level shunt detected by color flow Doppler. Additional Comments: 3D was performed not requiring image post processing on an independent workstation and was indeterminate.  LEFT VENTRICLE PLAX 2D LVIDd:         3.20 cm   Diastology LVIDs:         2.40 cm   LV e' lateral: 12.40 cm/s LV PW:         1.10 cm LV IVS:        1.00 cm LVOT diam:     1.90 cm LV SV:         29 LV SV Index:   22 LVOT Area:     2.84 cm  RIGHT VENTRICLE RV Basal diam:  2.80 cm LEFT ATRIUM             Index        RIGHT ATRIUM           Index LA diam:        3.50 cm 2.57 cm/m   RA Area:     17.00 cm LA Vol (A2C):   32.7 ml 24.04 ml/m  RA Volume:   48.00 ml  35.29 ml/m LA Vol (A4C):   39.2 ml 28.82 ml/m LA Biplane Vol: 39.8 ml 29.26 ml/m  AORTIC VALVE                 PULMONIC VALVE AV Area (Vmax): 1.29 cm  PV Vmax:        0.64 m/s AV Vmax:        162.50 cm/s  PV Peak grad:   1.6 mmHg AV Peak Grad:   10.6 mmHg    RVOT Peak grad: 1 mmHg LVOT Vmax:      73.90 cm/s LVOT Vmean:     50.100 cm/s LVOT VTI:       0.104 m  AORTA Ao Root diam: 2.80 cm TRICUSPID  VALVE TR Peak grad:   33.6 mmHg TR Vmax:        290.00 cm/s  SHUNTS Systemic VTI:  0.10 m Systemic Diam: 1.90 cm Adrian Blackwater Electronically signed by Adrian Blackwater Signature Date/Time: 10/18/2023/12:36:21 PM    Final     Assessment/Plan Chronic Obstructive Pulmonary Disease (COPD) COPD exacerbation leading to hospitalization. Non-compliance with inhaler use despite previous recommendations. Wheezing indicates ongoing respiratory issues. Reports no significant dyspnea while seated but symptomatic with activity. - Prescribe albuterol inhaler for wheezing, especially before physical therapy sessions. - Educate on the importance of inhaler use to prevent exacerbations.  COPD Exacerbation Recent exacerbation managed with antibiotics and steroids. Currently on doxycycline to reduce inflammation. - Continue doxycycline for exacerbation management.  Atrial Fibrillation New onset atrial fibrillation diagnosed during hospitalization. Increased stroke risk necessitates medication for rhythm control and thromboembolism prevention. Amiodarone and Eliquis prescribed. - Prescribe amiodarone for rhythm control. - Prescribe Eliquis (apixaban) to reduce stroke risk.  Heart Failure Heart failure with reduced ejection fraction, managed with Farxiga to improve cardiac function. - Continue Farxiga for cardiac function improvement.  Goals of Care Discussion on code status and end-of-life preferences. Currently undecided, wishes to consult with daughter. Explained resuscitation implications, including potential complications such as rib fractures and ventilator dependency. Emphasized aligning care with values and future health planning. - Discuss code status and end-of-life preferences with daughter. - Facilitate conversation with healthcare team to finalize decisions.  Major Depression Stable at this time. Improved with medication adherence, Continue Abilify  Protein Deficiency, severe Weight loss with  diuresis as well as poor PO intake. Consider restating mirtazapine. Previously stable on 15 mg dosing. Protein supplementation as tolerated.   Follow-up Currently in rehabilitation facility post-hospitalization. Requires monitoring of new medications and oxygen therapy adjustments. Early rehabilitation emphasized for functional improvement, with potential significant progress in first three weeks. - Monitor and adjust oxygen therapy, targeting 1 liter at rest and 2 liters during activity. - Review medication regimen for understanding and compliance. - Schedule follow-up visits on Monday, Wednesday, and Friday for progress monitoring and treatment adjustments.  Family/ staff Communication: Nursing, Daughter HCPOA  Labs/tests ordered: CBC, BMP

## 2023-10-24 ENCOUNTER — Encounter: Payer: Self-pay | Admitting: Student

## 2023-10-26 LAB — BASIC METABOLIC PANEL WITH GFR
BUN: 28 — AB (ref 4–21)
CO2: 38 — AB (ref 13–22)
Chloride: 101 (ref 99–108)
Creatinine: 0.7 (ref 0.5–1.1)
Glucose: 92
Potassium: 4.3 meq/L (ref 3.5–5.1)
Sodium: 145 (ref 137–147)

## 2023-10-26 LAB — CBC AND DIFFERENTIAL
HCT: 35 — AB (ref 36–46)
Hemoglobin: 11 — AB (ref 12.0–16.0)
Neutrophils Absolute: 6304
Platelets: 260 10*3/uL (ref 150–400)
WBC: 8

## 2023-10-26 LAB — COMPREHENSIVE METABOLIC PANEL WITH GFR
Calcium: 8.5 — AB (ref 8.7–10.7)
eGFR: 84

## 2023-10-26 LAB — CBC: RBC: 3.46 — AB (ref 3.87–5.11)

## 2023-10-26 NOTE — Addendum Note (Signed)
 Addended by: [name] on: 10/26/2023 10:22 AM   Modules accepted: Orders

## 2023-10-27 ENCOUNTER — Ambulatory Visit: Admitting: Obstetrics and Gynecology

## 2023-10-28 ENCOUNTER — Inpatient Hospital Stay
Admission: EM | Admit: 2023-10-28 | Discharge: 2023-10-31 | DRG: 291 | Disposition: A | Discharge status: Skilled Nursing Facility | Source: Skilled Nursing Facility | Attending: Internal Medicine | Admitting: Internal Medicine

## 2023-10-28 ENCOUNTER — Other Ambulatory Visit: Payer: Self-pay

## 2023-10-28 ENCOUNTER — Emergency Department

## 2023-10-28 ENCOUNTER — Emergency Department
Admit: 2023-10-28 | Discharge: 2023-10-28 | Disposition: A | Discharge status: Home / Self Care | Attending: Internal Medicine | Admitting: Internal Medicine

## 2023-10-28 ENCOUNTER — Encounter: Payer: Self-pay | Admitting: Emergency Medicine

## 2023-10-28 ENCOUNTER — Encounter
Admission: EM | Disposition: A | Discharge status: Skilled Nursing Facility | Payer: Self-pay | Source: Skilled Nursing Facility | Attending: Internal Medicine

## 2023-10-28 DIAGNOSIS — E785 Hyperlipidemia, unspecified: Secondary | ICD-10-CM | POA: Diagnosis present

## 2023-10-28 DIAGNOSIS — I5021 Acute systolic (congestive) heart failure: Secondary | ICD-10-CM | POA: Diagnosis not present

## 2023-10-28 DIAGNOSIS — Z681 Body mass index (BMI) 19 or less, adult: Secondary | ICD-10-CM

## 2023-10-28 DIAGNOSIS — E8729 Other acidosis: Secondary | ICD-10-CM | POA: Diagnosis present

## 2023-10-28 DIAGNOSIS — Z881 Allergy status to other antibiotic agents status: Secondary | ICD-10-CM

## 2023-10-28 DIAGNOSIS — H3530 Unspecified macular degeneration: Secondary | ICD-10-CM | POA: Diagnosis present

## 2023-10-28 DIAGNOSIS — Z515 Encounter for palliative care: Secondary | ICD-10-CM | POA: Diagnosis not present

## 2023-10-28 DIAGNOSIS — Z7951 Long term (current) use of inhaled steroids: Secondary | ICD-10-CM

## 2023-10-28 DIAGNOSIS — Z85828 Personal history of other malignant neoplasm of skin: Secondary | ICD-10-CM

## 2023-10-28 DIAGNOSIS — R000 Tachycardia, unspecified: Secondary | ICD-10-CM | POA: Diagnosis present

## 2023-10-28 DIAGNOSIS — I110 Hypertensive heart disease with heart failure: Secondary | ICD-10-CM | POA: Diagnosis present

## 2023-10-28 DIAGNOSIS — I5023 Acute on chronic systolic (congestive) heart failure: Secondary | ICD-10-CM | POA: Diagnosis present

## 2023-10-28 DIAGNOSIS — E440 Moderate protein-calorie malnutrition: Secondary | ICD-10-CM | POA: Diagnosis present

## 2023-10-28 DIAGNOSIS — J9601 Acute respiratory failure with hypoxia: Secondary | ICD-10-CM | POA: Diagnosis present

## 2023-10-28 DIAGNOSIS — J441 Chronic obstructive pulmonary disease with (acute) exacerbation: Secondary | ICD-10-CM | POA: Diagnosis present

## 2023-10-28 DIAGNOSIS — Z1152 Encounter for screening for COVID-19: Secondary | ICD-10-CM | POA: Diagnosis not present

## 2023-10-28 DIAGNOSIS — Z8249 Family history of ischemic heart disease and other diseases of the circulatory system: Secondary | ICD-10-CM

## 2023-10-28 DIAGNOSIS — J9602 Acute respiratory failure with hypercapnia: Secondary | ICD-10-CM | POA: Diagnosis present

## 2023-10-28 DIAGNOSIS — I480 Paroxysmal atrial fibrillation: Secondary | ICD-10-CM | POA: Diagnosis present

## 2023-10-28 DIAGNOSIS — I509 Heart failure, unspecified: Principal | ICD-10-CM

## 2023-10-28 DIAGNOSIS — E039 Hypothyroidism, unspecified: Secondary | ICD-10-CM | POA: Diagnosis present

## 2023-10-28 DIAGNOSIS — Z79899 Other long term (current) drug therapy: Secondary | ICD-10-CM | POA: Diagnosis not present

## 2023-10-28 DIAGNOSIS — Z820 Family history of epilepsy and other diseases of the nervous system: Secondary | ICD-10-CM | POA: Diagnosis not present

## 2023-10-28 DIAGNOSIS — I213 ST elevation (STEMI) myocardial infarction of unspecified site: Secondary | ICD-10-CM | POA: Diagnosis not present

## 2023-10-28 DIAGNOSIS — Z825 Family history of asthma and other chronic lower respiratory diseases: Secondary | ICD-10-CM | POA: Diagnosis not present

## 2023-10-28 DIAGNOSIS — Z87891 Personal history of nicotine dependence: Secondary | ICD-10-CM | POA: Diagnosis not present

## 2023-10-28 DIAGNOSIS — I4891 Unspecified atrial fibrillation: Secondary | ICD-10-CM | POA: Diagnosis present

## 2023-10-28 DIAGNOSIS — Z7901 Long term (current) use of anticoagulants: Secondary | ICD-10-CM

## 2023-10-28 HISTORY — DX: Unspecified systolic (congestive) heart failure: I50.20

## 2023-10-28 HISTORY — DX: Paroxysmal atrial fibrillation: I48.0

## 2023-10-28 HISTORY — DX: Chronic systolic (congestive) heart failure: I50.22

## 2023-10-28 LAB — ECHOCARDIOGRAM COMPLETE
AR max vel: 1.17 cm2
AV Area VTI: 1.24 cm2
AV Area mean vel: 1.13 cm2
AV Mean grad: 7 mmHg
AV Peak grad: 10.9 mmHg
Ao pk vel: 1.65 m/s
Area-P 1/2: 6.96 cm2
Height: 60 in
S' Lateral: 2.7 cm

## 2023-10-28 LAB — CBC WITH DIFFERENTIAL/PLATELET
Abs Immature Granulocytes: 0.33 K/uL — ABNORMAL HIGH (ref 0.00–0.07)
Basophils Absolute: 0.1 K/uL (ref 0.0–0.1)
Basophils Relative: 1 %
Eosinophils Absolute: 0.3 K/uL (ref 0.0–0.5)
Eosinophils Relative: 2 %
HCT: 42.6 % (ref 36.0–46.0)
Hemoglobin: 13 g/dL (ref 12.0–15.0)
Immature Granulocytes: 3 %
Lymphocytes Relative: 16 %
Lymphs Abs: 2.1 K/uL (ref 0.7–4.0)
MCH: 32.3 pg (ref 26.0–34.0)
MCHC: 30.5 g/dL (ref 30.0–36.0)
MCV: 105.7 fL — ABNORMAL HIGH (ref 80.0–100.0)
Monocytes Absolute: 0.5 K/uL (ref 0.1–1.0)
Monocytes Relative: 4 %
Neutro Abs: 10 K/uL — ABNORMAL HIGH (ref 1.7–7.7)
Neutrophils Relative %: 74 %
Platelets: 298 K/uL (ref 150–400)
RBC: 4.03 MIL/uL (ref 3.87–5.11)
RDW: 13.9 % (ref 11.5–15.5)
WBC: 13.3 K/uL — ABNORMAL HIGH (ref 4.0–10.5)
nRBC: 0 % (ref 0.0–0.2)

## 2023-10-28 LAB — BLOOD GAS, VENOUS
Acid-Base Excess: 4.4 mmol/L — ABNORMAL HIGH (ref 0.0–2.0)
Acid-Base Excess: 8 mmol/L — ABNORMAL HIGH (ref 0.0–2.0)
Bicarbonate: 36.2 mmol/L — ABNORMAL HIGH (ref 20.0–28.0)
Bicarbonate: 34.7 mmol/L — ABNORMAL HIGH (ref 20.0–28.0)
Delivery systems: POSITIVE
FIO2: 35 %
O2 Saturation: 68.4 %
O2 Saturation: 88.7 %
PEEP: 7 cmH2O
Patient temperature: 37
Patient temperature: 37 *Deleted
Patient temperature: 37
Pressure control: 12 cmH2O
pCO2, Ven: 97 mmHg (ref 44–60)
pCO2, Ven: 56 mmHg (ref 44–60)
pH, Ven: 7.18 — CL (ref 7.25–7.43)
pH, Ven: 7.4 (ref 7.25–7.43)
pO2, Ven: 48 mmHg — ABNORMAL HIGH (ref 32–45)
pO2, Ven: 54 mmHg — ABNORMAL HIGH (ref 32–45)

## 2023-10-28 LAB — TROPONIN I (HIGH SENSITIVITY)
Troponin I (High Sensitivity): 81 ng/L — ABNORMAL HIGH (ref <18)
Troponin I (High Sensitivity): 148 ng/L (ref <18)
Troponin I (High Sensitivity): 150 ng/L (ref <18)

## 2023-10-28 LAB — BASIC METABOLIC PANEL WITH GFR
Anion gap: 10 (ref 5–15)
BUN: 26 mg/dL — ABNORMAL HIGH (ref 8–23)
CO2: 32 mmol/L (ref 22–32)
Calcium: 8.9 mg/dL (ref 8.9–10.3)
Chloride: 98 mmol/L (ref 98–111)
Creatinine, Ser: 1.1 mg/dL — ABNORMAL HIGH (ref 0.44–1.00)
GFR, Estimated: 48 mL/min — ABNORMAL LOW (ref >60)
Glucose, Bld: 217 mg/dL — ABNORMAL HIGH (ref 70–99)
Potassium: 4.3 mmol/L (ref 3.5–5.1)
Sodium: 140 mmol/L (ref 135–145)

## 2023-10-28 LAB — APTT
aPTT: 27 s (ref 24–36)
aPTT: 93 s — ABNORMAL HIGH (ref 24–36)
aPTT: 155 s — ABNORMAL HIGH (ref 24–36)

## 2023-10-28 LAB — RESP PANEL BY RT-PCR (RSV, FLU A&B, COVID)  RVPGX2
Influenza A by PCR: NEGATIVE
Influenza B by PCR: NEGATIVE
Resp Syncytial Virus by PCR: NEGATIVE
SARS Coronavirus 2 by RT PCR: NEGATIVE

## 2023-10-28 LAB — BRAIN NATRIURETIC PEPTIDE: B Natriuretic Peptide: 1228 pg/mL — ABNORMAL HIGH (ref 0.0–100.0)

## 2023-10-28 LAB — HEPARIN LEVEL (UNFRACTIONATED): Heparin Unfractionated: >1.1 [IU]/mL — ABNORMAL HIGH (ref 0.30–0.70)

## 2023-10-28 LAB — PROCALCITONIN: Procalcitonin: <0.1 ng/mL

## 2023-10-28 LAB — PROTIME-INR
INR: 1.2 (ref 0.8–1.2)
Prothrombin Time: 15.6 s — ABNORMAL HIGH (ref 11.4–15.2)

## 2023-10-28 SURGERY — CORONARY/GRAFT ACUTE MI REVASCULARIZATION
Anesthesia: Moderate Sedation

## 2023-10-28 MED ORDER — METHYLPREDNISOLONE SODIUM SUCC 40 MG IJ SOLR
40.0000 mg | Freq: Two times a day (BID) | INTRAMUSCULAR | Status: AC
  Administered 2023-10-28 (×2): 40 mg via INTRAVENOUS
  Filled 2023-10-28 (×2): qty 1

## 2023-10-28 MED ORDER — METHYLPREDNISOLONE SODIUM SUCC 125 MG IJ SOLR
125.0000 mg | Freq: Once | INTRAMUSCULAR | Status: AC
  Administered 2023-10-28: 125 mg via INTRAVENOUS
  Filled 2023-10-28: qty 2

## 2023-10-28 MED ORDER — PREDNISONE 20 MG PO TABS
40.0000 mg | ORAL_TABLET | Freq: Every day | ORAL | Status: DC
  Administered 2023-10-29 – 2023-10-31 (×3): 40 mg via ORAL
  Filled 2023-10-28 (×3): qty 2

## 2023-10-28 MED ORDER — IPRATROPIUM BROMIDE HFA 17 MCG/ACT IN AERS
1.0000 | INHALATION_SPRAY | Freq: Three times a day (TID) | RESPIRATORY_TRACT | 2 refills | Status: DC
Start: 2023-10-28 — End: 2023-10-31

## 2023-10-28 MED ORDER — FUROSEMIDE 10 MG/ML IJ SOLN
40.0000 mg | Freq: Once | INTRAMUSCULAR | Status: AC
  Administered 2023-10-28: 40 mg via INTRAVENOUS
  Filled 2023-10-28: qty 4

## 2023-10-28 MED ORDER — BUDESONIDE 0.5 MG/2ML IN SUSP
2.0000 mg | Freq: Two times a day (BID) | RESPIRATORY_TRACT | Status: DC
  Administered 2023-10-28 – 2023-10-31 (×7): 2 mg via RESPIRATORY_TRACT
  Filled 2023-10-28 (×8): qty 8

## 2023-10-28 MED ORDER — ACETAMINOPHEN 325 MG PO TABS: 650.0000 mg | ORAL_TABLET | ORAL | Status: DC | PRN

## 2023-10-28 MED ORDER — IPRATROPIUM-ALBUTEROL 0.5-2.5 (3) MG/3ML IN SOLN
3.0000 mL | Freq: Once | RESPIRATORY_TRACT | Status: AC
  Administered 2023-10-28: 3 mL via RESPIRATORY_TRACT
  Filled 2023-10-28: qty 3

## 2023-10-28 MED ORDER — FLUTICASONE-SALMETEROL 45-21 MCG/ACT IN AERO: 2.0000 | INHALATION_SPRAY | Freq: Two times a day (BID) | RESPIRATORY_TRACT | 12 refills | Status: DC

## 2023-10-28 MED ORDER — NYSTATIN 100000 UNIT/GM EX POWD
1.0000 | Freq: Two times a day (BID) | CUTANEOUS | Status: DC
  Filled 2023-10-28: qty 15

## 2023-10-28 MED ORDER — NITROGLYCERIN IN D5W 200-5 MCG/ML-% IV SOLN
0.0000 ug/min | INTRAVENOUS | Status: DC
  Administered 2023-10-28: 5 ug/min via INTRAVENOUS
  Filled 2023-10-28: qty 250

## 2023-10-28 MED ORDER — DAPAGLIFLOZIN PROPANEDIOL 10 MG PO TABS
10.0000 mg | ORAL_TABLET | Freq: Every day | ORAL | Status: DC
  Administered 2023-10-28 – 2023-10-31 (×4): 10 mg via ORAL
  Filled 2023-10-28 (×4): qty 1

## 2023-10-28 MED ORDER — PRAVASTATIN SODIUM 20 MG PO TABS
20.0000 mg | ORAL_TABLET | Freq: Every day | ORAL | Status: DC
  Administered 2023-10-28 – 2023-10-30 (×3): 20 mg via ORAL
  Filled 2023-10-28 (×3): qty 1

## 2023-10-28 MED ORDER — ASPIRIN 81 MG PO TBEC
81.0000 mg | DELAYED_RELEASE_TABLET | Freq: Every day | ORAL | Status: DC
  Administered 2023-10-29 – 2023-10-30 (×2): 81 mg via ORAL
  Filled 2023-10-28 (×2): qty 1

## 2023-10-28 MED ORDER — ERYTHROMYCIN 5 MG/GM OP OINT
1.0000 | TOPICAL_OINTMENT | Freq: Every day | OPHTHALMIC | Status: DC
  Administered 2023-10-29: 1 via OPHTHALMIC
  Filled 2023-10-28: qty 1

## 2023-10-28 MED ORDER — FUROSEMIDE 10 MG/ML IJ SOLN
40.0000 mg | Freq: Two times a day (BID) | INTRAMUSCULAR | Status: DC
  Administered 2023-10-29: 40 mg via INTRAVENOUS
  Filled 2023-10-28: qty 4

## 2023-10-28 MED ORDER — AMIODARONE HCL IN DEXTROSE 360-4.14 MG/200ML-% IV SOLN
60.0000 mg/h | INTRAVENOUS | Status: AC
  Administered 2023-10-28: 60 mg/h via INTRAVENOUS
  Filled 2023-10-28: qty 200

## 2023-10-28 MED ORDER — BUDESONIDE 0.25 MG/2ML IN SUSP: 0.2500 mg | Freq: Two times a day (BID) | RESPIRATORY_TRACT | 2 refills | Status: DC

## 2023-10-28 MED ORDER — ARIPIPRAZOLE 2 MG PO TABS
2.0000 mg | ORAL_TABLET | Freq: Every day | ORAL | Status: DC
  Administered 2023-10-29 – 2023-10-31 (×3): 2 mg via ORAL
  Filled 2023-10-28 (×3): qty 1

## 2023-10-28 MED ORDER — AMIODARONE LOAD VIA INFUSION
150.0000 mg | Freq: Once | INTRAVENOUS | Status: AC
  Administered 2023-10-28: 150 mg via INTRAVENOUS
  Filled 2023-10-28: qty 83.34

## 2023-10-28 MED ORDER — AMIODARONE HCL IN DEXTROSE 360-4.14 MG/200ML-% IV SOLN
30.0000 mg/h | INTRAVENOUS | Status: AC
  Administered 2023-10-28 – 2023-10-29 (×3): 30 mg/h via INTRAVENOUS
  Filled 2023-10-28 (×3): qty 200

## 2023-10-28 MED ORDER — IPRATROPIUM BROMIDE 0.02 % IN SOLN
0.5000 mg | Freq: Four times a day (QID) | RESPIRATORY_TRACT | Status: DC
  Administered 2023-10-28 – 2023-10-30 (×9): 0.5 mg via RESPIRATORY_TRACT
  Filled 2023-10-28 (×9): qty 2.5

## 2023-10-28 MED ORDER — HEPARIN (PORCINE) 25000 UT/250ML-% IV SOLN
450.0000 [IU]/h | INTRAVENOUS | Status: DC
  Administered 2023-10-28: 600 [IU]/h via INTRAVENOUS
  Filled 2023-10-28: qty 250

## 2023-10-28 MED ORDER — MOMETASONE FURO-FORMOTEROL FUM 200-5 MCG/ACT IN AERO
2.0000 | INHALATION_SPRAY | Freq: Two times a day (BID) | RESPIRATORY_TRACT | Status: DC
  Administered 2023-10-28 – 2023-10-31 (×7): 2 via RESPIRATORY_TRACT
  Filled 2023-10-28: qty 8.8

## 2023-10-28 MED ORDER — ASPIRIN 300 MG RE SUPP
300.0000 mg | Freq: Once | RECTAL | Status: AC
  Administered 2023-10-28: 300 mg via RECTAL
  Filled 2023-10-28: qty 1

## 2023-10-28 MED ORDER — LEVALBUTEROL HCL 0.63 MG/3ML IN NEBU
0.6300 mg | INHALATION_SOLUTION | Freq: Four times a day (QID) | RESPIRATORY_TRACT | Status: DC | PRN
  Administered 2023-10-29: 0.63 mg via RESPIRATORY_TRACT
  Filled 2023-10-28: qty 3

## 2023-10-28 NOTE — ED Notes (Signed)
 Pt taken off Bi-pap. Placed on 3L at this time.

## 2023-10-28 NOTE — Progress Notes (Signed)
*  PRELIMINARY RESULTS* Echocardiogram 2D Echocardiogram has been performed.  Angela Mason 10/28/2023, 8:04 AM

## 2023-10-28 NOTE — ED Notes (Signed)
 CRITICAL VALUE STICKER  CRITICAL VALUE: Trop  DATE & TIME NOTIFIED: 10/28/2023 0850  MD NOTIFIED: Dr. Melynda Keller  TIME OF NOTIFICATION: [id number]  RESPONSE:  In department

## 2023-10-28 NOTE — ED Notes (Signed)
 Pt placed on bipap at this time.

## 2023-10-28 NOTE — Progress Notes (Signed)
 ANTICOAGULATION CONSULT NOTE  Pharmacy Consult for heparin infusion Indication: ACS / STEMI  Allergies  Allergen Reactions   Gentamicin    Levofloxacin Diarrhea    Patient Measurements: Height: 5' (152.4 cm) IBW/kg (Calculated) : 45.5 Heparin Dosing Weight: 45.3 kg  Vital Signs: Temp: 98.8 F (37.1 C) (04/02 0519) Temp Source: Rectal (04/02 0519) BP: 144/86 (04/02 0540) Pulse Rate: 104 (04/02 0540)  Labs: Recent Labs    10/28/23 0500  HGB 13.0  HCT 42.6  PLT 298  CREATININE 1.10*  TROPONINIHS 81*    Estimated Creatinine Clearance: 24.8 mL/min (A) (by C-G formula based on SCr of 1.1 mg/dL (H)).   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   Pt recently started on Eliquis on 10/19/23 for A fib during prior admission.  Goal of Therapy:  Heparin level 0.3-0.7 units/ml aPTT 66-102 seconds Monitor platelets by anticoagulation protocol: Yes   Plan:  Will not give initial bolus During prior admission, pt heparin level was therapeutic at 600 unit/hr Restarting heparin infusion at 600 units/hr Will follow aPTT until correlation w/ HL confirmed Will check aPTT in 8 hr after start of infusion HL & CBC daily while on heparin  Otelia Sergeant, PharmD, [facility] 10/28/2023 6:03 AM

## 2023-10-28 NOTE — ED Provider Notes (Signed)
 [facility] Provider Note    Event Date/Time   First MD Initiated Contact with Patient 10/28/23 0448     (approximate)   History   Code STEMI   HPI  Angela Mason is a 89 y.o. female with history of CAD PD, CHF, atrial fibrillation on Eliquis, hypertension, hyperlipidemia who presents to the emergency department from [facility] skilled nursing facility with shortness of breath, increased work of breathing.  Patient wears 1 L of oxygen chronically.  Was found to have sats in the low 80s.  Placed on nonrebreather with fire department and sats improved.  Then placed on CPAP with EMS.  Was given 1 breathing treatment.  EKG was concerning for ST elevation in the anterior leads.  EMS placed an inch of Nitropaste and activated a code STEMI.  Patient denies any chest pain or chest discomfort.  No fevers or cough.  She is a full code.  Oxygen saturation currently 83% on CPAP.   History provided by patient, EMS.    Past Medical History:  Diagnosis Date   Cancer (HCC)    SKIN   COPD, mild (HCC)    Dyspnea    Edema    Heart failure with mildly reduced ejection fraction (HFmrEF) (HCC)    HLD (hyperlipidemia)    HTN (hypertension)    Hypothyroidism    Macular degeneration, bilateral    Paroxysmal atrial fibrillation (HCC)    Varicosities     Past Surgical History:  Procedure Laterality Date   CATARACT EXTRACTION W/PHACO Right 02/03/2017   Procedure: CATARACT EXTRACTION PHACO AND INTRAOCULAR LENS PLACEMENT (IOC);  Surgeon: Galen Manila, MD;  Location: [facility] ORS;  Service: Ophthalmology;  Laterality: Right;  [place] 00:57.2 AP% 18.5 CDE 10.58 Fluid pack lot # [id number]   CATARACT EXTRACTION W/PHACO Left 02/24/2017   Procedure: CATARACT EXTRACTION PHACO AND INTRAOCULAR LENS PLACEMENT (IOC);  Surgeon: Galen Manila, MD;  Location: [facility] ORS;  Service: Ophthalmology;  Laterality: Left;  [place] 01:12 AP% 25.0 CDE 18.03 Fluid pack lot # [id number]   MOHS SURGERY   2015   nose   SALIVARY GLAND SURGERY Left 2012   TONSILLECTOMY  1940    MEDICATIONS:  Prior to Admission medications   Medication Sig Start Date End Date Taking? Authorizing Provider  acetaminophen (TYLENOL) 325 MG tablet Take 650 mg by mouth every 4 (four) hours as needed.    [provider]  albuterol (VENTOLIN HFA) 108 (90 Base) MCG/ACT inhaler Inhale 2 puffs into the lungs every 4 (four) hours as needed for wheezing or shortness of breath. 10/21/23   Leeroy Bock, MD  amiodarone (PACERONE) 200 MG tablet Take 1 tablet (200 mg total) by mouth 2 (two) times daily for 14 days, THEN 1 tablet (200 mg total) daily. 10/21/23 12/04/23  Leeroy Bock, MD  apixaban (ELIQUIS) 2.5 MG TABS tablet Take 1 tablet (2.5 mg total) by mouth 2 (two) times daily. 10/21/23   Leeroy Bock, MD  ARIPiprazole (ABILIFY) 2 MG tablet Take 1 tablet (2 mg total) by mouth daily with breakfast. 01/31/23   [name], MD  dapagliflozin propanediol (FARXIGA) 10 MG TABS tablet Take 1 tablet (10 mg total) by mouth daily. 10/22/23   Leeroy Bock, MD  erythromycin ophthalmic ointment Place 1 Application into both eyes at bedtime.    [provider]  estradiol (ESTRACE) 0.1 MG/GM vaginal cream Place 1 Applicatorful vaginally 2 (two) times a week. 07/02/23   Hildred Laser, MD  Multiple Vitamins-Minerals (PRESERVISION  AREDS) TABS Take 1 capsule by mouth 2 (two) times daily.    [provider]  nystatin (MYCOSTATIN/NYSTOP) powder Apply 1 Application topically 2 (two) times daily.    [provider]  Omega-3 Fatty Acids (FISH OIL CONCENTRATE PO) Take 1 tablet by mouth daily.     [provider]  OXYGEN 2 lpm for dyspnea or SOB    [provider]  Polyvinyl Alcohol-Povidone PF (REFRESH) 1.4-0.6 % SOLN Place 1 drop into both eyes in the morning and at bedtime.    [provider]  pravastatin (PRAVACHOL) 20 MG tablet Take 1 tablet (20 mg total) by mouth  daily at 6 PM. 10/21/23   Leeroy Bock, MD    Physical Exam   Triage Vital Signs: ED Triage Vitals  Encounter Vitals Group     BP 10/28/23 0455 (!) 173/91     Systolic BP Percentile --      Diastolic BP Percentile --      Pulse Rate 10/28/23 0453 (!) 102     Resp 10/28/23 0453 (!) 26     Temp --      Temp src --      SpO2 10/28/23 0453 (!) 82 %     Weight --      Height 10/28/23 0456 5' (1.524 m)     Head Circumference --      Peak Flow --      Pain Score 10/28/23 0456 0     Pain Loc --      Pain Education --      Exclude from Growth Chart --     Most recent vital signs: Vitals:   10/28/23 0600 10/28/23 0630  BP: 127/71 96/64  Pulse: 85 82  Resp: (!) 32 19  Temp:    SpO2: 100% 100%    CONSTITUTIONAL: Alert, responds appropriately to questions.  Elderly, chronically ill-appearing HEAD: Normocephalic, atraumatic EYES: Conjunctivae clear, pupils appear equal, sclera nonicteric ENT: normal nose; moist mucous membranes NECK: Supple, normal ROM CARD: Regular and tachycardic; S1 and S2 appreciated RESP: Scattered rhonchi, rales and expiratory wheezing.  Diminished aeration at bases bilaterally.  Increased work of breathing with accessory muscle use.  Speaking mildly 1-2 words at a time.  In respiratory distress.  Hypoxic on CPAP. ABD/GI: Non-distended; soft, non-tender, no rebound, no guarding, no peritoneal signs BACK: The back appears normal EXT: Normal ROM in all joints; no deformity noted, mild edema in the distal lower extremities bilaterally that is symmetric, no calf swelling or calf tenderness SKIN: Normal color for age and race; warm; no rash on exposed skin NEURO: Moves all extremities equally   ED Results / Procedures / Treatments   LABS: (all labs ordered are listed, but only abnormal results are displayed) Labs Reviewed  CBC WITH DIFFERENTIAL/PLATELET - Abnormal; Notable for the following components:      Result Value   WBC 13.3 (*)    MCV 105.7  (*)    Neutro Abs 10.0 (*)    Abs Immature Granulocytes 0.33 (*)    All other components within normal limits  BASIC METABOLIC PANEL WITH GFR - Abnormal; Notable for the following components:   Glucose, Bld 217 (*)    BUN 26 (*)    Creatinine, Ser 1.10 (*)    GFR, Estimated 48 (*)    All other components within normal limits  BRAIN NATRIURETIC PEPTIDE - Abnormal; Notable for the following components:   B Natriuretic Peptide 1,228.0 (*)  All other components within normal limits  BLOOD GAS, VENOUS - Abnormal; Notable for the following components:   pH, Ven 7.18 (*)    pCO2, Ven 97 (*)    pO2, Ven 48 (*)    Bicarbonate 36.2 (*)    Acid-Base Excess 4.4 (*)    All other components within normal limits  PROTIME-INR - Abnormal; Notable for the following components:   Prothrombin Time 15.6 (*)    All other components within normal limits  BLOOD GAS, VENOUS - Abnormal; Notable for the following components:   pH, Ven 7.22 (*)    pCO2, Ven 99 (*)    Bicarbonate 40.5 (*)    Acid-Base Excess 8.8 (*)    All other components within normal limits  HEPARIN LEVEL (UNFRACTIONATED) - Abnormal; Notable for the following components:   Heparin Unfractionated >1.10 (*)    All other components within normal limits  TROPONIN I (HIGH SENSITIVITY) - Abnormal; Notable for the following components:   Troponin I (High Sensitivity) 81 (*)    All other components within normal limits  RESP PANEL BY RT-PCR (RSV, FLU A&B, COVID)  RVPGX2  CULTURE, BLOOD (SINGLE)  CULTURE, BLOOD (SINGLE)  APTT  PROCALCITONIN  APTT  TROPONIN I (HIGH SENSITIVITY)     EKG:  EKG Interpretation Date/Time:  Wednesday October 28 2023 04:53:32 EDT Ventricular Rate:  103 PR Interval:  193 QRS Duration:  90 QT Interval:  329 QTC Calculation: 431 R Axis:   49  Text Interpretation: Sinus tachycardia Left ventricular hypertrophy ST elevation in anterior leads that could be rate related, demand ischemia, STEMI Confirmed by  Lycan Davee, Baxter Hire [phone]) on 10/28/2023 5:08:44 AM         EKG Interpretation Date/Time:  Wednesday October 28 2023 05:36:34 EDT Ventricular Rate:  100 PR Interval:  194 QRS Duration:  87 QT Interval:  370 QTC Calculation: 478 R Axis:   9  Text Interpretation: Sinus tachycardia Probable anteroseptal infarct, old Borderline T abnormalities, inferior leads Confirmed by Rochele Raring [phone]) on 10/28/2023 5:42:58 AM        RADIOLOGY: My personal review and interpretation of imaging: Chest x-ray shows diffuse pulmonary edema.  I have personally reviewed all radiology reports.   DG Chest Portable 1 View Result Date: 10/28/2023 CLINICAL DATA:  Shortness of breath. EXAM: PORTABLE CHEST 1 VIEW COMPARISON:  10/17/2023 FINDINGS: Interstitial markings are diffusely coarsened with chronic features. Interval development of interstitial and alveolar opacity in the right upper lung. Small bilateral pleural effusions suspected. The cardiopericardial silhouette is within normal limits for size. Bones are diffusely demineralized. Telemetry leads overlie the chest. IMPRESSION: 1. Interval development of interstitial and alveolar opacity in the right upper lung, suspicious for pneumonia although asymmetric edema is also a consideration. 2. Small bilateral pleural effusions. Electronically Signed   By: Kennith Center M.D.   On: 10/28/2023 05:12     PROCEDURES:  Critical Care performed: Yes, see critical care procedure note(s)   CRITICAL CARE Performed by: Baxter Hire Deontray Hunnicutt   Total critical care time: 40 minutes  Critical care time was exclusive of separately billable procedures and treating other patients.  Critical care was necessary to treat or prevent imminent or life-threatening deterioration.  Critical care was time spent personally by me on the following activities: development of treatment plan with patient and/or surrogate as well as nursing, discussions with consultants, evaluation of patient's  response to treatment, examination of patient, obtaining history from patient or surrogate, ordering and performing treatments and interventions, ordering and  review of laboratory studies, ordering and review of radiographic studies, pulse oximetry and re-evaluation of patient's condition.   Marland Kitchen1-3 Lead EKG Interpretation  Performed by: Blanche Scovell, Layla Maw, DO Authorized by: Merrie Epler, Layla Maw, DO     Interpretation: abnormal     ECG rate:  107   ECG rate assessment: tachycardic     Rhythm: sinus tachycardia     Ectopy: none     Conduction: normal       IMPRESSION / MDM / ASSESSMENT AND PLAN / ED COURSE  I reviewed the triage vital signs and the nursing notes.    Patient here with shortness of breath, hypoxia.  The patient is on the cardiac monitor to evaluate for evidence of arrhythmia and/or significant heart rate changes.   DIFFERENTIAL DIAGNOSIS (includes but not limited to):   CHF exacerbation, COPD exacerbation, pneumonia, pneumothorax, ACS, PE   Patient's presentation is most consistent with acute presentation with potential threat to life or bodily function.   PLAN: Code STEMI activated in route.  Dr. Okey Dupre with cardiology at bedside on patient arrival.  Appreciate his help.  He recommends canceling the code STEMI at this time, giving nitroglycerin infusion, diuresing and repeating EKG in 20 minutes.  Patient denies having any chest discomfort.  I suspect that there is also component of COPD present given her VBG shows significant respiratory acidosis with CO2 retention.  Will continue breathing treatments, Solu-Medrol.  Will repeat VBG in 1 hour on BiPAP to see if any improvement.  She is currently awake, alert but somewhat somnolent but is arousable to voice.  If there is any worsening in her mental status or respiratory status before this repeat VBG, plan to intubate.  Patient verbalizes that she is a full code.  Will obtain cardiac labs, COVID and flu swab.  Chest x-ray reviewed  and interpreted by myself and the radiologist and shows what appears to be asymmetric edema versus pneumonia.  Rectal temp of 98.8.  Will obtain blood cultures, procalcitonin but I suspect this is more likely volume overload.  Will hold on IV fluids and antibiotics at this time given we are diuresing her.   MEDICATIONS GIVEN IN ED: Medications  nitroGLYCERIN 50 mg in dextrose 5 % 250 mL (0.2 mg/mL) infusion (0 mcg/min Intravenous Stopped 10/28/23 0703)  heparin ADULT infusion 100 units/mL (25000 units/229mL) (600 Units/hr Intravenous New Bag/Given 10/28/23 0622)  aspirin suppository 300 mg (300 mg Rectal Given 10/28/23 0520)  furosemide (LASIX) injection 40 mg (40 mg Intravenous Given 10/28/23 0527)  ipratropium-albuterol (DUONEB) 0.5-2.5 (3) MG/3ML nebulizer solution 3 mL (3 mLs Nebulization Given 10/28/23 0529)  methylPREDNISolone sodium succinate (SOLU-MEDROL) 125 mg/2 mL injection 125 mg (125 mg Intravenous Given 10/28/23 0522)     ED COURSE:  5:40 AM  Repeat EKG improved.  Patient's respiratory status improving clinically as well.  Now more awake, alert with eyes open, able to talk.  Still denies any pain.  Dr. Okey Dupre has ordered heparin.  He has reviewed her repeat EKG as well.  Does not meet STEMI criteria.  Family at bedside has been updated.   CONSULTS: Cardiology consulted.  Appreciate Dr. Serita Kyle help with this patient.   6:35 AM  Discussed with Betsey Holiday, NP with ICU given patient's continued respiratory acidosis although clinically she has improved significantly.  Clinically I do not think she needs intubation but will have ICU evaluate patient to see if she should be admitted to the ICU or if she would be appropriate for  stepdown with the hospitalist.  7:05 AM  Cheryll Cockayne has seen patient and will discuss with ICU team regarding plan.  Will sign out to oncoming EDP.   OUTSIDE RECORDS REVIEWED: Patient just admitted from 10/17/2023 to 10/21/2023 for acute CHF exacerbation, A-fib  with RVR, COPD exacerbation.       FINAL CLINICAL IMPRESSION(S) / ED DIAGNOSES   Final diagnoses:  Acute on chronic congestive heart failure, unspecified heart failure type (HCC)  COPD with acute exacerbation (HCC)  Acute respiratory failure with hypoxia and hypercapnia (HCC)  Respiratory acidosis     Rx / DC Orders   ED Discharge Orders     None        Note:  This document was prepared using Dragon voice recognition software and may include unintentional dictation errors.   Brayam Boeke, Layla Maw, DO 10/28/23 [phone]

## 2023-10-28 NOTE — H&P (Signed)
 History and Physical    Angela Mason NWG:[id number] DOB: 1932-10-31 DOA: 10/28/2023  PCP: [name], MD (Confirm with patient/family/NH records and if not entered, this has to be entered at [facility] point of entry) Patient coming from: SNF  I have personally briefly reviewed patient's old medical records in [facility]  Chief Complaint: Feeling better  HPI: Angela Mason is a 89 y.o. female with medical history significant of HTN, HLD, PAF on Eliquis, COPD, hypothyroidism, sent from nursing home for duration of worsening of shortness of breath.  Symptoms started yesterday, patient started to have dry cough wheezing and shortness of breath, denied any chest pain no fever or chills.  At baseline patient is fragile, she complains about occasional shortness of breath associated with walking, but denied any chest pains.  She was never diagnosed with COPD, no formal lung function test in the past.  ED Course: Tachycardia and telemonitoring showed A-fib, blood pressure was high and patient was started on nitroglycerin drip.  EKG showed ST elevation on lead V3 and 4, troponin 81, code STEMI was called.  Repeat EKG showed resolution of ST changes. Blood gas 7.2 2/99/48, patient was started on BiPAP.  Blood work showed hemoglobin 13, WBC 13, creatinine 1.1, K4.3.  Cardiology evaluated the patient and consulted STEMI code patient was started on heparin drip.  Patient was started on amiodarone drip, briefly patient heart rate come back to normal sinus however soon went back to rapid A-fib again.  Review of Systems: As per HPI otherwise 14 point review of systems negative.    Past Medical History:  Diagnosis Date   Cancer (HCC)    SKIN   COPD, mild (HCC)    Dyspnea    Edema    Heart failure with mildly reduced ejection fraction (HFmrEF) (HCC)    HLD (hyperlipidemia)    HTN (hypertension)    Hypothyroidism    Macular degeneration, bilateral    Paroxysmal atrial fibrillation (HCC)     Varicosities     Past Surgical History:  Procedure Laterality Date   CATARACT EXTRACTION W/PHACO Right 02/03/2017   Procedure: CATARACT EXTRACTION PHACO AND INTRAOCULAR LENS PLACEMENT (IOC);  Surgeon: Galen Manila, MD;  Location: [facility] ORS;  Service: Ophthalmology;  Laterality: Right;  [place] 00:57.2 AP% 18.5 CDE 10.58 Fluid pack lot # [id number]   CATARACT EXTRACTION W/PHACO Left 02/24/2017   Procedure: CATARACT EXTRACTION PHACO AND INTRAOCULAR LENS PLACEMENT (IOC);  Surgeon: Galen Manila, MD;  Location: [facility] ORS;  Service: Ophthalmology;  Laterality: Left;  [place] 01:12 AP% 25.0 CDE 18.03 Fluid pack lot # [id number]   MOHS SURGERY  2015   nose   SALIVARY GLAND SURGERY Left 2012   TONSILLECTOMY  1940     reports that she quit smoking about 18 years ago. Her smoking use included cigarettes. She has never used smokeless tobacco. She reports that she does not currently use alcohol. She reports that she does not use drugs.  Allergies  Allergen Reactions   Gentamicin    Levofloxacin Diarrhea    Family History  Problem Relation Age of Onset   Diverticulitis Mother    Macular degeneration Mother    COPD Father    Heart failure Father    Epilepsy Sister    Parkinson's disease Brother    Cancer Neg Hx    Diabetes Neg Hx    Breast cancer Neg Hx      Prior to Admission medications   Medication Sig Start Date End Date Taking? Authorizing Provider  budesonide (PULMICORT) 0.25 MG/2ML nebulizer solution Take 2 mLs (0.25 mg total) by nebulization 2 (two) times daily. 10/28/23 10/27/24 Yes Emeline General, MD  fluticasone-salmeterol (ADVAIR HFA) [phone] MCG/ACT inhaler Inhale 2 puffs into the lungs 2 (two) times daily. 10/28/23  Yes Mikey College T, MD  ipratropium (ATROVENT HFA) 17 MCG/ACT inhaler Inhale 1 puff into the lungs 3 (three) times daily. 10/28/23 10/27/24 Yes Emeline General, MD  acetaminophen (TYLENOL) 325 MG tablet Take 650 mg by mouth every 4 (four) hours as needed.    [provider]  albuterol (VENTOLIN HFA) 108 (90 Base) MCG/ACT inhaler Inhale 2 puffs into the lungs every 4 (four) hours as needed for wheezing or shortness of breath. 10/21/23   Leeroy Bock, MD  amiodarone (PACERONE) 200 MG tablet Take 1 tablet (200 mg total) by mouth 2 (two) times daily for 14 days, THEN 1 tablet (200 mg total) daily. 10/21/23 12/04/23  Leeroy Bock, MD  apixaban (ELIQUIS) 2.5 MG TABS tablet Take 1 tablet (2.5 mg total) by mouth 2 (two) times daily. 10/21/23   Leeroy Bock, MD  ARIPiprazole (ABILIFY) 2 MG tablet Take 1 tablet (2 mg total) by mouth daily with breakfast. 01/31/23   [name], MD  dapagliflozin propanediol (FARXIGA) 10 MG TABS tablet Take 1 tablet (10 mg total) by mouth daily. 10/22/23   Leeroy Bock, MD  erythromycin ophthalmic ointment Place 1 Application into both eyes at bedtime.    [provider]  estradiol (ESTRACE) 0.1 MG/GM vaginal cream Place 1 Applicatorful vaginally 2 (two) times a week. 07/02/23   Hildred Laser, MD  Multiple Vitamins-Minerals (PRESERVISION AREDS) TABS Take 1 capsule by mouth 2 (two) times daily.    [provider]  nystatin (MYCOSTATIN/NYSTOP) powder Apply 1 Application topically 2 (two) times daily.    [provider]  Omega-3 Fatty Acids (FISH OIL CONCENTRATE PO) Take 1 tablet by mouth daily.     [provider]  OXYGEN 2 lpm for dyspnea or SOB    [provider]  Polyvinyl Alcohol-Povidone PF (REFRESH) 1.4-0.6 % SOLN Place 1 drop into both eyes in the morning and at bedtime.    [provider]  pravastatin (PRAVACHOL) 20 MG tablet Take 1 tablet (20 mg total) by mouth daily at 6 PM. 10/21/23   Leeroy Bock, MD    Physical Exam: Vitals:   10/28/23 0710 10/28/23 0800 10/28/23 0910 10/28/23 0920  BP: 102/60 92/66 92/64  (!) 83/66  Pulse: 95 (!) 115 (!) 121 (!) 105  Resp: (!) 24 (!) 22 18 19   Temp:      TempSrc:      SpO2: 100% 99% 100% 99%  Height:         Constitutional: NAD, calm, comfortable Vitals:   10/28/23 0710 10/28/23 0800 10/28/23 0910 10/28/23 0920  BP: 102/60 92/66 92/64  (!) 83/66  Pulse: 95 (!) 115 (!) 121 (!) 105  Resp: (!) 24 (!) 22 18 19   Temp:      TempSrc:      SpO2: 100% 99% 100% 99%  Height:       Eyes: PERRL, lids and conjunctivae normal ENMT: Mucous membranes are moist. Posterior pharynx clear of any exudate or lesions.Normal dentition.  Neck: normal, supple, no masses, no thyromegaly Respiratory: clear to auscultation bilaterally, scattered wheezing, scattered crackles on bilateral lower fields, increasing respiratory effort. No accessory muscle use.  Cardiovascular: Regular rate and rhythm, no murmurs / rubs / gallops. 2+ extremity edema.  2+ pedal pulses. No carotid bruits.  Abdomen: no tenderness, no masses palpated. No hepatosplenomegaly. Bowel sounds positive.  Musculoskeletal: no clubbing / cyanosis. No joint deformity upper and lower extremities. Good ROM, no contractures. Normal muscle tone.  Skin: no rashes, lesions, ulcers. No induration Neurologic: CN 2-12 grossly intact. Sensation intact, DTR normal. Strength 5/5 in all 4.  Psychiatric: Normal judgment and insight. Alert and oriented x 3. Normal mood.     Labs on Admission: I have personally reviewed following labs and imaging studies  CBC: Recent Labs  Lab 10/28/23 0500  WBC 13.3*  NEUTROABS 10.0*  HGB 13.0  HCT 42.6  MCV 105.7*  PLT 298   Basic Metabolic Panel: Recent Labs  Lab 10/28/23 0500  NA 140  K 4.3  CL 98  CO2 32  GLUCOSE 217*  BUN 26*  CREATININE 1.10*  CALCIUM 8.9   GFR: Estimated Creatinine Clearance: 24.8 mL/min (A) (by C-G formula based on SCr of 1.1 mg/dL (H)). Liver Function Tests: No results for input(s): "AST", "ALT", "ALKPHOS", "BILITOT", "PROT", "ALBUMIN" in the last 168 hours. No results for input(s): "LIPASE", "AMYLASE" in the last 168 hours. No results for input(s): "AMMONIA" in the last 168  hours. Coagulation Profile: Recent Labs  Lab 10/28/23 0500  INR 1.2   Cardiac Enzymes: No results for input(s): "CKTOTAL", "CKMB", "CKMBINDEX", "TROPONINI" in the last 168 hours. BNP (last 3 results) No results for input(s): "PROBNP" in the last 8760 hours. HbA1C: No results for input(s): "HGBA1C" in the last 72 hours. CBG: No results for input(s): "GLUCAP" in the last 168 hours. Lipid Profile: No results for input(s): "CHOL", "HDL", "LDLCALC", "TRIG", "CHOLHDL", "LDLDIRECT" in the last 72 hours. Thyroid Function Tests: No results for input(s): "TSH", "T4TOTAL", "FREET4", "T3FREE", "THYROIDAB" in the last 72 hours. Anemia Panel: No results for input(s): "VITAMINB12", "FOLATE", "FERRITIN", "TIBC", "IRON", "RETICCTPCT" in the last 72 hours. Urine analysis:    Component Value Date/Time   COLORURINE YELLOW (A) 01/28/2022 1254   APPEARANCEUR Hazy (A) 02/25/2023 1316   LABSPEC 1.011 01/28/2022 1254   PHURINE 5.0 01/28/2022 1254   GLUCOSEU Negative 02/25/2023 1316   HGBUR MODERATE (A) 01/28/2022 1254   BILIRUBINUR Negative 02/25/2023 1316   KETONESUR NEGATIVE 01/28/2022 1254   PROTEINUR Negative 02/25/2023 1316   PROTEINUR NEGATIVE 01/28/2022 1254   UROBILINOGEN 0.2 09/19/2022 1544   NITRITE Negative 02/25/2023 1316   NITRITE POSITIVE (A) 01/28/2022 1254   LEUKOCYTESUR 2+ (A) 02/25/2023 1316   LEUKOCYTESUR LARGE (A) 01/28/2022 1254    Radiological Exams on Admission: DG Chest Portable 1 View Result Date: 10/28/2023 CLINICAL DATA:  Shortness of breath. EXAM: PORTABLE CHEST 1 VIEW COMPARISON:  10/17/2023 FINDINGS: Interstitial markings are diffusely coarsened with chronic features. Interval development of interstitial and alveolar opacity in the right upper lung. Small bilateral pleural effusions suspected. The cardiopericardial silhouette is within normal limits for size. Bones are diffusely demineralized. Telemetry leads overlie the chest. IMPRESSION: 1. Interval development of  interstitial and alveolar opacity in the right upper lung, suspicious for pneumonia although asymmetric edema is also a consideration. 2. Small bilateral pleural effusions. Electronically Signed   By: Kennith Center M.D.   On: 10/28/2023 05:12    EKG: Independently reviewed.  A-fib, ST elevation of V3 and V4, which resolved after heart rhythm came back to normal sinus  Assessment/Plan Principal Problem:   Afib (HCC) Active Problems:   A-fib (HCC)   COPD with acute exacerbation (HCC)  (please populate well all problems here in Problem List. (  For example, if patient is on BP meds at home and you resume or decide to hold them, it is a problem that needs to be her. Same for CAD, COPD, HLD and so on)  Acute hypoxic and hypercapnic respiratory failure Acute decompensated respiratory acidosis -Secondary to COPD exacerbation -Continue BiPAP -IV Solu-Medrol bridging for p.o. prednisone -ICS and LABA, sent prescription to her pharmacy at [facility] -Avoid albuterol -As needed Xopenex -Incentive spirometry and flutter valve  A-fib with RVR -Continue amiodarone drip -Electrolyte level within normal limits including K, magnesium and phosphorus -On heparin drip for elevated troponins  Elevated troponins -Troponin uptrending 81 > 148, no chest pains, code STEMI canceled.  ST-T changes on V3 and V4 was considered to related to tachycardia. -Trend troponins, etiology likely demanding ischemia from rapid A-fib and COPD exacerbation/hypoxia -Echo is pending  Acute on chronic HFpEF decompensated -Secondary to A-fib with RVR, management as above -Hydrate start patient on IV Lasix twice daily  Moderate protein calorie malnutrition -BMI 18 -Continue protein supplement  DVT prophylaxis: Heparin drip Code Status: Full code Family Communication:\Daughter at bedside Disposition Plan: Patient is sick with acute COPD exacerbation and acute CHF decompensation requiring BiPAP IV diuresis,  expect more than 2 midnight hospital stay Consults called: Cardiology Admission status: PCU admit   Emeline General MD [facility] Pager [phone]  10/28/2023, 10:39 AM

## 2023-10-28 NOTE — ED Notes (Signed)
 Family at bedside, Ward, DO is updating her at this time.

## 2023-10-28 NOTE — Consult Note (Signed)
 Cardiology Consultation   [name] MRN: [id number]; DOB: 1933-07-13  Admit date: 10/28/2023 Date of Consult: 10/28/2023  PCP:  [name], MD   [facility] Providers Cardiologist:  None        Patient Profile:   Angela Mason is a 89 y.o. female with a hx of recently diagnosed atrial fibrillation and heart failure with mildly reduced ejection fraction, as well as hypertension, hyperlipidemia, COPD, hypothyroidism, and cystocele, who is being seen 10/28/2023 for the evaluation of respiratory distress and abnormal EKG at the request of Dr. Elesa Massed.  History of Present Illness:   Angela Mason is currently in marked respiratory distress and only able to answer yes/no questions.  Most history is obtained from EMS and the patient's chart.  She was hospitalized approximately 2 weeks ago due to shortness of breath, found to be in atrial fibrillation with rapid ventricular response.  Workup during that admission was notable for LVEF of 45-50% with global hypokinesis, mild LVH, moderate RV dysfunction, severe biatrial enlargement, mild MR, and mild to moderate TR by echo.  She initially received IV diltiazem followed by amiodarone infusion with subsequent conversion to sinus rhythm.  She was discharged on oral amiodarone, apixaban, and dapagliflozin.  She was found to be in respiratory distress at her nursing home tonight, prompting EMS to be summoned.  When EMS arrived, she was wheezing and tachypneic.  She was placed on CPAP with continued oxygen saturations in the 80s.  She was also severely hypertensive, prompting application of nitroglycerin paste.  EMS EKG was concerning for ST elevation in V3 and V4, prompting activation of the STEMI team.  On my evaluation, the patient is somnolent but denies chest pain.  Her only complaint that I can elicit is shortness of breath.   Past Medical History:  Diagnosis Date   Cancer (HCC)    SKIN   COPD, mild (HCC)    Dyspnea     Edema    Heart failure with mildly reduced ejection fraction (HFmrEF) (HCC)    HLD (hyperlipidemia)    HTN (hypertension)    Hypothyroidism    Macular degeneration, bilateral    Paroxysmal atrial fibrillation (HCC)    Varicosities     Past Surgical History:  Procedure Laterality Date   CATARACT EXTRACTION W/PHACO Right 02/03/2017   Procedure: CATARACT EXTRACTION PHACO AND INTRAOCULAR LENS PLACEMENT (IOC);  Surgeon: Galen Manila, MD;  Location: [facility] ORS;  Service: Ophthalmology;  Laterality: Right;  [place] 00:57.2 AP% 18.5 CDE 10.58 Fluid pack lot # [id number]   CATARACT EXTRACTION W/PHACO Left 02/24/2017   Procedure: CATARACT EXTRACTION PHACO AND INTRAOCULAR LENS PLACEMENT (IOC);  Surgeon: Galen Manila, MD;  Location: [facility] ORS;  Service: Ophthalmology;  Laterality: Left;  [place] 01:12 AP% 25.0 CDE 18.03 Fluid pack lot # [id number]   MOHS SURGERY  2015   nose   SALIVARY GLAND SURGERY Left 2012   TONSILLECTOMY  1940     Home Medications:  Prior to Admission medications   Medication Sig Start Date Jullian Previti Date Taking? Authorizing Provider  acetaminophen (TYLENOL) 325 MG tablet Take 650 mg by mouth every 4 (four) hours as needed.    [provider]  albuterol (VENTOLIN HFA) 108 (90 Base) MCG/ACT inhaler Inhale 2 puffs into the lungs every 4 (four) hours as needed for wheezing or shortness of breath. 10/21/23   Leeroy Bock, MD  amiodarone (PACERONE) 200 MG tablet Take 1 tablet (200 mg total) by mouth 2 (two) times daily for 14  days, THEN 1 tablet (200 mg total) daily. 10/21/23 12/04/23  Leeroy Bock, MD  apixaban (ELIQUIS) 2.5 MG TABS tablet Take 1 tablet (2.5 mg total) by mouth 2 (two) times daily. 10/21/23   Leeroy Bock, MD  ARIPiprazole (ABILIFY) 2 MG tablet Take 1 tablet (2 mg total) by mouth daily with breakfast. 01/31/23   [name], MD  dapagliflozin propanediol (FARXIGA) 10 MG TABS tablet Take 1 tablet (10 mg total) by mouth daily. 10/22/23   Leeroy Bock, MD  erythromycin ophthalmic ointment Place 1 Application into both eyes at bedtime.    [provider]  estradiol (ESTRACE) 0.1 MG/GM vaginal cream Place 1 Applicatorful vaginally 2 (two) times a week. 07/02/23   Hildred Laser, MD  Multiple Vitamins-Minerals (PRESERVISION AREDS) TABS Take 1 capsule by mouth 2 (two) times daily.    [provider]  nystatin (MYCOSTATIN/NYSTOP) powder Apply 1 Application topically 2 (two) times daily.    [provider]  Omega-3 Fatty Acids (FISH OIL CONCENTRATE PO) Take 1 tablet by mouth daily.     [provider]  OXYGEN 2 lpm for dyspnea or SOB    [provider]  Polyvinyl Alcohol-Povidone PF (REFRESH) 1.4-0.6 % SOLN Place 1 drop into both eyes in the morning and at bedtime.    [provider]  pravastatin (PRAVACHOL) 20 MG tablet Take 1 tablet (20 mg total) by mouth daily at 6 PM. 10/21/23   Leeroy Bock, MD    Inpatient Medications: Scheduled Meds:  furosemide  40 mg Intravenous Once   ipratropium-albuterol  3 mL Nebulization Once   Continuous Infusions:  nitroGLYCERIN     PRN Meds:   Allergies:    Allergies  Allergen Reactions   Gentamicin    Levofloxacin Diarrhea    Social History:   Social History   Tobacco Use   Smoking status: Former    Current packs/day: 0.00    Types: Cigarettes    Quit date: 09/05/2005    Years since quitting: 18.1   Smokeless tobacco: Never  Vaping Use   Vaping status: Never Used  Substance Use Topics   Alcohol use: Not Currently   Drug use: No      Family History:   Family History  Problem Relation Age of Onset   Diverticulitis Mother    Macular degeneration Mother    COPD Father    Heart failure Father    Epilepsy Sister    Parkinson's disease Brother    Cancer Neg Hx    Diabetes Neg Hx    Breast cancer Neg Hx      ROS:  Unable to obtain due to the patient's respiratory distress and acuity of her condition.  Physical  Exam/Data:   Vitals:   10/28/23 0455 10/28/23 0456 10/28/23 0502 10/28/23 0519  BP: (!) 173/91   (!) 165/88  Pulse:    (!) 107  Resp: (!) 23   (!) 25  Temp:    98.8 F (37.1 C)  TempSrc:    Rectal  SpO2: (!) 85%  96% 100%  Height:  5' (1.524 m)     No intake or output data in the 24 hours ending 10/28/23 0526    10/23/2023    8:16 AM 10/21/2023    5:00 AM 10/20/2023    5:00 AM  Last 3 Weights  Weight (lbs) 99 lb 12.8 oz 105 lb 6.1 oz 105 lb 9.6 oz  Weight (kg) 45.269 kg 47.8 kg 47.9 kg  Body mass index is 19.49 kg/m.  General: Elderly woman, seated on ED stretcher.  She is wearing a CPAP mask and appears to be in respiratory distress. HEENT: Unable to assess due to CPAP mask. Neck: Appears to be elevated though difficult to determine due to CPAP mask strap and accessory respiratory muscle use. Vascular: 2+ radial pulses. Cardiac: Tachycardic but regular with 1/6 systolic murmur, obscured by prominent breath sounds. Lungs: Increased work of breathing noted.  Scattered wheezes and rhonchi noted as well as faint crackles in the lower halves of both lung fields. Abd: soft, nontender, no hepatomegaly  Ext: 1+ pretibial edema bilaterally. Musculoskeletal:  No deformities, BUE and BLE strength normal and equal Skin: warm and dry  Neuro: Somnolent but arousable. Psych: Unble to assess due to respiratory distress.  EKG:  The EKG was personally reviewed and demonstrates: Sinus tachycardia with LVH and ST elevation of 1 to 2 mm in V3 and V4. Telemetry:  Telemetry was personally reviewed and demonstrates: Sinus tachycardia  Relevant CV Studies: TTE (10/18/2023):  1. Left ventricular ejection fraction, by estimation, is 45 to 50%. The  left ventricle has mildly decreased function. The left ventricle  demonstrates global hypokinesis. The left ventricular internal cavity size  was moderately dilated. There is mild  concentric left ventricular hypertrophy. Left ventricular diastolic   parameters are indeterminate.   2. Right ventricular systolic function is moderately reduced. The right  ventricular size is moderately enlarged. Mildly increased right  ventricular wall thickness.   3. Left atrial size was severely dilated.   4. Right atrial size was severely dilated.   5. The mitral valve is grossly normal. Moderate mitral valve  regurgitation.   6. Tricuspid valve regurgitation is mild to moderate.   7. The aortic valve is calcified. Aortic valve regurgitation is not  visualized. Aortic valve sclerosis/calcification is present, without any  evidence of aortic stenosis.   Laboratory Data:  High Sensitivity Troponin:   Recent Labs  Lab 10/17/23 1502 10/17/23 1638 10/17/23 1930 10/17/23 2341 10/18/23 0250  TROPONINIHS 46* 60* 54* 42* 36*     ChemistryNo results for input(s): "NA", "K", "CL", "CO2", "GLUCOSE", "BUN", "CREATININE", "CALCIUM", "MG", "GFRNONAA", "GFRAA", "ANIONGAP" in the last 168 hours.  No results for input(s): "PROT", "ALBUMIN", "AST", "ALT", "ALKPHOS", "BILITOT" in the last 168 hours. Lipids No results for input(s): "CHOL", "TRIG", "HDL", "LABVLDL", "LDLCALC", "CHOLHDL" in the last 168 hours.  HematologyNo results for input(s): "WBC", "RBC", "HGB", "HCT", "MCV", "MCH", "MCHC", "RDW", "PLT" in the last 168 hours. Thyroid No results for input(s): "TSH", "FREET4" in the last 168 hours.  BNPNo results for input(s): "BNP", "PROBNP" in the last 168 hours.  DDimer No results for input(s): "DDIMER" in the last 168 hours.   Radiology/Studies:  DG Chest Portable 1 View Result Date: 10/28/2023 CLINICAL DATA:  Shortness of breath. EXAM: PORTABLE CHEST 1 VIEW COMPARISON:  10/17/2023 FINDINGS: Interstitial markings are diffusely coarsened with chronic features. Interval development of interstitial and alveolar opacity in the right upper lung. Small bilateral pleural effusions suspected. The cardiopericardial silhouette is within normal limits for size. Bones  are diffusely demineralized. Telemetry leads overlie the chest. IMPRESSION: 1. Interval development of interstitial and alveolar opacity in the right upper lung, suspicious for pneumonia although asymmetric edema is also a consideration. 2. Small bilateral pleural effusions. Electronically Signed   By: Kennith Center M.D.   On: 10/28/2023 05:12     Assessment and Plan:   Acute respiratory failure with hypoxia, respiratory distress, and acute  HFmrEF: Primary concern is the patient's marked respiratory distress and hypoxia.  I suspect this is due to acute heart failure superimposed on COPD.  She appears edematous on exam and also has chest radiograph findings suggestive of volume overload, including interstitial edema and small left pleural effusion.  BiPAP has been placed, which will hopefully improve her respiratory status.  I recommend administration of furosemide 40 mg IV x 1 and initiation of nitroglycerin infusion to improve blood pressure control and reduce preload.  Further management of concurrent COPD per ED team.  STEMI: The patient denies angina though her EKGs today demonstrate 1 to 2 mm of ST elevation in V3 and V4, new from tracings during her prior admission last month.  It is possible that these ST changes reflect abnormal repolarization in the setting of her sinus tachycardia, marked hypertension, and underlying LVH, given the lack of chest pain.  Demand ischemia and ACS in the absence of chest pain but contributing to her respiratory decompensation are certainly possible.  However, with the patient's tenuous respiratory status at this time, the risks of emergent cardiac catheterization outweigh benefits.  I recommend treatment of her acute respiratory distress as outlined above with repeat EKG in about 15 minutes to evaluate for changes in her ST segment abnormalities.  Serial troponins should also be trended.  Repeat echocardiogram also recommended to assess for further decline in LVEF or  worsening of mitral regurgitation.  Recommend initiation of heparin infusion and administration of aspirin 324 mg (if not already done).  Paroxysmal atrial fibrillation: Angela Mason is in sinus rhythm at this time.  She was discharged on amiodarone and apixaban, which she presumably has been taking at her nursing facility.  It is reasonable to continue amiodarone for now.  I recommend holding apixaban and initiating heparin infusion in the setting of possible ACS.   Risk Assessment/Risk Scores:     TIMI Risk Score for ST  Elevation MI:   The patient's TIMI risk score is 10, which indicates a 35.9% risk of all cause mortality at 30 days.   New York Heart Association (NYHA) Functional Class NYHA Class IV  CHA2DS2-VASc Score = 5   This indicates a 7.2% annual risk of stroke. The patient's score is based upon: CHF History: 1 HTN History: 1 Diabetes History: 0 Stroke History: 0 Vascular Disease History: 0 Age Score: 2 Gender Score: 1     As the patient has been followed in the past by [facility], they will assume ongoing cardiology care later today.  For questions or updates, please contact [facility] Please consult [email or web address] for contact info under [facility].  Signed, Yvonne Kendall, MD  10/28/2023 5:26 AM

## 2023-10-28 NOTE — ED Notes (Signed)
 Code Stemi called in the field

## 2023-10-28 NOTE — ED Notes (Signed)
 RT at bedside.

## 2023-10-28 NOTE — ED Notes (Signed)
 Stemi cancelled by End, MD at this time.

## 2023-10-28 NOTE — ED Notes (Signed)
Critical care NP at bedside.  

## 2023-10-28 NOTE — ED Notes (Signed)
 RT called on BT

## 2023-10-28 NOTE — ED Notes (Signed)
 Dr. Okey Dupre arrived to room

## 2023-10-28 NOTE — ED Notes (Signed)
 Pt now more alert than she was upon arrival to ER. Pt is able to respond to commands and answer questions appropriately. Pt's daughter is at bedside. Pt keeps c/o discomfort w/ her mask despite multiple readjustments.

## 2023-10-28 NOTE — ED Notes (Signed)
 Cardiologist at bedside.

## 2023-10-28 NOTE — ED Provider Notes (Signed)
 Signout from Dr. Elesa Massed.  See updated ED course below.  Physical Exam  BP 92/66   Pulse (!) 115   Temp 98.8 F (37.1 C) (Rectal)   Resp (!) 22   Ht 5' (1.524 m)   SpO2 99%   BMI 19.49 kg/m   Physical Exam  Procedures  Procedures  ED Course / MDM   Clinical Course as of 10/28/23 0834  Wed Oct 28, 2023  0717 S/o from Dr. Elesa Massed: 89 year old female presenting from nursing facility in respiratory distress, satting low 80s on her 1 L baseline oxygen.  Minimally improved with CPAP, minimally interactive on arrival though significantly improved on BiPAP.  Troponin somewhat elevated, discussed with cardiology, EKG reviewed, felt to be NSTEMI (, started on heparin, given aspirin.  ICU evaluating to decide disposition, ICU versus stepdown.  Follow-up ICU recommendations.  Patient is full code.  TO DO: - f/u ICU recs [MM]  [phone] D/w ICU team (Dr. Aundria Rud) -Given the patient is doing very well on BiPAP and mentating appropriately, believe she is stable for progressive unit.  Will discuss with hospitalist.  Hospitalist consult order placed. [MM]  [id number] Accepted by Dr. Chipper Herb for admission to hospitalist service [MM]    Clinical Course User Index [MM] Marinell Blight, MD   Medical Decision Making Amount and/or Complexity of Data Reviewed Labs: ordered. Radiology: ordered.  Risk OTC drugs. Prescription drug management. Decision regarding hospitalization.          Marinell Blight, MD 10/28/23 [phone]

## 2023-10-28 NOTE — Consult Note (Signed)
 [facility] CARDIOLOGY CONSULT NOTE       [name] MRN: [id number] DOB/AGE: [birth year] 89 y.o.  Admit date: 10/28/2023 Referring Physician Dr. Herma Mering Primary Physician [name], MD  Primary Cardiologist None Reason for Consultation AF RVR, AoCHF  HPI: Angela Mason is a 89 y.o. female  with a past medical history of paroxysmal atrial fibrillation diagnosed last month, hypertension, hyperlipidemia, COPD, hypothyroidism who presented to the ED on 10/28/2023 for shortness of breath. Cardiology was consulted for further evaluation.   Patient was seen during hospitalization by our service last week at which time she was treated for atrial fibrillation RVR and mild volume overload.  Late yesterday evening at her [facility] she developed significant shortness of breath and increased work of breathing and thus EMS was called.  Initial EKG with EMS was concerning for ST elevation and thus code STEMI was called, this was ultimately canceled by Dr. Okey Dupre upon evaluation in the ED as ST elevation improved and patient was not having any chest pain.  Further workup in the ED notable for creatinine 1.10, potassium 4.3, hemoglobin 13.0, WBC 13.3.  BNP elevated at 1200.  Troponins trended 81 > 148.  Initial blood gas with pH of 7.18, pCO2 97.  She was started on BiPAP in the ED given a dose of IV Lasix.  At the time my evaluation this morning patient is sitting upright in ED stretcher on BiPAP with daughter present at bedside.  In atrial fibrillation on telemetry with rates in the 130s.  We discussed her symptoms in further detail.  Main issue has been the shortness of breath.  Patient reports that BiPAP mask is uncomfortable.  She denies any chest pain.  Also denies any palpitation symptoms.  Review of systems complete and found to be negative unless listed above    Past Medical History:  Diagnosis Date   Cancer (HCC)    SKIN   COPD, mild (HCC)    Dyspnea    Edema    Heart  failure with mildly reduced ejection fraction (HFmrEF) (HCC)    HLD (hyperlipidemia)    HTN (hypertension)    Hypothyroidism    Macular degeneration, bilateral    Paroxysmal atrial fibrillation (HCC)    Varicosities     Past Surgical History:  Procedure Laterality Date   CATARACT EXTRACTION W/PHACO Right 02/03/2017   Procedure: CATARACT EXTRACTION PHACO AND INTRAOCULAR LENS PLACEMENT (IOC);  Surgeon: Galen Manila, MD;  Location: [facility] ORS;  Service: Ophthalmology;  Laterality: Right;  [place] 00:57.2 AP% 18.5 CDE 10.58 Fluid pack lot # [id number]   CATARACT EXTRACTION W/PHACO Left 02/24/2017   Procedure: CATARACT EXTRACTION PHACO AND INTRAOCULAR LENS PLACEMENT (IOC);  Surgeon: Galen Manila, MD;  Location: [facility] ORS;  Service: Ophthalmology;  Laterality: Left;  [place] 01:12 AP% 25.0 CDE 18.03 Fluid pack lot # [id number]   MOHS SURGERY  2015   nose   SALIVARY GLAND SURGERY Left 2012   TONSILLECTOMY  1940    (Not in a hospital admission)  Social History   Socioeconomic History   Marital status: Widowed    Spouse name: Not on file   Number of children: Not on file   Years of education: Not on file   Highest education level: Not on file  Occupational History   Not on file  Tobacco Use   Smoking status: Former    Current packs/day: 0.00    Types: Cigarettes    Quit date: 09/05/2005    Years since quitting: 18.1  Smokeless tobacco: Never  Vaping Use   Vaping status: Never Used  Substance and Sexual Activity   Alcohol use: Not Currently   Drug use: No   Sexual activity: Not Currently    Birth control/protection: Post-menopausal  Other Topics Concern   Not on file  Social History Narrative   Not on file   Social Drivers of Health   Financial Resource Strain: Not on file  Food Insecurity: No Food Insecurity (10/18/2023)   Hunger Vital Sign    Worried About Running Out of Food in the Last Year: Never true    Ran Out of Food in the Last Year: Never true  Transportation  Needs: No Transportation Needs (10/18/2023)   PRAPARE - Administrator, Civil Service (Medical): No    Lack of Transportation (Non-Medical): No  Physical Activity: Not on file  Stress: Not on file  Social Connections: Socially Isolated (10/18/2023)   Social Connection and Isolation Panel [NHANES]    Frequency of Communication with Friends and Family: More than three times a week    Frequency of Social Gatherings with Friends and Family: Twice a week    Attends Religious Services: Never    Database administrator or Organizations: No    Attends Banker Meetings: Never    Marital Status: Widowed  Intimate Partner Violence: Not At Risk (10/18/2023)   Humiliation, Afraid, Rape, and Kick questionnaire    Fear of Current or Ex-Partner: No    Emotionally Abused: No    Physically Abused: No    Sexually Abused: No    Family History  Problem Relation Age of Onset   Diverticulitis Mother    Macular degeneration Mother    COPD Father    Heart failure Father    Epilepsy Sister    Parkinson's disease Brother    Cancer Neg Hx    Diabetes Neg Hx    Breast cancer Neg Hx      Vitals:   10/28/23 0910 10/28/23 0920 10/28/23 1040 10/28/23 1042  BP: 92/64 (!) 83/66 107/72   Pulse: (!) 121 (!) 105 (!) 110 (!) 118  Resp: 18 19 (!) 31 (!) 29  Temp:      TempSrc:      SpO2: 100% 99% 99% 96%  Height:        PHYSICAL EXAM General: Ill-appearing elderly female, well nourished, in no acute distress. HEENT: Normocephalic and atraumatic. Neck: No JVD.  Lungs: On BiPAP. Clear bilaterally to auscultation.  Heart: Irregularly irregular, elevated rate. Normal S1 and S2 without gallops or murmurs.  Abdomen: Non-distended appearing.  Msk: Normal strength and tone for age. Extremities: Warm and well perfused. No clubbing, cyanosis.  No edema.  Neuro: Alert and oriented X 3. Psych: Answers questions appropriately.   Labs: Basic Metabolic Panel: Recent Labs    10/28/23 0500   NA 140  K 4.3  CL 98  CO2 32  GLUCOSE 217*  BUN 26*  CREATININE 1.10*  CALCIUM 8.9   Liver Function Tests: No results for input(s): "AST", "ALT", "ALKPHOS", "BILITOT", "PROT", "ALBUMIN" in the last 72 hours. No results for input(s): "LIPASE", "AMYLASE" in the last 72 hours. CBC: Recent Labs    10/28/23 0500  WBC 13.3*  NEUTROABS 10.0*  HGB 13.0  HCT 42.6  MCV 105.7*  PLT 298   Cardiac Enzymes: Recent Labs    10/28/23 0500 10/28/23 0743  TROPONINIHS 81* 148*   BNP: Recent Labs    10/28/23 0500  BNP  1,228.0*   D-Dimer: No results for input(s): "DDIMER" in the last 72 hours. Hemoglobin A1C: No results for input(s): "HGBA1C" in the last 72 hours. Fasting Lipid Panel: No results for input(s): "CHOL", "HDL", "LDLCALC", "TRIG", "CHOLHDL", "LDLDIRECT" in the last 72 hours. Thyroid Function Tests: No results for input(s): "TSH", "T4TOTAL", "T3FREE", "THYROIDAB" in the last 72 hours.  Invalid input(s): "FREET3" Anemia Panel: No results for input(s): "VITAMINB12", "FOLATE", "FERRITIN", "TIBC", "IRON", "RETICCTPCT" in the last 72 hours.   Radiology: DG Chest Portable 1 View Result Date: 10/28/2023 CLINICAL DATA:  Shortness of breath. EXAM: PORTABLE CHEST 1 VIEW COMPARISON:  10/17/2023 FINDINGS: Interstitial markings are diffusely coarsened with chronic features. Interval development of interstitial and alveolar opacity in the right upper lung. Small bilateral pleural effusions suspected. The cardiopericardial silhouette is within normal limits for size. Bones are diffusely demineralized. Telemetry leads overlie the chest. IMPRESSION: 1. Interval development of interstitial and alveolar opacity in the right upper lung, suspicious for pneumonia although asymmetric edema is also a consideration. 2. Small bilateral pleural effusions. Electronically Signed   By: Kennith Center M.D.   On: 10/28/2023 05:12   ECHOCARDIOGRAM COMPLETE Result Date: 10/18/2023    ECHOCARDIOGRAM REPORT    Patient Name:   AIRIKA ALKHATIB Date of Exam: 10/18/2023 Medical Rec #:  [id number]       Height:       60.0 in Accession #:    [id number]      Weight:       95.0 lb Date of Birth:  [birth year]       BSA:          1.360 m Patient Age:    89 years        BP:           118/83 mmHg Patient Gender: F               HR:           116 bpm. Exam Location:  [facility] Procedure: 2D Echo, Cardiac Doppler and Color Doppler (Both Spectral and Color            Flow Doppler were utilized during procedure). Indications:     CHF- Acute Diastolic I50.31  History:         Patient has no prior history of Echocardiogram examinations.  Sonographer:     Elwin Sleight RDCS Referring Phys:  Wynona Neat NIU Diagnosing Phys: Adrian Blackwater  Sonographer Comments: Image acquisition challenging due to respiratory motion. IMPRESSIONS  1. Left ventricular ejection fraction, by estimation, is 45 to 50%. The left ventricle has mildly decreased function. The left ventricle demonstrates global hypokinesis. The left ventricular internal cavity size was moderately dilated. There is mild concentric left ventricular hypertrophy. Left ventricular diastolic parameters are indeterminate.  2. Right ventricular systolic function is moderately reduced. The right ventricular size is moderately enlarged. Mildly increased right ventricular wall thickness.  3. Left atrial size was severely dilated.  4. Right atrial size was severely dilated.  5. The mitral valve is grossly normal. Moderate mitral valve regurgitation.  6. Tricuspid valve regurgitation is mild to moderate.  7. The aortic valve is calcified. Aortic valve regurgitation is not visualized. Aortic valve sclerosis/calcification is present, without any evidence of aortic stenosis. FINDINGS  Left Ventricle: Left ventricular ejection fraction, by estimation, is 45 to 50%. The left ventricle has mildly decreased function. The left ventricle demonstrates global hypokinesis. Strain was performed and the global longitudinal  strain is indeterminate. The left ventricular internal  cavity size was moderately dilated. There is mild concentric left ventricular hypertrophy. Left ventricular diastolic parameters are indeterminate. Right Ventricle: The right ventricular size is moderately enlarged. Mildly increased right ventricular wall thickness. Right ventricular systolic function is moderately reduced. Left Atrium: Left atrial size was severely dilated. Right Atrium: Right atrial size was severely dilated. Pericardium: There is no evidence of pericardial effusion. Mitral Valve: The mitral valve is grossly normal. Moderate mitral valve regurgitation. Tricuspid Valve: The tricuspid valve is grossly normal. Tricuspid valve regurgitation is mild to moderate. Aortic Valve: The aortic valve is calcified. Aortic valve regurgitation is not visualized. Aortic valve sclerosis/calcification is present, without any evidence of aortic stenosis. Aortic valve peak gradient measures 10.6 mmHg. Pulmonic Valve: The pulmonic valve was grossly normal. Pulmonic valve regurgitation is mild. Aorta: The aortic root, ascending aorta and aortic arch are all structurally normal, with no evidence of dilitation or obstruction. IAS/Shunts: No atrial level shunt detected by color flow Doppler. Additional Comments: 3D was performed not requiring image post processing on an independent workstation and was indeterminate.  LEFT VENTRICLE PLAX 2D LVIDd:         3.20 cm   Diastology LVIDs:         2.40 cm   LV e' lateral: 12.40 cm/s LV PW:         1.10 cm LV IVS:        1.00 cm LVOT diam:     1.90 cm LV SV:         29 LV SV Index:   22 LVOT Area:     2.84 cm  RIGHT VENTRICLE RV Basal diam:  2.80 cm LEFT ATRIUM             Index        RIGHT ATRIUM           Index LA diam:        3.50 cm 2.57 cm/m   RA Area:     17.00 cm LA Vol (A2C):   32.7 ml 24.04 ml/m  RA Volume:   48.00 ml  35.29 ml/m LA Vol (A4C):   39.2 ml 28.82 ml/m LA Biplane Vol: 39.8 ml 29.26 ml/m  AORTIC  VALVE                 PULMONIC VALVE AV Area (Vmax): 1.29 cm     PV Vmax:        0.64 m/s AV Vmax:        162.50 cm/s  PV Peak grad:   1.6 mmHg AV Peak Grad:   10.6 mmHg    RVOT Peak grad: 1 mmHg LVOT Vmax:      73.90 cm/s LVOT Vmean:     50.100 cm/s LVOT VTI:       0.104 m  AORTA Ao Root diam: 2.80 cm TRICUSPID VALVE TR Peak grad:   33.6 mmHg TR Vmax:        290.00 cm/s  SHUNTS Systemic VTI:  0.10 m Systemic Diam: 1.90 cm Adrian Blackwater Electronically signed by Adrian Blackwater Signature Date/Time: 10/18/2023/12:36:21 PM    Final    CT Angio Chest PE W and/or Wo Contrast Result Date: 10/17/2023 CLINICAL DATA:  Shortness of breath.  History of COPD.  Ex-smoker. EXAM: CT ANGIOGRAPHY CHEST WITH CONTRAST TECHNIQUE: Multidetector CT imaging of the chest was performed using the standard protocol during bolus administration of intravenous contrast. Multiplanar CT image reconstructions and MIPs were obtained to evaluate the vascular anatomy. RADIATION DOSE REDUCTION:  This exam was performed according to the departmental dose-optimization program which includes automated exposure control, adjustment of the mA and/or kV according to patient size and/or use of iterative reconstruction technique. CONTRAST:  60mL OMNIPAQUE IOHEXOL 350 MG/ML SOLN COMPARISON:  Plain film of earlier today.  No prior CT. FINDINGS: Cardiovascular: The quality of this exam for evaluation of pulmonary embolism is good. Although the bolus is well timed, limitations include minimal motion, EKG wire artifact and patient arm position, not raised above the head. No evidence of pulmonary embolism. Advanced aortic and branch vessel atherosclerosis. The aorta is not well opacified secondary to bolus timing. Moderate cardiomegaly, without pericardial effusion. Left main and 3 vessel coronary artery calcification. Mediastinum/Nodes: No mediastinal or hilar adenopathy. Lungs/Pleura: Small bilateral pleural effusions. Moderate centrilobular emphysema. Right middle  lobe endobronchial compression/narrowing at the level of the proximal segmental bronchi including on 68/4. This results in right middle lobe collapse. No well-defined obstructive mass. Biapical pleuroparenchymal scarring. Left greater than right base dependent compressive atelectasis. Upper Abdomen: Reflux of contrast into the hepatic veins suggests elevated right heart pressures. Artifact degradation continuing into the upper abdomen. No gross acute finding. Musculoskeletal: Lower thoracic spondylosis with S-shaped thoracolumbar spine curvature. Review of the MIP images confirms the above findings. IMPRESSION: 1.  No evidence of pulmonary embolism. 2. Mild multifocal degradation as detailed above. 3. Small bilateral pleural effusions. Findings of elevated right heart pressures. Suspect a component of fluid overload. 4. Right middle lobe endobronchial obstruction or compression at the level of the proximal subsegmental bronchi resulting in right middle lobe collapse. No well-defined obstructive mass. Consider further evaluation with bronchoscopy with attention to the right middle lobe bronchus. Alternatively, especially given patient age and comorbidities, CT follow-up at 6 months could be performed. 5. Aortic atherosclerosis (ICD10-I70.0), coronary artery atherosclerosis and emphysema (ICD10-J43.9). Electronically Signed   By: Jeronimo Greaves M.D.   On: 10/17/2023 18:40   DG Chest 2 View Result Date: 10/17/2023 CLINICAL DATA:  Shortness of breath EXAM: CHEST - 2 VIEW COMPARISON:  Chest radiograph dated 06/26/2022 FINDINGS: Normal lung volumes. No focal consolidations. Trace blunting of bilateral costophrenic angles. No pneumothorax. Similar mildly enlarged cardiomediastinal silhouette. No acute osseous abnormality. Unchanged compression deformity of T4. IMPRESSION: 1. Small bilateral pleural effusions. 2. Similar mild cardiomegaly. Electronically Signed   By: Agustin Cree M.D.   On: 10/17/2023 15:23    ECHO  pending  TELEMETRY reviewed by me 10/28/2023: Atrial fibrillation RVR rate 130s  EKG reviewed by me: Initial EKG in the ED sinus tach, repeat this a.m. atrial fibrillation rate 121 bpm  Data reviewed by me 10/28/2023: last 24h vitals tele labs imaging I/O ED provider note, admission H&P  Principal Problem:   Afib (HCC) Active Problems:   A-fib (HCC)   COPD with acute exacerbation (HCC)    ASSESSMENT AND PLAN:  Marita Burnsed is a 89 y.o. female  with a past medical history of paroxysmal atrial fibrillation diagnosed last month, hypertension, hyperlipidemia, COPD, hypothyroidism who presented to the ED on 10/28/2023 for shortness of breath. Cardiology was consulted for further evaluation.   # Atrial fibrillation RVR # Recent onset atrial fibrillation # Acute on chronic HFmrEF # Acute hypoxic respiratory failure Patient with recent hospitalization for A-fib RVR and acute heart failure presenting back with shortness of breath and increased work of breathing.  Suspect heart failure exacerbation with BNP elevated at 1200.  Also in A-fib RVR on telemetry in the ED. -Start IV amiodarone load and infusion. -Continue heparin.  Patient was taking Eliquis prior to admission. -Continue IV Lasix 40 mg twice daily.  Monitor renal function closely with diuresis.  Creatinine bumped after few doses of IV Lasix last admission. -Continue Farxiga 10 mg daily.  Plan to resume metoprolol as able. -Continue pravastatin 20 mg daily. -Mildly elevated troponins most consistent with demand/supply mismatch and not ACS in the setting of acute heart failure, acute respiratory failure, A-fib RVR.  This patient's plan of care was discussed and created with Dr. Darrold Junker and he is in agreement.  Signed: Gale Journey, PA-C  10/28/2023, 10:46 AM [facility]

## 2023-10-28 NOTE — Progress Notes (Signed)
 ANTICOAGULATION CONSULT NOTE  Pharmacy Consult for heparin infusion Indication: ACS / STEMI  Allergies  Allergen Reactions   Gentamicin    Levofloxacin Diarrhea    Patient Measurements: Height: 5' (152.4 cm) IBW/kg (Calculated) : 45.5 Heparin Dosing Weight: 45.3 kg  Vital Signs: Temp: 98.8 F (37.1 C) (04/02 0519) Temp Source: Rectal (04/02 0519) BP: 95/68 (04/02 1220) Pulse Rate: 100 (04/02 1220)  Labs: Recent Labs    10/28/23 0500 10/28/23 0743 10/28/23 1418  HGB 13.0  --   --   HCT 42.6  --   --   PLT 298  --   --   APTT 27  --  93*  LABPROT 15.6*  --   --   INR 1.2  --   --   HEPARINUNFRC >1.10*  --   --   CREATININE 1.10*  --   --   TROPONINIHS 81* 148*  --     Estimated Creatinine Clearance: 24.8 mL/min (A) (by C-G formula based on SCr of 1.1 mg/dL (H)).   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   Pt recently started on Eliquis on 10/19/23 for A fib during prior admission.  Goal of Therapy:  Heparin level 0.3-0.7 units/ml aPTT 66-102 seconds Monitor platelets by anticoagulation protocol: Yes  Date: Time: aPTT / HL: Comment/Rate: 4/2 1418 93 / --  aPTT therapeutic x 1@600  units/hr  Plan:  Continue heparin infusion at 600 units/hr Will follow aPTT until correlation w/ HL confirmed Will check confirmatory aPTT in 8 hr HL & CBC daily while on heparin  Bettey Costa, PharmD Clinical Pharmacist 10/28/2023 2:44 PM

## 2023-10-29 ENCOUNTER — Ambulatory Visit: Admitting: Obstetrics and Gynecology

## 2023-10-29 DIAGNOSIS — J9601 Acute respiratory failure with hypoxia: Secondary | ICD-10-CM

## 2023-10-29 DIAGNOSIS — J441 Chronic obstructive pulmonary disease with (acute) exacerbation: Secondary | ICD-10-CM

## 2023-10-29 DIAGNOSIS — Z515 Encounter for palliative care: Secondary | ICD-10-CM

## 2023-10-29 DIAGNOSIS — I480 Paroxysmal atrial fibrillation: Secondary | ICD-10-CM | POA: Diagnosis not present

## 2023-10-29 DIAGNOSIS — I509 Heart failure, unspecified: Secondary | ICD-10-CM | POA: Diagnosis not present

## 2023-10-29 DIAGNOSIS — J9602 Acute respiratory failure with hypercapnia: Secondary | ICD-10-CM

## 2023-10-29 LAB — BLOOD GAS, VENOUS
Bicarbonate: 40.5 mmol/L — ABNORMAL HIGH (ref 20.0–28.0)
Delivery systems: POSITIVE
Patient temperature: 37 mmol/L — ABNORMAL HIGH (ref 0.0–2.0)
pCO2, Ven: 99 mmHg (ref 44–60)
pH, Ven: 7.22 — ABNORMAL LOW (ref 7.25–7.43)
pO2, Ven: 40.5 mmol/L — ABNORMAL HIGH (ref 32–45)

## 2023-10-29 LAB — CBC
HCT: 29.7 % — ABNORMAL LOW (ref 36.0–46.0)
Hemoglobin: 9.5 g/dL — ABNORMAL LOW (ref 12.0–15.0)
MCH: 32.4 pg (ref 26.0–34.0)
MCHC: 32 g/dL (ref 30.0–36.0)
MCV: 101.4 fL — ABNORMAL HIGH (ref 80.0–100.0)
Platelets: 187 10*3/uL (ref 150–400)
RBC: 2.93 MIL/uL — ABNORMAL LOW (ref 3.87–5.11)
RDW: 13.9 % (ref 11.5–15.5)
WBC: 9.9 10*3/uL (ref 4.0–10.5)
nRBC: 0 % (ref 0.0–0.2)

## 2023-10-29 LAB — BASIC METABOLIC PANEL WITH GFR
Anion gap: 7 (ref 5–15)
BUN: 34 mg/dL — ABNORMAL HIGH (ref 8–23)
CO2: 35 mmol/L — ABNORMAL HIGH (ref 22–32)
Calcium: 8.1 mg/dL — ABNORMAL LOW (ref 8.9–10.3)
Chloride: 97 mmol/L — ABNORMAL LOW (ref 98–111)
Creatinine, Ser: 1.02 mg/dL — ABNORMAL HIGH (ref 0.44–1.00)
GFR, Estimated: 53 mL/min — ABNORMAL LOW (ref >60)
Glucose, Bld: 151 mg/dL — ABNORMAL HIGH (ref 70–99)
Potassium: 3.7 mmol/L (ref 3.5–5.1)
Sodium: 139 mmol/L (ref 135–145)

## 2023-10-29 MED ORDER — APIXABAN 2.5 MG PO TABS
2.5000 mg | ORAL_TABLET | Freq: Two times a day (BID) | ORAL | Status: DC
  Administered 2023-10-29 – 2023-10-31 (×5): 2.5 mg via ORAL
  Filled 2023-10-29 (×5): qty 1

## 2023-10-29 MED ORDER — AMIODARONE HCL 200 MG PO TABS
400.0000 mg | ORAL_TABLET | Freq: Two times a day (BID) | ORAL | Status: DC
Start: 2023-10-29 — End: 2023-10-31
  Administered 2023-10-29 – 2023-10-31 (×5): 400 mg via ORAL
  Filled 2023-10-29 (×5): qty 2

## 2023-10-29 MED ORDER — AMIODARONE HCL 200 MG PO TABS: 200.0000 mg | ORAL_TABLET | Freq: Every day | ORAL | Status: DC

## 2023-10-29 NOTE — ED Notes (Signed)
 Dr Georgeann Oppenheim at bedside

## 2023-10-29 NOTE — Progress Notes (Signed)
 ANTICOAGULATION CONSULT NOTE  Pharmacy Consult for heparin infusion Indication: ACS / STEMI  Allergies  Allergen Reactions   Gentamicin    Levofloxacin Diarrhea    Patient Measurements: Height: 5' (152.4 cm) IBW/kg (Calculated) : 45.5 Heparin Dosing Weight: 45.3 kg  Vital Signs: Temp: 98.6 F (37 C) (04/02 2225) Temp Source: Oral (04/02 2225) BP: 96/54 (04/02 2330) Pulse Rate: 67 (04/02 2330)  Labs: Recent Labs    10/28/23 0500 10/28/23 0743 10/28/23 1418 10/28/23 2327  HGB 13.0  --   --   --   HCT 42.6  --   --   --   PLT 298  --   --   --   APTT 27  --  93* 155*  LABPROT 15.6*  --   --   --   INR 1.2  --   --   --   HEPARINUNFRC >1.10*  --   --   --   CREATININE 1.10*  --   --   --   TROPONINIHS 81* 148* 150*  --     Estimated Creatinine Clearance: 24.8 mL/min (A) (by C-G formula based on SCr of 1.1 mg/dL (H)).   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   Pt recently started on Eliquis on 10/19/23 for A fib during prior admission.  Goal of Therapy:  Heparin level 0.3-0.7 units/ml aPTT 66-102 seconds Monitor platelets by anticoagulation protocol: Yes  Date: Time: aPTT / HL: Comment/Rate: 4/2 1418 93 / --  aPTT therapeutic x 1@600  units/hr 4/2 2327 155 / --  aPTT supratherapeutic  Plan: Contacted RN, lab drawn off opposite arm D/T upcoming downtime, will not hold infusion Decrease heparin infusion to 450 units/hr Will follow aPTT until correlation w/ HL  confirmed Will check aPTT in 8 hr after rate change. HL & CBC daily while on heparin  Otelia Sergeant, PharmD, [facility] 10/29/2023 12:22 AM

## 2023-10-29 NOTE — Progress Notes (Signed)
 PROGRESS NOTE    Angela Mason  ION:[id number] DOB: 18-Jul-1933 DOA: 10/28/2023 PCP: [name], MD    Brief Narrative:   89 y.o. female with medical history significant of HTN, HLD, PAF on Eliquis, COPD, hypothyroidism, sent from nursing home for duration of worsening of shortness of breath.   Symptoms started yesterday, patient started to have dry cough wheezing and shortness of breath, denied any chest pain no fever or chills.  At baseline patient is fragile, she complains about occasional shortness of breath associated with walking, but denied any chest pains.  She was never diagnosed with COPD, no formal lung function test in the past.   ED Course: Tachycardia and telemonitoring showed A-fib, blood pressure was high and patient was started on nitroglycerin drip.  EKG showed ST elevation on lead V3 and 4, troponin 81, code STEMI was called.  Repeat EKG showed resolution of ST changes. Blood gas 7.2 2/99/48, patient was started on BiPAP.  Blood work showed hemoglobin 13, WBC 13, creatinine 1.1, K4.3.   Cardiology evaluated the patient and consulted STEMI code patient was started on heparin drip.  Patient was started on amiodarone drip, briefly patient heart rate come back to normal sinus however soon went back to rapid A-fib again.  4/3: Patient weaned to nasal cannula.  Seems to be saturating well.  Heart rate improved on amiodarone gtt.  Assessment & Plan:   Principal Problem:   Afib (HCC) Active Problems:   A-fib (HCC)   COPD with acute exacerbation (HCC)   Acute hypoxic and hypercapnic respiratory failure Acute decompensated respiratory acidosis COPD exacerbation Initially required NIPPV.  Did not require intubation.  Respiratory status is improving.  Weaned to nasal cannula. Plan: Oxygen, wean as tolerated Can start p.o. prednisone from today Nebulizer regimen Incentive spirometry and flutter valve Goal oxygen saturation 88-92%   A-fib with RVR Heart rate  improved.  Amiodarone gtt. being weaned Plan: Amiodarone p.o. load start today. DC heparin GTT Start Eliquis 2.5 mg twice daily per age restrictions Continue cardiac monitoring  Elevated troponins -Troponin uptrending 81 > 148, no chest pains, code STEMI canceled.  ST-T changes on V3 and V4 was considered to related to tachycardia.  No significant delta and troponins.  Suspect supply/demand ischemia in the setting of decompensated COPD and rapid atrial fibrillation Plan: 2D echocardiogram No plans for ischemic evaluation  Acute on chronic HFpEF decompensated -Secondary to A-fib with RVR, management as above -Lasix 40 mg IV daily today.  Consider p.o. dosing tomorrow   Moderate protein calorie malnutrition -BMI 18 -Continue protein supplement   DVT prophylaxis: Eliquis Code Status: Full Family Communication: None today Disposition Plan: Status is: Inpatient Remains inpatient appropriate because: Multiple acute issues as above   Level of care: Progressive  Consultants:  Cardiology-[facility]  Procedures:  None  Antimicrobials: None   Subjective: Seen and examined.  Sitting in bed.  Appears relatively comfortable.  States that she is not sure that she is improving.  Objective: Vitals:   10/29/23 0850 10/29/23 0900 10/29/23 0930 10/29/23 1000  BP:  118/65 (!) 116/59 122/63  Pulse:  93 71 68  Resp:  (!) 24 (!) 29 17  Temp: 97.9 F (36.6 C)     TempSrc: Oral     SpO2:  (!) 88% 99% 100%  Height:        Intake/Output Summary (Last 24 hours) at 10/29/2023 1307 Last data filed at 10/29/2023 1038 Gross per 24 hour  Intake 329.69 ml  Output 1000 ml  Net -670.31 ml   There were no vitals filed for this visit.  Examination:  General exam: NAD.  Appears stated age Respiratory system: Bibasilar crackles.  Normal work of breathing.  2 L Cardiovascular system: S1-2, RRR, no murmurs, pedal edema Gastrointestinal system: Soft, NT/ND, normal bowel sounds Central  nervous system: Alert.  Oriented x 2.  No focal deficits Extremities: Symmetric 5 x 5 power. Skin: No rashes, lesions or ulcers Psychiatry: Judgement and insight appear normal. Mood & affect appropriate.     Data Reviewed: I have personally reviewed following labs and imaging studies  CBC: Recent Labs  Lab 10/28/23 0500 10/29/23 0321  WBC 13.3* 9.9  NEUTROABS 10.0*  --   HGB 13.0 9.5*  HCT 42.6 29.7*  MCV 105.7* 101.4*  PLT 298 187   Basic Metabolic Panel: Recent Labs  Lab 10/28/23 0500 10/29/23 0321  NA 140 139  K 4.3 3.7  CL 98 97*  CO2 32 35*  GLUCOSE 217* 151*  BUN 26* 34*  CREATININE 1.10* 1.02*  CALCIUM 8.9 8.1*   GFR: Estimated Creatinine Clearance: 26.7 mL/min (A) (by C-G formula based on SCr of 1.02 mg/dL (H)). Liver Function Tests: No results for input(s): "AST", "ALT", "ALKPHOS", "BILITOT", "PROT", "ALBUMIN" in the last 168 hours. No results for input(s): "LIPASE", "AMYLASE" in the last 168 hours. No results for input(s): "AMMONIA" in the last 168 hours. Coagulation Profile: Recent Labs  Lab 10/28/23 0500  INR 1.2   Cardiac Enzymes: No results for input(s): "CKTOTAL", "CKMB", "CKMBINDEX", "TROPONINI" in the last 168 hours. BNP (last 3 results) No results for input(s): "PROBNP" in the last 8760 hours. HbA1C: No results for input(s): "HGBA1C" in the last 72 hours. CBG: No results for input(s): "GLUCAP" in the last 168 hours. Lipid Profile: No results for input(s): "CHOL", "HDL", "LDLCALC", "TRIG", "CHOLHDL", "LDLDIRECT" in the last 72 hours. Thyroid Function Tests: No results for input(s): "TSH", "T4TOTAL", "FREET4", "T3FREE", "THYROIDAB" in the last 72 hours. Anemia Panel: No results for input(s): "VITAMINB12", "FOLATE", "FERRITIN", "TIBC", "IRON", "RETICCTPCT" in the last 72 hours. Sepsis Labs: Recent Labs  Lab 10/28/23 0500  PROCALCITON <0.10    Recent Results (from the past 240 hours)  Resp panel by RT-PCR (RSV, Flu A&B, Covid)  Anterior Nasal Swab     Status: None   Collection Time: 10/28/23  4:57 AM   Specimen: Anterior Nasal Swab  Result Value Ref Range Status   SARS Coronavirus 2 by RT PCR NEGATIVE NEGATIVE Final    Comment: (NOTE) SARS-CoV-2 target nucleic acids are NOT DETECTED.  The SARS-CoV-2 RNA is generally detectable in upper respiratory specimens during the acute phase of infection. The lowest concentration of SARS-CoV-2 viral copies this assay can detect is 138 copies/mL. A negative result does not preclude SARS-Cov-2 infection and should not be used as the sole basis for treatment or other patient management decisions. A negative result may occur with  improper specimen collection/handling, submission of specimen other than nasopharyngeal swab, presence of viral mutation(s) within the areas targeted by this assay, and inadequate number of viral copies(<138 copies/mL). A negative result must be combined with clinical observations, patient history, and epidemiological information. The expected result is Negative.  Fact Sheet for Patients:  BloggerCourse.com  Fact Sheet for Healthcare Providers:  SeriousBroker.it  This test is no t yet approved or cleared by the [place] FDA and  has been authorized for detection and/or diagnosis of SARS-CoV-2 by FDA under an Emergency Use Authorization (EUA). This EUA will remain  in effect (meaning this test can be used) for the duration of the COVID-19 declaration under Section 564(b)(1) of the Act, 21 U.S.C.section 360bbb-3(b)(1), unless the authorization is terminated  or revoked sooner.       Influenza A by PCR NEGATIVE NEGATIVE Final   Influenza B by PCR NEGATIVE NEGATIVE Final    Comment: (NOTE) The Xpert Xpress SARS-CoV-2/FLU/RSV plus assay is intended as an aid in the diagnosis of influenza from Nasopharyngeal swab specimens and should not be used as a sole basis for treatment. Nasal washings  and aspirates are unacceptable for Xpert Xpress SARS-CoV-2/FLU/RSV testing.  Fact Sheet for Patients: BloggerCourse.com  Fact Sheet for Healthcare Providers: SeriousBroker.it  This test is not yet approved or cleared by the [place] FDA and has been authorized for detection and/or diagnosis of SARS-CoV-2 by FDA under an Emergency Use Authorization (EUA). This EUA will remain in effect (meaning this test can be used) for the duration of the COVID-19 declaration under Section 564(b)(1) of the Act, 21 U.S.C. section 360bbb-3(b)(1), unless the authorization is terminated or revoked.     Resp Syncytial Virus by PCR NEGATIVE NEGATIVE Final    Comment: (NOTE) Fact Sheet for Patients: BloggerCourse.com  Fact Sheet for Healthcare Providers: SeriousBroker.it  This test is not yet approved or cleared by the [place] FDA and has been authorized for detection and/or diagnosis of SARS-CoV-2 by FDA under an Emergency Use Authorization (EUA). This EUA will remain in effect (meaning this test can be used) for the duration of the COVID-19 declaration under Section 564(b)(1) of the Act, 21 U.S.C. section 360bbb-3(b)(1), unless the authorization is terminated or revoked.  Performed at [facility], [address] [place], [place] [address]   Blood culture (single)     Status: None (Preliminary result)   Collection Time: 10/28/23  5:00 AM   Specimen: BLOOD  Result Value Ref Range Status   Specimen Description BLOOD LEFT [facility]  Final   Special Requests   Final    BOTTLES DRAWN AEROBIC AND ANAEROBIC Blood Culture adequate volume   Culture   Final    NO GROWTH 1 DAY Performed at [facility], [address] [place], [place] [address]    Report Status PENDING  Incomplete         Radiology Studies: ECHOCARDIOGRAM COMPLETE Result Date: 10/28/2023     ECHOCARDIOGRAM REPORT   Patient Name:   Angela Mason Date of Exam: 10/28/2023 Medical Rec #:  [id number]       Height:       60.0 in Accession #:    [id number]      Weight:       99.8 lb Date of Birth:  06-Mar-1933       BSA:          1.389 m Patient Age:    89 years        BP:           96/64 mmHg Patient Gender: F               HR:           82 bpm. Exam Location:  [facility] Procedure: 2D Echo, Cardiac Doppler and Color Doppler (Both Spectral and Color            Flow Doppler were utilized during procedure). Indications:     Abnormal ECG R94.31  History:         Patient has prior history of Echocardiogram examinations, most  recent 10/18/2023. COPD; Risk Factors:Hypertension. Paroxysmal                  Afib.  Sonographer:     Cristela Blue Referring Phys:  [id number] Diagnosing Phys: Marcina Millard MD IMPRESSIONS  1. Left ventricular ejection fraction, by estimation, is 35 to 40%. The left ventricle has moderately decreased function. The left ventricle demonstrates regional wall motion abnormalities (see scoring diagram/findings for description). Left ventricular  diastolic parameters are indeterminate.  2. Right ventricular systolic function is normal. The right ventricular size is normal.  3. Left atrial size was mildly dilated.  4. Right atrial size was moderately dilated.  5. The mitral valve is normal in structure. No evidence of mitral valve regurgitation. No evidence of mitral stenosis.  6. The aortic valve is normal in structure. Aortic valve regurgitation is not visualized. Mild aortic valve stenosis.  7. The inferior vena cava is normal in size with greater than 50% respiratory variability, suggesting right atrial pressure of 3 mmHg. FINDINGS  Left Ventricle: Left ventricular ejection fraction, by estimation, is 35 to 40%. The left ventricle has moderately decreased function. The left ventricle demonstrates regional wall motion abnormalities. Strain was performed and the global  longitudinal strain is indeterminate. The left ventricular internal cavity size was normal in size. There is no left ventricular hypertrophy. Left ventricular diastolic parameters are indeterminate.  LV Wall Scoring: The mid inferoseptal segment, apical septal segment, and apex are hypokinetic. Right Ventricle: The right ventricular size is normal. No increase in right ventricular wall thickness. Right ventricular systolic function is normal. Left Atrium: Left atrial size was mildly dilated. Right Atrium: Right atrial size was moderately dilated. Pericardium: There is no evidence of pericardial effusion. Mitral Valve: The mitral valve is normal in structure. No evidence of mitral valve regurgitation. No evidence of mitral valve stenosis. Tricuspid Valve: The tricuspid valve is normal in structure. Tricuspid valve regurgitation is not demonstrated. No evidence of tricuspid stenosis. Aortic Valve: The aortic valve is normal in structure. Aortic valve regurgitation is not visualized. Mild aortic stenosis is present. Aortic valve mean gradient measures 7.0 mmHg. Aortic valve peak gradient measures 10.9 mmHg. Aortic valve area, by VTI measures 1.24 cm. Pulmonic Valve: The pulmonic valve was normal in structure. Pulmonic valve regurgitation is not visualized. No evidence of pulmonic stenosis. Aorta: The aortic root is normal in size and structure. Venous: The inferior vena cava is normal in size with greater than 50% respiratory variability, suggesting right atrial pressure of 3 mmHg. IAS/Shunts: No atrial level shunt detected by color flow Doppler. Additional Comments: 3D was performed not requiring image post processing on an independent workstation and was indeterminate.  LEFT VENTRICLE PLAX 2D LVIDd:         3.30 cm LVIDs:         2.70 cm LV PW:         1.10 cm LV IVS:        1.50 cm LVOT diam:     2.00 cm LV SV:         31 LV SV Index:   22 LVOT Area:     3.14 cm  RIGHT VENTRICLE RV Basal diam:  3.50 cm RV Mid  diam:    2.90 cm RV S prime:     12.40 cm/s TAPSE (M-mode): 1.2 cm LEFT ATRIUM             Index        RIGHT ATRIUM  Index LA diam:        2.80 cm 2.02 cm/m   RA Area:     15.70 cm LA Vol (A2C):   22.5 ml 16.20 ml/m  RA Volume:   41.30 ml  29.73 ml/m LA Vol (A4C):   12.5 ml 9.00 ml/m LA Biplane Vol: 17.8 ml 12.81 ml/m  AORTIC VALVE AV Area (Vmax):    1.17 cm AV Area (Vmean):   1.13 cm AV Area (VTI):     1.24 cm AV Vmax:           165.33 cm/s AV Vmean:          119.667 cm/s AV VTI:            0.251 m AV Peak Grad:      10.9 mmHg AV Mean Grad:      7.0 mmHg LVOT Vmax:         61.50 cm/s LVOT Vmean:        43.200 cm/s LVOT VTI:          0.099 m LVOT/AV VTI ratio: 0.39  AORTA Ao Root diam: 2.80 cm MITRAL VALVE               TRICUSPID VALVE MV Area (PHT): 6.96 cm    TR Peak grad:   21.3 mmHg MV Decel Time: 109 msec    TR Vmax:        231.00 cm/s MV E velocity: 87.50 cm/s                            SHUNTS                            Systemic VTI:  0.10 m                            Systemic Diam: 2.00 cm Marcina Millard MD Electronically signed by Marcina Millard MD Signature Date/Time: 10/28/2023/1:43:00 PM    Final    DG Chest Portable 1 View Result Date: 10/28/2023 CLINICAL DATA:  Shortness of breath. EXAM: PORTABLE CHEST 1 VIEW COMPARISON:  10/17/2023 FINDINGS: Interstitial markings are diffusely coarsened with chronic features. Interval development of interstitial and alveolar opacity in the right upper lung. Small bilateral pleural effusions suspected. The cardiopericardial silhouette is within normal limits for size. Bones are diffusely demineralized. Telemetry leads overlie the chest. IMPRESSION: 1. Interval development of interstitial and alveolar opacity in the right upper lung, suspicious for pneumonia although asymmetric edema is also a consideration. 2. Small bilateral pleural effusions. Electronically Signed   By: Kennith Center M.D.   On: 10/28/2023 05:12        Scheduled  Meds:  amiodarone  400 mg Oral BID   Followed by   Melene Muller ON 11/05/2023] amiodarone  200 mg Oral Daily   apixaban  2.5 mg Oral BID   ARIPiprazole  2 mg Oral Q breakfast   aspirin EC  81 mg Oral Daily   budesonide (PULMICORT) nebulizer solution  2 mg Nebulization Q12H   dapagliflozin propanediol  10 mg Oral Daily   erythromycin  1 Application Both Eyes QHS   ipratropium  0.5 mg Nebulization Q6H   mometasone-formoterol  2 puff Inhalation BID   nystatin  1 Application Topical BID   pravastatin  20 mg Oral q1800   predniSONE  40 mg Oral Q breakfast  Continuous Infusions:  amiodarone 30 mg/hr (10/29/23 0839)   nitroGLYCERIN Stopped (10/28/23 0703)     LOS: 1 day     Tresa Moore, MD [facility]   If 7PM-7AM, please contact night-coverage  10/29/2023, 1:07 PM

## 2023-10-29 NOTE — Progress Notes (Addendum)
 [facility] CLINIC CARDIOLOGY PROGRESS NOTE       [name] MRN: [id number] DOB/AGE: [birth year] 88 y.o.  Admit date: 10/28/2023 Referring Physician Dr. Herma Mering Primary Physician [name], MD  Primary Cardiologist None Reason for Consultation AF RVR, AoCHF  HPI: Angela Mason is a 89 y.o. female  with a past medical history of paroxysmal atrial fibrillation diagnosed last month, hypertension, hyperlipidemia, COPD, hypothyroidism who presented to the ED on 10/28/2023 for shortness of breath. Cardiology was consulted for further evaluation.   Interval history: -Patient seen and examined this morning, resting comfortably in ED stretcher. -States she is not sure how she is feeling this morning but appears more comfortable. -O2 is being weaned and she is on nasal cannula at this time.  Denies any shortness of breath. -Remains in normal sinus rhythm this morning with controlled heart rates. -Renal function stable with diuresis, appears euvolemic.  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) (HCC)    HLD (hyperlipidemia)    HTN (hypertension)    Hypothyroidism    Macular degeneration, bilateral    Paroxysmal atrial fibrillation (HCC)    Varicosities     Past Surgical History:  Procedure Laterality Date   CATARACT EXTRACTION W/PHACO Right 02/03/2017   Procedure: CATARACT EXTRACTION PHACO AND INTRAOCULAR LENS PLACEMENT (IOC);  Surgeon: Galen Manila, MD;  Location: [facility] ORS;  Service: Ophthalmology;  Laterality: Right;  [place] 00:57.2 AP% 18.5 CDE 10.58 Fluid pack lot # [id number]   CATARACT EXTRACTION W/PHACO Left 02/24/2017   Procedure: CATARACT EXTRACTION PHACO AND INTRAOCULAR LENS PLACEMENT (IOC);  Surgeon: Galen Manila, MD;  Location: [facility] ORS;  Service:  Ophthalmology;  Laterality: Left;  [place] 01:12 AP% 25.0 CDE 18.03 Fluid pack lot # [id number]   MOHS SURGERY  2015   nose   SALIVARY GLAND SURGERY Left 2012   TONSILLECTOMY  1940    (Not in a hospital admission)  Social History   Socioeconomic History   Marital status: Widowed    Spouse name: Not on file   Number of children: Not on file   Years of education: Not on file   Highest education level: Not on file  Occupational History   Not on file  Tobacco Use   Smoking status: Former    Current packs/day: 0.00    Types: Cigarettes    Quit date: 09/05/2005    Years since quitting: 18.1   Smokeless tobacco: Never  Vaping Use   Vaping status: Never Used  Substance and Sexual Activity   Alcohol use: Not Currently   Drug use: No   Sexual activity: Not Currently    Birth control/protection: Post-menopausal  Other Topics Concern   Not on file  Social History Narrative   Not on file   Social Drivers of Health   Financial Resource Strain: Not on file  Food Insecurity: No Food Insecurity (10/28/2023)   Hunger Vital Sign    Worried About Running Out of Food in the Last Year: Never true    Ran Out of Food in the Last Year: Never true  Transportation Needs: No Transportation Needs (10/28/2023)   PRAPARE - Administrator, Civil Service (Medical): No    Lack of Transportation (Non-Medical): No  Physical  Activity: Not on file  Stress: Not on file  Social Connections: Socially Isolated (10/28/2023)   Social Connection and Isolation Panel [NHANES]    Frequency of Communication with Friends and Family: More than three times a week    Frequency of Social Gatherings with Friends and Family: Twice a week    Attends Religious Services: Never    Database administrator or Organizations: No    Attends Banker Meetings: Never    Marital Status: Widowed  Intimate Partner Violence: Not At Risk (10/28/2023)   Humiliation, Afraid, Rape, and Kick questionnaire    Fear of Current  or Ex-Partner: No    Emotionally Abused: No    Physically Abused: No    Sexually Abused: No    Family History  Problem Relation Age of Onset   Diverticulitis Mother    Macular degeneration Mother    COPD Father    Heart failure Father    Epilepsy Sister    Parkinson's disease Brother    Cancer Neg Hx    Diabetes Neg Hx    Breast cancer Neg Hx      Vitals:   10/29/23 0435 10/29/23 0738 10/29/23 0845 10/29/23 0850  BP:   117/74   Pulse:   73   Resp:   (!) 24   Temp: 98.4 F (36.9 C)   97.9 F (36.6 C)  TempSrc: Oral   Oral  SpO2:  100% 97%   Height:        PHYSICAL EXAM General: Ill-appearing elderly female, well nourished, in no acute distress. HEENT: Normocephalic and atraumatic. Neck: No JVD.  Lungs: On 1 L nasal cannula.  Clear bilaterally to auscultation.  Heart: HRRR. Normal S1 and S2 without gallops or murmurs.  Abdomen: Non-distended appearing.  Msk: Normal strength and tone for age. Extremities: Warm and well perfused. No clubbing, cyanosis.  No edema.  Neuro: Alert and oriented X 3. Psych: Answers questions appropriately.   Labs: Basic Metabolic Panel: Recent Labs    10/28/23 0500 10/29/23 0321  NA 140 139  K 4.3 3.7  CL 98 97*  CO2 32 35*  GLUCOSE 217* 151*  BUN 26* 34*  CREATININE 1.10* 1.02*  CALCIUM 8.9 8.1*   Liver Function Tests: No results for input(s): "AST", "ALT", "ALKPHOS", "BILITOT", "PROT", "ALBUMIN" in the last 72 hours. No results for input(s): "LIPASE", "AMYLASE" in the last 72 hours. CBC: Recent Labs    10/28/23 0500 10/29/23 0321  WBC 13.3* 9.9  NEUTROABS 10.0*  --   HGB 13.0 9.5*  HCT 42.6 29.7*  MCV 105.7* 101.4*  PLT 298 187   Cardiac Enzymes: Recent Labs    10/28/23 0500 10/28/23 0743 10/28/23 1418  TROPONINIHS 81* 148* 150*   BNP: Recent Labs    10/28/23 0500  BNP 1,228.0*   D-Dimer: No results for input(s): "DDIMER" in the last 72 hours. Hemoglobin A1C: No results for input(s): "HGBA1C" in the  last 72 hours. Fasting Lipid Panel: No results for input(s): "CHOL", "HDL", "LDLCALC", "TRIG", "CHOLHDL", "LDLDIRECT" in the last 72 hours. Thyroid Function Tests: No results for input(s): "TSH", "T4TOTAL", "T3FREE", "THYROIDAB" in the last 72 hours.  Invalid input(s): "FREET3" Anemia Panel: No results for input(s): "VITAMINB12", "FOLATE", "FERRITIN", "TIBC", "IRON", "RETICCTPCT" in the last 72 hours.   Radiology: ECHOCARDIOGRAM COMPLETE Result Date: 10/28/2023    ECHOCARDIOGRAM REPORT   Patient Name:   Angela Mason Date of Exam: 10/28/2023 Medical Rec #:  [id number]       Height:  60.0 in Accession #:    [id number]      Weight:       99.8 lb Date of Birth:  [birth year]       BSA:          1.389 m Patient Age:    89 years        BP:           96/64 mmHg Patient Gender: F               HR:           82 bpm. Exam Location:  [facility] Procedure: 2D Echo, Cardiac Doppler and Color Doppler (Both Spectral and Color            Flow Doppler were utilized during procedure). Indications:     Abnormal ECG R94.31  History:         Patient has prior history of Echocardiogram examinations, most                  recent 10/18/2023. COPD; Risk Factors:Hypertension. Paroxysmal                  Afib.  Sonographer:     Cristela Blue Referring Phys:  [id number] CHRISTOPHER END Diagnosing Phys: Marcina Millard MD IMPRESSIONS  1. Left ventricular ejection fraction, by estimation, is 35 to 40%. The left ventricle has moderately decreased function. The left ventricle demonstrates regional wall motion abnormalities (see scoring diagram/findings for description). Left ventricular  diastolic parameters are indeterminate.  2. Right ventricular systolic function is normal. The right ventricular size is normal.  3. Left atrial size was mildly dilated.  4. Right atrial size was moderately dilated.  5. The mitral valve is normal in structure. No evidence of mitral valve regurgitation. No evidence of mitral stenosis.  6. The aortic valve is  normal in structure. Aortic valve regurgitation is not visualized. Mild aortic valve stenosis.  7. The inferior vena cava is normal in size with greater than 50% respiratory variability, suggesting right atrial pressure of 3 mmHg. FINDINGS  Left Ventricle: Left ventricular ejection fraction, by estimation, is 35 to 40%. The left ventricle has moderately decreased function. The left ventricle demonstrates regional wall motion abnormalities. Strain was performed and the global longitudinal strain is indeterminate. The left ventricular internal cavity size was normal in size. There is no left ventricular hypertrophy. Left ventricular diastolic parameters are indeterminate.  LV Wall Scoring: The mid inferoseptal segment, apical septal segment, and apex are hypokinetic. Right Ventricle: The right ventricular size is normal. No increase in right ventricular wall thickness. Right ventricular systolic function is normal. Left Atrium: Left atrial size was mildly dilated. Right Atrium: Right atrial size was moderately dilated. Pericardium: There is no evidence of pericardial effusion. Mitral Valve: The mitral valve is normal in structure. No evidence of mitral valve regurgitation. No evidence of mitral valve stenosis. Tricuspid Valve: The tricuspid valve is normal in structure. Tricuspid valve regurgitation is not demonstrated. No evidence of tricuspid stenosis. Aortic Valve: The aortic valve is normal in structure. Aortic valve regurgitation is not visualized. Mild aortic stenosis is present. Aortic valve mean gradient measures 7.0 mmHg. Aortic valve peak gradient measures 10.9 mmHg. Aortic valve area, by VTI measures 1.24 cm. Pulmonic Valve: The pulmonic valve was normal in structure. Pulmonic valve regurgitation is not visualized. No evidence of pulmonic stenosis. Aorta: The aortic root is normal in size and structure. Venous: The inferior vena cava is normal in size with greater than  50% respiratory variability,  suggesting right atrial pressure of 3 mmHg. IAS/Shunts: No atrial level shunt detected by color flow Doppler. Additional Comments: 3D was performed not requiring image post processing on an independent workstation and was indeterminate.  LEFT VENTRICLE PLAX 2D LVIDd:         3.30 cm LVIDs:         2.70 cm LV PW:         1.10 cm LV IVS:        1.50 cm LVOT diam:     2.00 cm LV SV:         31 LV SV Index:   22 LVOT Area:     3.14 cm  RIGHT VENTRICLE RV Basal diam:  3.50 cm RV Mid diam:    2.90 cm RV S prime:     12.40 cm/s TAPSE (M-mode): 1.2 cm LEFT ATRIUM             Index        RIGHT ATRIUM           Index LA diam:        2.80 cm 2.02 cm/m   RA Area:     15.70 cm LA Vol (A2C):   22.5 ml 16.20 ml/m  RA Volume:   41.30 ml  29.73 ml/m LA Vol (A4C):   12.5 ml 9.00 ml/m LA Biplane Vol: 17.8 ml 12.81 ml/m  AORTIC VALVE AV Area (Vmax):    1.17 cm AV Area (Vmean):   1.13 cm AV Area (VTI):     1.24 cm AV Vmax:           165.33 cm/s AV Vmean:          119.667 cm/s AV VTI:            0.251 m AV Peak Grad:      10.9 mmHg AV Mean Grad:      7.0 mmHg LVOT Vmax:         61.50 cm/s LVOT Vmean:        43.200 cm/s LVOT VTI:          0.099 m LVOT/AV VTI ratio: 0.39  AORTA Ao Root diam: 2.80 cm MITRAL VALVE               TRICUSPID VALVE MV Area (PHT): 6.96 cm    TR Peak grad:   21.3 mmHg MV Decel Time: 109 msec    TR Vmax:        231.00 cm/s MV E velocity: 87.50 cm/s                            SHUNTS                            Systemic VTI:  0.10 m                            Systemic Diam: 2.00 cm Marcina Millard MD Electronically signed by Marcina Millard MD Signature Date/Time: 10/28/2023/1:43:00 PM    Final    DG Chest Portable 1 View Result Date: 10/28/2023 CLINICAL DATA:  Shortness of breath. EXAM: PORTABLE CHEST 1 VIEW COMPARISON:  10/17/2023 FINDINGS: Interstitial markings are diffusely coarsened with chronic features. Interval development of interstitial and alveolar opacity in the right upper lung. Small  bilateral pleural effusions suspected. The cardiopericardial silhouette  is within normal limits for size. Bones are diffusely demineralized. Telemetry leads overlie the chest. IMPRESSION: 1. Interval development of interstitial and alveolar opacity in the right upper lung, suspicious for pneumonia although asymmetric edema is also a consideration. 2. Small bilateral pleural effusions. Electronically Signed   By: Kennith Center M.D.   On: 10/28/2023 05:12   ECHOCARDIOGRAM COMPLETE Result Date: 10/18/2023    ECHOCARDIOGRAM REPORT   Patient Name:   Angela Mason Date of Exam: 10/18/2023 Medical Rec #:  [id number]       Height:       60.0 in Accession #:    [id number]      Weight:       95.0 lb Date of Birth:  1932-08-31       BSA:          1.360 m Patient Age:    89 years        BP:           118/83 mmHg Patient Gender: F               HR:           116 bpm. Exam Location:  [facility] Procedure: 2D Echo, Cardiac Doppler and Color Doppler (Both Spectral and Color            Flow Doppler were utilized during procedure). Indications:     CHF- Acute Diastolic I50.31  History:         Patient has no prior history of Echocardiogram examinations.  Sonographer:     Elwin Sleight RDCS Referring Phys:  Wynona Neat NIU Diagnosing Phys: Adrian Blackwater  Sonographer Comments: Image acquisition challenging due to respiratory motion. IMPRESSIONS  1. Left ventricular ejection fraction, by estimation, is 45 to 50%. The left ventricle has mildly decreased function. The left ventricle demonstrates global hypokinesis. The left ventricular internal cavity size was moderately dilated. There is mild concentric left ventricular hypertrophy. Left ventricular diastolic parameters are indeterminate.  2. Right ventricular systolic function is moderately reduced. The right ventricular size is moderately enlarged. Mildly increased right ventricular wall thickness.  3. Left atrial size was severely dilated.  4. Right atrial size was severely dilated.  5. The  mitral valve is grossly normal. Moderate mitral valve regurgitation.  6. Tricuspid valve regurgitation is mild to moderate.  7. The aortic valve is calcified. Aortic valve regurgitation is not visualized. Aortic valve sclerosis/calcification is present, without any evidence of aortic stenosis. FINDINGS  Left Ventricle: Left ventricular ejection fraction, by estimation, is 45 to 50%. The left ventricle has mildly decreased function. The left ventricle demonstrates global hypokinesis. Strain was performed and the global longitudinal strain is indeterminate. The left ventricular internal cavity size was moderately dilated. There is mild concentric left ventricular hypertrophy. Left ventricular diastolic parameters are indeterminate. Right Ventricle: The right ventricular size is moderately enlarged. Mildly increased right ventricular wall thickness. Right ventricular systolic function is moderately reduced. Left Atrium: Left atrial size was severely dilated. Right Atrium: Right atrial size was severely dilated. Pericardium: There is no evidence of pericardial effusion. Mitral Valve: The mitral valve is grossly normal. Moderate mitral valve regurgitation. Tricuspid Valve: The tricuspid valve is grossly normal. Tricuspid valve regurgitation is mild to moderate. Aortic Valve: The aortic valve is calcified. Aortic valve regurgitation is not visualized. Aortic valve sclerosis/calcification is present, without any evidence of aortic stenosis. Aortic valve peak gradient measures 10.6 mmHg. Pulmonic Valve: The pulmonic valve was grossly normal. Pulmonic valve regurgitation is mild. Aorta:  The aortic root, ascending aorta and aortic arch are all structurally normal, with no evidence of dilitation or obstruction. IAS/Shunts: No atrial level shunt detected by color flow Doppler. Additional Comments: 3D was performed not requiring image post processing on an independent workstation and was indeterminate.  LEFT VENTRICLE PLAX 2D  LVIDd:         3.20 cm   Diastology LVIDs:         2.40 cm   LV e' lateral: 12.40 cm/s LV PW:         1.10 cm LV IVS:        1.00 cm LVOT diam:     1.90 cm LV SV:         29 LV SV Index:   22 LVOT Area:     2.84 cm  RIGHT VENTRICLE RV Basal diam:  2.80 cm LEFT ATRIUM             Index        RIGHT ATRIUM           Index LA diam:        3.50 cm 2.57 cm/m   RA Area:     17.00 cm LA Vol (A2C):   32.7 ml 24.04 ml/m  RA Volume:   48.00 ml  35.29 ml/m LA Vol (A4C):   39.2 ml 28.82 ml/m LA Biplane Vol: 39.8 ml 29.26 ml/m  AORTIC VALVE                 PULMONIC VALVE AV Area (Vmax): 1.29 cm     PV Vmax:        0.64 m/s AV Vmax:        162.50 cm/s  PV Peak grad:   1.6 mmHg AV Peak Grad:   10.6 mmHg    RVOT Peak grad: 1 mmHg LVOT Vmax:      73.90 cm/s LVOT Vmean:     50.100 cm/s LVOT VTI:       0.104 m  AORTA Ao Root diam: 2.80 cm TRICUSPID VALVE TR Peak grad:   33.6 mmHg TR Vmax:        290.00 cm/s  SHUNTS Systemic VTI:  0.10 m Systemic Diam: 1.90 cm Adrian Blackwater Electronically signed by Adrian Blackwater Signature Date/Time: 10/18/2023/12:36:21 PM    Final    CT Angio Chest PE W and/or Wo Contrast Result Date: 10/17/2023 CLINICAL DATA:  Shortness of breath.  History of COPD.  Ex-smoker. EXAM: CT ANGIOGRAPHY CHEST WITH CONTRAST TECHNIQUE: Multidetector CT imaging of the chest was performed using the standard protocol during bolus administration of intravenous contrast. Multiplanar CT image reconstructions and MIPs were obtained to evaluate the vascular anatomy. RADIATION DOSE REDUCTION: This exam was performed according to the departmental dose-optimization program which includes automated exposure control, adjustment of the mA and/or kV according to patient size and/or use of iterative reconstruction technique. CONTRAST:  60mL OMNIPAQUE IOHEXOL 350 MG/ML SOLN COMPARISON:  Plain film of earlier today.  No prior CT. FINDINGS: Cardiovascular: The quality of this exam for evaluation of pulmonary embolism is good. Although  the bolus is well timed, limitations include minimal motion, EKG wire artifact and patient arm position, not raised above the head. No evidence of pulmonary embolism. Advanced aortic and branch vessel atherosclerosis. The aorta is not well opacified secondary to bolus timing. Moderate cardiomegaly, without pericardial effusion. Left main and 3 vessel coronary artery calcification. Mediastinum/Nodes: No mediastinal or hilar adenopathy. Lungs/Pleura: Small bilateral pleural effusions. Moderate centrilobular emphysema. Right  middle lobe endobronchial compression/narrowing at the level of the proximal segmental bronchi including on 68/4. This results in right middle lobe collapse. No well-defined obstructive mass. Biapical pleuroparenchymal scarring. Left greater than right base dependent compressive atelectasis. Upper Abdomen: Reflux of contrast into the hepatic veins suggests elevated right heart pressures. Artifact degradation continuing into the upper abdomen. No gross acute finding. Musculoskeletal: Lower thoracic spondylosis with S-shaped thoracolumbar spine curvature. Review of the MIP images confirms the above findings. IMPRESSION: 1.  No evidence of pulmonary embolism. 2. Mild multifocal degradation as detailed above. 3. Small bilateral pleural effusions. Findings of elevated right heart pressures. Suspect a component of fluid overload. 4. Right middle lobe endobronchial obstruction or compression at the level of the proximal subsegmental bronchi resulting in right middle lobe collapse. No well-defined obstructive mass. Consider further evaluation with bronchoscopy with attention to the right middle lobe bronchus. Alternatively, especially given patient age and comorbidities, CT follow-up at 6 months could be performed. 5. Aortic atherosclerosis (ICD10-I70.0), coronary artery atherosclerosis and emphysema (ICD10-J43.9). Electronically Signed   By: Jeronimo Greaves M.D.   On: 10/17/2023 18:40   DG Chest 2  View Result Date: 10/17/2023 CLINICAL DATA:  Shortness of breath EXAM: CHEST - 2 VIEW COMPARISON:  Chest radiograph dated 06/26/2022 FINDINGS: Normal lung volumes. No focal consolidations. Trace blunting of bilateral costophrenic angles. No pneumothorax. Similar mildly enlarged cardiomediastinal silhouette. No acute osseous abnormality. Unchanged compression deformity of T4. IMPRESSION: 1. Small bilateral pleural effusions. 2. Similar mild cardiomegaly. Electronically Signed   By: Agustin Cree M.D.   On: 10/17/2023 15:23    ECHO as above  TELEMETRY reviewed by me 10/29/2023: Sinus rhythm rate 70s  EKG reviewed by me: Initial EKG in the ED sinus tach, repeat this a.m. atrial fibrillation rate 121 bpm  Data reviewed by me 10/29/2023: last 24h vitals tele labs imaging I/O hospitalist progress note  Principal Problem:   Afib (HCC) Active Problems:   A-fib (HCC)   COPD with acute exacerbation (HCC)    ASSESSMENT AND PLAN:  Angela Mason is a 89 y.o. female  with a past medical history of paroxysmal atrial fibrillation diagnosed last month, hypertension, hyperlipidemia, COPD, hypothyroidism who presented to the ED on 10/28/2023 for shortness of breath. Cardiology was consulted for further evaluation.   # Atrial fibrillation RVR # Recent onset atrial fibrillation # Acute on chronic HFmrEF # Acute hypoxic respiratory failure Patient with recent hospitalization for A-fib RVR and acute heart failure presenting back with shortness of breath and increased work of breathing.  Suspect heart failure exacerbation with BNP elevated at 1200.  Also in A-fib RVR on telemetry in the ED. -Transition to p.o. amiodarone load today. -Discontinue heparin and start Eliquis 2.5 mg twice daily given age and weight. -1 dose of IV Lasix this morning, will defer further dosing.  Consider p.o. dosing tomorrow. -Continue Farxiga 10 mg daily.  Plan to resume metoprolol as able. -Continue pravastatin 20 mg daily. -Mildly  elevated troponins most consistent with demand/supply mismatch and not ACS in the setting of acute heart failure, acute respiratory failure, A-fib RVR. -Palliative consulted by primary service.   This patient's plan of care was discussed and created with Dr. Darrold Junker and he is in agreement.  Signed: Gale Journey, PA-C  10/29/2023, 9:15 AM [facility] Cardiology

## 2023-10-29 NOTE — Consult Note (Signed)
 Consultation Note Date: 10/29/2023 at 1130  Patient Name: Angela Mason  DOB: [birth year]  MRN: [id number]  Age / Sex: 89 y.o., female  PCP: [name], MD Referring Physician: Tresa Moore, MD  HPI/Patient Profile: 89 y.o. female  with past medical history of proximal A-fib (Eliquis), HTN, HLD, COPD, hypothyroidism, CHF (EF of 35 to 40%), bilateral macular degeneration, skin cancer (nose, Mohs procedure 2015), depression, former smoker, and recent hospitalization ([facility] - 3/22-3/26 AECOPD and CHF exacerbation) admitted on 10/28/2023 with [name].  Patient has been diagnosed with acute exacerbation of congestive heart failure and COPD.  Additionally, patient found to be in A-fib with RVR and elevated troponins.  Patient being treated with BiPAP (now down to nasal cannula), heparin and amiodarone gtt., steroids, and close monitoring.  PMT was consulted to discuss boundaries and goals of care.   Clinical Assessment and Goals of Care: Extensive chart review completed prior to meeting patient including labs, vital signs, imaging, progress notes, orders, and available advanced directive documents from current and previous encounters. I then met with patient and her daughter [name] at bedside in ED to discuss diagnosis prognosis, GOC, EOL wishes, disposition and options.  I introduced Palliative Medicine as specialized medical care for people living with serious illness. It focuses on providing relief from the symptoms and stress of a serious illness. The goal is to improve quality of life for both the patient and the family.  We discussed a brief life review of the patient.  She is widowed (husband passed 2 years ago) and a retired Airline pilot from [place].  She moved to [place] to have lower taxes in for a better cost of living.  She has 1 daughter, who resides currently in [place].  Patient lives  in her own home but has recently been at [facility] for rehab since previous hospitalization in late March.  As far as functional and nutritional status patient endorses she was independent with use of a walker and no supplemental oxygen prior to these back-to-back hospitalizations.  She denies difficulty with p.o. intake or poor nutrition.  We discussed patient's current illness and what it means in the larger context of patient's on-going co-morbidities.  Discussed patient's COPD and CHF.  Patient and daughter share they were not aware that patient had COPD until most recent hospitalizations.    Extensive education provided on chronic, irreversible, and progressive nature of both CHF and COPD.  Discussed ejection fraction, not hoping for a cure but for extending time between exacerbations, and optimizing current medical status given weakened heart and lungs.    Space and opportunity provided for patient and daughter to ask questions and voiced their concerns regarding CHF and COPD as well as current hospitalization.  Questions and concerns were addressed.  Patient shares concerns that she does not know what medications she is on.  MAR reviewed and medications discussed in detail.  I attempted to elicit values and goals of care important to the patient.  Patient and daughter are hopeful to  be able to discharge to [facility] at discharge.  Advance directives, concepts specific to code status, artificial feeding and hydration, and rehospitalization were considered and discussed.  Patient vocalizes that she names her daughter [name] as her next of kin decision maker in the event that she is unable to speak for herself.  Additionally, [name] shares that patient has created [employer] paperwork naming [place] as [employer].  I attempted to elicit values and goals important to the patient.  After extensive discussion of CODE STATUS, patient states that she thinks she would "never want to go through  all of that".  She endorses she does not believe that of ventilator would "do me any good".  However, her daughter shares that we are speaking in hypotheticals and that these decisions, while important to have an advance, can also be made when situations arise.  Discussed importance of advance care planning to avoid decisions made in an emergency or in haste.  Patient and daughter were appreciative of our discussion.  At this time, patient and daughter are in agreement for patient to remain full code.  Education offered regarding concept specific to human mortality and the limitations of medical interventions to prolong life when the body begins to fail to thrive.   Discussed with patient/family the importance of continued conversation with family and the medical providers regarding overall plan of care and treatment options, ensuring decisions are within the context of the patient's values and GOCs.    Patient and daughter appreciative of my visit and were in agreement for PMT to continue to follow and support throughout her hospitalization.  Primary Decision Maker PATIENT  Physical Exam Vitals reviewed.  Constitutional:      General: She is not in acute distress.    Appearance: She is normal weight.  HENT:     Head: Normocephalic.     Nose: Nose normal.     Mouth/Throat:     Mouth: Mucous membranes are moist.  Eyes:     Pupils: Pupils are equal, round, and reactive to light.  Cardiovascular:     Rate and Rhythm: Normal rate and regular rhythm.  Pulmonary:     Effort: No respiratory distress.     Breath sounds: No wheezing.     Comments: 1L [place] in place Abdominal:     Palpations: Abdomen is soft.  Skin:    General: Skin is warm and dry.  Neurological:     Mental Status: She is alert and oriented to person, place, and time.  Psychiatric:        Mood and Affect: Mood normal.        Behavior: Behavior normal.        Thought Content: Thought content normal.        Judgment:  Judgment normal.     Palliative Assessment/Data: 50-60%     Thank you for this consult. Palliative medicine will continue to follow and assist holistically.   Time Total: 75 minutes  Time spent includes: Detailed review of medical records (labs, imaging, vital signs), medically appropriate exam (mental status, respiratory, cardiac, skin), discussed with treatment team, counseling and educating patient, family and staff, documenting clinical information, medication management and coordination of care.  Signed by: Georgiann Cocker, DNP, FNP-BC Palliative Medicine   Please contact Palliative Medicine Team providers via [facility] for questions and concerns.

## 2023-10-29 NOTE — Progress Notes (Signed)
 Heart Failure Navigator Progress Note  Assessed for Heart & Vascular TOC clinic readiness.  Patient does not meet criteria due to [facility] assuming care of patient as the patient has been followed in the past by them.   Navigator will sign off at this time.  Roxy Horseman, RN, BSN [facility] Heart Failure Navigator Secure Chat Only

## 2023-10-29 NOTE — ED Notes (Signed)
 This NT assisted with changing patient with nurse Osborne Casco. Patient was wet. Purewick has been put back into place. Patient has a clean gown, a clean fitted sheet, a clean chux and clean blankets.

## 2023-10-30 DIAGNOSIS — J441 Chronic obstructive pulmonary disease with (acute) exacerbation: Secondary | ICD-10-CM | POA: Diagnosis not present

## 2023-10-30 DIAGNOSIS — I480 Paroxysmal atrial fibrillation: Secondary | ICD-10-CM | POA: Diagnosis not present

## 2023-10-30 DIAGNOSIS — I509 Heart failure, unspecified: Secondary | ICD-10-CM | POA: Diagnosis not present

## 2023-10-30 DIAGNOSIS — J9601 Acute respiratory failure with hypoxia: Secondary | ICD-10-CM | POA: Diagnosis not present

## 2023-10-30 LAB — BASIC METABOLIC PANEL WITH GFR
Anion gap: 8 (ref 5–15)
BUN: 36 mg/dL — ABNORMAL HIGH (ref 8–23)
CO2: 35 mmol/L — ABNORMAL HIGH (ref 22–32)
Calcium: 8.2 mg/dL — ABNORMAL LOW (ref 8.9–10.3)
Chloride: 92 mmol/L — ABNORMAL LOW (ref 98–111)
Creatinine, Ser: 0.93 mg/dL (ref 0.44–1.00)
GFR, Estimated: 59 mL/min — ABNORMAL LOW (ref >60)
Glucose, Bld: 129 mg/dL — ABNORMAL HIGH (ref 70–99)
Potassium: 3.1 mmol/L — ABNORMAL LOW (ref 3.5–5.1)
Sodium: 135 mmol/L (ref 135–145)

## 2023-10-30 LAB — MRSA NEXT GEN BY PCR, NASAL: MRSA by PCR Next Gen: NOT DETECTED

## 2023-10-30 MED ORDER — MAGNESIUM SULFATE 2 GM/50ML IV SOLN
2.0000 g | Freq: Once | INTRAVENOUS | Status: AC
  Administered 2023-10-30: 2 g via INTRAVENOUS
  Filled 2023-10-30: qty 50

## 2023-10-30 MED ORDER — IPRATROPIUM BROMIDE 0.02 % IN SOLN
0.5000 mg | Freq: Two times a day (BID) | RESPIRATORY_TRACT | Status: DC
  Administered 2023-10-31: 0.5 mg via RESPIRATORY_TRACT
  Filled 2023-10-30: qty 2.5

## 2023-10-30 MED ORDER — METOPROLOL SUCCINATE ER 25 MG PO TB24
12.5000 mg | ORAL_TABLET | Freq: Two times a day (BID) | ORAL | Status: DC
  Administered 2023-10-30 – 2023-10-31 (×3): 12.5 mg via ORAL
  Filled 2023-10-30 (×3): qty 1

## 2023-10-30 MED ORDER — FUROSEMIDE 20 MG PO TABS
20.0000 mg | ORAL_TABLET | Freq: Every day | ORAL | Status: DC
  Administered 2023-10-30 – 2023-10-31 (×2): 20 mg via ORAL
  Filled 2023-10-30 (×3): qty 1

## 2023-10-30 MED ORDER — POTASSIUM CHLORIDE CRYS ER 20 MEQ PO TBCR
40.0000 meq | EXTENDED_RELEASE_TABLET | ORAL | Status: AC
  Administered 2023-10-30 (×2): 40 meq via ORAL
  Filled 2023-10-30 (×2): qty 2

## 2023-10-30 NOTE — Plan of Care (Signed)

## 2023-10-30 NOTE — Progress Notes (Signed)
 Patient admitted to room 257. Pt Aox4. VSS. Pt reports no pain resting in bed. On 2L [place] saturating 95%. 2 RN skin assessment complete. Oriented to standard equipment. Bed at lowest position. Call bell within reach. Pt declined to call daughter at this time.

## 2023-10-30 NOTE — TOC Initial Note (Signed)
 Transition of Care [facility]) - Initial/Assessment Note    Patient Details  Name: Angela Mason MRN: [id number] Date of Birth: 1933-04-14  [facility] CM/SW Contact:    Truddie Hidden, RN Phone Number: 10/30/2023, 9:50 AM  Clinical Narrative:                 Patient admits from [facility] for STR.         Patient Goals and CMS Choice            Expected Discharge Plan and Services                                              Prior Living Arrangements/Services                       Activities of Daily Living   ADL Screening (condition at time of admission) Independently performs ADLs?: No Does the patient have a NEW difficulty with bathing/dressing/toileting/self-feeding that is expected to last >3 days?: No Does the patient have a NEW difficulty with getting in/out of bed, walking, or climbing stairs that is expected to last >3 days?: No Does the patient have a NEW difficulty with communication that is expected to last >3 days?: No Is the patient deaf or have difficulty hearing?: No Does the patient have difficulty seeing, even when wearing glasses/contacts?: No Does the patient have difficulty concentrating, remembering, or making decisions?: No  Permission Sought/Granted                  Emotional Assessment              Admission diagnosis:  Respiratory acidosis [E87.29] Afib (HCC) [I48.91] COPD with acute exacerbation (HCC) [J44.1] Acute respiratory failure with hypoxia and hypercapnia (HCC) [J96.01, J96.02] Acute on chronic congestive heart failure, unspecified heart failure type (HCC) [I50.9] Acute hypoxic respiratory failure (HCC) [J96.01] Patient Active Problem List   Diagnosis Date Noted   Acute hypoxic respiratory failure (HCC) 10/29/2023   Afib (HCC) 10/28/2023   A-fib (HCC) 10/17/2023   Acute CHF (HCC) 10/17/2023   Myocardial injury 10/17/2023   COPD with acute exacerbation (HCC) 10/17/2023   Acute  respiratory failure with hypoxia (HCC) 10/17/2023   Acute paranoid reaction (HCC) 09/12/2022   Constipation by delayed colonic transit 08/06/2022   Pedal edema 08/06/2022   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) 05/12/2022   HLD (hyperlipidemia) 04/30/2022   Hypothyroidism 04/30/2022   Other symptoms and signs involving cognitive functions and awareness 03/03/2022   History of falling 02/01/2022   Muscle weakness (generalized) 02/01/2022   Need for assistance with personal care 02/01/2022   Other abnormalities of gait and mobility 02/01/2022   Other specified fracture of left pubis, subsequent encounter for fracture with routine healing 02/01/2022   Personal history of other malignant neoplasm of skin 02/01/2022   Protein-calorie malnutrition, severe 01/29/2022   Closed fracture of superior ramus of left pubis (HCC) 01/27/2022   Leukocytosis 01/27/2022   Accidental fall 01/27/2022   COPD (chronic obstructive pulmonary disease) (HCC) 01/27/2022   MDD (major depressive disorder), recurrent episode, moderate (HCC) 07/31/2021   Insomnia 07/31/2021   History of delirium 03/05/2021   High risk medication use 03/05/2021   Unspecified macular degeneration 10/16/2020   Severe major depression, single episode, with psychotic features (HCC)  10/12/2020   UTI (urinary tract infection) 10/04/2020   HTN (hypertension)    Hyponatremia    Underweight 08/08/2020   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 08/04/2018   Cystocele, midline 05/05/2017   Nocturia 10/07/2016   Unstable bladder 10/07/2016   Vaginal atrophy 01/23/2016   Procidentia of uterus 11/01/2015   Midline cystocele 11/01/2015   PCP:  [name], MD Pharmacy:   [facility] [id number] [place], [place] - [address] [place] [address] Phone: [phone] Fax: [phone]  [facility] -  [place], [place] - [address] [place] [address] [place] [address] Phone: [phone] Fax: [phone]  [facility] [address] [place] [address] Phone: [phone] Fax: [phone]     Social Drivers of Health (SDOH) Social History: SDOH Screenings   Food Insecurity: No Food Insecurity (10/28/2023)  Housing: Low Risk  (10/28/2023)  Transportation Needs: No Transportation Needs (10/28/2023)  Utilities: Not At Risk (10/28/2023)  Depression (PHQ2-9): Low Risk  (01/14/2023)  Social Connections: Socially Isolated (10/28/2023)  Tobacco Use: Medium Risk (10/28/2023)   SDOH Interventions:     Readmission Risk Interventions     No data to display

## 2023-10-30 NOTE — Progress Notes (Signed)
 PROGRESS NOTE    Angela Mason  ZOX:[id number] DOB: 03-21-1933 DOA: 10/28/2023 PCP: [name], MD    Brief Narrative:   89 y.o. female with medical history significant of HTN, HLD, PAF on Eliquis, COPD, hypothyroidism, sent from nursing home for duration of worsening of shortness of breath.   Symptoms started yesterday, patient started to have dry cough wheezing and shortness of breath, denied any chest pain no fever or chills.  At baseline patient is fragile, she complains about occasional shortness of breath associated with walking, but denied any chest pains.  She was never diagnosed with COPD, no formal lung function test in the past.   ED Course: Tachycardia and telemonitoring showed A-fib, blood pressure was high and patient was started on nitroglycerin drip.  EKG showed ST elevation on lead V3 and 4, troponin 81, code STEMI was called.  Repeat EKG showed resolution of ST changes. Blood gas 7.2 2/99/48, patient was started on BiPAP.  Blood work showed hemoglobin 13, WBC 13, creatinine 1.1, K4.3.   Cardiology evaluated the patient and consulted STEMI code patient was started on heparin drip.  Patient was started on amiodarone drip, briefly patient heart rate come back to normal sinus however soon went back to rapid A-fib again.  4/3: Patient weaned to nasal cannula.  Seems to be saturating well.  Heart rate improved on amiodarone gtt.  4/4: Patient back in atrial fibrillation.  Metoprolol restarting today  Assessment & Plan:   Principal Problem:   Afib (HCC) Active Problems:   A-fib (HCC)   COPD with acute exacerbation (HCC)   Acute hypoxic respiratory failure (HCC)   Acute hypoxic and hypercapnic respiratory failure Acute decompensated respiratory acidosis COPD exacerbation Initially required NIPPV.  Did not require intubation.  Respiratory status is improving.  Weaned to nasal cannula. Plan: Oxygen, wean as tolerated P.o. prednisone 40 mg daily x 5 days, day  1/5 Nebulizer regimen Incentive spirometry and flutter valve Goal oxygen saturation 88-92%   A-fib with RVR Back in rapid atrial fibrillation/4 AM Plan: Oral amiodarone Restart metoprolol Reduced Eliquis Continue cardiac monitoring  Elevated troponins -Troponin uptrending 81 > 148, no chest pains, code STEMI canceled.  ST-T changes on V3 and V4 was considered to related to tachycardia.  No significant delta and troponins.  Suspect supply/demand ischemia in the setting of decompensated COPD and rapid atrial fibrillation Plan: No plans for evaluation during admission  Acute on chronic HFpEF decompensated -Secondary to A-fib with RVR, management as above -Lasix 20 mg p.o. daily starting 4/4   Moderate protein calorie malnutrition -BMI 18 -Continue protein supplement   DVT prophylaxis: Eliquis Code Status: Full Family Communication: None today Disposition Plan: Status is: Inpatient Remains inpatient appropriate because: Multiple acute issues as above   Level of care: Telemetry Cardiac  Consultants:  Cardiology-[facility]  Procedures:  None  Antimicrobials: None   Subjective: Seen and examined.  Resting in bed.  Relatively comfortable.  No distress.  Objective: Vitals:   10/30/23 0051 10/30/23 0521 10/30/23 0731 10/30/23 0827  BP: 125/65 130/85  118/60  Pulse: 74 75  (!) 102  Resp:  20  19  Temp: 97.8 F (36.6 C) 97.7 F (36.5 C)  97.8 F (36.6 C)  TempSrc: Oral   Oral  SpO2: 95% 100% 98% 99%  Weight:      Height:        Intake/Output Summary (Last 24 hours) at 10/30/2023 1115 Last data filed at 10/30/2023 1056 Gross per 24 hour  Intake 0  ml  Output 300 ml  Net -300 ml   Filed Weights   10/30/23 0041  Weight: 43.5 kg    Examination:  General exam: No acute distress Respiratory system: Poor inspiratory effort.  Crackles at bases.  Normal work of breathing.  2 L Cardiovascular system: S1-2, RRR, no murmurs, pedal edema Gastrointestinal  system: Soft, NT/ND, normal bowel sounds Central nervous system: Alert.  Oriented x 2.  No focal deficits Extremities: Symmetric 5 x 5 power. Skin: No rashes, lesions or ulcers Psychiatry: Judgement and insight appear normal. Mood & affect flattened.     Data Reviewed: I have personally reviewed following labs and imaging studies  CBC: Recent Labs  Lab 10/28/23 0500 10/29/23 0321  WBC 13.3* 9.9  NEUTROABS 10.0*  --   HGB 13.0 9.5*  HCT 42.6 29.7*  MCV 105.7* 101.4*  PLT 298 187   Basic Metabolic Panel: Recent Labs  Lab 10/28/23 0500 10/29/23 0321 10/30/23 0429  NA 140 139 135  K 4.3 3.7 3.1*  CL 98 97* 92*  CO2 32 35* 35*  GLUCOSE 217* 151* 129*  BUN 26* 34* 36*  CREATININE 1.10* 1.02* 0.93  CALCIUM 8.9 8.1* 8.2*   GFR: Estimated Creatinine Clearance: 28.2 mL/min (by C-G formula based on SCr of 0.93 mg/dL). Liver Function Tests: No results for input(s): "AST", "ALT", "ALKPHOS", "BILITOT", "PROT", "ALBUMIN" in the last 168 hours. No results for input(s): "LIPASE", "AMYLASE" in the last 168 hours. No results for input(s): "AMMONIA" in the last 168 hours. Coagulation Profile: Recent Labs  Lab 10/28/23 0500  INR 1.2   Cardiac Enzymes: No results for input(s): "CKTOTAL", "CKMB", "CKMBINDEX", "TROPONINI" in the last 168 hours. BNP (last 3 results) No results for input(s): "PROBNP" in the last 8760 hours. HbA1C: No results for input(s): "HGBA1C" in the last 72 hours. CBG: No results for input(s): "GLUCAP" in the last 168 hours. Lipid Profile: No results for input(s): "CHOL", "HDL", "LDLCALC", "TRIG", "CHOLHDL", "LDLDIRECT" in the last 72 hours. Thyroid Function Tests: No results for input(s): "TSH", "T4TOTAL", "FREET4", "T3FREE", "THYROIDAB" in the last 72 hours. Anemia Panel: No results for input(s): "VITAMINB12", "FOLATE", "FERRITIN", "TIBC", "IRON", "RETICCTPCT" in the last 72 hours. Sepsis Labs: Recent Labs  Lab 10/28/23 0500  PROCALCITON <0.10     Recent Results (from the past 240 hours)  Resp panel by RT-PCR (RSV, Flu A&B, Covid) Anterior Nasal Swab     Status: None   Collection Time: 10/28/23  4:57 AM   Specimen: Anterior Nasal Swab  Result Value Ref Range Status   SARS Coronavirus 2 by RT PCR NEGATIVE NEGATIVE Final    Comment: (NOTE) SARS-CoV-2 target nucleic acids are NOT DETECTED.  The SARS-CoV-2 RNA is generally detectable in upper respiratory specimens during the acute phase of infection. The lowest concentration of SARS-CoV-2 viral copies this assay can detect is 138 copies/mL. A negative result does not preclude SARS-Cov-2 infection and should not be used as the sole basis for treatment or other patient management decisions. A negative result may occur with  improper specimen collection/handling, submission of specimen other than nasopharyngeal swab, presence of viral mutation(s) within the areas targeted by this assay, and inadequate number of viral copies(<138 copies/mL). A negative result must be combined with clinical observations, patient history, and epidemiological information. The expected result is Negative.  Fact Sheet for Patients:  BloggerCourse.com  Fact Sheet for Healthcare Providers:  SeriousBroker.it  This test is no t yet approved or cleared by the [place] FDA and  has  been authorized for detection and/or diagnosis of SARS-CoV-2 by FDA under an Emergency Use Authorization (EUA). This EUA will remain  in effect (meaning this test can be used) for the duration of the COVID-19 declaration under Section 564(b)(1) of the Act, 21 U.S.C.section 360bbb-3(b)(1), unless the authorization is terminated  or revoked sooner.       Influenza A by PCR NEGATIVE NEGATIVE Final   Influenza B by PCR NEGATIVE NEGATIVE Final    Comment: (NOTE) The Xpert Xpress SARS-CoV-2/FLU/RSV plus assay is intended as an aid in the diagnosis of influenza from  Nasopharyngeal swab specimens and should not be used as a sole basis for treatment. Nasal washings and aspirates are unacceptable for Xpert Xpress SARS-CoV-2/FLU/RSV testing.  Fact Sheet for Patients: BloggerCourse.com  Fact Sheet for Healthcare Providers: SeriousBroker.it  This test is not yet approved or cleared by the [place] FDA and has been authorized for detection and/or diagnosis of SARS-CoV-2 by FDA under an Emergency Use Authorization (EUA). This EUA will remain in effect (meaning this test can be used) for the duration of the COVID-19 declaration under Section 564(b)(1) of the Act, 21 U.S.C. section 360bbb-3(b)(1), unless the authorization is terminated or revoked.     Resp Syncytial Virus by PCR NEGATIVE NEGATIVE Final    Comment: (NOTE) Fact Sheet for Patients: BloggerCourse.com  Fact Sheet for Healthcare Providers: SeriousBroker.it  This test is not yet approved or cleared by the [place] FDA and has been authorized for detection and/or diagnosis of SARS-CoV-2 by FDA under an Emergency Use Authorization (EUA). This EUA will remain in effect (meaning this test can be used) for the duration of the COVID-19 declaration under Section 564(b)(1) of the Act, 21 U.S.C. section 360bbb-3(b)(1), unless the authorization is terminated or revoked.  Performed at [facility], [address] [place], [place] [address]   Blood culture (single)     Status: None (Preliminary result)   Collection Time: 10/28/23  5:00 AM   Specimen: BLOOD  Result Value Ref Range Status   Specimen Description BLOOD LEFT [facility]  Final   Special Requests   Final    BOTTLES DRAWN AEROBIC AND ANAEROBIC Blood Culture adequate volume   Culture   Final    NO GROWTH 2 DAYS Performed at [facility], [address] [place], [place] [address]    Report Status PENDING   Incomplete  Blood culture (single)     Status: None (Preliminary result)   Collection Time: 10/29/23  9:51 PM   Specimen: BLOOD  Result Value Ref Range Status   Specimen Description BLOOD LEFT FOREARM  Final   Special Requests   Final    BOTTLES DRAWN AEROBIC AND ANAEROBIC Blood Culture adequate volume   Culture   Final    NO GROWTH < 12 HOURS Performed at [facility], [address] [place], [place] [address]    Report Status PENDING  Incomplete  MRSA Next Gen by PCR, Nasal     Status: None   Collection Time: 10/30/23  1:34 AM   Specimen: Nasal Mucosa; Nasal Swab  Result Value Ref Range Status   MRSA by PCR Next Gen NOT DETECTED NOT DETECTED Final    Comment: (NOTE) The GeneXpert MRSA Assay (FDA approved for NASAL specimens only), is one component of a comprehensive MRSA colonization surveillance program. It is not intended to diagnose MRSA infection nor to guide or monitor treatment for MRSA infections. Test performance is not FDA approved in patients less than 2 years  old. Performed at [facility], [address] [place], [place] [address]          Radiology Studies: No results found.       Scheduled Meds:  amiodarone  400 mg Oral BID   Followed by   Melene Muller ON 11/05/2023] amiodarone  200 mg Oral Daily   apixaban  2.5 mg Oral BID   ARIPiprazole  2 mg Oral Q breakfast   aspirin EC  81 mg Oral Daily   budesonide (PULMICORT) nebulizer solution  2 mg Nebulization Q12H   dapagliflozin propanediol  10 mg Oral Daily   erythromycin  1 Application Both Eyes QHS   furosemide  20 mg Oral Daily   ipratropium  0.5 mg Nebulization Q6H   metoprolol succinate  12.5 mg Oral BID   mometasone-formoterol  2 puff Inhalation BID   nystatin  1 Application Topical BID   potassium chloride  40 mEq Oral Q2H   pravastatin  20 mg Oral q1800   predniSONE  40 mg Oral Q breakfast   Continuous Infusions:     LOS: 2 days     Tresa Moore, MD [facility]   If 7PM-7AM, please contact night-coverage  10/30/2023, 11:15 AM

## 2023-10-30 NOTE — Progress Notes (Signed)
 [facility] CARDIOLOGY PROGRESS NOTE       [name] MRN: [id number] DOB/AGE: 1933/06/07 89 y.o.  Admit date: 10/28/2023 Referring Physician Dr. Herma Mering Primary Physician [name], MD  Primary Cardiologist None Reason for Consultation AF RVR, AoCHF  HPI: Angela Mason is a 89 y.o. female  with a past medical history of paroxysmal atrial fibrillation diagnosed last month, hypertension, hyperlipidemia, COPD, hypothyroidism who presented to the ED on 10/28/2023 for shortness of breath. Cardiology was consulted for further evaluation.   Interval history: -Patient seen and examined this morning, resting comfortably in bed with daughter present at bedside. -Reports she is feeling okay overall today -Remains on supplemental oxygen but denies any shortness of breath. -Back in atrial fibrillation with mildly elevated rates this a.m. -Renal function stable with diuresis, appears euvolemic.  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) (HCC)    HLD (hyperlipidemia)    HTN (hypertension)    Hypothyroidism    Macular degeneration, bilateral    Paroxysmal atrial fibrillation (HCC)    Varicosities     Past Surgical History:  Procedure Laterality Date   CATARACT EXTRACTION W/PHACO Right 02/03/2017   Procedure: CATARACT EXTRACTION PHACO AND INTRAOCULAR LENS PLACEMENT (IOC);  Surgeon: Galen Manila, MD;  Location: [facility] ORS;  Service: Ophthalmology;  Laterality: Right;  [place] 00:57.2 AP% 18.5 CDE 10.58 Fluid pack lot # [id number]   CATARACT EXTRACTION W/PHACO Left 02/24/2017   Procedure: CATARACT EXTRACTION PHACO AND INTRAOCULAR LENS PLACEMENT (IOC);  Surgeon: Galen Manila, MD;  Location: [facility] ORS;  Service: Ophthalmology;  Laterality: Left;  [place] 01:12 AP%  25.0 CDE 18.03 Fluid pack lot # [id number]   MOHS SURGERY  2015   nose   SALIVARY GLAND SURGERY Left 2012   TONSILLECTOMY  1940    Medications Prior to Admission  Medication Sig Dispense Refill Last Dose/Taking   acetaminophen (TYLENOL) 325 MG tablet Take 650 mg by mouth every 4 (four) hours as needed.   Taking As Needed   albuterol (VENTOLIN HFA) 108 (90 Base) MCG/ACT inhaler Inhale 2 puffs into the lungs every 4 (four) hours as needed for wheezing or shortness of breath.   Taking As Needed   amiodarone (PACERONE) 200 MG tablet Take 1 tablet (200 mg total) by mouth 2 (two) times daily for 14 days, THEN 1 tablet (200 mg total) daily.   10/27/2023   apixaban (ELIQUIS) 2.5 MG TABS tablet Take 1 tablet (2.5 mg total) by mouth 2 (two) times daily.   10/27/2023   ARIPiprazole (ABILIFY) 2 MG tablet Take 1 tablet (2 mg total) by mouth daily with breakfast. 45 tablet 0 10/27/2023   dapagliflozin propanediol (FARXIGA) 10 MG TABS tablet Take 1 tablet (10 mg total) by mouth daily.   10/27/2023   erythromycin ophthalmic ointment Place 1 Application into both eyes at bedtime.   Past Week   estradiol (ESTRACE) 0.1 MG/GM vaginal cream Place 1 Applicatorful vaginally 2 (two) times a week. 42.5 g 1 Past Week   Multiple Vitamins-Minerals (PRESERVISION AREDS) TABS Take 1 capsule by mouth 2 (two) times daily.   10/27/2023   nystatin (MYCOSTATIN/NYSTOP) powder Apply 1 Application topically 2 (two) times daily.   10/27/2023   Omega-3 Fatty Acids (FISH OIL CONCENTRATE  PO) Take 1 tablet by mouth daily.    10/27/2023   Polyvinyl Alcohol-Povidone PF (REFRESH) 1.4-0.6 % SOLN Place 1 drop into both eyes in the morning and at bedtime.   10/27/2023   pravastatin (PRAVACHOL) 20 MG tablet Take 1 tablet (20 mg total) by mouth daily at 6 PM.   10/26/2023   OXYGEN 2 lpm for dyspnea or SOB      Social History   Socioeconomic History   Marital status: Widowed    Spouse name: Not on file   Number of children: Not on file   Years of  education: Not on file   Highest education level: Not on file  Occupational History   Not on file  Tobacco Use   Smoking status: Former    Current packs/day: 0.00    Types: Cigarettes    Quit date: 09/05/2005    Years since quitting: 18.1   Smokeless tobacco: Never  Vaping Use   Vaping status: Never Used  Substance and Sexual Activity   Alcohol use: Not Currently   Drug use: No   Sexual activity: Not Currently    Birth control/protection: Post-menopausal  Other Topics Concern   Not on file  Social History Narrative   Not on file   Social Drivers of Health   Financial Resource Strain: Not on file  Food Insecurity: No Food Insecurity (10/28/2023)   Hunger Vital Sign    Worried About Running Out of Food in the Last Year: Never true    Ran Out of Food in the Last Year: Never true  Transportation Needs: No Transportation Needs (10/28/2023)   PRAPARE - Administrator, Civil Service (Medical): No    Lack of Transportation (Non-Medical): No  Physical Activity: Not on file  Stress: Not on file  Social Connections: Socially Isolated (10/28/2023)   Social Connection and Isolation Panel [NHANES]    Frequency of Communication with Friends and Family: More than three times a week    Frequency of Social Gatherings with Friends and Family: Twice a week    Attends Religious Services: Never    Database administrator or Organizations: No    Attends Banker Meetings: Never    Marital Status: Widowed  Intimate Partner Violence: Not At Risk (10/28/2023)   Humiliation, Afraid, Rape, and Kick questionnaire    Fear of Current or Ex-Partner: No    Emotionally Abused: No    Physically Abused: No    Sexually Abused: No    Family History  Problem Relation Age of Onset   Diverticulitis Mother    Macular degeneration Mother    COPD Father    Heart failure Father    Epilepsy Sister    Parkinson's disease Brother    Cancer Neg Hx    Diabetes Neg Hx    Breast cancer Neg Hx       Vitals:   10/30/23 0051 10/30/23 0521 10/30/23 0731 10/30/23 0827  BP: 125/65 130/85  118/60  Pulse: 74 75  (!) 102  Resp:  20  19  Temp: 97.8 F (36.6 C) 97.7 F (36.5 C)  97.8 F (36.6 C)  TempSrc: Oral   Oral  SpO2: 95% 100% 98% 99%  Weight:      Height:        PHYSICAL EXAM General: Ill-appearing elderly female, well nourished, in no acute distress. HEENT: Normocephalic and atraumatic. Neck: No JVD.  Lungs: On 2L nasal cannula.  Clear bilaterally to auscultation.  Heart: Irregularly  irregular, controlled rate. Normal S1 and S2 without gallops or murmurs.  Abdomen: Non-distended appearing.  Msk: Normal strength and tone for age. Extremities: Warm and well perfused. No clubbing, cyanosis.  No edema.  Neuro: Alert and oriented X 3. Psych: Answers questions appropriately.   Labs: Basic Metabolic Panel: Recent Labs    10/29/23 0321 10/30/23 0429  NA 139 135  K 3.7 3.1*  CL 97* 92*  CO2 35* 35*  GLUCOSE 151* 129*  BUN 34* 36*  CREATININE 1.02* 0.93  CALCIUM 8.1* 8.2*   Liver Function Tests: No results for input(s): "AST", "ALT", "ALKPHOS", "BILITOT", "PROT", "ALBUMIN" in the last 72 hours. No results for input(s): "LIPASE", "AMYLASE" in the last 72 hours. CBC: Recent Labs    10/28/23 0500 10/29/23 0321  WBC 13.3* 9.9  NEUTROABS 10.0*  --   HGB 13.0 9.5*  HCT 42.6 29.7*  MCV 105.7* 101.4*  PLT 298 187   Cardiac Enzymes: Recent Labs    10/28/23 0500 10/28/23 0743 10/28/23 1418  TROPONINIHS 81* 148* 150*   BNP: Recent Labs    10/28/23 0500  BNP 1,228.0*   D-Dimer: No results for input(s): "DDIMER" in the last 72 hours. Hemoglobin A1C: No results for input(s): "HGBA1C" in the last 72 hours. Fasting Lipid Panel: No results for input(s): "CHOL", "HDL", "LDLCALC", "TRIG", "CHOLHDL", "LDLDIRECT" in the last 72 hours. Thyroid Function Tests: No results for input(s): "TSH", "T4TOTAL", "T3FREE", "THYROIDAB" in the last 72 hours.  Invalid  input(s): "FREET3" Anemia Panel: No results for input(s): "VITAMINB12", "FOLATE", "FERRITIN", "TIBC", "IRON", "RETICCTPCT" in the last 72 hours.   Radiology: ECHOCARDIOGRAM COMPLETE Result Date: 10/28/2023    ECHOCARDIOGRAM REPORT   Patient Name:   Angela Mason Date of Exam: 10/28/2023 Medical Rec #:  [id number]       Height:       60.0 in Accession #:    [id number]      Weight:       99.8 lb Date of Birth:  12/31/1932       BSA:          1.389 m Patient Age:    89 years        BP:           96/64 mmHg Patient Gender: F               HR:           82 bpm. Exam Location:  [facility] Procedure: 2D Echo, Cardiac Doppler and Color Doppler (Both Spectral and Color            Flow Doppler were utilized during procedure). Indications:     Abnormal ECG R94.31  History:         Patient has prior history of Echocardiogram examinations, most                  recent 10/18/2023. COPD; Risk Factors:Hypertension. Paroxysmal                  Afib.  Sonographer:     Cristela Blue Referring Phys:  [id number] CHRISTOPHER END Diagnosing Phys: Marcina Millard MD IMPRESSIONS  1. Left ventricular ejection fraction, by estimation, is 35 to 40%. The left ventricle has moderately decreased function. The left ventricle demonstrates regional wall motion abnormalities (see scoring diagram/findings for description). Left ventricular  diastolic parameters are indeterminate.  2. Right ventricular systolic function is normal. The right ventricular size is normal.  3. Left atrial size was mildly dilated.  4. Right atrial size was moderately dilated.  5. The mitral valve is normal in structure. No evidence of mitral valve regurgitation. No evidence of mitral stenosis.  6. The aortic valve is normal in structure. Aortic valve regurgitation is not visualized. Mild aortic valve stenosis.  7. The inferior vena cava is normal in size with greater than 50% respiratory variability, suggesting right atrial pressure of 3 mmHg. FINDINGS  Left Ventricle: Left  ventricular ejection fraction, by estimation, is 35 to 40%. The left ventricle has moderately decreased function. The left ventricle demonstrates regional wall motion abnormalities. Strain was performed and the global longitudinal strain is indeterminate. The left ventricular internal cavity size was normal in size. There is no left ventricular hypertrophy. Left ventricular diastolic parameters are indeterminate.  LV Wall Scoring: The mid inferoseptal segment, apical septal segment, and apex are hypokinetic. Right Ventricle: The right ventricular size is normal. No increase in right ventricular wall thickness. Right ventricular systolic function is normal. Left Atrium: Left atrial size was mildly dilated. Right Atrium: Right atrial size was moderately dilated. Pericardium: There is no evidence of pericardial effusion. Mitral Valve: The mitral valve is normal in structure. No evidence of mitral valve regurgitation. No evidence of mitral valve stenosis. Tricuspid Valve: The tricuspid valve is normal in structure. Tricuspid valve regurgitation is not demonstrated. No evidence of tricuspid stenosis. Aortic Valve: The aortic valve is normal in structure. Aortic valve regurgitation is not visualized. Mild aortic stenosis is present. Aortic valve mean gradient measures 7.0 mmHg. Aortic valve peak gradient measures 10.9 mmHg. Aortic valve area, by VTI measures 1.24 cm. Pulmonic Valve: The pulmonic valve was normal in structure. Pulmonic valve regurgitation is not visualized. No evidence of pulmonic stenosis. Aorta: The aortic root is normal in size and structure. Venous: The inferior vena cava is normal in size with greater than 50% respiratory variability, suggesting right atrial pressure of 3 mmHg. IAS/Shunts: No atrial level shunt detected by color flow Doppler. Additional Comments: 3D was performed not requiring image post processing on an independent workstation and was indeterminate.  LEFT VENTRICLE PLAX 2D LVIDd:          3.30 cm LVIDs:         2.70 cm LV PW:         1.10 cm LV IVS:        1.50 cm LVOT diam:     2.00 cm LV SV:         31 LV SV Index:   22 LVOT Area:     3.14 cm  RIGHT VENTRICLE RV Basal diam:  3.50 cm RV Mid diam:    2.90 cm RV S prime:     12.40 cm/s TAPSE (M-mode): 1.2 cm LEFT ATRIUM             Index        RIGHT ATRIUM           Index LA diam:        2.80 cm 2.02 cm/m   RA Area:     15.70 cm LA Vol (A2C):   22.5 ml 16.20 ml/m  RA Volume:   41.30 ml  29.73 ml/m LA Vol (A4C):   12.5 ml 9.00 ml/m LA Biplane Vol: 17.8 ml 12.81 ml/m  AORTIC VALVE AV Area (Vmax):    1.17 cm AV Area (Vmean):   1.13 cm AV Area (VTI):     1.24 cm AV Vmax:           165.33  cm/s AV Vmean:          119.667 cm/s AV VTI:            0.251 m AV Peak Grad:      10.9 mmHg AV Mean Grad:      7.0 mmHg LVOT Vmax:         61.50 cm/s LVOT Vmean:        43.200 cm/s LVOT VTI:          0.099 m LVOT/AV VTI ratio: 0.39  AORTA Ao Root diam: 2.80 cm MITRAL VALVE               TRICUSPID VALVE MV Area (PHT): 6.96 cm    TR Peak grad:   21.3 mmHg MV Decel Time: 109 msec    TR Vmax:        231.00 cm/s MV E velocity: 87.50 cm/s                            SHUNTS                            Systemic VTI:  0.10 m                            Systemic Diam: 2.00 cm Marcina Millard MD Electronically signed by Marcina Millard MD Signature Date/Time: 10/28/2023/1:43:00 PM    Final    DG Chest Portable 1 View Result Date: 10/28/2023 CLINICAL DATA:  Shortness of breath. EXAM: PORTABLE CHEST 1 VIEW COMPARISON:  10/17/2023 FINDINGS: Interstitial markings are diffusely coarsened with chronic features. Interval development of interstitial and alveolar opacity in the right upper lung. Small bilateral pleural effusions suspected. The cardiopericardial silhouette is within normal limits for size. Bones are diffusely demineralized. Telemetry leads overlie the chest. IMPRESSION: 1. Interval development of interstitial and alveolar opacity in the right upper  lung, suspicious for pneumonia although asymmetric edema is also a consideration. 2. Small bilateral pleural effusions. Electronically Signed   By: Kennith Center M.D.   On: 10/28/2023 05:12   ECHOCARDIOGRAM COMPLETE Result Date: 10/18/2023    ECHOCARDIOGRAM REPORT   Patient Name:   COLEY LITTLES Date of Exam: 10/18/2023 Medical Rec #:  [id number]       Height:       60.0 in Accession #:    [id number]      Weight:       95.0 lb Date of Birth:  1933/04/05       BSA:          1.360 m Patient Age:    89 years        BP:           118/83 mmHg Patient Gender: F               HR:           116 bpm. Exam Location:  [facility] Procedure: 2D Echo, Cardiac Doppler and Color Doppler (Both Spectral and Color            Flow Doppler were utilized during procedure). Indications:     CHF- Acute Diastolic I50.31  History:         Patient has no prior history of Echocardiogram examinations.  Sonographer:     Elwin Sleight RDCS Referring Phys:  Wynona Neat NIU Diagnosing Phys: Adrian Blackwater  Sonographer Comments:  Image acquisition challenging due to respiratory motion. IMPRESSIONS  1. Left ventricular ejection fraction, by estimation, is 45 to 50%. The left ventricle has mildly decreased function. The left ventricle demonstrates global hypokinesis. The left ventricular internal cavity size was moderately dilated. There is mild concentric left ventricular hypertrophy. Left ventricular diastolic parameters are indeterminate.  2. Right ventricular systolic function is moderately reduced. The right ventricular size is moderately enlarged. Mildly increased right ventricular wall thickness.  3. Left atrial size was severely dilated.  4. Right atrial size was severely dilated.  5. The mitral valve is grossly normal. Moderate mitral valve regurgitation.  6. Tricuspid valve regurgitation is mild to moderate.  7. The aortic valve is calcified. Aortic valve regurgitation is not visualized. Aortic valve sclerosis/calcification is present, without any  evidence of aortic stenosis. FINDINGS  Left Ventricle: Left ventricular ejection fraction, by estimation, is 45 to 50%. The left ventricle has mildly decreased function. The left ventricle demonstrates global hypokinesis. Strain was performed and the global longitudinal strain is indeterminate. The left ventricular internal cavity size was moderately dilated. There is mild concentric left ventricular hypertrophy. Left ventricular diastolic parameters are indeterminate. Right Ventricle: The right ventricular size is moderately enlarged. Mildly increased right ventricular wall thickness. Right ventricular systolic function is moderately reduced. Left Atrium: Left atrial size was severely dilated. Right Atrium: Right atrial size was severely dilated. Pericardium: There is no evidence of pericardial effusion. Mitral Valve: The mitral valve is grossly normal. Moderate mitral valve regurgitation. Tricuspid Valve: The tricuspid valve is grossly normal. Tricuspid valve regurgitation is mild to moderate. Aortic Valve: The aortic valve is calcified. Aortic valve regurgitation is not visualized. Aortic valve sclerosis/calcification is present, without any evidence of aortic stenosis. Aortic valve peak gradient measures 10.6 mmHg. Pulmonic Valve: The pulmonic valve was grossly normal. Pulmonic valve regurgitation is mild. Aorta: The aortic root, ascending aorta and aortic arch are all structurally normal, with no evidence of dilitation or obstruction. IAS/Shunts: No atrial level shunt detected by color flow Doppler. Additional Comments: 3D was performed not requiring image post processing on an independent workstation and was indeterminate.  LEFT VENTRICLE PLAX 2D LVIDd:         3.20 cm   Diastology LVIDs:         2.40 cm   LV e' lateral: 12.40 cm/s LV PW:         1.10 cm LV IVS:        1.00 cm LVOT diam:     1.90 cm LV SV:         29 LV SV Index:   22 LVOT Area:     2.84 cm  RIGHT VENTRICLE RV Basal diam:  2.80 cm LEFT ATRIUM              Index        RIGHT ATRIUM           Index LA diam:        3.50 cm 2.57 cm/m   RA Area:     17.00 cm LA Vol (A2C):   32.7 ml 24.04 ml/m  RA Volume:   48.00 ml  35.29 ml/m LA Vol (A4C):   39.2 ml 28.82 ml/m LA Biplane Vol: 39.8 ml 29.26 ml/m  AORTIC VALVE                 PULMONIC VALVE AV Area (Vmax): 1.29 cm     PV Vmax:        0.64 m/s AV  Vmax:        162.50 cm/s  PV Peak grad:   1.6 mmHg AV Peak Grad:   10.6 mmHg    RVOT Peak grad: 1 mmHg LVOT Vmax:      73.90 cm/s LVOT Vmean:     50.100 cm/s LVOT VTI:       0.104 m  AORTA Ao Root diam: 2.80 cm TRICUSPID VALVE TR Peak grad:   33.6 mmHg TR Vmax:        290.00 cm/s  SHUNTS Systemic VTI:  0.10 m Systemic Diam: 1.90 cm Adrian Blackwater Electronically signed by Adrian Blackwater Signature Date/Time: 10/18/2023/12:36:21 PM    Final    CT Angio Chest PE W and/or Wo Contrast Result Date: 10/17/2023 CLINICAL DATA:  Shortness of breath.  History of COPD.  Ex-smoker. EXAM: CT ANGIOGRAPHY CHEST WITH CONTRAST TECHNIQUE: Multidetector CT imaging of the chest was performed using the standard protocol during bolus administration of intravenous contrast. Multiplanar CT image reconstructions and MIPs were obtained to evaluate the vascular anatomy. RADIATION DOSE REDUCTION: This exam was performed according to the departmental dose-optimization program which includes automated exposure control, adjustment of the mA and/or kV according to patient size and/or use of iterative reconstruction technique. CONTRAST:  60mL OMNIPAQUE IOHEXOL 350 MG/ML SOLN COMPARISON:  Plain film of earlier today.  No prior CT. FINDINGS: Cardiovascular: The quality of this exam for evaluation of pulmonary embolism is good. Although the bolus is well timed, limitations include minimal motion, EKG wire artifact and patient arm position, not raised above the head. No evidence of pulmonary embolism. Advanced aortic and branch vessel atherosclerosis. The aorta is not well opacified secondary to bolus  timing. Moderate cardiomegaly, without pericardial effusion. Left main and 3 vessel coronary artery calcification. Mediastinum/Nodes: No mediastinal or hilar adenopathy. Lungs/Pleura: Small bilateral pleural effusions. Moderate centrilobular emphysema. Right middle lobe endobronchial compression/narrowing at the level of the proximal segmental bronchi including on 68/4. This results in right middle lobe collapse. No well-defined obstructive mass. Biapical pleuroparenchymal scarring. Left greater than right base dependent compressive atelectasis. Upper Abdomen: Reflux of contrast into the hepatic veins suggests elevated right heart pressures. Artifact degradation continuing into the upper abdomen. No gross acute finding. Musculoskeletal: Lower thoracic spondylosis with S-shaped thoracolumbar spine curvature. Review of the MIP images confirms the above findings. IMPRESSION: 1.  No evidence of pulmonary embolism. 2. Mild multifocal degradation as detailed above. 3. Small bilateral pleural effusions. Findings of elevated right heart pressures. Suspect a component of fluid overload. 4. Right middle lobe endobronchial obstruction or compression at the level of the proximal subsegmental bronchi resulting in right middle lobe collapse. No well-defined obstructive mass. Consider further evaluation with bronchoscopy with attention to the right middle lobe bronchus. Alternatively, especially given patient age and comorbidities, CT follow-up at 6 months could be performed. 5. Aortic atherosclerosis (ICD10-I70.0), coronary artery atherosclerosis and emphysema (ICD10-J43.9). Electronically Signed   By: Jeronimo Greaves M.D.   On: 10/17/2023 18:40   DG Chest 2 View Result Date: 10/17/2023 CLINICAL DATA:  Shortness of breath EXAM: CHEST - 2 VIEW COMPARISON:  Chest radiograph dated 06/26/2022 FINDINGS: Normal lung volumes. No focal consolidations. Trace blunting of bilateral costophrenic angles. No pneumothorax. Similar mildly  enlarged cardiomediastinal silhouette. No acute osseous abnormality. Unchanged compression deformity of T4. IMPRESSION: 1. Small bilateral pleural effusions. 2. Similar mild cardiomegaly. Electronically Signed   By: Agustin Cree M.D.   On: 10/17/2023 15:23    ECHO as above  TELEMETRY reviewed by me 10/30/2023: Sinus rhythm  rate 70s > atrial fibrillation rate 90-100s  EKG reviewed by me: Initial EKG in the ED sinus tach, repeat this a.m. atrial fibrillation rate 121 bpm  Data reviewed by me 10/30/2023: last 24h vitals tele labs imaging I/O hospitalist progress note  Principal Problem:   Afib (HCC) Active Problems:   A-fib (HCC)   COPD with acute exacerbation (HCC)   Acute hypoxic respiratory failure (HCC)    ASSESSMENT AND PLAN:  Olita Takeshita is a 89 y.o. female  with a past medical history of paroxysmal atrial fibrillation diagnosed last month, hypertension, hyperlipidemia, COPD, hypothyroidism who presented to the ED on 10/28/2023 for shortness of breath. Cardiology was consulted for further evaluation.   # Atrial fibrillation RVR # Recent onset atrial fibrillation # Acute on chronic HFmrEF # Acute hypoxic respiratory failure Patient with recent hospitalization for A-fib RVR and acute heart failure presenting back with shortness of breath and increased work of breathing.  Suspect heart failure exacerbation with BNP elevated at 1200.  Also in A-fib RVR on telemetry in the ED. -Transition to p.o. amiodarone load today. -Continue reduced dose Eliquis 2.5 mg twice daily given age and weight. -Start Lasix 20 mg p.o. today. -Continue Farxiga 10 mg daily.  Plan to resume metoprolol as able. -Continue pravastatin 20 mg daily. -Mildly elevated troponins most consistent with demand/supply mismatch and not ACS in the setting of acute heart failure, acute respiratory failure, A-fib RVR. -Palliative consulted by primary service, appreciate input.   This patient's plan of care was discussed and  created with Dr. Darrold Junker and he is in agreement.  Signed: Gale Journey, PA-C  10/30/2023, 9:27 AM [facility]

## 2023-10-30 NOTE — Evaluation (Signed)
 Occupational Therapy Evaluation Patient Details Name: Angela Mason MRN: [id number] DOB: 02-Apr-1933 Today's Date: 10/30/2023   History of Present Illness   Angela Mason is a 89 y.o. female with medical history significant of HTN, HLD, PAF on Eliquis, COPD, hypothyroidism, sent from nursing home for duration of worsening of shortness of breath     Clinical Impressions Chart reviewed, pt greeted in bed, alert and oriented x3, not oriented to situation and increased time required for one step direction following. PTA pt at [facility] but lives at [facility] where she has assist for IADls and supervision for showering. MOD I for dressing, feeding, grooming, amb with rollator. Pt presents with deficits in strength, endurance, activity tolerance, balance, affecting safe and optimal ADL completion. Please refer below for further details. Of note, pt spo2 82% on RA after amb, >90% on 2L via [place] in chair after mobility. HR 110s bpm after mobility. Pt is performing ADL below PLOF, will continue to benefit from skilled OT to address deficits and to facilitate optimal ADL perofmrance.      If plan is discharge home, recommend the following:   A little help with walking and/or transfers;A little help with bathing/dressing/bathroom;Help with stairs or ramp for entrance;Assist for transportation     Functional Status Assessment   Patient has had a recent decline in their functional status and demonstrates the ability to make significant improvements in function in a reasonable and predictable amount of time.     Equipment Recommendations   None recommended by OT     Recommendations for Other Services         Precautions/Restrictions   Precautions Precautions: Fall Recall of Precautions/Restrictions: Intact Restrictions Weight Bearing Restrictions Per Provider Order: No     Mobility Bed Mobility Overal bed mobility: Needs Assistance Bed Mobility: Supine to Sit     Supine to  sit: Contact guard, HOB elevated          Transfers Overall transfer level: Needs assistance Equipment used: Rolling walker (2 wheels) Transfers: Sit to/from Stand Sit to Stand: Min assist, Contact guard assist                  Balance Overall balance assessment: Needs assistance Sitting-balance support: Feet supported Sitting balance-Leahy Scale: Good     Standing balance support: Bilateral upper extremity supported, During functional activity, Reliant on assistive device for balance Standing balance-Leahy Scale: Fair                             ADL either performed or assessed with clinical judgement   ADL Overall ADL's : Needs assistance/impaired     Grooming: Set up;Sitting               Lower Body Dressing: Moderate assistance Lower Body Dressing Details (indicate cue type and reason): anticipate Toilet Transfer: Contact guard assist;Rolling walker (2 wheels) Toilet Transfer Details (indicate cue type and reason): simulated         Functional mobility during ADLs: Minimal assistance;Rolling walker (2 wheels) (approx 70')       Vision Patient Visual Report: No change from baseline       Perception         Praxis         Pertinent Vitals/Pain Pain Assessment Pain Assessment: No/denies pain     Extremity/Trunk Assessment Upper Extremity Assessment Upper Extremity Assessment: Generalized weakness   Lower Extremity Assessment Lower Extremity Assessment: Generalized weakness  Cervical / Trunk Assessment Cervical / Trunk Assessment: Kyphotic   Communication Communication Communication: No apparent difficulties   Cognition Arousal: Alert Behavior During Therapy: WFL for tasks assessed/performed Cognition: No family/caregiver present to determine baseline, Cognition impaired   Orientation impairments: Situation   Memory impairment (select all impairments): Short-term memory Attention impairment (select first level of  impairment): Selective attention                     Following commands: Intact       Cueing  General Comments   Cueing Techniques: Verbal cues  spo2 82% on RA after amb, >90% on 2L after returning to sitting   Exercises Other Exercises Other Exercises: edu er: role of OT, role of rehab, discharge recommendations   Shoulder Instructions      Home Living Family/patient expects to be discharged to:: Skilled nursing facility                             Home Equipment: Rollator (4 wheels)   Additional Comments: [facility]      Prior Functioning/Environment Prior Level of Function : Needs assist             Mobility Comments: pt amb with rollator per her report ADLs Comments: MOD I with dressing, feeding, grooming; supervision for bathing per pt report; assist for IADLs    OT Problem List: Decreased strength;Decreased activity tolerance;Impaired balance (sitting and/or standing)   OT Treatment/Interventions: Self-care/ADL training;Therapeutic exercise;Therapeutic activities;Patient/family education;Energy conservation;DME and/or AE instruction;Balance training      OT Goals(Current goals can be found in the care plan section)   Acute Rehab OT Goals Patient Stated Goal: improve function OT Goal Formulation: With patient Time For Goal Achievement: 11/13/23 Potential to Achieve Goals: Good ADL Goals Pt Will Perform Grooming: with modified independence;sitting Pt Will Perform Lower Body Dressing: with modified independence;sit to/from stand;sitting/lateral leans Pt Will Transfer to Toilet: with modified independence;ambulating Pt Will Perform Toileting - Clothing Manipulation and hygiene: with modified independence;sitting/lateral leans;sit to/from stand   OT Frequency:  Min 2X/week    Co-evaluation PT/OT/SLP Co-Evaluation/Treatment: Yes Reason for Co-Treatment: To address functional/ADL transfers          AM-PAC OT "6 Clicks" Daily  Activity     Outcome Measure Help from another person eating meals?: None Help from another person taking care of personal grooming?: A Little Help from another person toileting, which includes using toliet, bedpan, or urinal?: A Little Help from another person bathing (including washing, rinsing, drying)?: A Little Help from another person to put on and taking off regular upper body clothing?: None Help from another person to put on and taking off regular lower body clothing?: A Little 6 Click Score: 20   End of Session Equipment Utilized During Treatment: Rolling walker (2 wheels);Oxygen  Activity Tolerance: Patient tolerated treatment well Patient left: in chair;with call bell/phone within reach;with chair alarm set  OT Visit Diagnosis: Other abnormalities of gait and mobility (R26.89);Muscle weakness (generalized) (M62.81);Unsteadiness on feet (R26.81)                Time: 1610-9604 OT Time Calculation (min): 19 min Charges:  OT General Charges $OT Visit: 1 Visit OT Evaluation $OT Eval Low Complexity: 1 Low  Oleta Mouse, OTD OTR/L  10/30/23, 1:24 PM

## 2023-10-30 NOTE — Progress Notes (Signed)
 Palliative Care Progress Note, Assessment & Plan   Patient Name: Angela Mason       Date: 10/30/2023 DOB: 22-Sep-1932  Age: 89 y.o. MRN#: [id number] Attending Physician: Angela Moore, MD Primary Care Physician: Angela Mealing, MD Admit Date: 10/28/2023  Subjective: Patient is out of bed and sitting in the recliner with nasal cannula in place.  She is awake, alert, acknowledges my presence, and is able to make her wishes known.  No family or friends present during my visit.  HPI: 89 y.o. female  with past medical history of proximal A-fib (Eliquis), HTN, HLD, COPD, hypothyroidism, CHF (EF of 35 to 40%), bilateral macular degeneration, skin cancer (nose, Mohs procedure 2015), depression, former smoker, and recent hospitalization ([facility] - 3/22-3/26 AECOPD and CHF exacerbation) admitted on 10/28/2023 with Angela Mason.   Patient has been diagnosed with acute exacerbation of congestive heart failure and COPD.  Additionally, patient found to be in A-fib with RVR and elevated troponins.  Patient being treated with BiPAP (now down to nasal cannula), heparin and amiodarone gtt., steroids, and close monitoring.   PMT was consulted to discuss boundaries and goals of care.   Summary of counseling/coordination of care: Extensive chart review completed prior to meeting patient including labs, vital signs, imaging, progress notes, orders, and available advanced directive documents from current and previous encounters.   After reviewing the patient's chart and assessing the patient at bedside, I spoke with patient in regards to symptom management and goals of care.   Symptoms assessed.  Patient endorses she is just returned from a walk.  She shares she did not experience chest pain or shortness of breath.  She says it just  takes a lot more energy than she is used to.  She has no acute complaints at this time.  No adjustment to [facility] needed.  I again attempted to discuss values and goals important to patient.  Boundaries of medical care reviewed.  She shares she is in agreement with what her daughter said yesterday.  Full code and full scope remain.  After visiting with the patient, I spoke with her daughter/HC POA Angela Mason over the phone.  Brief medical update given.  Angela Mason shares appreciation for my follow-up phone call.  She has no questions or concerns at this time.  Full code and full scope remain.  Discussed importance of continued education and understanding of patient's chronic disease processes, symptom management and importance of advance directives and outlining patient's wishes/boundaries of care. Outpatient palliative services offered but politely declined. Angela Mason shares patient has support at [facility].   PMT contact info given should acute palliative needs arise prior to discharge.  PMT will step back from daily visits.  Please reengage with PMT if goals change, at patient/family's request, or if patient's health deteriorates during hospitalization.  Physical Exam Vitals reviewed.  Constitutional:      General: She is not in acute distress.    Appearance: She is normal weight.  HENT:     Head: Normocephalic.     Nose: Nose normal.     Mouth/Throat:     Mouth: Mucous membranes are moist.  Eyes:     Pupils: Pupils are equal, round, and reactive to  light.  Cardiovascular:     Rate and Rhythm: Normal rate. Rhythm irregular.  Pulmonary:     Effort: Pulmonary effort is normal.  Abdominal:     Palpations: Abdomen is soft.  Musculoskeletal:        General: Normal range of motion.  Neurological:     Mental Status: She is alert and oriented to person, place, and time.  Psychiatric:        Mood and Affect: Mood normal.        Behavior: Behavior normal.        Thought Content: Thought content  normal.        Judgment: Judgment normal.             Total Time 35 minutes   Time spent includes: Detailed review of medical records (labs, imaging, vital signs), medically appropriate exam (mental status, respiratory, cardiac, skin), discussed with treatment team, counseling and educating patient, family and staff, documenting clinical information, medication management and coordination of care.  Angela Mason L. Angela Quin, DNP, FNP-BC Palliative Medicine Team

## 2023-10-30 NOTE — Evaluation (Signed)
 Physical Therapy Evaluation Patient Details Name: Angela Mason MRN: [id number] DOB: [birth year] Today's Date: 10/30/2023  History of Present Illness  Angela Mason is a 89 y.o. female with medical history significant of HTN, HLD, PAF on Eliquis, COPD, hypothyroidism, sent from nursing home for duration of worsening of shortness of breath  Clinical Impression  Patient received in bed, she is pleasant and agrees to PT assessment. Patient requires CGA for bed mobility. Transfers with min A. Unsteady initially with standing. Patient ambulated 70 feet with RW and min A. She desaturated to 81% on room air with ambulation. Patient will continue to benefit from skilled PT to improve strength and safety with mobility.          If plan is discharge home, recommend the following: A little help with bathing/dressing/bathroom;Assist for transportation;A little help with walking and/or transfers   Can travel by private vehicle   Yes    Equipment Recommendations None recommended by PT  Recommendations for Other Services       Functional Status Assessment Patient has had a recent decline in their functional status and demonstrates the ability to make significant improvements in function in a reasonable and predictable amount of time.     Precautions / Restrictions Precautions Precautions: Fall Recall of Precautions/Restrictions: Intact Restrictions Weight Bearing Restrictions Per Provider Order: No      Mobility  Bed Mobility Overal bed mobility: Needs Assistance Bed Mobility: Supine to Sit     Supine to sit: Contact guard, HOB elevated     General bed mobility comments: reliant on bed rails, extra time    Transfers Overall transfer level: Needs assistance Equipment used: Rolling walker (2 wheels) Transfers: Sit to/from Stand Sit to Stand: Min assist, Contact guard assist           General transfer comment: attempted with CGA, but pt unable to safely come up into standing,  spontaneously returned to sitting EOB. minA needed for posterior lean, steadying    Ambulation/Gait Ambulation/Gait assistance: Contact guard assist Gait Distance (Feet): 70 Feet Assistive device: Rolling walker (2 wheels) Gait Pattern/deviations: Step-through pattern, Decreased step length - right, Decreased step length - left, Narrow base of support Gait velocity: decreased     General Gait Details: patient shaky with ambulation. Increased fall risk. O2 sats down to low 80%s with ambulation on room air.  Stairs            Wheelchair Mobility     Tilt Bed    Modified Rankin (Stroke Patients Only)       Balance Overall balance assessment: Needs assistance Sitting-balance support: Feet supported Sitting balance-Leahy Scale: Good     Standing balance support: Bilateral upper extremity supported, During functional activity, Reliant on assistive device for balance Standing balance-Leahy Scale: Fair Standing balance comment: requires B UE support for balance.                             Pertinent Vitals/Pain Pain Assessment Pain Assessment: No/denies pain    Home Living Family/patient expects to be discharged to:: Skilled nursing facility                 Home Equipment: Rollator (4 wheels) Additional Comments: [facility]    Prior Function Prior Level of Function : Needs assist             Mobility Comments: rollator at all times ADLs Comments: facility provides meals, cleaning, standby assist for bating (  does help her wash her back), assist to don compression socks     Extremity/Trunk Assessment   Upper Extremity Assessment Upper Extremity Assessment: Defer to OT evaluation    Lower Extremity Assessment Lower Extremity Assessment: Generalized weakness    Cervical / Trunk Assessment Cervical / Trunk Assessment: Kyphotic  Communication        Cognition Arousal: Alert Behavior During Therapy: WFL for tasks  assessed/performed   PT - Cognitive impairments: Memory                       PT - Cognition Comments: pt alert, oriented to self, place, situation Following commands: Intact       Cueing Cueing Techniques: Verbal cues     General Comments      Exercises     Assessment/Plan    PT Assessment Patient needs continued PT services  PT Problem List Decreased strength;Decreased activity tolerance;Decreased balance;Decreased mobility;Cardiopulmonary status limiting activity       PT Treatment Interventions Balance training;DME instruction;Gait training;Neuromuscular re-education;Stair training;Functional mobility training;Patient/family education;Therapeutic activities;Therapeutic exercise    PT Goals (Current goals can be found in the Care Plan section)  Acute Rehab PT Goals Patient Stated Goal: per chart daughter would like SNF at [facility] PT Goal Formulation: With family Time For Goal Achievement: 11/13/23 Potential to Achieve Goals: Good    Frequency Min 2X/week     Co-evaluation               AM-PAC PT "6 Clicks" Mobility  Outcome Measure Help needed turning from your back to your side while in a flat bed without using bedrails?: A Little Help needed moving from lying on your back to sitting on the side of a flat bed without using bedrails?: A Little Help needed moving to and from a bed to a chair (including a wheelchair)?: A Little Help needed standing up from a chair using your arms (e.g., wheelchair or bedside chair)?: A Little Help needed to walk in hospital room?: A Little Help needed climbing 3-5 steps with a railing? : A Lot 6 Click Score: 17    End of Session Equipment Utilized During Treatment: Gait belt Activity Tolerance: Patient tolerated treatment well Patient left: in chair;with call bell/phone within reach;with bed alarm set Nurse Communication: Mobility status PT Visit Diagnosis: Other abnormalities of gait and mobility  (R26.89);Difficulty in walking, not elsewhere classified (R26.2);Muscle weakness (generalized) (M62.81);Unsteadiness on feet (R26.81)    Time: 1610-9604 PT Time Calculation (min) (ACUTE ONLY): 16 min   Charges:   PT Evaluation $PT Eval Moderate Complexity: 1 Mod   PT General Charges $$ ACUTE PT VISIT: 1 Visit         Akshaya Toepfer, PT, GCS 10/30/23,11:57 AM

## 2023-10-30 NOTE — Plan of Care (Signed)
  Problem: Clinical Measurements: Goal: Will remain free from infection Outcome: Progressing Goal: Diagnostic test results will improve Outcome: Progressing Goal: Cardiovascular complication will be avoided Outcome: Progressing   Problem: Coping: Goal: Level of anxiety will decrease Outcome: Progressing   Problem: Elimination: Goal: Will not experience complications related to urinary retention Outcome: Progressing

## 2023-10-31 DIAGNOSIS — I480 Paroxysmal atrial fibrillation: Secondary | ICD-10-CM | POA: Diagnosis not present

## 2023-10-31 LAB — BASIC METABOLIC PANEL WITH GFR
Anion gap: 6 (ref 5–15)
BUN: 37 mg/dL — ABNORMAL HIGH (ref 8–23)
CO2: 34 mmol/L — ABNORMAL HIGH (ref 22–32)
Calcium: 8.5 mg/dL — ABNORMAL LOW (ref 8.9–10.3)
Chloride: 99 mmol/L (ref 98–111)
Creatinine, Ser: 0.83 mg/dL (ref 0.44–1.00)
GFR, Estimated: >60 mL/min (ref >60)
Glucose, Bld: 94 mg/dL (ref 70–99)
Potassium: 4.3 mmol/L (ref 3.5–5.1)
Sodium: 139 mmol/L (ref 135–145)

## 2023-10-31 LAB — MAGNESIUM: Magnesium: 2.8 mg/dL — ABNORMAL HIGH (ref 1.7–2.4)

## 2023-10-31 MED ORDER — METOPROLOL SUCCINATE ER 25 MG PO TB24: 12.5000 mg | ORAL_TABLET | Freq: Two times a day (BID) | ORAL | 0 refills | Status: AC

## 2023-10-31 MED ORDER — ALBUTEROL SULFATE HFA 108 (90 BASE) MCG/ACT IN AERS: 2.0000 | INHALATION_SPRAY | RESPIRATORY_TRACT | 1 refills | Status: DC | PRN

## 2023-10-31 MED ORDER — PREDNISONE 20 MG PO TABS: 40.0000 mg | ORAL_TABLET | Freq: Every day | ORAL | 0 refills | Status: AC

## 2023-10-31 MED ORDER — MOMETASONE FURO-FORMOTEROL FUM 200-5 MCG/ACT IN AERO: 2.0000 | INHALATION_SPRAY | Freq: Two times a day (BID) | RESPIRATORY_TRACT | 1 refills | Status: DC

## 2023-10-31 MED ORDER — FUROSEMIDE 20 MG PO TABS: 20.0000 mg | ORAL_TABLET | Freq: Every day | ORAL | 0 refills | Status: DC

## 2023-10-31 MED ORDER — AMIODARONE HCL 200 MG PO TABS: ORAL_TABLET | ORAL | 0 refills | Status: DC

## 2023-10-31 NOTE — NC FL2 (Signed)
 [place] MEDICAID FL2 LEVEL OF CARE FORM     IDENTIFICATION  Patient Name: Angela Mason Birthdate: [birth year] Sex: female Admission Date (Current Location): 10/28/2023  [facility] and [place] Number:  Chiropodist and Address:  [facility], [address] [place], [place] [address]      Provider Number: [id number]  Attending Physician Name and Address:  Tresa Moore, MD  Relative Name and Phone Number:  Dondra Spry (Daughter)  [phone] [facility])    Current Level of Care: Hospital Recommended Level of Care: Skilled Nursing Facility Prior Approval Number:    Date Approved/Denied:   PASRR Number: [id number]  Discharge Plan:      Current Diagnoses: Patient Active Problem List   Diagnosis Date Noted   Acute hypoxic respiratory failure (HCC) 10/29/2023   Afib (HCC) 10/28/2023   A-fib (HCC) 10/17/2023   Acute CHF (HCC) 10/17/2023   Myocardial injury 10/17/2023   COPD with acute exacerbation (HCC) 10/17/2023   Acute respiratory failure with hypoxia (HCC) 10/17/2023   Acute paranoid reaction (HCC) 09/12/2022   Constipation by delayed colonic transit 08/06/2022   Pedal edema 08/06/2022   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) 05/12/2022   HLD (hyperlipidemia) 04/30/2022   Hypothyroidism 04/30/2022   Other symptoms and signs involving cognitive functions and awareness 03/03/2022   History of falling 02/01/2022   Muscle weakness (generalized) 02/01/2022   Need for assistance with personal care 02/01/2022   Other abnormalities of gait and mobility 02/01/2022   Other specified fracture of left pubis, subsequent encounter for fracture with routine healing 02/01/2022   Personal history of other malignant neoplasm of skin 02/01/2022   Protein-calorie malnutrition, severe 01/29/2022   Closed fracture of superior ramus of left pubis (HCC) 01/27/2022   Leukocytosis  01/27/2022   Accidental fall 01/27/2022   COPD (chronic obstructive pulmonary disease) (HCC) 01/27/2022   MDD (major depressive disorder), recurrent episode, moderate (HCC) 07/31/2021   Insomnia 07/31/2021   History of delirium 03/05/2021   High risk medication use 03/05/2021   Unspecified macular degeneration 10/16/2020   Severe major depression, single episode, with psychotic features (HCC) 10/12/2020   UTI (urinary tract infection) 10/04/2020   HTN (hypertension)    Hyponatremia    Underweight 08/08/2020   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 08/04/2018   Cystocele, midline 05/05/2017   Nocturia 10/07/2016   Unstable bladder 10/07/2016   Vaginal atrophy 01/23/2016   Procidentia of uterus 11/01/2015   Midline cystocele 11/01/2015    Orientation RESPIRATION BLADDER Height & Weight     Self, Time, Situation, Place  O2 Continent Weight: 95 lb 14.4 oz (43.5 kg) Height:  5' (152.4 cm)  BEHAVIORAL SYMPTOMS/MOOD NEUROLOGICAL BOWEL NUTRITION STATUS      Continent Diet (heart)  AMBULATORY STATUS COMMUNICATION OF NEEDS Skin   Limited Assist Verbally Bruising, Skin abrasions                       Personal Care Assistance Level of Assistance  Bathing, Feeding, Dressing Bathing Assistance: Limited assistance Feeding assistance: Limited assistance Dressing Assistance: Limited assistance     Functional Limitations Info    Sight Info: Impaired Hearing Info: Adequate      SPECIAL CARE FACTORS FREQUENCY  PT (By licensed PT), OT (By licensed OT)     PT Frequency: 5 times per week OT Frequency: 5 times per week            Contractures  Additional Factors Info  Code Status, Allergies Code Status Info: full Allergies Info: Gentamicin, Levofloxacin           Current Medications (10/31/2023):  This is the current hospital active medication list Current Facility-Administered Medications  Medication Dose Route Frequency Provider Last Rate  Last Admin   acetaminophen (TYLENOL) tablet 650 mg  650 mg Oral Q4H PRN Mikey College T, MD       amiodarone (PACERONE) tablet 400 mg  400 mg Oral BID Hudson, Newell, PA-C   400 mg at 10/30/23 2030   Followed by   Melene Muller ON 11/05/2023] amiodarone (PACERONE) tablet 200 mg  200 mg Oral Daily Hudson, Adena, PA-C       apixaban (ELIQUIS) tablet 2.5 mg  2.5 mg Oral BID Hudson, Caralyn, PA-C   2.5 mg at 10/30/23 2030   ARIPiprazole (ABILIFY) tablet 2 mg  2 mg Oral Q breakfast Mikey College T, MD   2 mg at 10/30/23 0840   budesonide (PULMICORT) nebulizer solution 2 mg  2 mg Nebulization Q12H Mikey College T, MD   2 mg at 10/31/23 0737   dapagliflozin propanediol (FARXIGA) tablet 10 mg  10 mg Oral Daily Mikey College T, MD   10 mg at 10/30/23 0839   erythromycin ophthalmic ointment 1 Application  1 Application Both Eyes QHS Mikey College T, MD   1 Application at 10/29/23 2131   furosemide (LASIX) tablet 20 mg  20 mg Oral Daily Hudson, Caralyn, PA-C   20 mg at 10/30/23 1130   ipratropium (ATROVENT) nebulizer solution 0.5 mg  0.5 mg Nebulization BID Lolita Patella B, MD   0.5 mg at 10/31/23 0737   levalbuterol (XOPENEX) nebulizer solution 0.63 mg  0.63 mg Nebulization Q6H PRN Mikey College T, MD   0.63 mg at 10/29/23 1212   metoprolol succinate (TOPROL-XL) 24 hr tablet 12.5 mg  12.5 mg Oral BID Hudson, Caralyn, PA-C   12.5 mg at 10/30/23 2030   mometasone-formoterol (DULERA) 200-5 MCG/ACT inhaler 2 puff  2 puff Inhalation BID Mikey College T, MD   2 puff at 10/30/23 2030   nystatin (MYCOSTATIN/NYSTOP) topical powder 1 Application  1 Application Topical BID Mikey College T, MD       pravastatin (PRAVACHOL) tablet 20 mg  20 mg Oral q1800 Mikey College T, MD   20 mg at 10/30/23 1854   predniSONE (DELTASONE) tablet 40 mg  40 mg Oral Q breakfast Mikey College T, MD   40 mg at 10/30/23 9147     Discharge Medications: Please see discharge summary for a list of discharge medications.  Relevant Imaging Results:  Relevant  Lab Results:   Additional Information SS #: [id number]  [name], LCSW

## 2023-10-31 NOTE — TOC Transition Note (Signed)
 Transition of Care [facility]) - Discharge Note   Patient Details  Name: Angela Mason MRN: [id number] Date of Birth: 1932/11/12  Transition of Care [facility] CM/SW Contact:  Liliana Cline, LCSW Phone Number: 10/31/2023, 11:31 AM   Clinical Narrative:    Discharge to [facility] today. Room 104. Confirmed with Admissions Worker [name]. Updated MD, RN, and patient's daughter [name]. Per MD, patient with new o2 and does not have a portable tank. Per Onalee Hua with [facility], patient may be billed for the ambulance ride if insurance does not cover- [name] at [facility] checked if they can provide a tank for transport but their Wellsite geologist said no due to this being unsafe - called patient's daughter who states she wants EMS set up and that it should be covered as it was on the last admission on 3/26. Asked RN to call report. EMS paperwork completed. Called [facility] back, spoke with [place]. Arranged EMS pick up for 12:00 - RN and MD updated.    Final next level of care: Skilled Nursing Facility Barriers to Discharge: Barriers Resolved   Patient Goals and CMS Choice   CMS Medicare.gov Compare Post Acute Care list provided to:: Patient Represenative (must comment) Choice offered to / list presented to : Adult Children      Discharge Placement              Patient chooses bed at: [facility] Patient to be transferred to facility by: [facility] Name of family member notified: [name] - daughter Patient and family notified of of transfer: 10/31/23  Discharge Plan and Services Additional resources added to the After Visit Summary for                                       Social Drivers of Health (SDOH) Interventions SDOH Screenings   Food Insecurity: No Food Insecurity (10/28/2023)  Housing: Low Risk  (10/28/2023)  Transportation Needs: No Transportation Needs (10/28/2023)  Utilities: Not At Risk (10/28/2023)  Depression (PHQ2-9): Low Risk  (01/14/2023)  Social Connections:  Socially Isolated (10/28/2023)  Tobacco Use: Medium Risk (10/28/2023)     Readmission Risk Interventions     No data to display

## 2023-10-31 NOTE — Discharge Summary (Signed)
 Physician Discharge Summary  Bobbijo Holst WUJ:[id number] DOB: 10/01/1932 DOA: 10/28/2023  PCP: [name], MD  Admit date: 10/28/2023 Discharge date: 10/31/2023  Admitted From: [facility] Disposition:  [place]  Recommendations for Outpatient Follow-up:  Follow up with PCP in 1-2 weeks Follow up cardiology 1- weeks Consider outpatient referral to pulmonary  Home Health:No  Equipment/Devices:Oxygen 2L via [place]   Discharge Condition:Stable  CODE STATUS:FULL  Diet recommendation: Reg  Brief/Interim Summary:   89 y.o. female with medical history significant of HTN, HLD, PAF on Eliquis, COPD, hypothyroidism, sent from nursing home for duration of worsening of shortness of breath.   Symptoms started yesterday, patient started to have dry cough wheezing and shortness of breath, denied any chest pain no fever or chills.  At baseline patient is fragile, she complains about occasional shortness of breath associated with walking, but denied any chest pains.  She was never diagnosed with COPD, no formal lung function test in the past.   ED Course: Tachycardia and telemonitoring showed A-fib, blood pressure was high and patient was started on nitroglycerin drip.  EKG showed ST elevation on lead V3 and 4, troponin 81, code STEMI was called.  Repeat EKG showed resolution of ST changes. Blood gas 7.2 2/99/48, patient was started on BiPAP.  Blood work showed hemoglobin 13, WBC 13, creatinine 1.1, K4.3.   Cardiology evaluated the patient and consulted STEMI code patient was started on heparin drip.  Patient was started on amiodarone drip, briefly patient heart rate come back to normal sinus however soon went back to rapid A-fib again.   4/3: Patient weaned to nasal cannula.  Seems to be saturating well.  Heart rate improved on amiodarone gtt.   4/4: Patient back in atrial fibrillation.  Metoprolol restarting today 4/5: Rate controlled.  Resp status at baseline.  Appropriate for return to [facility]      Discharge Diagnoses:  Principal Problem:   Afib (HCC) Active Problems:   A-fib (HCC)   COPD with acute exacerbation (HCC)   Acute hypoxic respiratory failure (HCC) Acute hypoxic and hypercapnic respiratory failure Acute decompensated respiratory acidosis COPD exacerbation Initially required NIPPV.  Did not require intubation.  Respiratory status is improving.  Weaned to nasal cannula. Plan: Oxygen, wean as tolerated.  Currently on baseline 2L P.o. prednisone 40 mg daily x 5 days, prescribed on dc Inhaler regimen (prescribed on dc) May return to [facility] FU OP PCP Consider referral to pulmonary as outpatient  A-fib with RVR Back in rapid atrial fibrillation/4 AM HR improved at time of dc Plan: Oral amiodarone Oral metoprolol Reduced dose Eliquis Outpatient follow up with Dr. Darrold Junker   Elevated troponins -Troponin uptrending 81 > 148, no chest pains, code STEMI canceled.  ST-T changes on V3 and V4 was considered to related to tachycardia.  No significant delta and troponins.  Suspect supply/demand ischemia in the setting of decompensated COPD and rapid atrial fibrillation Plan: No plans for ischemic evaluation during admission FU OP cards   Acute on chronic HFpEF decompensated -Secondary to A-fib with RVR, management as above -Lasix 20 mg p.o. daily starting 4/4   Moderate protein calorie malnutrition -BMI 18 -Continue protein supplement   Discharge Instructions  Discharge Instructions     Diet - low sodium heart healthy   Complete by: As directed    Increase activity slowly   Complete by: As directed    No wound care   Complete by: As directed       Allergies as  of 10/31/2023       Reactions   Gentamicin    Levofloxacin Diarrhea        Medication List     TAKE these medications    acetaminophen 325 MG tablet Commonly known as: TYLENOL Take 650 mg by mouth every 4 (four) hours as needed.   albuterol 108 (90 Base) MCG/ACT  inhaler Commonly known as: VENTOLIN HFA Inhale 2 puffs into the lungs every 4 (four) hours as needed for wheezing or shortness of breath.   amiodarone 200 MG tablet Commonly known as: PACERONE Take 2 tablets (400 mg total) by mouth 2 (two) times daily for 5 days, THEN 1 tablet (200 mg total) daily. Start taking on: October 31, 2023 What changed: See the new instructions.   apixaban 2.5 MG Tabs tablet Commonly known as: ELIQUIS Take 1 tablet (2.5 mg total) by mouth 2 (two) times daily.   ARIPiprazole 2 MG tablet Commonly known as: Abilify Take 1 tablet (2 mg total) by mouth daily with breakfast.   dapagliflozin propanediol 10 MG Tabs tablet Commonly known as: FARXIGA Take 1 tablet (10 mg total) by mouth daily.   erythromycin ophthalmic ointment Place 1 Application into both eyes at bedtime.   estradiol 0.1 MG/GM vaginal cream Commonly known as: ESTRACE Place 1 Applicatorful vaginally 2 (two) times a week.   FISH OIL CONCENTRATE PO Take 1 tablet by mouth daily.   furosemide 20 MG tablet Commonly known as: LASIX Take 1 tablet (20 mg total) by mouth daily.   metoprolol succinate 25 MG 24 hr tablet Commonly known as: TOPROL-XL Take 0.5 tablets (12.5 mg total) by mouth 2 (two) times daily.   mometasone-formoterol 200-5 MCG/ACT Aero Commonly known as: DULERA Inhale 2 puffs into the lungs 2 (two) times daily.   nystatin powder Commonly known as: MYCOSTATIN/NYSTOP Apply 1 Application topically 2 (two) times daily.   OXYGEN 2 lpm for dyspnea or SOB   pravastatin 20 MG tablet Commonly known as: PRAVACHOL Take 1 tablet (20 mg total) by mouth daily at 6 PM.   predniSONE 20 MG tablet Commonly known as: DELTASONE Take 2 tablets (40 mg total) by mouth daily with breakfast for 4 days.   PreserVision AREDS Tabs Take 1 capsule by mouth 2 (two) times daily.   Refresh 1.4-0.6 % Soln Generic drug: Polyvinyl Alcohol-Povidone PF Place 1 drop into both eyes in the morning and at  bedtime.        Follow-up Information     Leanora Ivanoff, [place]. Go in 1 week(s).   Why: Appointment scheduled for 11/09/2023 at 11 AM with Leanora Ivanoff, PA Contact information: [phone] [address] [facility] [place] [address] [phone]                Allergies  Allergen Reactions   Gentamicin    Levofloxacin Diarrhea    Consultations: Cardiology- [facility]   Procedures/Studies: ECHOCARDIOGRAM COMPLETE Result Date: 10/28/2023    ECHOCARDIOGRAM REPORT   Patient Name:   Angela Mason Date of Exam: 10/28/2023 Medical Rec #:  [id number]       Height:       60.0 in Accession #:    [id number]      Weight:       99.8 lb Date of Birth:  [birth year]       BSA:          1.389 m Patient Age:    89 years        BP:  96/64 mmHg Patient Gender: F               HR:           82 bpm. Exam Location:  [facility] Procedure: 2D Echo, Cardiac Doppler and Color Doppler (Both Spectral and Color            Flow Doppler were utilized during procedure). Indications:     Abnormal ECG R94.31  History:         Patient has prior history of Echocardiogram examinations, most                  recent 10/18/2023. COPD; Risk Factors:Hypertension. Paroxysmal                  Afib.  Sonographer:     Cristela Blue Referring Phys:  [id number] CHRISTOPHER END Diagnosing Phys: Marcina Millard MD IMPRESSIONS  1. Left ventricular ejection fraction, by estimation, is 35 to 40%. The left ventricle has moderately decreased function. The left ventricle demonstrates regional wall motion abnormalities (see scoring diagram/findings for description). Left ventricular  diastolic parameters are indeterminate.  2. Right ventricular systolic function is normal. The right ventricular size is normal.  3. Left atrial size was mildly dilated.  4. Right atrial size was moderately dilated.  5. The mitral valve is normal in structure. No evidence of mitral valve regurgitation. No evidence of mitral stenosis.  6. The aortic valve is normal in  structure. Aortic valve regurgitation is not visualized. Mild aortic valve stenosis.  7. The inferior vena cava is normal in size with greater than 50% respiratory variability, suggesting right atrial pressure of 3 mmHg. FINDINGS  Left Ventricle: Left ventricular ejection fraction, by estimation, is 35 to 40%. The left ventricle has moderately decreased function. The left ventricle demonstrates regional wall motion abnormalities. Strain was performed and the global longitudinal strain is indeterminate. The left ventricular internal cavity size was normal in size. There is no left ventricular hypertrophy. Left ventricular diastolic parameters are indeterminate.  LV Wall Scoring: The mid inferoseptal segment, apical septal segment, and apex are hypokinetic. Right Ventricle: The right ventricular size is normal. No increase in right ventricular wall thickness. Right ventricular systolic function is normal. Left Atrium: Left atrial size was mildly dilated. Right Atrium: Right atrial size was moderately dilated. Pericardium: There is no evidence of pericardial effusion. Mitral Valve: The mitral valve is normal in structure. No evidence of mitral valve regurgitation. No evidence of mitral valve stenosis. Tricuspid Valve: The tricuspid valve is normal in structure. Tricuspid valve regurgitation is not demonstrated. No evidence of tricuspid stenosis. Aortic Valve: The aortic valve is normal in structure. Aortic valve regurgitation is not visualized. Mild aortic stenosis is present. Aortic valve mean gradient measures 7.0 mmHg. Aortic valve peak gradient measures 10.9 mmHg. Aortic valve area, by VTI measures 1.24 cm. Pulmonic Valve: The pulmonic valve was normal in structure. Pulmonic valve regurgitation is not visualized. No evidence of pulmonic stenosis. Aorta: The aortic root is normal in size and structure. Venous: The inferior vena cava is normal in size with greater than 50% respiratory variability, suggesting right  atrial pressure of 3 mmHg. IAS/Shunts: No atrial level shunt detected by color flow Doppler. Additional Comments: 3D was performed not requiring image post processing on an independent workstation and was indeterminate.  LEFT VENTRICLE PLAX 2D LVIDd:         3.30 cm LVIDs:         2.70 cm LV PW:  1.10 cm LV IVS:        1.50 cm LVOT diam:     2.00 cm LV SV:         31 LV SV Index:   22 LVOT Area:     3.14 cm  RIGHT VENTRICLE RV Basal diam:  3.50 cm RV Mid diam:    2.90 cm RV S prime:     12.40 cm/s TAPSE (M-mode): 1.2 cm LEFT ATRIUM             Index        RIGHT ATRIUM           Index LA diam:        2.80 cm 2.02 cm/m   RA Area:     15.70 cm LA Vol (A2C):   22.5 ml 16.20 ml/m  RA Volume:   41.30 ml  29.73 ml/m LA Vol (A4C):   12.5 ml 9.00 ml/m LA Biplane Vol: 17.8 ml 12.81 ml/m  AORTIC VALVE AV Area (Vmax):    1.17 cm AV Area (Vmean):   1.13 cm AV Area (VTI):     1.24 cm AV Vmax:           165.33 cm/s AV Vmean:          119.667 cm/s AV VTI:            0.251 m AV Peak Grad:      10.9 mmHg AV Mean Grad:      7.0 mmHg LVOT Vmax:         61.50 cm/s LVOT Vmean:        43.200 cm/s LVOT VTI:          0.099 m LVOT/AV VTI ratio: 0.39  AORTA Ao Root diam: 2.80 cm MITRAL VALVE               TRICUSPID VALVE MV Area (PHT): 6.96 cm    TR Peak grad:   21.3 mmHg MV Decel Time: 109 msec    TR Vmax:        231.00 cm/s MV E velocity: 87.50 cm/s                            SHUNTS                            Systemic VTI:  0.10 m                            Systemic Diam: 2.00 cm Marcina Millard MD Electronically signed by Marcina Millard MD Signature Date/Time: 10/28/2023/1:43:00 PM    Final    DG Chest Portable 1 View Result Date: 10/28/2023 CLINICAL DATA:  Shortness of breath. EXAM: PORTABLE CHEST 1 VIEW COMPARISON:  10/17/2023 FINDINGS: Interstitial markings are diffusely coarsened with chronic features. Interval development of interstitial and alveolar opacity in the right upper lung. Small bilateral pleural  effusions suspected. The cardiopericardial silhouette is within normal limits for size. Bones are diffusely demineralized. Telemetry leads overlie the chest. IMPRESSION: 1. Interval development of interstitial and alveolar opacity in the right upper lung, suspicious for pneumonia although asymmetric edema is also a consideration. 2. Small bilateral pleural effusions. Electronically Signed   By: Kennith Center M.D.   On: 10/28/2023 05:12   ECHOCARDIOGRAM COMPLETE Result Date: 10/18/2023    ECHOCARDIOGRAM REPORT   Patient Name:  Janaki Kolden Date of Exam: 10/18/2023 Medical Rec #:  [id number]       Height:       60.0 in Accession #:    [id number]      Weight:       95.0 lb Date of Birth:  [birth year]       BSA:          1.360 m Patient Age:    89 years        BP:           118/83 mmHg Patient Gender: F               HR:           116 bpm. Exam Location:  [facility] Procedure: 2D Echo, Cardiac Doppler and Color Doppler (Both Spectral and Color            Flow Doppler were utilized during procedure). Indications:     CHF- Acute Diastolic I50.31  History:         Patient has no prior history of Echocardiogram examinations.  Sonographer:     Elwin Sleight RDCS Referring Phys:  Wynona Neat NIU Diagnosing Phys: Adrian Blackwater  Sonographer Comments: Image acquisition challenging due to respiratory motion. IMPRESSIONS  1. Left ventricular ejection fraction, by estimation, is 45 to 50%. The left ventricle has mildly decreased function. The left ventricle demonstrates global hypokinesis. The left ventricular internal cavity size was moderately dilated. There is mild concentric left ventricular hypertrophy. Left ventricular diastolic parameters are indeterminate.  2. Right ventricular systolic function is moderately reduced. The right ventricular size is moderately enlarged. Mildly increased right ventricular wall thickness.  3. Left atrial size was severely dilated.  4. Right atrial size was severely dilated.  5. The mitral valve is  grossly normal. Moderate mitral valve regurgitation.  6. Tricuspid valve regurgitation is mild to moderate.  7. The aortic valve is calcified. Aortic valve regurgitation is not visualized. Aortic valve sclerosis/calcification is present, without any evidence of aortic stenosis. FINDINGS  Left Ventricle: Left ventricular ejection fraction, by estimation, is 45 to 50%. The left ventricle has mildly decreased function. The left ventricle demonstrates global hypokinesis. Strain was performed and the global longitudinal strain is indeterminate. The left ventricular internal cavity size was moderately dilated. There is mild concentric left ventricular hypertrophy. Left ventricular diastolic parameters are indeterminate. Right Ventricle: The right ventricular size is moderately enlarged. Mildly increased right ventricular wall thickness. Right ventricular systolic function is moderately reduced. Left Atrium: Left atrial size was severely dilated. Right Atrium: Right atrial size was severely dilated. Pericardium: There is no evidence of pericardial effusion. Mitral Valve: The mitral valve is grossly normal. Moderate mitral valve regurgitation. Tricuspid Valve: The tricuspid valve is grossly normal. Tricuspid valve regurgitation is mild to moderate. Aortic Valve: The aortic valve is calcified. Aortic valve regurgitation is not visualized. Aortic valve sclerosis/calcification is present, without any evidence of aortic stenosis. Aortic valve peak gradient measures 10.6 mmHg. Pulmonic Valve: The pulmonic valve was grossly normal. Pulmonic valve regurgitation is mild. Aorta: The aortic root, ascending aorta and aortic arch are all structurally normal, with no evidence of dilitation or obstruction. IAS/Shunts: No atrial level shunt detected by color flow Doppler. Additional Comments: 3D was performed not requiring image post processing on an independent workstation and was indeterminate.  LEFT VENTRICLE PLAX 2D LVIDd:          3.20 cm   Diastology LVIDs:         2.40  cm   LV e' lateral: 12.40 cm/s LV PW:         1.10 cm LV IVS:        1.00 cm LVOT diam:     1.90 cm LV SV:         29 LV SV Index:   22 LVOT Area:     2.84 cm  RIGHT VENTRICLE RV Basal diam:  2.80 cm LEFT ATRIUM             Index        RIGHT ATRIUM           Index LA diam:        3.50 cm 2.57 cm/m   RA Area:     17.00 cm LA Vol (A2C):   32.7 ml 24.04 ml/m  RA Volume:   48.00 ml  35.29 ml/m LA Vol (A4C):   39.2 ml 28.82 ml/m LA Biplane Vol: 39.8 ml 29.26 ml/m  AORTIC VALVE                 PULMONIC VALVE AV Area (Vmax): 1.29 cm     PV Vmax:        0.64 m/s AV Vmax:        162.50 cm/s  PV Peak grad:   1.6 mmHg AV Peak Grad:   10.6 mmHg    RVOT Peak grad: 1 mmHg LVOT Vmax:      73.90 cm/s LVOT Vmean:     50.100 cm/s LVOT VTI:       0.104 m  AORTA Ao Root diam: 2.80 cm TRICUSPID VALVE TR Peak grad:   33.6 mmHg TR Vmax:        290.00 cm/s  SHUNTS Systemic VTI:  0.10 m Systemic Diam: 1.90 cm Adrian Blackwater Electronically signed by Adrian Blackwater Signature Date/Time: 10/18/2023/12:36:21 PM    Final    CT Angio Chest PE W and/or Wo Contrast Result Date: 10/17/2023 CLINICAL DATA:  Shortness of breath.  History of COPD.  Ex-smoker. EXAM: CT ANGIOGRAPHY CHEST WITH CONTRAST TECHNIQUE: Multidetector CT imaging of the chest was performed using the standard protocol during bolus administration of intravenous contrast. Multiplanar CT image reconstructions and MIPs were obtained to evaluate the vascular anatomy. RADIATION DOSE REDUCTION: This exam was performed according to the departmental dose-optimization program which includes automated exposure control, adjustment of the mA and/or kV according to patient size and/or use of iterative reconstruction technique. CONTRAST:  60mL OMNIPAQUE IOHEXOL 350 MG/ML SOLN COMPARISON:  Plain film of earlier today.  No prior CT. FINDINGS: Cardiovascular: The quality of this exam for evaluation of pulmonary embolism is good. Although the bolus is  well timed, limitations include minimal motion, EKG wire artifact and patient arm position, not raised above the head. No evidence of pulmonary embolism. Advanced aortic and branch vessel atherosclerosis. The aorta is not well opacified secondary to bolus timing. Moderate cardiomegaly, without pericardial effusion. Left main and 3 vessel coronary artery calcification. Mediastinum/Nodes: No mediastinal or hilar adenopathy. Lungs/Pleura: Small bilateral pleural effusions. Moderate centrilobular emphysema. Right middle lobe endobronchial compression/narrowing at the level of the proximal segmental bronchi including on 68/4. This results in right middle lobe collapse. No well-defined obstructive mass. Biapical pleuroparenchymal scarring. Left greater than right base dependent compressive atelectasis. Upper Abdomen: Reflux of contrast into the hepatic veins suggests elevated right heart pressures. Artifact degradation continuing into the upper abdomen. No gross acute finding. Musculoskeletal: Lower thoracic spondylosis with S-shaped thoracolumbar spine curvature. Review of the MIP images  confirms the above findings. IMPRESSION: 1.  No evidence of pulmonary embolism. 2. Mild multifocal degradation as detailed above. 3. Small bilateral pleural effusions. Findings of elevated right heart pressures. Suspect a component of fluid overload. 4. Right middle lobe endobronchial obstruction or compression at the level of the proximal subsegmental bronchi resulting in right middle lobe collapse. No well-defined obstructive mass. Consider further evaluation with bronchoscopy with attention to the right middle lobe bronchus. Alternatively, especially given patient age and comorbidities, CT follow-up at 6 months could be performed. 5. Aortic atherosclerosis (ICD10-I70.0), coronary artery atherosclerosis and emphysema (ICD10-J43.9). Electronically Signed   By: Jeronimo Greaves M.D.   On: 10/17/2023 18:40   DG Chest 2 View Result Date:  10/17/2023 CLINICAL DATA:  Shortness of breath EXAM: CHEST - 2 VIEW COMPARISON:  Chest radiograph dated 06/26/2022 FINDINGS: Normal lung volumes. No focal consolidations. Trace blunting of bilateral costophrenic angles. No pneumothorax. Similar mildly enlarged cardiomediastinal silhouette. No acute osseous abnormality. Unchanged compression deformity of T4. IMPRESSION: 1. Small bilateral pleural effusions. 2. Similar mild cardiomegaly. Electronically Signed   By: Agustin Cree M.D.   On: 10/17/2023 15:23      Subjective: Seen and examined on day of discharge.  Stable, appropriate for return to [facility]  Discharge Exam: Vitals:   10/31/23 0739 10/31/23 0750  BP:  (!) 141/72  Pulse:  61  Resp:    Temp:  (!) 96.9 F (36.1 C)  SpO2: 97% 100%   Vitals:   10/31/23 0040 10/31/23 0338 10/31/23 0739 10/31/23 0750  BP: 117/61 132/68  (!) 141/72  Pulse: 64 (!) 57  61  Resp: 18 20    Temp: 97.8 F (36.6 C) 97.8 F (36.6 C)  (!) 96.9 F (36.1 C)  TempSrc:  Oral    SpO2: 100% 100% 97% 100%  Weight:      Height:        General: Pt is alert, awake, not in acute distress Cardiovascular: RRR, S1/S2 +, no rubs, no gallops Respiratory: CTA bilaterally, no wheezing, no rhonchi Abdominal: Soft, NT, ND, bowel sounds + Extremities: no edema, no cyanosis    The results of significant diagnostics from this hospitalization (including imaging, microbiology, ancillary and laboratory) are listed below for reference.     Microbiology: Recent Results (from the past 240 hours)  Resp panel by RT-PCR (RSV, Flu A&B, Covid) Anterior Nasal Swab     Status: None   Collection Time: 10/28/23  4:57 AM   Specimen: Anterior Nasal Swab  Result Value Ref Range Status   SARS Coronavirus 2 by RT PCR NEGATIVE NEGATIVE Final    Comment: (NOTE) SARS-CoV-2 target nucleic acids are NOT DETECTED.  The SARS-CoV-2 RNA is generally detectable in upper respiratory specimens during the acute phase of infection. The  lowest concentration of SARS-CoV-2 viral copies this assay can detect is 138 copies/mL. A negative result does not preclude SARS-Cov-2 infection and should not be used as the sole basis for treatment or other patient management decisions. A negative result may occur with  improper specimen collection/handling, submission of specimen other than nasopharyngeal swab, presence of viral mutation(s) within the areas targeted by this assay, and inadequate number of viral copies(<138 copies/mL). A negative result must be combined with clinical observations, patient history, and epidemiological information. The expected result is Negative.  Fact Sheet for Patients:  BloggerCourse.com  Fact Sheet for Healthcare Providers:  SeriousBroker.it  This test is no t yet approved or cleared by the [place] FDA and  has  been authorized for detection and/or diagnosis of SARS-CoV-2 by FDA under an Emergency Use Authorization (EUA). This EUA will remain  in effect (meaning this test can be used) for the duration of the COVID-19 declaration under Section 564(b)(1) of the Act, 21 U.S.C.section 360bbb-3(b)(1), unless the authorization is terminated  or revoked sooner.       Influenza A by PCR NEGATIVE NEGATIVE Final   Influenza B by PCR NEGATIVE NEGATIVE Final    Comment: (NOTE) The Xpert Xpress SARS-CoV-2/FLU/RSV plus assay is intended as an aid in the diagnosis of influenza from Nasopharyngeal swab specimens and should not be used as a sole basis for treatment. Nasal washings and aspirates are unacceptable for Xpert Xpress SARS-CoV-2/FLU/RSV testing.  Fact Sheet for Patients: BloggerCourse.com  Fact Sheet for Healthcare Providers: SeriousBroker.it  This test is not yet approved or cleared by the [place] FDA and has been authorized for detection and/or diagnosis of SARS-CoV-2 by FDA under  an Emergency Use Authorization (EUA). This EUA will remain in effect (meaning this test can be used) for the duration of the COVID-19 declaration under Section 564(b)(1) of the Act, 21 U.S.C. section 360bbb-3(b)(1), unless the authorization is terminated or revoked.     Resp Syncytial Virus by PCR NEGATIVE NEGATIVE Final    Comment: (NOTE) Fact Sheet for Patients: BloggerCourse.com  Fact Sheet for Healthcare Providers: SeriousBroker.it  This test is not yet approved or cleared by the [place] FDA and has been authorized for detection and/or diagnosis of SARS-CoV-2 by FDA under an Emergency Use Authorization (EUA). This EUA will remain in effect (meaning this test can be used) for the duration of the COVID-19 declaration under Section 564(b)(1) of the Act, 21 U.S.C. section 360bbb-3(b)(1), unless the authorization is terminated or revoked.  Performed at [facility], [address] [place], [place] [address]   Blood culture (single)     Status: None (Preliminary result)   Collection Time: 10/28/23  5:00 AM   Specimen: BLOOD  Result Value Ref Range Status   Specimen Description BLOOD LEFT [facility]  Final   Special Requests   Final    BOTTLES DRAWN AEROBIC AND ANAEROBIC Blood Culture adequate volume   Culture   Final    NO GROWTH 3 DAYS Performed at [facility], [address], [place], [place] [address]    Report Status PENDING  Incomplete  Blood culture (single)     Status: None (Preliminary result)   Collection Time: 10/29/23  9:51 PM   Specimen: BLOOD  Result Value Ref Range Status   Specimen Description BLOOD LEFT [facility]  Final   Special Requests   Final    BOTTLES DRAWN AEROBIC AND ANAEROBIC Blood Culture adequate volume   Culture   Final    NO GROWTH 2 DAYS Performed at [facility], [address] [place], [place] [address]    Report Status PENDING  Incomplete  MRSA Next Gen by  PCR, Nasal     Status: None   Collection Time: 10/30/23  1:34 AM   Specimen: Nasal Mucosa; Nasal Swab  Result Value Ref Range Status   MRSA by PCR Next Gen NOT DETECTED NOT DETECTED Final    Comment: (NOTE) The GeneXpert MRSA Assay (FDA approved for NASAL specimens only), is one component of a comprehensive MRSA colonization surveillance program. It is not intended to diagnose MRSA infection nor to guide or monitor treatment for MRSA infections. Test performance is not FDA approved in patients less than 83 years old.  Performed at [facility], [address] [place], [place] [address]      Labs: BNP (last 3 results) Recent Labs    10/17/23 1500 10/28/23 0500  BNP 667.2* 1,228.0*   Basic Metabolic Panel: Recent Labs  Lab 10/28/23 0500 10/29/23 0321 10/30/23 0429 10/31/23 0606  NA 140 139 135 139  K 4.3 3.7 3.1* 4.3  CL 98 97* 92* 99  CO2 32 35* 35* 34*  GLUCOSE 217* 151* 129* 94  BUN 26* 34* 36* 37*  CREATININE 1.10* 1.02* 0.93 0.83  CALCIUM 8.9 8.1* 8.2* 8.5*  MG  --   --   --  2.8*   Liver Function Tests: No results for input(s): "AST", "ALT", "ALKPHOS", "BILITOT", "PROT", "ALBUMIN" in the last 168 hours. No results for input(s): "LIPASE", "AMYLASE" in the last 168 hours. No results for input(s): "AMMONIA" in the last 168 hours. CBC: Recent Labs  Lab 10/28/23 0500 10/29/23 0321  WBC 13.3* 9.9  NEUTROABS 10.0*  --   HGB 13.0 9.5*  HCT 42.6 29.7*  MCV 105.7* 101.4*  PLT 298 187   Cardiac Enzymes: No results for input(s): "CKTOTAL", "CKMB", "CKMBINDEX", "TROPONINI" in the last 168 hours. BNP: Invalid input(s): "POCBNP" CBG: No results for input(s): "GLUCAP" in the last 168 hours. D-Dimer No results for input(s): "DDIMER" in the last 72 hours. Hgb A1c No results for input(s): "HGBA1C" in the last 72 hours. Lipid Profile No results for input(s): "CHOL", "HDL", "LDLCALC", "TRIG", "CHOLHDL", "LDLDIRECT" in the last 72 hours. Thyroid function  studies No results for input(s): "TSH", "T4TOTAL", "T3FREE", "THYROIDAB" in the last 72 hours.  Invalid input(s): "FREET3" Anemia work up No results for input(s): "VITAMINB12", "FOLATE", "FERRITIN", "TIBC", "IRON", "RETICCTPCT" in the last 72 hours. Urinalysis    Component Value Date/Time   COLORURINE YELLOW (A) 01/28/2022 1254   APPEARANCEUR Hazy (A) 02/25/2023 1316   LABSPEC 1.011 01/28/2022 1254   PHURINE 5.0 01/28/2022 1254   GLUCOSEU Negative 02/25/2023 1316   HGBUR MODERATE (A) 01/28/2022 1254   BILIRUBINUR Negative 02/25/2023 1316   KETONESUR NEGATIVE 01/28/2022 1254   PROTEINUR Negative 02/25/2023 1316   PROTEINUR NEGATIVE 01/28/2022 1254   UROBILINOGEN 0.2 09/19/2022 1544   NITRITE Negative 02/25/2023 1316   NITRITE POSITIVE (A) 01/28/2022 1254   LEUKOCYTESUR 2+ (A) 02/25/2023 1316   LEUKOCYTESUR LARGE (A) 01/28/2022 1254   Sepsis Labs Recent Labs  Lab 10/28/23 0500 10/29/23 0321  WBC 13.3* 9.9   Microbiology Recent Results (from the past 240 hours)  Resp panel by RT-PCR (RSV, Flu A&B, Covid) Anterior Nasal Swab     Status: None   Collection Time: 10/28/23  4:57 AM   Specimen: Anterior Nasal Swab  Result Value Ref Range Status   SARS Coronavirus 2 by RT PCR NEGATIVE NEGATIVE Final    Comment: (NOTE) SARS-CoV-2 target nucleic acids are NOT DETECTED.  The SARS-CoV-2 RNA is generally detectable in upper respiratory specimens during the acute phase of infection. The lowest concentration of SARS-CoV-2 viral copies this assay can detect is 138 copies/mL. A negative result does not preclude SARS-Cov-2 infection and should not be used as the sole basis for treatment or other patient management decisions. A negative result may occur with  improper specimen collection/handling, submission of specimen other than nasopharyngeal swab, presence of viral mutation(s) within the areas targeted by this assay, and inadequate number of viral copies(<138 copies/mL). A negative  result must be combined with clinical observations, patient history, and epidemiological information. The expected result is Negative.  Fact Sheet for Patients:  BloggerCourse.com  Fact Sheet for Healthcare Providers:  SeriousBroker.it  This test is no t yet approved or cleared by the [place] FDA and  has been authorized for detection and/or diagnosis of SARS-CoV-2 by FDA under an Emergency Use Authorization (EUA). This EUA will remain  in effect (meaning this test can be used) for the duration of the COVID-19 declaration under Section 564(b)(1) of the Act, 21 U.S.C.section 360bbb-3(b)(1), unless the authorization is terminated  or revoked sooner.       Influenza A by PCR NEGATIVE NEGATIVE Final   Influenza B by PCR NEGATIVE NEGATIVE Final    Comment: (NOTE) The Xpert Xpress SARS-CoV-2/FLU/RSV plus assay is intended as an aid in the diagnosis of influenza from Nasopharyngeal swab specimens and should not be used as a sole basis for treatment. Nasal washings and aspirates are unacceptable for Xpert Xpress SARS-CoV-2/FLU/RSV testing.  Fact Sheet for Patients: BloggerCourse.com  Fact Sheet for Healthcare Providers: SeriousBroker.it  This test is not yet approved or cleared by the [place] FDA and has been authorized for detection and/or diagnosis of SARS-CoV-2 by FDA under an Emergency Use Authorization (EUA). This EUA will remain in effect (meaning this test can be used) for the duration of the COVID-19 declaration under Section 564(b)(1) of the Act, 21 U.S.C. section 360bbb-3(b)(1), unless the authorization is terminated or revoked.     Resp Syncytial Virus by PCR NEGATIVE NEGATIVE Final    Comment: (NOTE) Fact Sheet for Patients: BloggerCourse.com  Fact Sheet for Healthcare Providers: SeriousBroker.it  This  test is not yet approved or cleared by the [place] FDA and has been authorized for detection and/or diagnosis of SARS-CoV-2 by FDA under an Emergency Use Authorization (EUA). This EUA will remain in effect (meaning this test can be used) for the duration of the COVID-19 declaration under Section 564(b)(1) of the Act, 21 U.S.C. section 360bbb-3(b)(1), unless the authorization is terminated or revoked.  Performed at [facility], [address] [place], [place] [address]   Blood culture (single)     Status: None (Preliminary result)   Collection Time: 10/28/23  5:00 AM   Specimen: BLOOD  Result Value Ref Range Status   Specimen Description BLOOD LEFT [facility]  Final   Special Requests   Final    BOTTLES DRAWN AEROBIC AND ANAEROBIC Blood Culture adequate volume   Culture   Final    NO GROWTH 3 DAYS Performed at [facility], [address] [place], [place] [address]    Report Status PENDING  Incomplete  Blood culture (single)     Status: None (Preliminary result)   Collection Time: 10/29/23  9:51 PM   Specimen: BLOOD  Result Value Ref Range Status   Specimen Description BLOOD LEFT [facility]  Final   Special Requests   Final    BOTTLES DRAWN AEROBIC AND ANAEROBIC Blood Culture adequate volume   Culture   Final    NO GROWTH 2 DAYS Performed at [facility], [address], [place], [place] [address]    Report Status PENDING  Incomplete  MRSA Next Gen by PCR, Nasal     Status: None   Collection Time: 10/30/23  1:34 AM   Specimen: Nasal Mucosa; Nasal Swab  Result Value Ref Range Status   MRSA by PCR Next Gen NOT DETECTED NOT DETECTED Final    Comment: (NOTE) The GeneXpert MRSA Assay (FDA approved for NASAL specimens only), is one component of a comprehensive MRSA colonization surveillance  program. It is not intended to diagnose MRSA infection nor to guide or monitor treatment for MRSA infections. Test performance is not FDA approved in patients less  than 8 years old. Performed at [facility], [address] [place], [place] [address]      Time coordinating discharge: Over 30 minutes  SIGNED:   Tresa Moore, MD  [facility] 10/31/2023, 10:18 AM Pager   If 7PM-7AM, please contact night-coverage

## 2023-10-31 NOTE — Progress Notes (Signed)
 [facility]  SUBJECTIVE: Patient laying in bed, reports feeling better, less shortness of breath   Vitals:   10/31/23 0040 10/31/23 0338 10/31/23 0739 10/31/23 0750  BP: 117/61 132/68  (!) 141/72  Pulse: 64 (!) 57  61  Resp: 18 20    Temp: 97.8 F (36.6 C) 97.8 F (36.6 C)  (!) 96.9 F (36.1 C)  TempSrc:  Oral    SpO2: 100% 100% 97% 100%  Weight:      Height:         Intake/Output Summary (Last 24 hours) at 10/31/2023 0941 Last data filed at 10/31/2023 0000 Gross per 24 hour  Intake 240 ml  Output --  Net 240 ml      PHYSICAL EXAM  General: Well developed, well nourished, in no acute distress HEENT:  Normocephalic and atramatic Neck:  No JVD.  Lungs: Clear bilaterally to auscultation and percussion. Heart: HRRR . Normal S1 and S2 without gallops or murmurs.  Abdomen: Bowel sounds are positive, abdomen soft and non-tender  Msk:  Back normal, normal gait. Normal strength and tone for age. Extremities: No clubbing, cyanosis or edema.   Neuro: Alert and oriented X 3. Psych:  Good affect, responds appropriately   LABS: Basic Metabolic Panel: Recent Labs    10/30/23 0429 10/31/23 0606  NA 135 139  K 3.1* 4.3  CL 92* 99  CO2 35* 34*  GLUCOSE 129* 94  BUN 36* 37*  CREATININE 0.93 0.83  CALCIUM 8.2* 8.5*  MG  --  2.8*   Liver Function Tests: No results for input(s): "AST", "ALT", "ALKPHOS", "BILITOT", "PROT", "ALBUMIN" in the last 72 hours. No results for input(s): "LIPASE", "AMYLASE" in the last 72 hours. CBC: Recent Labs    10/29/23 0321  WBC 9.9  HGB 9.5*  HCT 29.7*  MCV 101.4*  PLT 187   Cardiac Enzymes: No results for input(s): "CKTOTAL", "CKMB", "CKMBINDEX", "TROPONINI" in the last 72 hours. BNP: Invalid input(s): "POCBNP" D-Dimer: No results for input(s): "DDIMER" in the last 72 hours. Hemoglobin A1C: No results for input(s): "HGBA1C" in the last 72 hours. Fasting Lipid Panel: No results for input(s): "CHOL", "HDL", "LDLCALC", "TRIG",  "CHOLHDL", "LDLDIRECT" in the last 72 hours. Thyroid Function Tests: No results for input(s): "TSH", "T4TOTAL", "T3FREE", "THYROIDAB" in the last 72 hours.  Invalid input(s): "FREET3" Anemia Panel: No results for input(s): "VITAMINB12", "FOLATE", "FERRITIN", "TIBC", "IRON", "RETICCTPCT" in the last 72 hours.  No results found.   Echo EF 35-40% 10/28/2023  TELEMETRY: Sinus bradycardia 57 bpm:  ASSESSMENT AND PLAN:  Principal Problem:   Afib (HCC) Active Problems:   A-fib (HCC)   COPD with acute exacerbation (HCC)   Acute hypoxic respiratory failure (HCC)    1.  Atrial fibrillation with RVR, converted to sinus rhythm, on low-dose Eliquis for stroke prevention, and amiodarone 400 mg twice daily, and metoprolol succinate 12.5 mg twice daily for rate and rhythm control 2.  Acute on chronic HFrEF, LVEF 35-40% 10/28/2023, on good medical management (metoprolol succinate, furosemide, dapagliflozin), oxygen saturation 100% on 2 L 3.  Mildly elevated troponin (81, 148, 150), in the absence of chest pain, likely demand supply ischemia, in the setting of atrial fibrillation with RVR and acute on chronic HFrEF  Recommendations  1.  Agree with current therapy 2.  Continue low-dose Eliquis for stroke invention 3.  Continue amiodarone load, reduce to 200 mg p.o. twice daily on 12/10/2023 4.  Continue good medical management (metoprolol succinate, dapagliflozin, furosemide) 5.  May consider discharge  to [facility] rehab later today 6.  Follow-up with me 1 to 2 weeks   Marcina Millard, MD, PhD, [facility] 10/31/2023 9:41 AM

## 2023-10-31 NOTE — Progress Notes (Signed)
 Discharging patient.  Called to give report to [facility].  Nurse not available to take report.  Patient came from this facility so I left message for nurse to call if she needs further information.

## 2023-11-02 ENCOUNTER — Encounter: Payer: Self-pay | Admitting: Student

## 2023-11-02 ENCOUNTER — Non-Acute Institutional Stay (SKILLED_NURSING_FACILITY): Payer: Self-pay | Admitting: Student

## 2023-11-02 DIAGNOSIS — I509 Heart failure, unspecified: Secondary | ICD-10-CM | POA: Diagnosis not present

## 2023-11-02 DIAGNOSIS — J449 Chronic obstructive pulmonary disease, unspecified: Secondary | ICD-10-CM

## 2023-11-02 DIAGNOSIS — K5901 Slow transit constipation: Secondary | ICD-10-CM

## 2023-11-02 DIAGNOSIS — Z7189 Other specified counseling: Secondary | ICD-10-CM

## 2023-11-02 DIAGNOSIS — I4891 Unspecified atrial fibrillation: Secondary | ICD-10-CM | POA: Diagnosis not present

## 2023-11-02 DIAGNOSIS — N952 Postmenopausal atrophic vaginitis: Secondary | ICD-10-CM

## 2023-11-02 LAB — CULTURE, BLOOD (SINGLE)
Culture: NO GROWTH
Special Requests: ADEQUATE

## 2023-11-02 NOTE — Progress Notes (Signed)
 Provider:  Sydnee Cabal [facility]:  [facility] Room Number: Nursing 104 Place of Service:  SNF (31)  PCP: [name], MD Patient Care Team: [name], MD as PCP - General (Family Medicine)  Extended Emergency Contact Information Primary Emergency Contact: Obey,[name] Address: Note:  my last name is OBEY, not Satira Sark Home Phone: [phone] Relation: Daughter Secondary Emergency Contact: Cumbee,Carolyn Address: [address] (neighbor - 100 yards away)          [place], [place] [address] [place] Phone: [phone] Relation: Friend  Code Status: DNR Goals of Care: Advanced Directive information    10/28/2023   11:17 AM  Advanced Directives  Does Patient Have a Medical Advance Directive? No  Type of Estate agent of [place];Living will  Does patient want to make changes to medical advance directive? No - Patient declined  Copy of Healthcare Power of Attorney in Chart? No - copy requested  Would patient like information on creating a medical advance directive? No - Patient declined      Chief Complaint  Patient presents with   Admission    HPI: Patient is a 89 y.o. female seen today for admission.  Discussed the use of AI scribe software for clinical note transcription with the patient, who gave verbal consent to proceed.  History of Present Illness  History of Present Illness The patient, with atrial fibrillation and COPD, presents for follow-up after recent hospitalizations. She is accompanied by her daughter, who is her primary caregiver.  From Monday to Wednesday, she experienced a significant change in her condition, including difficulty controlling her heart rate and requiring BiPAP for oxygen support. She was hospitalized twice recently, initially for atrial fibrillation and then for shortness of breath and chest pain, which she does not recall. During her hospital stays, she was  treated with amiodarone for heart rate control and started on Eliquis for atrial fibrillation. She also takes metoprolol to manage her heart rate.  She felt  jittery and had wobbly legs, which led to a pulse check and subsequent medical attention. No chest pain, but she notes difficulty with leg stability both currently and in the past.  She doesn't remember all of the details of going to the hospital.   She has a history of COPD, which was not actively managed with inhalers until recently. She now uses a regular inhaler twice daily and has an emergency inhaler on standby.  She has a history of burning with urination and vaginal itching, which has been a long-term issue. She uses topical estrogen twice a week but reports that it has not been effective recently.  She is on multiple medications including Abilify for mood, dapagliflozin for heart failure, and a statin for cholesterol management. She also takes fish oil and Preservision for eye health.  She has been consuming a high-protein diet to prevent muscle loss, including protein-fortified ice cream and Boost supplements. Her weight has increased slightly, which may be due to fluid retention.  Past Medical History:  Diagnosis Date   Cancer (HCC)    SKIN   COPD, mild (HCC)    Dyspnea    Edema    Heart failure with mildly reduced ejection fraction (HFmrEF) (HCC)    HLD (hyperlipidemia)    HTN (hypertension)    Hypothyroidism    Macular degeneration, bilateral    Paroxysmal atrial fibrillation (HCC)    Varicosities    Past Surgical History:  Procedure Laterality Date  CATARACT EXTRACTION W/PHACO Right 02/03/2017   Procedure: CATARACT EXTRACTION PHACO AND INTRAOCULAR LENS PLACEMENT (IOC);  Surgeon: Galen Manila, MD;  Location: [facility] ORS;  Service: Ophthalmology;  Laterality: Right;  [place] 00:57.2 AP% 18.5 CDE 10.58 Fluid pack lot # [id number]   CATARACT EXTRACTION W/PHACO Left 02/24/2017   Procedure: CATARACT EXTRACTION PHACO AND  INTRAOCULAR LENS PLACEMENT (IOC);  Surgeon: Galen Manila, MD;  Location: [facility] ORS;  Service: Ophthalmology;  Laterality: Left;  [place] 01:12 AP% 25.0 CDE 18.03 Fluid pack lot # [id number]   MOHS SURGERY  2015   nose   SALIVARY GLAND SURGERY Left 2012   TONSILLECTOMY  1940    reports that she quit smoking about 18 years ago. Her smoking use included cigarettes. She has never used smokeless tobacco. She reports that she does not currently use alcohol. She reports that she does not use drugs. Social History   Socioeconomic History   Marital status: Widowed    Spouse name: Not on file   Number of children: Not on file   Years of education: Not on file   Highest education level: Not on file  Occupational History   Not on file  Tobacco Use   Smoking status: Former    Current packs/day: 0.00    Types: Cigarettes    Quit date: 09/05/2005    Years since quitting: 18.1   Smokeless tobacco: Never  Vaping Use   Vaping status: Never Used  Substance and Sexual Activity   Alcohol use: Not Currently   Drug use: No   Sexual activity: Not Currently    Birth control/protection: Post-menopausal  Other Topics Concern   Not on file  Social History Narrative   Not on file   Social Drivers of Health   Financial Resource Strain: Not on file  Food Insecurity: No Food Insecurity (10/28/2023)   Hunger Vital Sign    Worried About Running Out of Food in the Last Year: Never true    Ran Out of Food in the Last Year: Never true  Transportation Needs: No Transportation Needs (10/28/2023)   PRAPARE - Administrator, Civil Service (Medical): No    Lack of Transportation (Non-Medical): No  Physical Activity: Not on file  Stress: Not on file  Social Connections: Socially Isolated (10/28/2023)   Social Connection and Isolation Panel [NHANES]    Frequency of Communication with Friends and Family: More than three times a week    Frequency of Social Gatherings with Friends and Family: Twice a week     Attends Religious Services: Never    Database administrator or Organizations: No    Attends Banker Meetings: Never    Marital Status: Widowed  Intimate Partner Violence: Not At Risk (10/28/2023)   Humiliation, Afraid, Rape, and Kick questionnaire    Fear of Current or Ex-Partner: No    Emotionally Abused: No    Physically Abused: No    Sexually Abused: No    Functional Status Survey:    Family History  Problem Relation Age of Onset   Diverticulitis Mother    Macular degeneration Mother    COPD Father    Heart failure Father    Epilepsy Sister    Parkinson's disease Brother    Cancer Neg Hx    Diabetes Neg Hx    Breast cancer Neg Hx     Health Maintenance  Topic Date Due   DTaP/Tdap/Td (1 - Tdap) Never done   DEXA SCAN  Never done  COVID-19 Vaccine (1) 06/12/2020   INFLUENZA VACCINE  02/26/2024   Medicare Annual Wellness (AWV)  07/08/2024   Pneumonia Vaccine 73+ Years old  Completed   Zoster Vaccines- Shingrix  Completed   HPV VACCINES  Aged Out    Allergies  Allergen Reactions   Gentamicin    Levofloxacin Diarrhea    Outpatient Encounter Medications as of 11/02/2023  Medication Sig   acetaminophen (TYLENOL) 325 MG tablet Take 650 mg by mouth every 4 (four) hours as needed.   albuterol (VENTOLIN HFA) 108 (90 Base) MCG/ACT inhaler Inhale 2 puffs into the lungs every 4 (four) hours as needed for wheezing or shortness of breath.   amiodarone (PACERONE) 200 MG tablet Take 2 tablets (400 mg total) by mouth 2 (two) times daily for 5 days, THEN 1 tablet (200 mg total) daily.   apixaban (ELIQUIS) 2.5 MG TABS tablet Take 1 tablet (2.5 mg total) by mouth 2 (two) times daily.   ARIPiprazole (ABILIFY) 2 MG tablet Take 1 tablet (2 mg total) by mouth daily with breakfast.   dapagliflozin propanediol (FARXIGA) 10 MG TABS tablet Take 1 tablet (10 mg total) by mouth daily.   erythromycin ophthalmic ointment Place 1 Application into both eyes at bedtime.   estradiol  (ESTRACE) 0.1 MG/GM vaginal cream Place 1 Applicatorful vaginally 2 (two) times a week.   furosemide (LASIX) 20 MG tablet Take 1 tablet (20 mg total) by mouth daily.   metoprolol succinate (TOPROL-XL) 25 MG 24 hr tablet Take 0.5 tablets (12.5 mg total) by mouth 2 (two) times daily.   mometasone-formoterol (DULERA) 200-5 MCG/ACT AERO Inhale 2 puffs into the lungs 2 (two) times daily.   Multiple Vitamins-Minerals (PRESERVISION AREDS) TABS Take 1 capsule by mouth 2 (two) times daily.   nystatin (MYCOSTATIN/NYSTOP) powder Apply 1 Application topically 2 (two) times daily.   Omega-3 Fatty Acids (FISH OIL CONCENTRATE PO) Take 1 tablet by mouth daily.    OXYGEN 2 lpm for dyspnea or SOB   Polyvinyl Alcohol-Povidone PF (REFRESH) 1.4-0.6 % SOLN Place 1 drop into both eyes in the morning and at bedtime.   pravastatin (PRAVACHOL) 20 MG tablet Take 1 tablet (20 mg total) by mouth daily at 6 PM.   predniSONE (DELTASONE) 20 MG tablet Take 2 tablets (40 mg total) by mouth daily with breakfast for 4 days.   No facility-administered encounter medications on file as of 11/02/2023.    Review of Systems  Vitals:   11/02/23 0936  BP: 123/82  Pulse: 63  Resp: 18  SpO2: 96%  Weight: 94 lb (42.6 kg)   Body mass index is 18.36 kg/m. Physical Exam Cardiovascular:     Rate and Rhythm: Normal rate. Rhythm irregular.     Pulses: Normal pulses.  Pulmonary:     Effort: Pulmonary effort is normal.     Breath sounds: No wheezing.     Comments: 1LNC Abdominal:     General: Abdomen is flat.     Palpations: Abdomen is soft.  Musculoskeletal:        General: No swelling.  Skin:    General: Skin is warm and dry.  Neurological:     Mental Status: She is alert.     Comments: Oriented to self. States she is in the hospital and cannot tell why she went. She knows her daughter in the room.    Labs reviewed: Basic Metabolic Panel: Recent Labs    10/17/23 1638 10/17/23 1930 10/29/23 0321 10/30/23 0429  10/31/23 0606  NA  --    < >  139 135 139  K  --    < > 3.7 3.1* 4.3  CL  --    < > 97* 92* 99  CO2  --    < > 35* 35* 34*  GLUCOSE  --    < > 151* 129* 94  BUN  --    < > 34* 36* 37*  CREATININE  --    < > 1.02* 0.93 0.83  CALCIUM  --    < > 8.1* 8.2* 8.5*  MG 2.3  --   --   --  2.8*  PHOS 3.2  --   --   --   --    < > = values in this interval not displayed.   Liver Function Tests: Recent Labs    11/03/22 0000 07/02/23 0000 10/17/23 1502  AST 19 18 30   ALT 18 13 39  ALKPHOS 74 76 70  BILITOT  --   --  0.9  PROT  --   --  6.4*  ALBUMIN 4.2 4.0 3.2*   No results for input(s): "LIPASE", "AMYLASE" in the last 8760 hours. No results for input(s): "AMMONIA" in the last 8760 hours. CBC: Recent Labs    11/03/22 0000 07/02/23 0000 10/17/23 1502 10/20/23 0600 10/28/23 0500 10/29/23 0321  WBC 6.5 7.5   < > 11.3* 13.3* 9.9  NEUTROABS 5,116.00 6,165.00  --   --  10.0*  --   HGB 12.1 11.7*   < > 10.3* 13.0 9.5*  HCT 36 36   < > 30.5* 42.6 29.7*  MCV  --   --    < > 97.1 105.7* 101.4*  PLT 310 252   < > 231 298 187   < > = values in this interval not displayed.   Cardiac Enzymes: No results for input(s): "CKTOTAL", "CKMB", "CKMBINDEX", "TROPONINI" in the last 8760 hours. BNP: Invalid input(s): "POCBNP" Lab Results  Component Value Date   HGBA1C 5.6 11/03/2022   Lab Results  Component Value Date   TSH 0.968 10/17/2023   Lab Results  Component Value Date   VITAMINB12 1,225 (H) 10/10/2020   No results found for: "FOLATE" No results found for: "IRON", "TIBC", "FERRITIN" Results    Imaging and Procedures obtained prior to SNF admission: ECHOCARDIOGRAM COMPLETE Result Date: 10/28/2023    ECHOCARDIOGRAM REPORT   Patient Name:   GRACILYN GUNIA Date of Exam: 10/28/2023 Medical Rec #:  [id number]       Height:       60.0 in Accession #:    [id number]      Weight:       99.8 lb Date of Birth:  [birth year]       BSA:          1.389 m Patient Age:    89 years        BP:            96/64 mmHg Patient Gender: F               HR:           82 bpm. Exam Location:  [facility] Procedure: 2D Echo, Cardiac Doppler and Color Doppler (Both Spectral and Color            Flow Doppler were utilized during procedure). Indications:     Abnormal ECG R94.31  History:         Patient has prior history of Echocardiogram examinations, most  recent 10/18/2023. COPD; Risk Factors:Hypertension. Paroxysmal                  Afib.  Sonographer:     Cristela Blue Referring Phys:  [id number] CHRISTOPHER END Diagnosing Phys: Marcina Millard MD IMPRESSIONS  1. Left ventricular ejection fraction, by estimation, is 35 to 40%. The left ventricle has moderately decreased function. The left ventricle demonstrates regional wall motion abnormalities (see scoring diagram/findings for description). Left ventricular  diastolic parameters are indeterminate.  2. Right ventricular systolic function is normal. The right ventricular size is normal.  3. Left atrial size was mildly dilated.  4. Right atrial size was moderately dilated.  5. The mitral valve is normal in structure. No evidence of mitral valve regurgitation. No evidence of mitral stenosis.  6. The aortic valve is normal in structure. Aortic valve regurgitation is not visualized. Mild aortic valve stenosis.  7. The inferior vena cava is normal in size with greater than 50% respiratory variability, suggesting right atrial pressure of 3 mmHg. FINDINGS  Left Ventricle: Left ventricular ejection fraction, by estimation, is 35 to 40%. The left ventricle has moderately decreased function. The left ventricle demonstrates regional wall motion abnormalities. Strain was performed and the global longitudinal strain is indeterminate. The left ventricular internal cavity size was normal in size. There is no left ventricular hypertrophy. Left ventricular diastolic parameters are indeterminate.  LV Wall Scoring: The mid inferoseptal segment, apical septal segment, and apex are  hypokinetic. Right Ventricle: The right ventricular size is normal. No increase in right ventricular wall thickness. Right ventricular systolic function is normal. Left Atrium: Left atrial size was mildly dilated. Right Atrium: Right atrial size was moderately dilated. Pericardium: There is no evidence of pericardial effusion. Mitral Valve: The mitral valve is normal in structure. No evidence of mitral valve regurgitation. No evidence of mitral valve stenosis. Tricuspid Valve: The tricuspid valve is normal in structure. Tricuspid valve regurgitation is not demonstrated. No evidence of tricuspid stenosis. Aortic Valve: The aortic valve is normal in structure. Aortic valve regurgitation is not visualized. Mild aortic stenosis is present. Aortic valve mean gradient measures 7.0 mmHg. Aortic valve peak gradient measures 10.9 mmHg. Aortic valve area, by VTI measures 1.24 cm. Pulmonic Valve: The pulmonic valve was normal in structure. Pulmonic valve regurgitation is not visualized. No evidence of pulmonic stenosis. Aorta: The aortic root is normal in size and structure. Venous: The inferior vena cava is normal in size with greater than 50% respiratory variability, suggesting right atrial pressure of 3 mmHg. IAS/Shunts: No atrial level shunt detected by color flow Doppler. Additional Comments: 3D was performed not requiring image post processing on an independent workstation and was indeterminate.  LEFT VENTRICLE PLAX 2D LVIDd:         3.30 cm LVIDs:         2.70 cm LV PW:         1.10 cm LV IVS:        1.50 cm LVOT diam:     2.00 cm LV SV:         31 LV SV Index:   22 LVOT Area:     3.14 cm  RIGHT VENTRICLE RV Basal diam:  3.50 cm RV Mid diam:    2.90 cm RV S prime:     12.40 cm/s TAPSE (M-mode): 1.2 cm LEFT ATRIUM             Index        RIGHT ATRIUM  Index LA diam:        2.80 cm 2.02 cm/m   RA Area:     15.70 cm LA Vol (A2C):   22.5 ml 16.20 ml/m  RA Volume:   41.30 ml  29.73 ml/m LA Vol (A4C):   12.5  ml 9.00 ml/m LA Biplane Vol: 17.8 ml 12.81 ml/m  AORTIC VALVE AV Area (Vmax):    1.17 cm AV Area (Vmean):   1.13 cm AV Area (VTI):     1.24 cm AV Vmax:           165.33 cm/s AV Vmean:          119.667 cm/s AV VTI:            0.251 m AV Peak Grad:      10.9 mmHg AV Mean Grad:      7.0 mmHg LVOT Vmax:         61.50 cm/s LVOT Vmean:        43.200 cm/s LVOT VTI:          0.099 m LVOT/AV VTI ratio: 0.39  AORTA Ao Root diam: 2.80 cm MITRAL VALVE               TRICUSPID VALVE MV Area (PHT): 6.96 cm    TR Peak grad:   21.3 mmHg MV Decel Time: 109 msec    TR Vmax:        231.00 cm/s MV E velocity: 87.50 cm/s                            SHUNTS                            Systemic VTI:  0.10 m                            Systemic Diam: 2.00 cm Marcina Millard MD Electronically signed by Marcina Millard MD Signature Date/Time: 10/28/2023/1:43:00 PM    Final    DG Chest Portable 1 View Result Date: 10/28/2023 CLINICAL DATA:  Shortness of breath. EXAM: PORTABLE CHEST 1 VIEW COMPARISON:  10/17/2023 FINDINGS: Interstitial markings are diffusely coarsened with chronic features. Interval development of interstitial and alveolar opacity in the right upper lung. Small bilateral pleural effusions suspected. The cardiopericardial silhouette is within normal limits for size. Bones are diffusely demineralized. Telemetry leads overlie the chest. IMPRESSION: 1. Interval development of interstitial and alveolar opacity in the right upper lung, suspicious for pneumonia although asymmetric edema is also a consideration. 2. Small bilateral pleural effusions. Electronically Signed   By: Kennith Center M.D.   On: 10/28/2023 05:12    Assessment/Plan Atrial Fibrillation with Demand Ischemia Episodes of atrial fibrillation with rapid ventricular response led to demand ischemia, initially suspected as myocardial infarction due to elevated troponin levels. Heart rate control was challenging, necessitating IV amiodarone and other  medications. She is on a loading dose of amiodarone, transitioning to a maintenance dose after five days, and is anticoagulated with Eliquis to prevent thromboembolic events. COPD likely contributed to recent episode of Afib w/ RVR.  - Continue amiodarone with a loading dose for five days, then reduce to a maintenance dose. - Continue Eliquis for anticoagulation. - Monitor heart rate and adjust metoprolol dosage to maintain heart rate below 110 bpm. - Follow up with cardiology on April 14th.  Chronic Obstructive Pulmonary  Disease (COPD) COPD management includes a regular inhaler regimen with mometasone and formoterol. Recent exacerbations required hospitalization and BiPAP. She has been educated on using a spacer to enhance inhaler efficacy. Beta agonists in inhalers necessitate careful heart rate management. - Continue mometasone and formoterol inhaler twice daily. - Use a spacer with the inhaler to improve medication delivery. - Educate on rinsing mouth after inhaler use to prevent thrush. - Follow up with pulmonology on April 11th.  Heart Failure with Reduced Ejection Fraction Heart failure with reduced ejection fraction is managed with dapagliflozin, which improves cardiac function and reduces hospitalization risk. She is aware of the potential for yeast infections as a side effect. Dapagliflozin also supports renal function and blood pressure control. - Continue dapagliflozin for heart failure management. - Monitor for signs of yeast infection and report if symptoms worsen.  Chronic Vaginal Itching and Burning Chronic vaginal itching and burning persist despite topical estrogen therapy. She is advised to report symptom exacerbation, particularly due to dapagliflozin's increased risk of yeast infections. - Continue topical estrogen twice weekly. - Report worsening symptoms.  Constipation Constipation may be related to recent hospitalization and routine changes. Miralax will be restarted  to alleviate symptoms. - Restart Miralax, half a capful three times a week.  General Health Maintenance She is on multiple medications, including a statin for cholesterol management and fish oil. Preservision is taken for eye health. Adequate nutrition and protein intake are emphasized to support recovery and prevent muscle loss. Fish oil is continued despite potential increased bruising risk due to anticoagulation. - Continue statin therapy for cholesterol management. - Continue fish oil supplementation. - Continue Preservision for eye health. - Ensure adequate protein intake, including options like salmon and protein-fortified ice cream. - Consider Boost or Ensure supplement in the afternoon to support nutritional needs.  Goals of Care She prefers no CPR or resuscitation in cardiac arrest, valuing comfort and quality of life over aggressive interventions. She is open to hospital readmission if necessary but prefers to remain at [facility]. - Document DNR status and ensure all staff are aware of her wishes. - Discuss and document preferences regarding hospital readmission.  Follow-up Scheduled follow-up appointments with cardiology and pulmonology to monitor and manage conditions. - Follow up with pulmonology on April 11th. - Follow up with cardiology on April 14th.  I spent greater than 60  minutes for the care of this patient in face to face time, chart review, clinical documentation, patient education. I spent an additional 16 minutes discussing goals of care and advanced care planning.   Family/ staff Communication: Nursing, HCPOA [name]).   Labs/tests ordered: CBC, BMP

## 2023-11-03 LAB — CULTURE, BLOOD (SINGLE)
Culture: NO GROWTH
Special Requests: ADEQUATE

## 2023-11-06 ENCOUNTER — Encounter: Payer: Self-pay | Admitting: Pulmonary Disease

## 2023-11-06 ENCOUNTER — Ambulatory Visit (INDEPENDENT_AMBULATORY_CARE_PROVIDER_SITE_OTHER): Admitting: Pulmonary Disease

## 2023-11-06 ENCOUNTER — Non-Acute Institutional Stay: Payer: Self-pay | Admitting: Student

## 2023-11-06 ENCOUNTER — Encounter: Payer: Self-pay | Admitting: Student

## 2023-11-06 VITALS — BP 118/80 | HR 95 | Temp 97.6°F | Ht 60.0 in | Wt 95.0 lb

## 2023-11-06 DIAGNOSIS — J9601 Acute respiratory failure with hypoxia: Secondary | ICD-10-CM

## 2023-11-06 DIAGNOSIS — I480 Paroxysmal atrial fibrillation: Secondary | ICD-10-CM

## 2023-11-06 DIAGNOSIS — J9602 Acute respiratory failure with hypercapnia: Secondary | ICD-10-CM | POA: Diagnosis not present

## 2023-11-06 DIAGNOSIS — I509 Heart failure, unspecified: Secondary | ICD-10-CM | POA: Diagnosis not present

## 2023-11-06 DIAGNOSIS — Z9981 Dependence on supplemental oxygen: Secondary | ICD-10-CM

## 2023-11-06 DIAGNOSIS — E43 Unspecified severe protein-calorie malnutrition: Secondary | ICD-10-CM

## 2023-11-06 DIAGNOSIS — I5020 Unspecified systolic (congestive) heart failure: Secondary | ICD-10-CM | POA: Diagnosis not present

## 2023-11-06 DIAGNOSIS — R600 Localized edema: Secondary | ICD-10-CM

## 2023-11-06 DIAGNOSIS — Z87898 Personal history of other specified conditions: Secondary | ICD-10-CM

## 2023-11-06 DIAGNOSIS — J449 Chronic obstructive pulmonary disease, unspecified: Secondary | ICD-10-CM

## 2023-11-06 DIAGNOSIS — R54 Age-related physical debility: Secondary | ICD-10-CM

## 2023-11-06 DIAGNOSIS — Z87891 Personal history of nicotine dependence: Secondary | ICD-10-CM

## 2023-11-06 DIAGNOSIS — J441 Chronic obstructive pulmonary disease with (acute) exacerbation: Secondary | ICD-10-CM

## 2023-11-06 DIAGNOSIS — I10 Essential (primary) hypertension: Secondary | ICD-10-CM | POA: Diagnosis not present

## 2023-11-06 NOTE — Patient Instructions (Signed)
 VISIT SUMMARY:  Today, we discussed your ongoing health concerns, including your chronic obstructive pulmonary disease (COPD), congestive heart failure (CHF), leg swelling, and potential urinary tract infection (UTI). We reviewed your current treatments and made some adjustments to better manage your symptoms and improve your quality of life.  YOUR PLAN:  -CHRONIC OBSTRUCTIVE PULMONARY DISEASE (COPD): COPD is a chronic lung disease that makes it hard to breathe. You will continue using your maintenance inhaler and we will try to reduce your oxygen dependency. Please continue using your inhaler as prescribed, and we will titrate your oxygen to 1 liter during rest. If your daytime oxygen levels are good, we may limit oxygen use to nighttime only. Remember to use your spacer with the inhaler.  -CONGESTIVE HEART FAILURE (CHF): CHF is a condition where the heart doesn't pump blood as well as it should. You have been switched from Lasix to Farxiga to help manage your fluid levels and improve heart function. Please monitor your weight daily to check for fluid retention, and continue taking Farxiga as prescribed. If you notice signs of fluid overload, such as increased swelling or shortness of breath, seek medical attention.  -PERIPHERAL EDEMA: Peripheral edema is swelling in the legs, often due to heart failure. Your leg swelling has improved with treatment and the use of compression stockings. Please continue wearing the compression stockings and monitor for any changes in swelling.  -POTENTIAL URINARY TRACT INFECTION (UTI): A UTI is an infection in any part of the urinary system. Although you don't have acute symptoms, we are monitoring for signs of a UTI due to an elevated white blood cell count. Please watch for symptoms like fever or burning during urination, and let your primary MD know if they occur.  INSTRUCTIONS:  Please schedule a follow-up appointment in 3-4 weeks to reassess your condition and  the effectiveness of your treatment. We will coordinate with your primary care and cardiology teams to ensure comprehensive management of your health.

## 2023-11-06 NOTE — Progress Notes (Signed)
 Subjective:    [name], female    DOB: 09-Oct-1932, 89 y.o.   MRN: [id number]  Patient Care Team: [name], MD as PCP - General (Family Medicine)  Chief Complaint  Patient presents with   [facility] follow up.    BACKGROUND: 89 y.o. former smoker with medical history significant of HTN, HLD, PAF on Eliquis, COPD, hypothyroidism, admitted to [facility] between 28 October 2023 and 31 October 2023 for atrial fibrillation with RVR.  She also had decompensation of systolic heart failure.  Elevated troponins at that time, B-natriuretic peptide 1228 consistent with CHF, echocardiogram showing LVEF of 35 to 40% a significant decline from her echocardiogram of 23 March.  Presents for evaluation of pulmonary function.  History of COPD with FEV1 unknown and no prior need for inhalers until recent admissions to [facility].  Was discharged on oxygen at 2 L/min due to acute respiratory failure with hypoxia and hypercapnia.  She is kindly referred by Dr. [name].  HPI Discussed the use of AI scribe software for clinical note transcription with the patient, who gave verbal consent to proceed.  History of Present Illness   Angela Mason is an 89 year old female with congestive heart failure and COPD who presents with shortness of breath and oxygen dependency. She is accompanied by her daughter, [name], who is visiting from [place].  She has been experiencing persistent shortness of breath since her recent hospitalization. Her breathing has remained unchanged since discharge, and she is currently on oxygen therapy at a rate of two liters per minute, initiated during her hospital stay. She uses a maintenance inhaler twice daily [facility]) and has a rescue inhaler available, though it has not been needed recently. A spacer is used to aid in inhaler administration.  She reports swelling in her legs, managed with compression stockings, which she is using for the first time. Her heart  function is weak, with an ejection fraction of 35-40%, and fluid buildup was noted on a scan. She does not recall having a cough when taken to the hospital but was experiencing significant shortness of breath at that time. She has been on diuretics and medications to strengthen her heart, including Marcelline Deist, which was started recently. Lasix was discontinued as Marcelline Deist was introduced to manage her fluid status.  She has a history of urinary tract issues, with long-standing symptoms of burning during urination. She has undergone cystoscopies in the past and was taken off antibiotics due to overuse.  She has a significant smoking history of 50 years at one pack per day, which she has since quit. She previously worked in Warehouse manager positions and is currently residing in a temporary nursing unit but usually lives in an assisted living facility.     I have reviewed the records from her most recent hospitalization where she required Bilevel noninvasive ventilation due to acute respiratory failure with hypoxia and hypercapnia.  She was noted to be in decompensation of congestive heart failure of note her ejection fraction declined significantly between the 18 October 2023 echocardiogram and the 28 October 2023 echocardiogram.  She was noted at 1 point to be "wheezing" which I suspect was more consistent with "cardiac asthma" noting the degree of pulmonary edema and her chest x-ray.  She has a history of COPD but there is no FEV1 noted in her chart and she has never seen a pulmonologist nor required inhalers prior to her most recent hospitalizations.  She is very debilitated and frail  appearing though she is participating with PT at her current temporary nursing home.  It is required that she be weaned off of oxygen before she can be returned to assisted living.  Best case scenario would be that we can wean her to having oxygen only nocturnally.  She has not required rescue inhaler since being discharged from the hospital.   She is consistent with taking Dulera twice a day.  While in the exam room today we removed her oxygen and let her equilibrate.  On room air she was 90-91% saturated.  On 1 L/min she was able to maintain 94 to 95% while sitting quietly.  She stated she did not think she could walk today.  I have reviewed her chest CT from 17 October 2023, there was mild right middle lobe collapse likely related to mucous plugging and dependent compressive atelectasis.  She had bilateral pleural effusions and evidence of fluid overload.  She will need follow-up CT once she is more compensated from her current cardiac issues.   Review of Systems A 10 point review of systems was performed and it is as noted above otherwise negative.   Past Medical History:  Diagnosis Date   Cancer (HCC)    SKIN   COPD, mild (HCC)    Dyspnea    Edema    Heart failure with mildly reduced ejection fraction (HFmrEF) (HCC)    HLD (hyperlipidemia)    HTN (hypertension)    Hypothyroidism    Macular degeneration, bilateral    Paroxysmal atrial fibrillation (HCC)    Varicosities     Past Surgical History:  Procedure Laterality Date   CATARACT EXTRACTION W/PHACO Right 02/03/2017   Procedure: CATARACT EXTRACTION PHACO AND INTRAOCULAR LENS PLACEMENT (IOC);  Surgeon: Galen Manila, MD;  Location: [facility] ORS;  Service: Ophthalmology;  Laterality: Right;  [place] 00:57.2 AP% 18.5 CDE 10.58 Fluid pack lot # [id number]   CATARACT EXTRACTION W/PHACO Left 02/24/2017   Procedure: CATARACT EXTRACTION PHACO AND INTRAOCULAR LENS PLACEMENT (IOC);  Surgeon: Galen Manila, MD;  Location: [facility] ORS;  Service: Ophthalmology;  Laterality: Left;  [place] 01:12 AP% 25.0 CDE 18.03 Fluid pack lot # [id number]   MOHS SURGERY  2015   nose   SALIVARY GLAND SURGERY Left 2012   TONSILLECTOMY  1940    Patient Active Problem List   Diagnosis Date Noted   Acute hypoxic respiratory failure (HCC) 10/29/2023   Afib (HCC) 10/28/2023   A-fib (HCC) 10/17/2023    Acute CHF (HCC) 10/17/2023   Myocardial injury 10/17/2023   COPD with acute exacerbation (HCC) 10/17/2023   Acute respiratory failure with hypoxia (HCC) 10/17/2023   Acute paranoid reaction (HCC) 09/12/2022   Constipation by delayed colonic transit 08/06/2022   Pedal edema 08/06/2022   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) 05/12/2022   HLD (hyperlipidemia) 04/30/2022   Hypothyroidism 04/30/2022   Other symptoms and signs involving cognitive functions and awareness 03/03/2022   History of falling 02/01/2022   Muscle weakness (generalized) 02/01/2022   Need for assistance with personal care 02/01/2022   Other abnormalities of gait and mobility 02/01/2022   Other specified fracture of left pubis, subsequent encounter for fracture with routine healing 02/01/2022   Personal history of other malignant neoplasm of skin 02/01/2022   Protein-calorie malnutrition, severe 01/29/2022   Closed fracture of superior ramus of left pubis (HCC) 01/27/2022   Leukocytosis 01/27/2022   Accidental fall 01/27/2022   COPD (chronic obstructive pulmonary disease) (HCC) 01/27/2022  MDD (major depressive disorder), recurrent episode, moderate (HCC) 07/31/2021   Insomnia 07/31/2021   History of delirium 03/05/2021   High risk medication use 03/05/2021   Unspecified macular degeneration 10/16/2020   Severe major depression, single episode, with psychotic features (HCC) 10/12/2020   UTI (urinary tract infection) 10/04/2020   HTN (hypertension)    Hyponatremia    Underweight 08/08/2020   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 08/04/2018   Cystocele, midline 05/05/2017   Nocturia 10/07/2016   Unstable bladder 10/07/2016   Vaginal atrophy 01/23/2016   Procidentia of uterus 11/01/2015   Midline cystocele 11/01/2015    Family History  Problem Relation Age of Onset   Diverticulitis Mother    Macular degeneration Mother     COPD Father    Heart failure Father    Epilepsy Sister    Parkinson's disease Brother    Cancer Neg Hx    Diabetes Neg Hx    Breast cancer Neg Hx     Social History   Tobacco Use   Smoking status: Former    Current packs/day: 0.00    Types: Cigarettes    Quit date: 09/05/2005    Years since quitting: 18.1   Smokeless tobacco: Never  Substance Use Topics   Alcohol use: Not Currently    Allergies  Allergen Reactions   Gentamicin    Levofloxacin Diarrhea    Current Meds  Medication Sig   acetaminophen (TYLENOL) 325 MG tablet Take 650 mg by mouth every 4 (four) hours as needed.   albuterol (VENTOLIN HFA) 108 (90 Base) MCG/ACT inhaler Inhale 2 puffs into the lungs every 4 (four) hours as needed for wheezing or shortness of breath.   amiodarone (PACERONE) 200 MG tablet Take 2 tablets (400 mg total) by mouth 2 (two) times daily for 5 days, THEN 1 tablet (200 mg total) daily.   apixaban (ELIQUIS) 2.5 MG TABS tablet Take 1 tablet (2.5 mg total) by mouth 2 (two) times daily.   ARIPiprazole (ABILIFY) 2 MG tablet Take 1 tablet (2 mg total) by mouth daily with breakfast.   dapagliflozin propanediol (FARXIGA) 10 MG TABS tablet Take 1 tablet (10 mg total) by mouth daily.   estradiol (ESTRACE) 0.1 MG/GM vaginal cream Place 1 Applicatorful vaginally 2 (two) times a week.   furosemide (LASIX) 20 MG tablet Take 1 tablet (20 mg total) by mouth daily.   metoprolol succinate (TOPROL-XL) 25 MG 24 hr tablet Take 0.5 tablets (12.5 mg total) by mouth 2 (two) times daily.   mometasone-formoterol (DULERA) 200-5 MCG/ACT AERO Inhale 2 puffs into the lungs 2 (two) times daily.   Multiple Vitamins-Minerals (PRESERVISION AREDS) TABS Take 1 capsule by mouth 2 (two) times daily.   nystatin (MYCOSTATIN/NYSTOP) powder Apply 1 Application topically 2 (two) times daily.   Omega-3 Fatty Acids (FISH OIL CONCENTRATE PO) Take 1 tablet by mouth daily.    OXYGEN 2 lpm for dyspnea or SOB   polyethylene glycol powder  (GLYCOLAX/MIRALAX) 17 GM/SCOOP powder Take by mouth once. 0.5 scoop one time a day every Mon, Wed, Fri   Polyvinyl Alcohol-Povidone PF (REFRESH) 1.4-0.6 % SOLN Place 1 drop into both eyes in the morning and at bedtime.   pravastatin (PRAVACHOL) 20 MG tablet Take 1 tablet (20 mg total) by mouth daily at 6 PM.    Immunization History  Administered Date(s) Administered   Influenza, High Dose Seasonal PF 05/21/2017, 05/07/2022   Influenza-Unspecified 05/09/2021, 05/15/2023   PFIZER SARS-COV-2 Pediatric Vaccination 5-60yrs 08/09/2019, 09/06/2019, 06/12/2020  PNEUMOCOCCAL CONJUGATE-20 02/11/2022   PPD Test 10/16/2020, 10/26/2020, 02/01/2022, 02/10/2022   Pneumococcal Conjugate-13 08/08/2018   Zoster Recombinant(Shingrix) 02/05/2023, 06/10/2023        Objective:     BP 118/80 (BP Location: Right Arm, Patient Position: Sitting, Cuff Size: Normal)   Pulse 95   Temp 97.6 F (36.4 C) (Temporal)   Ht 5' (1.524 m)   Wt 95 lb (43.1 kg)   SpO2 100% Comment: 2L continous oxygen  BMI 18.55 kg/m   SpO2: 100 % (2L continous oxygen)  GENERAL: Very frail appearing elderly woman, slow to interact.  Resents in transport chair.  No respiratory distress on supplemental oxygen. HEAD: Normocephalic, atraumatic.  EYES: Pupils equal, round, reactive to light.  No scleral icterus.  MOUTH: Poor dentition, oral mucosa moist.  No thrush NECK: Supple. No thyromegaly. Trachea midline. No JVD.  No adenopathy. PULMONARY: Good air entry bilaterally.  No adventitious sounds. CARDIOVASCULAR: S1 and S2. Regular rate and rhythm with occasional extrasystoles.  No rubs murmurs gallops heard. ABDOMEN: Benign. MUSCULOSKELETAL: No joint deformity, no clubbing, 2+ edema of the lower extremities wearing compression stockings.  NEUROLOGIC: No overt focal deficit.  Mild psychomotor retardation. SKIN: Intact,warm,dry. PSYCH: Slow to interact but appropriate when doing so.  I have reviewed the available imaging from her  most recent admission.  CT angio and chest x-rays consistent with congestive heart failure CT angio of 22 March also showed area of potential mucous plugging and mild right middle lobe collapse.  This will need to be followed up when the patient is better compensated.      Assessment & Plan:     ICD-10-CM   1. COPD suggested by initial evaluation (HCC)  J44.9     2. Acute respiratory failure with hypoxia and hypercapnia (HCC)  J96.01    J96.02     3. Systolic CHF with reduced left ventricular function, NYHA class 3 (HCC)  I50.20     4. Paroxysmal atrial fibrillation (HCC)  I48.0     5. Frailty  R54      Assessment and Plan    Chronic Obstructive Pulmonary Disease (COPD) Currently on maintenance inhaler therapy with supplemental oxygen being titrated down. Experiences dyspnea but has not required rescue inhaler. Recent exercise tolerance test showed ability to walk 200 feet on 0.5 liters of oxygen with stable vitals, indicating potential for reduced oxygen needs. Goal is to minimize oxygen dependency, potentially limiting to nocturnal use. - Continue maintenance inhaler therapy as prescribed. - Titrate oxygen to 1 liter during rest and monitor oxygen saturation. - Continue oxygen with activity and titrate to maintain O2 sats at 90% or better. - Consider nocturnal oxygen use only if daytime oxygenation remains adequate. - Educate on proper inhaler technique and use of spacer.  Congestive Heart Failure (CHF) Recent exacerbation required hospitalization. Transitioned from Lasix to [place] for fluid management and cardiac function improvement. Notable improvement with reduced fluid retention, improved hemoglobin levels, and decreased edema. Plan to monitor weight and edema to assess fluid status and adjust diuretics as needed. Marcelline Deist expected to provide diuresis, reducing Lasix necessity. Monitoring for fluid overload is crucial. - Monitor daily weights to assess fluid retention. -  Educate on signs of fluid overload and when to seek medical attention. - Continue Farxiga as prescribed. - Discontinue Lasix unless fluid retention increases, per cardiology.  Peripheral Edema Leg swelling likely related to heart failure. Improved with heart failure treatment and compression stockings. - Continue use of compression stockings to manage leg swelling. -  Monitor for changes in swelling and report if it worsens.  Potential Urinary Tract Infection (UTI) Concern for potential UTI due to elevated white blood cell count, though no acute symptoms reported. Monitoring for urinary symptoms and fever is advised. - Monitor for signs of UTI, including fever and urinary symptoms. - Consider urinalysis if symptoms of UTI develop.  Follow-up Ongoing monitoring of heart failure and COPD management required. Follow-up necessary to assess treatment response and adjust care plan. - Schedule follow-up appointment in 3-4 weeks to reassess condition and treatment efficacy. - Coordinate care with primary care and cardiology teams for comprehensive management.     Patient will need PFTs in the future as well as repeat CT.  She needs to be better compensated to be able to undergo this testing.  I suspect the most recent decompensation was mostly cardiac and that her COPD decompensation was mostly "cardiac asthma".  Continue Dulera and as needed albuterol.  Continue management of congestive failure as per cardiology.  Will see the patient in follow-up in 3 to 4 weeks time she is to contact us prior to that time strainer difficulties arise.  Advised if symptoms do not improve or worsen, to please contact office for sooner follow up or seek emergency care.    I spent 62 minutes of dedicated to the care of this patient on the date of this encounter to include pre-visit review of records, face-to-face time with the patient discussing conditions above, post visit ordering of testing, clinical documentation  with the electronic health record, making appropriate referrals as documented, and communicating necessary findings to members of the patients care team.   C. Danice Goltz, MD Advanced Bronchoscopy PCCM [facility]    *This note was dictated using voice recognition software/Dragon.  Despite best efforts to proofread, errors can occur which can change the meaning. Any transcriptional errors that result from this process are unintentional and may not be fully corrected at the time of dictation.

## 2023-11-06 NOTE — Progress Notes (Signed)
 Location:  Other [facility] [place].  Nursing Home Room Number: [facility] 104A Place of Service:  SNF [phone]) Provider:  Valrie Gehrig, MD  Patient Care Team: Valrie Gehrig, MD as PCP - General [facility] Medicine)  Extended Emergency Contact Information Primary Emergency Contact: Obey,[name] Address: Note:  my last name is OBEY, not Champ Coma Home Phone: [phone] Relation: Daughter Secondary Emergency Contact: Cumbee,Carolyn Address: [address] (neighbor - 100 yards away)          [place], [place] [address] [place] Phone: [phone] Relation: Friend  Code Status:  DNR Goals of care: Advanced Directive information    10/28/2023   11:17 AM  Advanced Directives  Does Patient Have a Medical Advance Directive? No  Type of Estate agent of [place];Living will  Does patient want to make changes to medical advance directive? No - Patient declined  Copy of Healthcare Power of Attorney in Chart? No - copy requested  Would patient like information on creating a medical advance directive? No - Patient declined     Chief Complaint  Patient presents with   Atrial Fibrillation    Afib Follow up    HPI:  Pt is a 89 y.o. female seen today to Follow up AFIB. History of Present Illness The patient, with heart failure and COPD, presents with concerns regarding her heart and lung health. She is accompanied by her mother.  She has been experiencing issues with her heart and lungs, including heart failure and COPD. Her potassium levels recently dropped too low, leading to the discontinuation of Lasix. She is currently on Farxiga and Dupixent for her heart condition. Her oxygen requirements have decreased from two liters to one liter since returning from a previous visit.  Her blood pressure has been variable, with readings as low as 98/61 and a recent reading of 118/80 at a pulmonologist visit. She has a history of high blood pressure  and was previously on losartan. Currently, she is on pravastatin for cholesterol management.  She feels tired and lethargic, especially after physical therapy sessions. On a recent day, she was able to walk 150 feet, which was considered a significant improvement, but she felt exhausted afterward.  She monitors her fluid intake, with a general guideline of not exceeding six 150 ml cups per day. She does not feel the need for nutritional supplements like Boost, as she feels full and bloated after consuming them. Her weight has remained stable.  There is a concern about the fit of her compression socks, as the available sizes are too large for her, and she requires custom-fitted ones.    Past Medical History:  Diagnosis Date   Cancer (HCC)    SKIN   COPD, mild (HCC)    Dyspnea    Edema    Heart failure with mildly reduced ejection fraction (HFmrEF) (HCC)    HLD (hyperlipidemia)    HTN (hypertension)    Hypothyroidism    Macular degeneration, bilateral    Paroxysmal atrial fibrillation (HCC)    Varicosities    Past Surgical History:  Procedure Laterality Date   CATARACT EXTRACTION W/PHACO Right 02/03/2017   Procedure: CATARACT EXTRACTION PHACO AND INTRAOCULAR LENS PLACEMENT (IOC);  Surgeon: Clair Crews, MD;  Location: [facility] ORS;  Service: Ophthalmology;  Laterality: Right;  US  00:57.2 AP% 18.5 CDE 10.58 Fluid pack lot # [id number]   CATARACT EXTRACTION W/PHACO Left 02/24/2017   Procedure: CATARACT EXTRACTION PHACO AND INTRAOCULAR LENS PLACEMENT (IOC);  Surgeon: Clair Crews,  MD;  Location: [facility] ORS;  Service: Ophthalmology;  Laterality: Left;  US  01:12 AP% 25.0 CDE 18.03 Fluid pack lot # [id number]   MOHS SURGERY  2015   nose   SALIVARY GLAND SURGERY Left 2012   TONSILLECTOMY  1940    Allergies  Allergen Reactions   Gentamicin    Levofloxacin Diarrhea    Outpatient Encounter Medications as of 11/06/2023  Medication Sig   acetaminophen (TYLENOL) 325 MG tablet Take  650 mg by mouth every 4 (four) hours as needed.   albuterol (VENTOLIN HFA) 108 (90 Base) MCG/ACT inhaler Inhale 2 puffs into the lungs every 4 (four) hours as needed for wheezing or shortness of breath.   amiodarone (PACERONE) 200 MG tablet Take 2 tablets (400 mg total) by mouth 2 (two) times daily for 5 days, THEN 1 tablet (200 mg total) daily.   apixaban (ELIQUIS) 2.5 MG TABS tablet Take 1 tablet (2.5 mg total) by mouth 2 (two) times daily.   ARIPiprazole (ABILIFY) 2 MG tablet Take 1 tablet (2 mg total) by mouth daily with breakfast.   dapagliflozin propanediol (FARXIGA) 10 MG TABS tablet Take 1 tablet (10 mg total) by mouth daily.   estradiol (ESTRACE) 0.1 MG/GM vaginal cream Place 1 Applicatorful vaginally 2 (two) times a week.   furosemide (LASIX) 20 MG tablet Take 1 tablet (20 mg total) by mouth daily.   metoprolol succinate (TOPROL-XL) 25 MG 24 hr tablet Take 0.5 tablets (12.5 mg total) by mouth 2 (two) times daily.   mometasone-formoterol (DULERA) 200-5 MCG/ACT AERO Inhale 2 puffs into the lungs 2 (two) times daily.   Multiple Vitamins-Minerals (PRESERVISION AREDS) TABS Take 1 capsule by mouth 2 (two) times daily.   nystatin (MYCOSTATIN/NYSTOP) powder Apply 1 Application topically 2 (two) times daily.   Omega-3 Fatty Acids (FISH OIL CONCENTRATE PO) Take 1 tablet by mouth daily.    OXYGEN 2 lpm for dyspnea or SOB   polyethylene glycol powder (GLYCOLAX/MIRALAX) 17 GM/SCOOP powder Take by mouth once. 0.5 scoop one time a day every Mon, Wed, Fri   Polyvinyl Alcohol-Povidone PF (REFRESH) 1.4-0.6 % SOLN Place 1 drop into both eyes in the morning and at bedtime.   pravastatin (PRAVACHOL) 20 MG tablet Take 1 tablet (20 mg total) by mouth daily at 6 PM.   erythromycin ophthalmic ointment Place 1 Application into both eyes at bedtime. (Patient not taking: Reported on 11/06/2023)   No facility-administered encounter medications on file as of 11/06/2023.    Review of Systems  Immunization History   Administered Date(s) Administered   Influenza, High Dose Seasonal PF 05/21/2017, 05/07/2022   Influenza-Unspecified 05/09/2021, 05/15/2023   PFIZER SARS-COV-2 Pediatric Vaccination 5-66yrs 08/09/2019, 09/06/2019, 06/12/2020   PNEUMOCOCCAL CONJUGATE-20 02/11/2022   PPD Test 10/16/2020, 10/26/2020, 02/01/2022, 02/10/2022   Pneumococcal Conjugate-13 08/08/2018   Zoster Recombinant(Shingrix) 02/05/2023, 06/10/2023   Pertinent  Health Maintenance Due  Topic Date Due   DEXA SCAN  Never done   INFLUENZA VACCINE  02/26/2024      09/19/2022    2:02 PM 11/14/2022    1:17 PM 01/14/2023    1:48 PM 01/30/2023    3:20 PM 03/03/2023    3:50 PM  Fall Risk  Falls in the past year? 0 0 0 0 0  Was there an injury with Fall? 0 0 0 0 0  Fall Risk Category Calculator 0 0 0 0 0  Patient at Risk for Falls Due to No Fall Risks   No Fall Risks Impaired balance/gait  Fall risk  Follow up Falls evaluation completed   Falls evaluation completed Falls evaluation completed   Functional Status Survey:    Vitals:   11/06/23 1115  BP: 132/68  Pulse: 86  Resp: 18  Temp: 97.6 F (36.4 C)  SpO2: 95%  Weight: 92 lb 11.2 oz (42 kg)  Height: 5' (1.524 m)   Body mass index is 18.1 kg/m. Physical Exam Constitutional:      Comments: Thin, wearing 2 LNC  Cardiovascular:     Rate and Rhythm: Normal rate.     Pulses: Normal pulses.  Pulmonary:     Effort: Pulmonary effort is normal.  Abdominal:     General: Abdomen is flat.     Palpations: Abdomen is soft.  Musculoskeletal:     Right lower leg: Edema present.     Left lower leg: Edema present.  Neurological:     Mental Status: She is alert and oriented to person, place, and time.     Labs reviewed: Recent Labs    10/17/23 1638 10/17/23 1930 10/29/23 0321 10/30/23 0429 10/31/23 0606  NA  --    < > 139 135 139  K  --    < > 3.7 3.1* 4.3  CL  --    < > 97* 92* 99  CO2  --    < > 35* 35* 34*  GLUCOSE  --    < > 151* 129* 94  BUN  --    < > 34*  36* 37*  CREATININE  --    < > 1.02* 0.93 0.83  CALCIUM  --    < > 8.1* 8.2* 8.5*  MG 2.3  --   --   --  2.8*  PHOS 3.2  --   --   --   --    < > = values in this interval not displayed.   Recent Labs    07/02/23 0000 10/17/23 1502  AST 18 30  ALT 13 39  ALKPHOS 76 70  BILITOT  --  0.9  PROT  --  6.4*  ALBUMIN 4.0 3.2*   Recent Labs    07/02/23 0000 10/17/23 1502 10/20/23 0600 10/26/23 0000 10/28/23 0500 10/29/23 0321  WBC 7.5   < > 11.3* 8.0 13.3* 9.9  NEUTROABS 6,165.00  --   --  6,304.00 10.0*  --   HGB 11.7*   < > 10.3* 11.0* 13.0 9.5*  HCT 36   < > 30.5* 35* 42.6 29.7*  MCV  --    < > 97.1  --  105.7* 101.4*  PLT 252   < > 231 260 298 187   < > = values in this interval not displayed.   Lab Results  Component Value Date   TSH 0.968 10/17/2023   Lab Results  Component Value Date   HGBA1C 5.6 11/03/2022   Lab Results  Component Value Date   CHOL 195 07/02/2023   HDL 68 07/02/2023   LDLCALC 108 07/02/2023   TRIG 101 11/03/2022    Significant Diagnostic Results in last 30 days:  ECHOCARDIOGRAM COMPLETE Result Date: 10/28/2023    ECHOCARDIOGRAM REPORT   Patient Name:   Angela Mason Date of Exam: 10/28/2023 Medical Rec #:  [id number]       Height:       60.0 in Accession #:    [id number]      Weight:       99.8 lb Date of Birth:  [birth year]  BSA:          1.389 m Patient Age:    89 years        BP:           96/64 mmHg Patient Gender: F               HR:           82 bpm. Exam Location:  [facility] Procedure: 2D Echo, Cardiac Doppler and Color Doppler (Both Spectral and Color            Flow Doppler were utilized during procedure). Indications:     Abnormal ECG R94.31  History:         Patient has prior history of Echocardiogram examinations, most                  recent 10/18/2023. COPD; Risk Factors:Hypertension. Paroxysmal                  Afib.  Sonographer:     Broadus Canes Referring Phys:  [id number] CHRISTOPHER END Diagnosing Phys: Percival Brace MD IMPRESSIONS  1.  Left ventricular ejection fraction, by estimation, is 35 to 40%. The left ventricle has moderately decreased function. The left ventricle demonstrates regional wall motion abnormalities (see scoring diagram/findings for description). Left ventricular  diastolic parameters are indeterminate.  2. Right ventricular systolic function is normal. The right ventricular size is normal.  3. Left atrial size was mildly dilated.  4. Right atrial size was moderately dilated.  5. The mitral valve is normal in structure. No evidence of mitral valve regurgitation. No evidence of mitral stenosis.  6. The aortic valve is normal in structure. Aortic valve regurgitation is not visualized. Mild aortic valve stenosis.  7. The inferior vena cava is normal in size with greater than 50% respiratory variability, suggesting right atrial pressure of 3 mmHg. FINDINGS  Left Ventricle: Left ventricular ejection fraction, by estimation, is 35 to 40%. The left ventricle has moderately decreased function. The left ventricle demonstrates regional wall motion abnormalities. Strain was performed and the global longitudinal strain is indeterminate. The left ventricular internal cavity size was normal in size. There is no left ventricular hypertrophy. Left ventricular diastolic parameters are indeterminate.  LV Wall Scoring: The mid inferoseptal segment, apical septal segment, and apex are hypokinetic. Right Ventricle: The right ventricular size is normal. No increase in right ventricular wall thickness. Right ventricular systolic function is normal. Left Atrium: Left atrial size was mildly dilated. Right Atrium: Right atrial size was moderately dilated. Pericardium: There is no evidence of pericardial effusion. Mitral Valve: The mitral valve is normal in structure. No evidence of mitral valve regurgitation. No evidence of mitral valve stenosis. Tricuspid Valve: The tricuspid valve is normal in structure. Tricuspid valve regurgitation is not  demonstrated. No evidence of tricuspid stenosis. Aortic Valve: The aortic valve is normal in structure. Aortic valve regurgitation is not visualized. Mild aortic stenosis is present. Aortic valve mean gradient measures 7.0 mmHg. Aortic valve peak gradient measures 10.9 mmHg. Aortic valve area, by VTI measures 1.24 cm. Pulmonic Valve: The pulmonic valve was normal in structure. Pulmonic valve regurgitation is not visualized. No evidence of pulmonic stenosis. Aorta: The aortic root is normal in size and structure. Venous: The inferior vena cava is normal in size with greater than 50% respiratory variability, suggesting right atrial pressure of 3 mmHg. IAS/Shunts: No atrial level shunt detected by color flow Doppler. Additional Comments: 3D was performed not requiring image post processing on an  independent workstation and was indeterminate.  LEFT VENTRICLE PLAX 2D LVIDd:         3.30 cm LVIDs:         2.70 cm LV PW:         1.10 cm LV IVS:        1.50 cm LVOT diam:     2.00 cm LV SV:         31 LV SV Index:   22 LVOT Area:     3.14 cm  RIGHT VENTRICLE RV Basal diam:  3.50 cm RV Mid diam:    2.90 cm RV S prime:     12.40 cm/s TAPSE (M-mode): 1.2 cm LEFT ATRIUM             Index        RIGHT ATRIUM           Index LA diam:        2.80 cm 2.02 cm/m   RA Area:     15.70 cm LA Vol (A2C):   22.5 ml 16.20 ml/m  RA Volume:   41.30 ml  29.73 ml/m LA Vol (A4C):   12.5 ml 9.00 ml/m LA Biplane Vol: 17.8 ml 12.81 ml/m  AORTIC VALVE AV Area (Vmax):    1.17 cm AV Area (Vmean):   1.13 cm AV Area (VTI):     1.24 cm AV Vmax:           165.33 cm/s AV Vmean:          119.667 cm/s AV VTI:            0.251 m AV Peak Grad:      10.9 mmHg AV Mean Grad:      7.0 mmHg LVOT Vmax:         61.50 cm/s LVOT Vmean:        43.200 cm/s LVOT VTI:          0.099 m LVOT/AV VTI ratio: 0.39  AORTA Ao Root diam: 2.80 cm MITRAL VALVE               TRICUSPID VALVE MV Area (PHT): 6.96 cm    TR Peak grad:   21.3 mmHg MV Decel Time: 109 msec    TR  Vmax:        231.00 cm/s MV E velocity: 87.50 cm/s                            SHUNTS                            Systemic VTI:  0.10 m                            Systemic Diam: 2.00 cm Percival Brace MD Electronically signed by Percival Brace MD Signature Date/Time: 10/28/2023/1:43:00 PM    Final    DG Chest Portable 1 View Result Date: 10/28/2023 CLINICAL DATA:  Shortness of breath. EXAM: PORTABLE CHEST 1 VIEW COMPARISON:  10/17/2023 FINDINGS: Interstitial markings are diffusely coarsened with chronic features. Interval development of interstitial and alveolar opacity in the right upper lung. Small bilateral pleural effusions suspected. The cardiopericardial silhouette is within normal limits for size. Bones are diffusely demineralized. Telemetry leads overlie the chest. IMPRESSION: 1. Interval development of interstitial and alveolar opacity in the right upper lung, suspicious for pneumonia although  asymmetric edema is also a consideration. 2. Small bilateral pleural effusions. Electronically Signed   By: Donnal Fusi M.D.   On: 10/28/2023 05:12   ECHOCARDIOGRAM COMPLETE Result Date: 10/18/2023    ECHOCARDIOGRAM REPORT   Patient Name:   Angela Mason Date of Exam: 10/18/2023 Medical Rec #:  [id number]       Height:       60.0 in Accession #:    [id number]      Weight:       95.0 lb Date of Birth:  02-06-1933       BSA:          1.360 m Patient Age:    89 years        BP:           118/83 mmHg Patient Gender: F               HR:           116 bpm. Exam Location:  [facility] Procedure: 2D Echo, Cardiac Doppler and Color Doppler (Both Spectral and Color            Flow Doppler were utilized during procedure). Indications:     CHF- Acute Diastolic I50.31  History:         Patient has no prior history of Echocardiogram examinations.  Sonographer:     Jane Meager RDCS Referring Phys:  [id number] XILIN NIU Diagnosing Phys: Debborah Fairly  Sonographer Comments: Image acquisition challenging due to respiratory motion.  IMPRESSIONS  1. Left ventricular ejection fraction, by estimation, is 45 to 50%. The left ventricle has mildly decreased function. The left ventricle demonstrates global hypokinesis. The left ventricular internal cavity size was moderately dilated. There is mild concentric left ventricular hypertrophy. Left ventricular diastolic parameters are indeterminate.  2. Right ventricular systolic function is moderately reduced. The right ventricular size is moderately enlarged. Mildly increased right ventricular wall thickness.  3. Left atrial size was severely dilated.  4. Right atrial size was severely dilated.  5. The mitral valve is grossly normal. Moderate mitral valve regurgitation.  6. Tricuspid valve regurgitation is mild to moderate.  7. The aortic valve is calcified. Aortic valve regurgitation is not visualized. Aortic valve sclerosis/calcification is present, without any evidence of aortic stenosis. FINDINGS  Left Ventricle: Left ventricular ejection fraction, by estimation, is 45 to 50%. The left ventricle has mildly decreased function. The left ventricle demonstrates global hypokinesis. Strain was performed and the global longitudinal strain is indeterminate. The left ventricular internal cavity size was moderately dilated. There is mild concentric left ventricular hypertrophy. Left ventricular diastolic parameters are indeterminate. Right Ventricle: The right ventricular size is moderately enlarged. Mildly increased right ventricular wall thickness. Right ventricular systolic function is moderately reduced. Left Atrium: Left atrial size was severely dilated. Right Atrium: Right atrial size was severely dilated. Pericardium: There is no evidence of pericardial effusion. Mitral Valve: The mitral valve is grossly normal. Moderate mitral valve regurgitation. Tricuspid Valve: The tricuspid valve is grossly normal. Tricuspid valve regurgitation is mild to moderate. Aortic Valve: The aortic valve is calcified. Aortic  valve regurgitation is not visualized. Aortic valve sclerosis/calcification is present, without any evidence of aortic stenosis. Aortic valve peak gradient measures 10.6 mmHg. Pulmonic Valve: The pulmonic valve was grossly normal. Pulmonic valve regurgitation is mild. Aorta: The aortic root, ascending aorta and aortic arch are all structurally normal, with no evidence of dilitation or obstruction. IAS/Shunts: No atrial level shunt detected by color flow Doppler. Additional Comments: 3D  was performed not requiring image post processing on an independent workstation and was indeterminate.  LEFT VENTRICLE PLAX 2D LVIDd:         3.20 cm   Diastology LVIDs:         2.40 cm   LV e' lateral: 12.40 cm/s LV PW:         1.10 cm LV IVS:        1.00 cm LVOT diam:     1.90 cm LV SV:         29 LV SV Index:   22 LVOT Area:     2.84 cm  RIGHT VENTRICLE RV Basal diam:  2.80 cm LEFT ATRIUM             Index        RIGHT ATRIUM           Index LA diam:        3.50 cm 2.57 cm/m   RA Area:     17.00 cm LA Vol (A2C):   32.7 ml 24.04 ml/m  RA Volume:   48.00 ml  35.29 ml/m LA Vol (A4C):   39.2 ml 28.82 ml/m LA Biplane Vol: 39.8 ml 29.26 ml/m  AORTIC VALVE                 PULMONIC VALVE AV Area (Vmax): 1.29 cm     PV Vmax:        0.64 m/s AV Vmax:        162.50 cm/s  PV Peak grad:   1.6 mmHg AV Peak Grad:   10.6 mmHg    RVOT Peak grad: 1 mmHg LVOT Vmax:      73.90 cm/s LVOT Vmean:     50.100 cm/s LVOT VTI:       0.104 m  AORTA Ao Root diam: 2.80 cm TRICUSPID VALVE TR Peak grad:   33.6 mmHg TR Vmax:        290.00 cm/s  SHUNTS Systemic VTI:  0.10 m Systemic Diam: 1.90 cm Debborah Fairly Electronically signed by Debborah Fairly Signature Date/Time: 10/18/2023/12:36:21 PM    Final    CT Angio Chest PE W and/or Wo Contrast Result Date: 10/17/2023 CLINICAL DATA:  Shortness of breath.  History of COPD.  Ex-smoker. EXAM: CT ANGIOGRAPHY CHEST WITH CONTRAST TECHNIQUE: Multidetector CT imaging of the chest was performed using the standard  protocol during bolus administration of intravenous contrast. Multiplanar CT image reconstructions and MIPs were obtained to evaluate the vascular anatomy. RADIATION DOSE REDUCTION: This exam was performed according to the departmental dose-optimization program which includes automated exposure control, adjustment of the mA and/or kV according to patient size and/or use of iterative reconstruction technique. CONTRAST:  60mL OMNIPAQUE IOHEXOL 350 MG/ML SOLN COMPARISON:  Plain film of earlier today.  No prior CT. FINDINGS: Cardiovascular: The quality of this exam for evaluation of pulmonary embolism is good. Although the bolus is well timed, limitations include minimal motion, EKG wire artifact and patient arm position, not raised above the head. No evidence of pulmonary embolism. Advanced aortic and branch vessel atherosclerosis. The aorta is not well opacified secondary to bolus timing. Moderate cardiomegaly, without pericardial effusion. Left main and 3 vessel coronary artery calcification. Mediastinum/Nodes: No mediastinal or hilar adenopathy. Lungs/Pleura: Small bilateral pleural effusions. Moderate centrilobular emphysema. Right middle lobe endobronchial compression/narrowing at the level of the proximal segmental bronchi including on 68/4. This results in right middle lobe collapse. No well-defined obstructive mass. Biapical pleuroparenchymal scarring. Left greater than right  base dependent compressive atelectasis. Upper Abdomen: Reflux of contrast into the hepatic veins suggests elevated right heart pressures. Artifact degradation continuing into the upper abdomen. No gross acute finding. Musculoskeletal: Lower thoracic spondylosis with S-shaped thoracolumbar spine curvature. Review of the MIP images confirms the above findings. IMPRESSION: 1.  No evidence of pulmonary embolism. 2. Mild multifocal degradation as detailed above. 3. Small bilateral pleural effusions. Findings of elevated right heart pressures.  Suspect a component of fluid overload. 4. Right middle lobe endobronchial obstruction or compression at the level of the proximal subsegmental bronchi resulting in right middle lobe collapse. No well-defined obstructive mass. Consider further evaluation with bronchoscopy with attention to the right middle lobe bronchus. Alternatively, especially given patient age and comorbidities, CT follow-up at 6 months could be performed. 5. Aortic atherosclerosis (ICD10-I70.0), coronary artery atherosclerosis and emphysema (ICD10-J43.9). Electronically Signed   By: Lore Rode M.D.   On: 10/17/2023 18:40   DG Chest 2 View Result Date: 10/17/2023 CLINICAL DATA:  Shortness of breath EXAM: CHEST - 2 VIEW COMPARISON:  Chest radiograph dated 06/26/2022 FINDINGS: Normal lung volumes. No focal consolidations. Trace blunting of bilateral costophrenic angles. No pneumothorax. Similar mildly enlarged cardiomediastinal silhouette. No acute osseous abnormality. Unchanged compression deformity of T4. IMPRESSION: 1. Small bilateral pleural effusions. 2. Similar mild cardiomegaly. Electronically Signed   By: Limin  Xu M.D.   On: 10/17/2023 15:23    Assessment/Plan Heart Failure Currently managed with [place]. Lasix was discontinued due to hypokalemia. Heart failure may be related to atrial fibrillation and not a long-term issue. Blood pressure variability, with some hypotensive readings, likely due to diuretics. Discontinuation of Lasix is expected to stabilize blood pressure and potassium levels. - Continue Farxiga - Monitor blood pressure regularly - Recheck labs in three weeks - Ensure potassium levels are stable  Hypertension Blood pressure readings have been variable, with some hypotensive episodes potentially due to diuretics. Current antihypertensive regimen is being adjusted, and she is no longer on Lasix. - Monitor blood pressure regularly - Adjust antihypertensive medications as needed  Chronic Obstructive  Pulmonary Disease (COPD) On oxygen therapy with decreased requirements from two liters to one liter. Pulmonologist plans further pulmonary function tests and follow-up in three weeks. Goal is to wean off oxygen, potentially within one to two weeks, except possibly at night. Improved awareness of breathing techniques. - Continue oxygen therapy as needed - Follow up with pulmonologist in three weeks - Wean off oxygen therapy over the next one to two weeks  Fluid Management Advised to maintain a fluid intake of around one liter per day, considering her size and cardiac condition. Current intake is below this recommendation, but no changes are needed as she is not at risk of fluid overload. - Maintain fluid intake around one liter per day - Monitor for signs of fluid overload  Physical Therapy Undergoing physical therapy with variable performance due to fatigue. Goal is to improve mobility and reduce oxygen dependency. Therapy is expected to continue until she is off oxygen, potentially in the next one to two weeks. - Continue physical therapy five days a week - Focus on increasing mobility and reducing oxygen dependency  Nutritional Management Taking Boost nutritional supplement but experiences fullness and bloating. Suggested to take it only when necessary. Weight is well-maintained, with weekly monitoring. - Take Boost supplement as needed - Monitor weight regularly  Compression Stockings Requires custom compression stockings as standard sizes are inadequate. Custom stockings at home provide better fit and compression. Current stockings are insufficient and  cause wrinkling. - Use custom compression stockings from home - Ensure proper fit and compression  Follow-up Scheduled follow-up appointments and lab checks to monitor conditions and adjust treatments. Pulmonologist follow-up in three weeks, cardiologist follow-up on May 8th. - Follow up with pulmonologist in three weeks - Recheck labs  in three weeks - Follow up with cardiologist on May 8th  Family/ staff Communication: Daughter, nursing  Labs/tests ordered:  CBC, CMP in 2 weeks

## 2023-11-07 ENCOUNTER — Encounter: Payer: Self-pay | Admitting: Student

## 2023-11-09 ENCOUNTER — Other Ambulatory Visit: Payer: Self-pay | Admitting: Student

## 2023-11-09 DIAGNOSIS — J441 Chronic obstructive pulmonary disease with (acute) exacerbation: Secondary | ICD-10-CM

## 2023-11-09 DIAGNOSIS — I509 Heart failure, unspecified: Secondary | ICD-10-CM

## 2023-11-09 DIAGNOSIS — I480 Paroxysmal atrial fibrillation: Secondary | ICD-10-CM

## 2023-11-09 NOTE — Progress Notes (Signed)
 Spoke with patient's daughter and HCPOA who manages patients' financial affairs. Patient with numerous new medications and diagnoses-- A. Fib, CHF, COPD medications and has traditional medicare. At this time trying to enroll patient into part D, however, this would change her to an advantage plan. Is this the only option? They would like to stay with original medicare. Also, If she stays on current plan, what will the cost be? She was quoted 1100 per year ($80 per month) which is less than the $2,000 maximum.   Consult to pharmacy to determine best options.

## 2023-11-10 ENCOUNTER — Telehealth: Payer: Self-pay | Admitting: Pharmacist

## 2023-11-10 NOTE — Progress Notes (Signed)
   11/10/2023  [name], female   DOB: 12/17/1932, 89 y.o.   MRN: [id number]  Received the following message from Dr. Jann Melody:  Patient with numerous new medications and diagnoses-- A. Fib, CHF, COPD medications and has traditional medicare. At this time trying to enroll patient into part D, however, this would change her to an advantage plan. Is this the only option? They would like to stay with original medicare. Also, If she stays on current plan, what will the cost be? She was quoted 1100 per year ($80 per month) which is less than the $2,000 maximum.    Called Patient at the number listed in her chart which I think is her daughter's number.  Unfortunately, no one answered and the voicemail was not set up so I could not leave a message.  I called twice within a twenty minute time frame and will attempt to call them again later.   Called [facility] as they are the last Pharmacy on record to bill a claim for the Patient. The representative from [facility] said they have been billing Medicare Part A and billing the facility for the Patient's medications.    If the Patient truly did not enroll in Medicare Part D when she was [place] eligible, she may be hit with a penalty of a 1% extra premium for every month she was eligible but did not enroll---essentially 12% per year.  She can avoid this penalty if she had credibale drug coverage (which I do not think she does) or if she qualifies for LIS/Extra Help.   She could apply for "reconsideration" with her chosen drug plan.  Applying for reconsideration must take place with 60 days of being informed of owing the late enrollment penalty.  Unfortunately the late enrollment penalty is part of the premium by law.  Plan: Route note to Provider. Call Patient back--although she is currently in the [facility] and the number listed is her daughter's who lives in the [place].  Angela Mason, PharmD, BCACP Clinical  Pharmacist [phone]

## 2023-11-11 ENCOUNTER — Encounter: Payer: Self-pay | Admitting: Student

## 2023-11-26 ENCOUNTER — Telehealth: Payer: Self-pay | Admitting: Pharmacist

## 2023-11-26 DIAGNOSIS — Z79899 Other long term (current) drug therapy: Secondary | ICD-10-CM

## 2023-11-26 NOTE — Progress Notes (Signed)
   11/26/2023  Patient ID: Annia Barth, female   DOB: 06/01/1933, 89 y.o.   MRN: [id number]  Called the number listed as the Patient's daughter Gurney Lefort). Unfortunately,the number listed in the EMR was not operational.  Called the other number which was actually the Patient's emergency contact, [name], she confirmed the Patient has never signed up for Medicare Part D. She as provided with the website for [facility] as well as to [employer] to explain the penalty for not signing up for Medicare Part D when eligible (at 50).  Ms. Everlyn Hockey said she would get in touch with the Patient's daughter and call me back.  She said she felt like the Patient's daughter had already signed the Patient up.  Although the enrollment period for Medicare Part D has passed, the Patient may qualify for "special circumstances" since she may be moving out of the nursing home.  Plan: Follow up in 1 week if I have not heard back from Ms. Cumbee.   Geronimo Krabbe, PharmD, BCACP Clinical Pharmacist [phone]

## 2023-12-02 ENCOUNTER — Non-Acute Institutional Stay (SKILLED_NURSING_FACILITY): Payer: Self-pay | Admitting: Student

## 2023-12-02 DIAGNOSIS — J9611 Chronic respiratory failure with hypoxia: Secondary | ICD-10-CM

## 2023-12-02 DIAGNOSIS — J449 Chronic obstructive pulmonary disease, unspecified: Secondary | ICD-10-CM

## 2023-12-02 DIAGNOSIS — I480 Paroxysmal atrial fibrillation: Secondary | ICD-10-CM

## 2023-12-02 DIAGNOSIS — R636 Underweight: Secondary | ICD-10-CM

## 2023-12-02 DIAGNOSIS — E43 Unspecified severe protein-calorie malnutrition: Secondary | ICD-10-CM

## 2023-12-02 DIAGNOSIS — F323 Major depressive disorder, single episode, severe with psychotic features: Secondary | ICD-10-CM

## 2023-12-02 DIAGNOSIS — K5901 Slow transit constipation: Secondary | ICD-10-CM

## 2023-12-02 DIAGNOSIS — I10 Essential (primary) hypertension: Secondary | ICD-10-CM | POA: Diagnosis not present

## 2023-12-03 ENCOUNTER — Encounter: Payer: Self-pay | Admitting: Pulmonary Disease

## 2023-12-03 ENCOUNTER — Ambulatory Visit (INDEPENDENT_AMBULATORY_CARE_PROVIDER_SITE_OTHER): Admitting: Pulmonary Disease

## 2023-12-03 ENCOUNTER — Ambulatory Visit
Admission: RE | Admit: 2023-12-03 | Discharge: 2023-12-03 | Disposition: A | Discharge status: Home / Self Care | Attending: Pulmonary Disease | Admitting: Pulmonary Disease

## 2023-12-03 ENCOUNTER — Telehealth: Payer: Self-pay | Admitting: Pharmacist

## 2023-12-03 ENCOUNTER — Ambulatory Visit
Admission: RE | Admit: 2023-12-03 | Discharge: 2023-12-03 | Disposition: A | Discharge status: Home / Self Care | Source: Ambulatory Visit | Attending: Pulmonary Disease | Admitting: Pulmonary Disease

## 2023-12-03 ENCOUNTER — Encounter: Payer: Self-pay | Admitting: Student

## 2023-12-03 VITALS — BP 108/62 | HR 68 | Temp 97.6°F | Ht 60.0 in | Wt 88.6 lb

## 2023-12-03 DIAGNOSIS — Z7722 Contact with and (suspected) exposure to environmental tobacco smoke (acute) (chronic): Secondary | ICD-10-CM

## 2023-12-03 DIAGNOSIS — Z79899 Other long term (current) drug therapy: Secondary | ICD-10-CM

## 2023-12-03 DIAGNOSIS — J9602 Acute respiratory failure with hypercapnia: Secondary | ICD-10-CM

## 2023-12-03 DIAGNOSIS — J9601 Acute respiratory failure with hypoxia: Secondary | ICD-10-CM

## 2023-12-03 DIAGNOSIS — J449 Chronic obstructive pulmonary disease, unspecified: Secondary | ICD-10-CM

## 2023-12-03 DIAGNOSIS — Z87891 Personal history of nicotine dependence: Secondary | ICD-10-CM

## 2023-12-03 DIAGNOSIS — I5020 Unspecified systolic (congestive) heart failure: Secondary | ICD-10-CM

## 2023-12-03 DIAGNOSIS — I501 Left ventricular failure: Secondary | ICD-10-CM

## 2023-12-03 DIAGNOSIS — I480 Paroxysmal atrial fibrillation: Secondary | ICD-10-CM

## 2023-12-03 DIAGNOSIS — R54 Age-related physical debility: Secondary | ICD-10-CM

## 2023-12-03 NOTE — Progress Notes (Signed)
 Subjective:    [name], female    DOB: [birth year], 89 y.o.   MRN: [id number]  Patient Care Team: Valrie Gehrig, MD as PCP - General [facility])  Chief Complaint  Patient presents with   Follow-up    No cough or wheezing.     BACKGROUND/INTERVAL:89 y.o. former smoker with medical history significant of HTN, HLD, PAF on Eliquis , COPD, hypothyroidism, admitted to [facility] between 28 October 2023 and 31 October 2023 for atrial fibrillation with RVR.  She also had decompensation of systolic heart failure.  Elevated troponins at that time, B-natriuretic peptide 1228 consistent with CHF, echocardiogram showing LVEF of 35 to 40% a significant decline from her echocardiogram of 23 March.  Presents for evaluation of pulmonary function.  History of COPD with FEV1 unknown and no prior need for inhalers until recent admissions to [facility].  Was discharged on oxygen at 2 L/min due to acute respiratory failure with hypoxia and hypercapnia.  She was initially seen here on 06 November 2023.  HPI Discussed the use of AI scribe software for clinical note transcription with the patient, who gave verbal consent to proceed.  History of Present Illness   Angela Mason is an 89 year old female with presumed COPD, chronic systolic heart failure, and chronic respiratory failure with hypoxia who presents for follow-up. She is accompanied by her daughter.  Her breathing is generally stable and has improved since her last hospital stay, with a decrease in oxygen requirements. She continues to use her inhaler with a spacer, which aids in managing her symptoms. No chest pain is reported, and she occasionally has phlegm in the back of her throat, which she can bring up easily.  Lower extremity swelling remains about the same, though her daughter notes slight improvement, possibly due to more consistent use of compression socks. She confirms wearing the socks regularly, except for one day when she  forgot.  She recently had a cardiology appointment, and her daughter mentions that the cardiologist's notes were generally positive, with some medication adjustments made.      Review of Systems A 10 point review of systems was performed and it is as noted above otherwise negative.   Patient Active Problem List   Diagnosis Date Noted   Afib (HCC) 10/28/2023   Myocardial injury 10/17/2023   Acute paranoid reaction (HCC) 09/12/2022   Constipation by delayed colonic transit 08/06/2022   Pedal edema 08/06/2022   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) 05/12/2022   HLD (hyperlipidemia) 04/30/2022   Hypothyroidism 04/30/2022   Other symptoms and signs involving cognitive functions and awareness 03/03/2022   History of falling 02/01/2022   Muscle weakness (generalized) 02/01/2022   Need for assistance with personal care 02/01/2022   Other abnormalities of gait and mobility 02/01/2022   Other specified fracture of left pubis, subsequent encounter for fracture with routine healing 02/01/2022   Personal history of other malignant neoplasm of skin 02/01/2022   Protein-calorie malnutrition, severe 01/29/2022   Closed fracture of superior ramus of left pubis (HCC) 01/27/2022   Leukocytosis 01/27/2022   COPD (chronic obstructive pulmonary disease) (HCC) 01/27/2022   MDD (major depressive disorder), recurrent episode, moderate (HCC) 07/31/2021   Insomnia 07/31/2021   History of delirium 03/05/2021   Unspecified macular degeneration 10/16/2020   Severe major depression, single episode, with psychotic features (HCC) 10/12/2020   UTI (urinary tract infection) 10/04/2020   HTN (hypertension)    Hyponatremia    Underweight  08/08/2020   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 08/04/2018   Cystocele, midline 05/05/2017   Nocturia 10/07/2016   Unstable bladder 10/07/2016   Vaginal atrophy 01/23/2016   Procidentia  of uterus 11/01/2015    Social History   Tobacco Use   Smoking status: Former    Current packs/day: 0.00    Types: Cigarettes    Quit date: 09/05/2005    Years since quitting: 18.2   Smokeless tobacco: Never  Substance Use Topics   Alcohol  use: Not Currently    Allergies  Allergen Reactions   Gentamicin    Levofloxacin Diarrhea    Current Meds  Medication Sig   albuterol  (VENTOLIN  HFA) 108 (90 Base) MCG/ACT inhaler Inhale 2 puffs into the lungs every 4 (four) hours as needed for wheezing or shortness of breath.   ARIPiprazole  (ABILIFY ) 2 MG tablet Take 1 tablet (2 mg total) by mouth daily with breakfast.   mometasone -formoterol  (DULERA ) 200-5 MCG/ACT AERO Inhale 2 puffs into the lungs 2 (two) times daily.   Multiple Vitamins-Minerals (PRESERVISION AREDS) TABS Take 1 capsule by mouth 2 (two) times daily.   Omega-3 Fatty Acids (FISH OIL CONCENTRATE PO) Take 1 tablet by mouth daily.    OXYGEN 2 lpm for dyspnea or SOB   potassium chloride  (KLOR-CON  M) 10 MEQ tablet Take by mouth.   pravastatin  (PRAVACHOL ) 20 MG tablet Take 1 tablet (20 mg total) by mouth daily at 6 PM.    Immunization History  Administered Date(s) Administered   Influenza, High Dose Seasonal PF 05/21/2017, 05/07/2022   Influenza-Unspecified 05/09/2021, 05/15/2023   PFIZER SARS-COV-2 Pediatric Vaccination 5-37yrs 08/09/2019, 09/06/2019, 06/12/2020   PNEUMOCOCCAL CONJUGATE-20 02/11/2022   PPD Test 10/16/2020, 10/26/2020, 02/01/2022, 02/10/2022   Pneumococcal Conjugate-13 08/08/2018   Zoster Recombinant(Shingrix) 02/05/2023, 06/10/2023        Objective:     BP 108/62 (BP Location: Left Arm, Patient Position: Sitting, Cuff Size: Normal)   Pulse 68   Temp 97.6 F (36.4 C) (Temporal)   Ht 5' (1.524 m)   Wt 88 lb 9.6 oz (40.2 kg)   SpO2 92%   BMI 17.30 kg/m   SpO2: 92 % O2 Device: Nasal cannula O2 Flow Rate (L/min): 0.5 L/min O2 Type: Continuous O2  GENERAL: Very frail appearing elderly woman, slow  to interact.  Resents in transport chair.  No respiratory distress on supplemental oxygen. HEAD: Normocephalic, atraumatic.  EYES: Pupils equal, round, reactive to light.  No scleral icterus.  MOUTH: Poor dentition, oral mucosa moist.  No thrush NECK: Supple. No thyromegaly. Trachea midline. No JVD.  No adenopathy. PULMONARY: Good air entry bilaterally.  No adventitious sounds. CARDIOVASCULAR: S1 and S2. Regular rate and rhythm with occasional extrasystoles.  No rubs murmurs gallops heard. ABDOMEN: Benign. MUSCULOSKELETAL: No joint deformity, no clubbing, 2+ edema of the lower extremities wearing compression stockings.  NEUROLOGIC: No overt focal deficit.  Mild psychomotor retardation. SKIN: Intact,warm,dry. PSYCH: Slow to interact but appropriate when doing so.    Assessment & Plan:     ICD-10-CM   1. COPD suggested by initial evaluation (HCC)  J44.9     2. Acute respiratory failure with hypoxia and hypercapnia (HCC)  J96.01    J96.02     3. Systolic CHF with reduced left ventricular function, NYHA class 3 (HCC)  I50.20 DG Chest 2 View    4. Pulmonary edema cardiac cause (HCC)  I50.1 DG Chest 2 View    5. Paroxysmal atrial fibrillation (HCC)  I48.0     6.  Frailty  R54       Orders Placed This Encounter  Procedures   DG Chest 2 View    Standing Status:   Future    Expiration Date:   12/02/2024    Reason for Exam (SYMPTOM  OR DIAGNOSIS REQUIRED):   Pulmonary edema, shortness of breath    Preferred imaging location?:   [facility]   Discussion:    Chronic respiratory failure with hypoxia Chronic respiratory failure with hypoxia is improving with decreasing oxygen requirements. Continued use of oxygen therapy is anticipated, with potential reduction to nighttime use only if progress continues. Encouraging progress with physical therapy. - Continue current oxygen therapy regimen. - Monitor and adjust oxygen requirements as needed. - Consider reducing oxygen to nighttime  use if progress continues.  Presumed COPD Presumed COPD is well-managed. No wheezing on examination. Inhaler with spacer is effective. - Continue Dulera  inhaler with spacer.  Chronic systolic heart failure Chronic systolic heart failure is well-managed. Swelling has slightly improved with compression socks. Recent cardiology appointment was positive with some medication adjustments. - Continue wearing compression socks. - Review cardiology notes for medication adjustments. - Last chest x-ray showing significant pulmonary edema, will recheck  Follow-up Follow-up includes imaging and lab work to monitor progress. - Order chest x-ray to assess for residual fluid. - Schedule chest x-ray at outpatient imaging in [place]. - Coordinate lab work at [facility].       Advised if symptoms do not improve or worsen, to please contact office for sooner follow up or seek emergency care.    I spent 40 minutes of dedicated to the care of this patient on the date of this encounter to include pre-visit review of records, face-to-face time with the patient discussing conditions above, post visit ordering of testing, clinical documentation with the electronic health record, making appropriate referrals as documented, and communicating necessary findings to members of the patients care team.     C. Chloe Counter, MD Advanced Bronchoscopy PCCM [facility]    *This note was generated using voice recognition software/Dragon and/or AI transcription program.  Despite best efforts to proofread, errors can occur which can change the meaning. Any transcriptional errors that result from this process are unintentional and may not be fully corrected at the time of dictation.

## 2023-12-03 NOTE — Patient Instructions (Signed)
 VISIT SUMMARY:  Today, we reviewed your chronic respiratory failure, presumed COPD, and chronic systolic heart failure. Your breathing has improved, and your oxygen needs have decreased. Your swelling has slightly improved with the use of compression socks. We also discussed your recent cardiology appointment, which had positive feedback.  YOUR PLAN:  -SUBACUTE RESPIRATORY FAILURE WITH HYPOXIA: Subacute respiratory failure with hypoxia means your lungs are not getting enough oxygen into your blood. Your condition is improving, and we will continue your current oxygen therapy. If your progress continues, we may reduce your oxygen use to nighttime only. You have made tremendous progress in this regard. Keep up the good work with physical therapy.  -PRESUMED COPD: COPD is a lung disease that makes it hard to breathe. Your COPD is well-managed with your current inhaler. Continue using your Dulera  inhaler with the spacer as prescribed.  -CHRONIC SYSTOLIC HEART FAILURE: Chronic systolic heart failure means your heart is not pumping blood as well as it should. Your condition is stable, and the swelling in your legs has slightly improved with the use of compression socks. Continue wearing your compression socks and follow any medication adjustments from your cardiologist.  INSTRUCTIONS:  Please schedule a chest x-ray at the outpatient imaging center in [place] and coordinate lab work at [place] to monitor your progress.

## 2023-12-03 NOTE — Progress Notes (Signed)
   12/03/2023  [name], female   DOB: 05-27-1933, 89 y.o.   MRN: [id number]  Received a voicemail from the Patient's daughter, Gurney Lefort requesting help with getting the Patient enrolled in Medicare PartD.  She did not leave a number where I could call her back and she lives in the [place]. From her message she would be in the states for a few days but the best method to reach her would be email.  I called the [facility] office to inquire about what would be the best option to get the Patient enrolled.  The representative from [facility] said the Patient would not be eligible to enroll until May 11, 2024.  He also said there would be no way to get out of the penalty except if the Patient qualifies for LIS or Medicaid.  A LIS application can be completed online with a request for information to be sent to the state which would assess the patient for [facility] Medicaid as well.  If she were to qualify LIS or Medicaid programs would cover the penalty costs.  Plan:  Await a call back from the Patient's daughter. When the Patient's daughter calls me back, give her the number to [facility] as they can help the patient get enrolled and pick a plan during enrollment time. They suggest establishing contact in September.  [phone]

## 2023-12-03 NOTE — Progress Notes (Signed)
 Location:  Other Nursing Home Room Number: [facility] 104A Place of Service:  SNF [phone]) Provider:  Alver Jobs, Lisa Rideau, MD  Patient Care Team: Valrie Gehrig, MD as PCP - General [facility])  Extended Emergency Contact Information Primary Emergency Contact: Cumbee,Carolyn Address: [address] (neighbor - 100 yards away)          [place], [place] [address] [place] Phone: [phone] Relation: Friend Secondary Emergency Contact: Obey,[name] Address: Note:  my last name is OBEY, not Champ Coma Home Phone: [phone] Relation: Daughter  Code Status:  DNR Goals of care: Advanced Directive information    10/28/2023   11:17 AM  Advanced Directives  Does Patient Have a Medical Advance Directive? No  Type of Estate agent of [place];Living will  Does patient want to make changes to medical advance directive? No - Patient declined  Copy of Healthcare Power of Attorney in Chart? No - copy requested  Would patient like information on creating a medical advance directive? No - Patient declined     Chief Complaint  Patient presents with   Medical Management of Chronic Issues    HPI:  Pt is a 89 y.o. female seen today for a routine visit.  History of Present Illness The patient presents for follow-up regarding her respiratory status and weight management.  She continues to use supplemental oxygen and is unsure if her oxygen levels have reached the goal of 88% on room air. She describes her breathing as 'okay'.  She is experiencing constipation, with her last bowel movement occurring two days ago. She is uncertain about her current use of Miralax, as it is given to her without clear labeling, making it difficult to know what she is consuming.  There is a concern about weight loss, as she has lost two pounds over the past month, going from 92 to 90 pounds. She mentions consuming a polyprotein and eating ice cream  once a day but is not consuming protein shakes. She is open to trying Ensure juice as an alternative. She reports eating well but has not had a bowel movement in two days. She is unsure about her current medication intake, specifically Miralax.  Past Medical History:  Diagnosis Date   Cancer (HCC)    SKIN   COPD, mild (HCC)    Dyspnea    Edema    Heart failure with mildly reduced ejection fraction (HFmrEF) (HCC)    HLD (hyperlipidemia)    HTN (hypertension)    Hypothyroidism    Macular degeneration, bilateral    Paroxysmal atrial fibrillation (HCC)    Varicosities    Past Surgical History:  Procedure Laterality Date   CATARACT EXTRACTION W/PHACO Right 02/03/2017   Procedure: CATARACT EXTRACTION PHACO AND INTRAOCULAR LENS PLACEMENT (IOC);  Surgeon: Clair Crews, MD;  Location: [facility] ORS;  Service: Ophthalmology;  Laterality: Right;  US  00:57.2 AP% 18.5 CDE 10.58 Fluid pack lot # [id number]   CATARACT EXTRACTION W/PHACO Left 02/24/2017   Procedure: CATARACT EXTRACTION PHACO AND INTRAOCULAR LENS PLACEMENT (IOC);  Surgeon: Clair Crews, MD;  Location: [facility] ORS;  Service: Ophthalmology;  Laterality: Left;  US  01:12 AP% 25.0 CDE 18.03 Fluid pack lot # [id number]   MOHS SURGERY  2015   nose   SALIVARY GLAND SURGERY Left 2012   TONSILLECTOMY  1940    Allergies  Allergen Reactions   Gentamicin    Levofloxacin Diarrhea    Outpatient Encounter Medications as of 12/02/2023  Medication Sig  acetaminophen  (TYLENOL ) 325 MG tablet Take 650 mg by mouth every 4 (four) hours as needed.   amiodarone  (PACERONE ) 200 MG tablet Take 2 tablets (400 mg total) by mouth 2 (two) times daily for 5 days, THEN 1 tablet (200 mg total) daily.   apixaban  (ELIQUIS ) 2.5 MG TABS tablet Take 1 tablet (2.5 mg total) by mouth 2 (two) times daily.   ARIPiprazole  (ABILIFY ) 2 MG tablet Take 1 tablet (2 mg total) by mouth daily with breakfast.   dapagliflozin  propanediol (FARXIGA ) 10 MG TABS tablet Take 1 tablet  (10 mg total) by mouth daily.   estradiol  (ESTRACE ) 0.1 MG/GM vaginal cream Place 1 Applicatorful vaginally 2 (two) times a week.   furosemide  (LASIX ) 20 MG tablet Take 1 tablet (20 mg total) by mouth daily.   mometasone -formoterol  (DULERA ) 200-5 MCG/ACT AERO Inhale 2 puffs into the lungs 2 (two) times daily.   Multiple Vitamins-Minerals (PRESERVISION AREDS) TABS Take 1 capsule by mouth 2 (two) times daily.   nystatin  (MYCOSTATIN /NYSTOP ) powder Apply 1 Application topically 2 (two) times daily.   Omega-3 Fatty Acids (FISH OIL CONCENTRATE PO) Take 1 tablet by mouth daily.    OXYGEN 2 lpm for dyspnea or SOB   polyethylene glycol powder (GLYCOLAX/MIRALAX) 17 GM/SCOOP powder Take by mouth once. 0.5 scoop one time a day every Mon, Wed, Fri   Polyvinyl Alcohol -Povidone PF (REFRESH) 1.4-0.6 % SOLN Place 1 drop into both eyes in the morning and at bedtime.   potassium chloride  (KLOR-CON  M) 10 MEQ tablet Take by mouth.   pravastatin  (PRAVACHOL ) 20 MG tablet Take 1 tablet (20 mg total) by mouth daily at 6 PM.   No facility-administered encounter medications on file as of 12/02/2023.    Review of Systems  Immunization History  Administered Date(s) Administered   Influenza, High Dose Seasonal PF 05/21/2017, 05/07/2022   Influenza-Unspecified 05/09/2021, 05/15/2023   PFIZER SARS-COV-2 Pediatric Vaccination 5-68yrs 08/09/2019, 09/06/2019, 06/12/2020   PNEUMOCOCCAL CONJUGATE-20 02/11/2022   PPD Test 10/16/2020, 10/26/2020, 02/01/2022, 02/10/2022   Pneumococcal Conjugate-13 08/08/2018   Zoster Recombinant(Shingrix) 02/05/2023, 06/10/2023   Pertinent  Health Maintenance Due  Topic Date Due   DEXA SCAN  Never done   INFLUENZA VACCINE  02/26/2024      09/19/2022    2:02 PM 11/14/2022    1:17 PM 01/14/2023    1:48 PM 01/30/2023    3:20 PM 03/03/2023    3:50 PM  Fall Risk  Falls in the past year? 0 0 0 0 0  Was there an injury with Fall? 0 0 0 0 0  Fall Risk Category Calculator 0 0 0 0 0  Patient at  Risk for Falls Due to No Fall Risks   No Fall Risks Impaired balance/gait  Fall risk Follow up Falls evaluation completed   Falls evaluation completed Falls evaluation completed   Functional Status Survey:    There were no vitals filed for this visit. There is no height or weight on file to calculate BMI. Physical Exam Constitutional:      Comments: Alert, Thin, Frail, on [place]  Cardiovascular:     Rate and Rhythm: Normal rate.     Pulses: Normal pulses.  Pulmonary:     Effort: Pulmonary effort is normal.  Abdominal:     General: Abdomen is flat.     Palpations: Abdomen is soft.  Skin:    General: Skin is warm and dry.  Neurological:     Mental Status: She is oriented to person, place, and time.  Labs reviewed: Recent Labs    10/17/23 1638 10/17/23 1930 10/29/23 0321 10/30/23 0429 10/31/23 0606  NA  --    < > 139 135 139  K  --    < > 3.7 3.1* 4.3  CL  --    < > 97* 92* 99  CO2  --    < > 35* 35* 34*  GLUCOSE  --    < > 151* 129* 94  BUN  --    < > 34* 36* 37*  CREATININE  --    < > 1.02* 0.93 0.83  CALCIUM   --    < > 8.1* 8.2* 8.5*  MG 2.3  --   --   --  2.8*  PHOS 3.2  --   --   --   --    < > = values in this interval not displayed.   Recent Labs    07/02/23 0000 10/17/23 1502  AST 18 30  ALT 13 39  ALKPHOS 76 70  BILITOT  --  0.9  PROT  --  6.4*  ALBUMIN 4.0 3.2*   Recent Labs    07/02/23 0000 10/17/23 1502 10/20/23 0600 10/26/23 0000 10/28/23 0500 10/29/23 0321  WBC 7.5   < > 11.3* 8.0 13.3* 9.9  NEUTROABS 6,165.00  --   --  6,304.00 10.0*  --   HGB 11.7*   < > 10.3* 11.0* 13.0 9.5*  HCT 36   < > 30.5* 35* 42.6 29.7*  MCV  --    < > 97.1  --  105.7* 101.4*  PLT 252   < > 231 260 298 187   < > = values in this interval not displayed.   Lab Results  Component Value Date   TSH 0.968 10/17/2023   Lab Results  Component Value Date   HGBA1C 5.6 11/03/2022   Lab Results  Component Value Date   CHOL 195 07/02/2023   HDL 68 07/02/2023    LDLCALC 108 07/02/2023   TRIG 101 11/03/2022    Significant Diagnostic Results in last 30 days:  No results found.  Assessment/Plan Atrial Fibrillation, Paroxysmal Tolerating new medications well. No signs of bleeding. Rate controlled  CHF Appears Euvolemic on exam at this time.   Weight loss Chronic weight loss with a recent decrease of 2 pounds over the past month, significant given the current weight of 90 pounds. She consumes protein in the form of ice cream once daily but is not taking protein shakes. She is open to trying Ensure juice as an alternative protein supplement. - Encourage consumption of protein ice cream twice daily if possible. - Introduce Ensure juice as an alternative protein supplement.  Constipation Constipation with the last bowel movement occurring two days ago. Uncertainty about Miralax administration due to lack of labeling on the medication container, leading to confusion about its intake. - Ensure proper labeling of Miralax to confirm administration. - Discuss with nursing staff to ensure Miralax is given with water.  COPD with Oxygen dependence Continued oxygen dependence with a goal of maintaining oxygen saturation at 88% on room air. Breathing is adequate, but there is an ongoing need for supplemental oxygen. Physical therapy is progressing well, potentially improving respiratory function over time. - Monitor oxygen saturation levels to assess the need for continued oxygen therapy.  Severe Depression Currently stable with regular use of abilify    Severe protein deficiency  Severely underweight, protein supplement as above.  Family/ staff Communication: Nursing, Daughter  Labs/tests  ordered:  none

## 2023-12-07 ENCOUNTER — Telehealth: Payer: Self-pay | Admitting: Pharmacist

## 2023-12-07 DIAGNOSIS — Z79899 Other long term (current) drug therapy: Secondary | ICD-10-CM

## 2023-12-07 NOTE — Progress Notes (Signed)
   12/07/2023  [name], female   DOB: 1933/07/11, 89 y.o.   MRN: [id number]  The following email was sent to the Patient's daughter:  Hello.  I received your message about your mother.  Here is the information I have gleaned:  I called the [facility] SHIIP office to inquire about what would be the best option to get the Patient enrolled.  The representative from [facility] said the Patient would not be eligible to enroll until May 11, 2024.  He also said there would be no way to get out of the penalty except if the Patient qualifies for LIS or Medicaid.    A LIS application can be completed online with a request for information to be sent to the state which would assess the patient for [facility] Medicaid as well.  If she were to qualify LIS or Medicaid programs would cover the penalty costs.  I can help with doing an evaluation for  the LIS/Extra Help Program but cannot complete the enrollment.  The representative from [facility] said  they can help the patient get enrolled and pick a plan during enrollment time ( Oct 15-Dec. They suggested establishing contact in September.  [phone]   Here are some links to help with understanding:   Joining a plan  Medicare [email or web address]   Information partners can use on: The Part D late enrollment Penalty. http://www.smith-[email or web address]   Geronimo Krabbe, PharmD, BCACP Clinical Pharmacist [phone]

## 2023-12-08 ENCOUNTER — Telehealth: Payer: Self-pay | Admitting: Pharmacist

## 2023-12-08 ENCOUNTER — Ambulatory Visit: Admitting: Obstetrics & Gynecology

## 2023-12-08 DIAGNOSIS — Z79899 Other long term (current) drug therapy: Secondary | ICD-10-CM

## 2023-12-08 NOTE — Progress Notes (Signed)
   12/08/2023  [name], female   DOB: 12-May-1933, 89 y.o.   MRN: [id number]  Called Patient's emergency contact, [name] in an attempt to reach the Patient's daughter. The email I sent to the email address she left for me bounced back.  Plan; Follow up in 5-7 business days.   Geronimo Krabbe, PharmD, [facility] Clinical Pharmacist [facility] [phone]

## 2023-12-09 ENCOUNTER — Ambulatory Visit (INDEPENDENT_AMBULATORY_CARE_PROVIDER_SITE_OTHER): Admitting: Obstetrics & Gynecology

## 2023-12-09 VITALS — BP 122/50 | HR 67 | Wt 93.0 lb

## 2023-12-09 DIAGNOSIS — Z4689 Encounter for fitting and adjustment of other specified devices: Secondary | ICD-10-CM

## 2023-12-09 DIAGNOSIS — N765 Ulceration of vagina: Secondary | ICD-10-CM

## 2023-12-09 DIAGNOSIS — N813 Complete uterovaginal prolapse: Secondary | ICD-10-CM | POA: Diagnosis not present

## 2023-12-09 NOTE — Progress Notes (Signed)
    GYNECOLOGY PROGRESS NOTE  Subjective:    [name], female    DOB: 09/23/1932, 89 y.o.   MRN: [id number]  HPI  Patient is a 89 y.o. G1P1001 here for pessary maintenance. She last saw Dr. Denman Fischer for this issue 05/2023. She denies spotting currently. She denies urinary incontinence or retention and reports normal bowel function. She has been using a pessary for more than a decade (She is unable to tell me a more exact date). She has uterine prolapse with cystocele and rectocele.She uses vaginal estrogen twice per week.  The following portions of the patient's history were reviewed and updated as appropriate: allergies, current medications, past family history, past medical history, past social history, past surgical history, and problem list.  Review of Systems Pertinent items are noted in HPI.   Objective:   Blood pressure (!) 122/50, pulse 67, weight 93 lb (42.2 kg). Body mass index is 18.16 kg/m.  Well nourished, well hydrated White female, no apparent distress She is conversing normally. She is wearing nasal canula O2. She arrived in a wheelchair.  Ring with support pessary removed (looks like a #2) and cleaned Graves speculum used to inspect the vagina. There is a shallow 1 cm vaginal wall ulceration at the right cervicovaginal junction.     Assessment:   Uterine prolapse requiring pessary Vaginal wall ulceration  Plan:   Continue vaginal estrogen twice weekly 2 week break from pessary She will come back in 2 week for pessary placement.

## 2023-12-17 ENCOUNTER — Telehealth: Payer: Self-pay | Admitting: Pharmacist

## 2023-12-17 DIAGNOSIS — Z79899 Other long term (current) drug therapy: Secondary | ICD-10-CM

## 2023-12-17 NOTE — Progress Notes (Signed)
   12/17/2023  [name], female   DOB: 08-04-1932, 89 y.o.   MRN: [id number]  Called Patient's emergency contact, Angela Mason in an attempt to reach the Patient's daughter.   I called the [facility] SHIIP office earlier this month to inquire about what would be the best option to get the Patient enrolled.  The representative from [facility] said the Patient would not be eligible to enroll until May 11, 2024.  He also said there would be no way to get out of the penalty except if the Patient qualifies for LIS or Medicaid.    A LIS application can be completed online with a request for information to be sent to the state which would assess the patient for [facility] Medicaid as well.  If she were to qualify LIS or Medicaid programs would cover the penalty costs.   I can help with doing an evaluation for  the LIS/Extra Help Program but cannot complete the enrollment.   The representative from [facility] said  they can help the patient get enrolled and pick a plan during enrollment time ( Oct 15-Dec. They suggested establishing contact in September.  [phone]  Follow up Plan:  Await a call back from the Patient, her Daughter, or Ms. Cumbee.   Geronimo Krabbe, PharmD, BCACP Clinical Pharmacist [phone]

## 2023-12-18 NOTE — Progress Notes (Unsigned)
    GYNECOLOGY PROGRESS NOTE  Subjective:    [name], female    DOB: [birth year], 89 y.o.   MRN: [id number]  HPI Patient is a 89 y.o. G50P1001 female who presents for recheck of vaginal excoriation due to pessary.  She denies spotting currently. She denies urinary incontinence or retention and reports normal bowel function. She has been using a pessary for more than a decade (She is unable to tell me a more exact date). She has uterine prolapse with cystocele and rectocele.She uses vaginal estrogen twice per week.   She has not had the pessary in for the last 2 weeks and reports that there is essentially no difference with versus without the pessary.  The following portions of the patient's history were reviewed and updated as appropriate: allergies, current medications, past family history, past medical history, past social history, past surgical history, and problem list.  Review of Systems Pertinent items are noted in HPI.   Objective:   Blood pressure (!) 158/55, pulse 65, resp. rate 16, height 5' (1.524 m), weight 90 lb 1.6 oz (40.9 kg). Body mass index is 17.6 kg/m.  Well nourished, well hydrated White female, no apparent distress She is conversing normally. She is no longer wearing nasal canula O2. She arrived using a walker and is no longer in a wheelchair. She is currently in Rehab but expects to go back to assisted living in July 2025.  Graves speculum used to inspect the vagina. The excoriation has healed.     Assessment:   Uterine prolapse Vaginal excoriation  Plan:   Since there is no improvement in her life with the pessary, she plans to leave it out indefinitely. Since the pessary will not be in place, she does not need to use the vaginal estrogen unless she decides that she likes the way her vagina feels better with verus without the estrogen.   Stephane Ee, MD [facility]

## 2023-12-22 ENCOUNTER — Ambulatory Visit (INDEPENDENT_AMBULATORY_CARE_PROVIDER_SITE_OTHER): Admitting: Obstetrics & Gynecology

## 2023-12-22 ENCOUNTER — Encounter: Payer: Self-pay | Admitting: Obstetrics & Gynecology

## 2023-12-22 VITALS — BP 158/55 | HR 65 | Resp 16 | Ht 60.0 in | Wt 90.1 lb

## 2023-12-22 DIAGNOSIS — N813 Complete uterovaginal prolapse: Secondary | ICD-10-CM | POA: Diagnosis not present

## 2023-12-22 DIAGNOSIS — Z4689 Encounter for fitting and adjustment of other specified devices: Secondary | ICD-10-CM

## 2023-12-22 DIAGNOSIS — N952 Postmenopausal atrophic vaginitis: Secondary | ICD-10-CM

## 2023-12-22 NOTE — Patient Instructions (Signed)
 How to Use a Vaginal Pessary  A vaginal pessary is a removable device that is placed into your vagina to support pelvic organs that droop. These organs include your uterus, bladder, and rectum. When your pelvic organs drop down into your vagina, it causes a condition called pelvic organ prolapse (POP). A pessary may be an alternative to surgery for women with POP. It may help women who leak urine when they strain or exercise (stress incontinence). This is a symptom of POP. A vaginal pessary may also be a temporary treatment for stress incontinence during pregnancy. There are several types of pessaries. All types are usually made of silicone. You can insert and remove some on your own. Other types must be inserted and removed by your health care provider at office visits. The reason you are using a pessary and the severity of your condition will determine which one is best for you. It is also important to find the right size. A pessary that is too small may fall out. A pessary that is too large may cause pain or discomfort. Your health care provider will do a physical exam to find the correct size and fit for your pessary. It may take several appointments to find the best fit for you. If you can be fit with the type of pessary that you can insert, remove, and clean yourself, your health care provider will teach you how to use your pessary at home. You may have checkups every few months. If you have the type of pessary that needs to be inserted and removed by your health care provider, you will have appointments every few months to have the pessary removed, cleaned, and replaced. What are the risks? When properly fitted and cared for, risks of using a vaginal pessary can be small. However, there can be problems that may include: Vaginal discharge. Vaginal bleeding. A bad smell coming from your vagina. Scraping of the skin inside your vagina. How to use your pessary Follow your health care provider's  instructions for using a pessary. These instructions may vary, depending on the type of pessary you have. To insert a pessary: Wash your hands with soap and water for at least 20 seconds. Squeeze or fold the pessary in half and lubricate the tip with a water-based lubricant. Insert the pessary into your vagina. It will unfold and provide support. To remove the pessary, gently tug it out of your vagina. You can remove the pessary every night or after several days. You can also remove it to have sex. How to care for your pessary If you have a pessary that you can remove: Clean your pessary with soap and water. Rinse well. Dry it completely before inserting it back into your vagina. Follow these instructions at home: Take over-the-counter and prescription medicines only as told by your health care provider. Your health care provider may prescribe an estrogen cream to moisten your vagina. Keep all follow-up visits. This is important. Contact a health care provider if: You feel any pain or discomfort when your pessary is in place. You continue to have stress incontinence. You have trouble keeping your pessary from falling out. You have an unusual vaginal discharge that is blood-tinged or smells bad. Summary A vaginal pessary is a removable device that is placed into your vagina to support pelvic organs that droop. This condition is called pelvic organ prolapse (POP). There are several types of pessaries. Some you can insert and remove on your own. Others must be inserted and  removed by your health care provider. The best type for you depends on the reason you are using a pessary and the severity of your condition. It is also important to find the right size. If you can use the type that you insert and remove on your own, your health care provider will teach you how to use it and schedule checkups every few months. If you have the type that needs to be inserted and removed by your health care  provider, you will have regular appointments to have your pessary removed, cleaned, and replaced. This information is not intended to replace advice given to you by your health care provider. Make sure you discuss any questions you have with your health care provider. Document Revised: 01/12/2020 Document Reviewed: 01/12/2020 Elsevier Patient Education  2024 [employer].

## 2023-12-25 ENCOUNTER — Encounter: Payer: Self-pay | Admitting: Pulmonary Disease

## 2024-01-08 ENCOUNTER — Encounter: Payer: Self-pay | Admitting: Student

## 2024-01-08 ENCOUNTER — Non-Acute Institutional Stay (SKILLED_NURSING_FACILITY): Payer: Self-pay | Admitting: Student

## 2024-01-08 DIAGNOSIS — E43 Unspecified severe protein-calorie malnutrition: Secondary | ICD-10-CM

## 2024-01-08 DIAGNOSIS — K5901 Slow transit constipation: Secondary | ICD-10-CM | POA: Diagnosis not present

## 2024-01-08 DIAGNOSIS — J449 Chronic obstructive pulmonary disease, unspecified: Secondary | ICD-10-CM

## 2024-01-08 DIAGNOSIS — I480 Paroxysmal atrial fibrillation: Secondary | ICD-10-CM | POA: Diagnosis not present

## 2024-01-08 NOTE — Progress Notes (Signed)
 Provider:  Threasa Flood, M.D. Location:  [facility] Room Number: 6 S Place of Service:  SNF (31)  PCP: Valrie Gehrig, MD Patient Care Team: Valrie Gehrig, MD as PCP - General (Family Medicine)  Extended Emergency Contact Information Primary Emergency Contact: Cumbee,Carolyn Address: [address] (neighbor - 100 yards away)          [place], [place] [address] [place] Phone: [phone] Relation: Friend Secondary Emergency Contact: Obey,[name] Address: Note:  my last name is OBEY, not Champ Coma Home Phone: [phone] Relation: Daughter  Code Status: DNR Goals of Care: Advanced Directive information    10/28/2023   11:17 AM  Advanced Directives  Does Patient Have a Medical Advance Directive? No  Type of Estate agent of [place];Living will  Does patient want to make changes to medical advance directive? No - Patient declined  Copy of Healthcare Power of Attorney in Chart? No - copy requested  Would patient like information on creating a medical advance directive? No - Patient declined      Chief Complaint  Patient presents with   New Admit To SNF    New admission to [facility]     HPI: Patient is a 89 y.o. female seen today for admission to [facility] after skilled nursing stay. Patient was admitted for COPD and CHF exacerbation in the setting of new onset atrial fibrillation. History of Present Illness The patient, with chronic respiratory failure and heart failure, presents for follow-up of chronic respiratory failure with hypoxia and heart failure.  She has chronic respiratory failure with hypoxia and has been off oxygen therapy, which she finds beneficial. She has an upcoming appointment with pulmonology to assess her current treatment plan and oxygen needs. No recent wheezing or respiratory issues have been reported. She has not been using her albuterol  inhaler regularly,  only when administered by staff.  She has heart failure with reduced ejection fraction and reports significant persistent swelling in her foot. She has been taking diuretic medication to manage fluid retention, but the food at her current residence is very salty, which she believes contributes to her swelling. She has not been weighed frequently since moving to her current location, a change from previous care settings where she was weighed daily.  She uses a Dulera  inhaler with a spacer but expresses concerns about the administration by the current staff, feeling she is not as competent as previous caregivers. She also takes a medication called Eloquest, which she finds unpleasant as it seems to melt and taste terrible when swallowed. She has tried taking it with yogurt or pudding to mask the taste.  She reports eating well, having regular bowel movements, and urinating without issues. No new or worsening symptoms. She has been more active and busy, as evidenced by missing a phone call while trying to get up from a chair.    Past Medical History:  Diagnosis Date   Cancer (HCC)    SKIN   COPD, mild (HCC)    Dyspnea    Edema    Heart failure with mildly reduced ejection fraction (HFmrEF) (HCC)    HLD (hyperlipidemia)    HTN (hypertension)    Hypothyroidism    Macular degeneration, bilateral    Paroxysmal atrial fibrillation (HCC)    Varicosities    Past Surgical History:  Procedure Laterality Date   CATARACT EXTRACTION W/PHACO Right 02/03/2017   Procedure: CATARACT EXTRACTION PHACO AND INTRAOCULAR LENS PLACEMENT (  IOC);  Surgeon: Clair Crews, MD;  Location: [facility] ORS;  Service: Ophthalmology;  Laterality: Right;  US  00:57.2 AP% 18.5 CDE 10.58 Fluid pack lot # [id number]   CATARACT EXTRACTION W/PHACO Left 02/24/2017   Procedure: CATARACT EXTRACTION PHACO AND INTRAOCULAR LENS PLACEMENT (IOC);  Surgeon: Clair Crews, MD;  Location: [facility] ORS;  Service: Ophthalmology;  Laterality:  Left;  US  01:12 AP% 25.0 CDE 18.03 Fluid pack lot # [id number]   MOHS SURGERY  2015   nose   SALIVARY GLAND SURGERY Left 2012   TONSILLECTOMY  1940    reports that she quit smoking about 18 years ago. Her smoking use included cigarettes. She has never used smokeless tobacco. She reports that she does not currently use alcohol . She reports that she does not use drugs. Social History   Socioeconomic History   Marital status: Widowed    Spouse name: Not on file   Number of children: Not on file   Years of education: Not on file   Highest education level: Not on file  Occupational History   Not on file  Tobacco Use   Smoking status: Former    Current packs/day: 0.00    Types: Cigarettes    Quit date: 09/05/2005    Years since quitting: 18.3   Smokeless tobacco: Never  Vaping Use   Vaping status: Never Used  Substance and Sexual Activity   Alcohol  use: Not Currently   Drug use: No   Sexual activity: Not Currently    Birth control/protection: Post-menopausal  Other Topics Concern   Not on file  Social History Narrative   Not on file   Social Drivers of Health   Financial Resource Strain: Not on file  Food Insecurity: No Food Insecurity (10/28/2023)   Hunger Vital Sign    Worried About Running Out of Food in the Last Year: Never true    Ran Out of Food in the Last Year: Never true  Transportation Needs: No Transportation Needs (10/28/2023)   PRAPARE - Administrator, Civil Service (Medical): No    Lack of Transportation (Non-Medical): No  Physical Activity: Not on file  Stress: Not on file  Social Connections: Socially Isolated (10/28/2023)   Social Connection and Isolation Panel    Frequency of Communication with Friends and Family: More than three times a week    Frequency of Social Gatherings with Friends and Family: Twice a week    Attends Religious Services: Never    Database administrator or Organizations: No    Attends Banker Meetings: Never     Marital Status: Widowed  Intimate Partner Violence: Not At Risk (10/28/2023)   Humiliation, Afraid, Rape, and Kick questionnaire    Fear of Current or Ex-Partner: No    Emotionally Abused: No    Physically Abused: No    Sexually Abused: No    Functional Status Survey:    Family History  Problem Relation Age of Onset   Diverticulitis Mother    Macular degeneration Mother    COPD Father    Heart failure Father    Epilepsy Sister    Parkinson's disease Brother    Cancer Neg Hx    Diabetes Neg Hx    Breast cancer Neg Hx     Health Maintenance  Topic Date Due   DTaP/Tdap/Td (1 - Tdap) Never done   DEXA SCAN  Never done   COVID-19 Vaccine (1) 06/12/2020   INFLUENZA VACCINE  02/26/2024   Medicare Annual Wellness (  AWV)  07/08/2024   Pneumococcal Vaccine: 50+ Years  Completed   Zoster Vaccines- Shingrix  Completed   HPV VACCINES  Aged Out   Meningococcal B Vaccine  Aged Out    Allergies  Allergen Reactions   Gentamicin    Levofloxacin Diarrhea    Outpatient Encounter Medications as of 01/08/2024  Medication Sig   acetaminophen  (TYLENOL ) 325 MG tablet Take 650 mg by mouth every 4 (four) hours as needed.   albuterol  (VENTOLIN  HFA) 108 (90 Base) MCG/ACT inhaler Inhale 2 puffs into the lungs every 4 (four) hours as needed for wheezing or shortness of breath.   alum & mag hydroxide-simeth (MAALOX/MYLANTA) 200-200-20 MG/5 SUSP 2 tbsp by mouth every 4 hours as needed for gas, indigestion, or upset stomach   amiodarone  (PACERONE ) 200 MG tablet Take 200 mg by mouth daily.   apixaban  (ELIQUIS ) 2.5 MG TABS tablet Take 1 tablet (2.5 mg total) by mouth 2 (two) times daily.   apixaban  (ELIQUIS ) 5 MG TABS tablet Take 5 mg by mouth 2 (two) times daily.   ARIPiprazole  (ABILIFY ) 2 MG tablet Take 1 tablet (2 mg total) by mouth daily with breakfast.   bismuth subsalicylate (PEPTO BISMOL) 262 MG/15ML suspension Take 30 mLs by mouth as needed.   cetirizine (ZYRTEC) 5 MG tablet Take 5 mg by mouth  as needed for allergies.   dapagliflozin  propanediol (FARXIGA ) 10 MG TABS tablet Take 1 tablet (10 mg total) by mouth daily.   estradiol  (ESTRACE ) 0.1 MG/GM vaginal cream Place 1 Applicatorful vaginally 2 (two) times a week.   furosemide  (LASIX ) 20 MG tablet Take 1 tablet (20 mg total) by mouth daily.   furosemide  (LASIX ) 20 MG tablet Take 20 mg by mouth as needed (for weight gain of 2 lbs over night or 5 lbs in a week, give potassium 10 meq if giving furosemide  and notify MD).   Glucose 15 GM/32ML GEL Take 1 packet by mouth as needed (for low blood sugar).   guaifenesin  (ROBITUSSIN) 100 MG/5ML syrup 2 tsp by mouth every 4 hours as needed for coughing   magnesium  hydroxide (MILK OF MAGNESIA) 400 MG/5ML suspension 2 tsp by mouth as needed for constipation   metoprolol  succinate (TOPROL -XL) 25 MG 24 hr tablet Take 0.5 tablets (12.5 mg total) by mouth 2 (two) times daily.   mometasone -formoterol  (DULERA ) 100-5 MCG/ACT AERO Inhale 2 puffs into the lungs 2 (two) times daily.   mometasone -formoterol  (DULERA ) 200-5 MCG/ACT AERO Inhale 2 puffs into the lungs 2 (two) times daily.   Multiple Vitamins-Minerals (PRESERVISION AREDS) TABS Take 1 capsule by mouth 2 (two) times daily.   nystatin  (MYCOSTATIN /NYSTOP ) powder Apply 1 Application topically 2 (two) times daily.   Omega-3 Fatty Acids (FISH OIL CONCENTRATE PO) Take 1 tablet by mouth daily.    ondansetron  (ZOFRAN ) 4 MG tablet Take 4 mg by mouth as needed for nausea or vomiting.   OXYGEN 2 lpm for dyspnea or SOB   polyethylene glycol powder (GLYCOLAX/MIRALAX) 17 GM/SCOOP powder Take by mouth once. 0.5 scoop one time a day every Mon, Wed, Fri   Polyvinyl Alcohol -Povidone PF (REFRESH) 1.4-0.6 % SOLN Place 1 drop into both eyes at bedtime.   potassium chloride  (KLOR-CON  M) 10 MEQ tablet Take 10 mEq by mouth as needed. Give if giving furosemide  for weight gain   pravastatin  (PRAVACHOL ) 20 MG tablet Take 1 tablet (20 mg total) by mouth daily at 6 PM.   No  facility-administered encounter medications on file as of 01/08/2024.    Review  of Systems  Vitals:   01/08/24 1406  BP: (!) 163/79  Pulse: 64  Weight: 95 lb (43.1 kg)  Height: 5' (1.524 m)   Body mass index is 18.55 kg/m. Physical Exam Constitutional:      Appearance: Normal appearance.   Neurological:     General: No focal deficit present.     Mental Status: She is alert. Mental status is at baseline.    Physical Exam CHEST: Wheezing throughout lung fields. EXTREMITIES: Swelling in extremities.  Labs reviewed: Basic Metabolic Panel: Recent Labs    10/17/23 1638 10/17/23 1930 10/29/23 0321 10/30/23 0429 10/31/23 0606  NA  --    < > 139 135 139  K  --    < > 3.7 3.1* 4.3  CL  --    < > 97* 92* 99  CO2  --    < > 35* 35* 34*  GLUCOSE  --    < > 151* 129* 94  BUN  --    < > 34* 36* 37*  CREATININE  --    < > 1.02* 0.93 0.83  CALCIUM   --    < > 8.1* 8.2* 8.5*  MG 2.3  --   --   --  2.8*  PHOS 3.2  --   --   --   --    < > = values in this interval not displayed.   Liver Function Tests: Recent Labs    07/02/23 0000 10/17/23 1502  AST 18 30  ALT 13 39  ALKPHOS 76 70  BILITOT  --  0.9  PROT  --  6.4*  ALBUMIN 4.0 3.2*   No results for input(s): LIPASE, AMYLASE in the last 8760 hours. No results for input(s): AMMONIA in the last 8760 hours. CBC: Recent Labs    07/02/23 0000 10/17/23 1502 10/20/23 0600 10/26/23 0000 10/28/23 0500 10/29/23 0321  WBC 7.5   < > 11.3* 8.0 13.3* 9.9  NEUTROABS 6,165.00  --   --  6,304.00 10.0*  --   HGB 11.7*   < > 10.3* 11.0* 13.0 9.5*  HCT 36   < > 30.5* 35* 42.6 29.7*  MCV  --    < > 97.1  --  105.7* 101.4*  PLT 252   < > 231 260 298 187   < > = values in this interval not displayed.   Cardiac Enzymes: No results for input(s): CKTOTAL, CKMB, CKMBINDEX, TROPONINI in the last 8760 hours. BNP: Invalid input(s): POCBNP Lab Results  Component Value Date   HGBA1C 5.6 11/03/2022   Lab Results   Component Value Date   TSH 0.968 10/17/2023   Lab Results  Component Value Date   VITAMINB12 1,225 (H) 10/10/2020   No results found for: FOLATE No results found for: IRON, TIBC, FERRITIN  Imaging and Procedures obtained prior to SNF admission: DG Chest 2 View Result Date: 12/04/2023 CLINICAL DATA:  89 year old female with pulmonary edema EXAM: CHEST - 2 VIEW COMPARISON:  10/28/2023 FINDINGS: Cardiomediastinal silhouette unchanged in size and contour. Improving interlobular septal thickening. Aeration of the lungs improving. Opacities at the lung bases persist with meniscus, blunting of the bilateral costophrenic angles and the sulcus on the lateral view. Degenerative changes of the spine. Osteopenia. No displaced fracture. Stigmata of emphysema, with increased retrosternal airspace, flattened hemidiaphragms, increased AP diameter, and hyperinflation on the AP view. IMPRESSION: Results pulmonary edema. Trace bilateral pleural effusions. Emphysema Electronically Signed   By: Myrlene Asper D.O.   On:  12/04/2023 14:36   Results DIAGNOSTIC Pulmonology follow-up: Chronic respiratory failure with hypoxia. Adjust oxygen requirements as needed. Consider reducing oxygen at nighttime use if progress continues. (12/03/2023) Cardiac function assessment: Decreased cardiac contractility. Continue compression stockings. Evaluate the need for additional diuretic medication to manage fluid retention. (12/03/2023)   Assessment/Plan Chronic respiratory failure with hypoxia Chronic respiratory failure with hypoxia is well-managed. No longer requiring supplemental oxygen. Concerns about inhaler administration by facility staff. - Continue current management plan. - Attend upcoming pulmonology appointment for further evaluation. - Discuss inhaler administration technique with facility staff to ensure proper medication delivery.  Wheezing Wheezing on auscultation suggests suboptimal control of  respiratory symptoms. Concerns about inhaler administration by facility staff. Not using albuterol  inhaler independently. - Ensure availability and proper use of albuterol  inhaler for acute episodes. - Discuss inhaler administration technique with facility staff to ensure proper medication delivery. - Consider additional education for staff on inhaler use and monitoring.  Heart failure Heart failure with reduced ejection fraction. Reports increased peripheral edema and difficulty with fluid management. Concerns about dietary sodium intake contributing to fluid retention. - Increase diuretic dosage temporarily to manage fluid overload and peripheral edema. - Monitor weight weekly to assess fluid status. - Discuss dietary sodium intake with facility staff to reduce salt in meals.  Peripheral edema Peripheral edema likely secondary to heart failure and dietary sodium intake. Significant swelling in lower extremities. - Increase diuretic dosage temporarily to manage peripheral edema. - Monitor weight weekly to assess fluid status. - Discuss dietary sodium intake with facility staff to reduce salt in meals.    Family/ staff Communication: Nursing, [name]   Labs/tests ordered: None

## 2024-01-13 ENCOUNTER — Ambulatory Visit (INDEPENDENT_AMBULATORY_CARE_PROVIDER_SITE_OTHER): Admitting: Pulmonary Disease

## 2024-01-13 ENCOUNTER — Encounter: Payer: Self-pay | Admitting: Pulmonary Disease

## 2024-01-13 VITALS — BP 130/80 | HR 66 | Temp 97.7°F | Ht 60.0 in | Wt 93.6 lb

## 2024-01-13 DIAGNOSIS — I501 Left ventricular failure: Secondary | ICD-10-CM

## 2024-01-13 DIAGNOSIS — I480 Paroxysmal atrial fibrillation: Secondary | ICD-10-CM | POA: Diagnosis not present

## 2024-01-13 DIAGNOSIS — J449 Chronic obstructive pulmonary disease, unspecified: Secondary | ICD-10-CM | POA: Diagnosis not present

## 2024-01-13 DIAGNOSIS — I5020 Unspecified systolic (congestive) heart failure: Secondary | ICD-10-CM

## 2024-01-13 DIAGNOSIS — J9601 Acute respiratory failure with hypoxia: Secondary | ICD-10-CM

## 2024-01-13 DIAGNOSIS — Z87891 Personal history of nicotine dependence: Secondary | ICD-10-CM

## 2024-01-13 DIAGNOSIS — J9602 Acute respiratory failure with hypercapnia: Secondary | ICD-10-CM

## 2024-01-13 NOTE — Progress Notes (Signed)
 Subjective:    [name], female    DOB: 08/22/1932, 89 y.o.   MRN: [id number]  Patient Care Team: Valrie Gehrig, MD as PCP - General [facility] Medicine)  Chief Complaint  Patient presents with   Follow-up    Cough, shortness of breath and wheezing.     BACKGROUND/INTERVAL: 89 y.o. former smoker with medical history significant of HTN, HLD, PAF on Eliquis , COPD, hypothyroidism, admitted to [facility] between 28 October 2023 and 31 October 2023 for atrial fibrillation with RVR.  She also had decompensation of systolic heart failure.  Elevated troponins at that time, B-natriuretic peptide 1228 consistent with CHF, echocardiogram showing LVEF of 35 to 40% a significant decline from her echocardiogram of 23 March.  Presents for evaluation of pulmonary function.  History of COPD with FEV1 unknown and no prior need for inhalers until recent admissions to [facility].  Was discharged on oxygen at 2 L/min due to acute respiratory failure with hypoxia and hypercapnia.  She was initially seen here on 06 November 2023.  Last seen by me on 03 Dec 2023.  She presents for a scheduled follow-up.  HPI Discussed the use of AI scribe software for clinical note transcription with the patient, who gave verbal consent to proceed.  History of Present Illness   Prescious Mason is an 89 year old female with COPD and non-ischemic cardiomyopathy who presents for follow-up of her chronic congestive systolic heart failure.  She presents today with a friend, Orelia Binet.  She feels 'okay' and notes a sense of 'good progress' in her condition. She is not currently using oxygen at night, having been able to stop its use. No chest pain or shortness of breath. Oxygen levels are stable.  She continues physical therapy through her assisted living, [place].  She continues to use the Dulera  inhaler twice a day. There has been a recent increase in swelling, and her diuretics were adjusted last week. She has experienced some wheezing  and a 'wetter' sound in her breathing, but these symptoms improved with the adjustment in diuretics.  She resides in an assisted living facility, which provides her with additional support.      Review of Systems A 10 point review of systems was performed and it is as noted above otherwise negative.   Patient Active Problem List   Diagnosis Date Noted   Afib (HCC) 10/28/2023   Myocardial injury 10/17/2023   Acute paranoid reaction (HCC) 09/12/2022   Constipation by delayed colonic transit 08/06/2022   Pedal edema 08/06/2022   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) 05/12/2022   HLD (hyperlipidemia) 04/30/2022   Hypothyroidism 04/30/2022   Other symptoms and signs involving cognitive functions and awareness 03/03/2022   History of falling 02/01/2022   Muscle weakness (generalized) 02/01/2022   Need for assistance with personal care 02/01/2022   Other abnormalities of gait and mobility 02/01/2022   Other specified fracture of left pubis, subsequent encounter for fracture with routine healing 02/01/2022   Personal history of other malignant neoplasm of skin 02/01/2022   Protein-calorie malnutrition, severe 01/29/2022   Closed fracture of superior ramus of left pubis (HCC) 01/27/2022   Leukocytosis 01/27/2022   COPD (chronic obstructive pulmonary disease) (HCC) 01/27/2022   MDD (major depressive disorder), recurrent episode, moderate (HCC) 07/31/2021   Insomnia 07/31/2021   History of delirium 03/05/2021   Unspecified macular degeneration 10/16/2020   Severe major depression, single episode, with psychotic features (HCC) 10/12/2020   UTI (  urinary tract infection) 10/04/2020   HTN (hypertension)    Hyponatremia    Underweight 08/08/2020   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 08/04/2018   Cystocele, midline 05/05/2017   Nocturia 10/07/2016   Unstable bladder 10/07/2016   Vaginal atrophy  01/23/2016   Procidentia of uterus 11/01/2015    Social History   Tobacco Use   Smoking status: Former    Current packs/day: 0.00    Types: Cigarettes    Quit date: 09/05/2005    Years since quitting: 18.3   Smokeless tobacco: Never  Substance Use Topics   Alcohol  use: Not Currently    Allergies  Allergen Reactions   Gentamicin    Levofloxacin Diarrhea    Current Meds  Medication Sig   acetaminophen  (TYLENOL ) 325 MG tablet Take 650 mg by mouth every 4 (four) hours as needed.   albuterol  (VENTOLIN  HFA) 108 (90 Base) MCG/ACT inhaler Inhale 2 puffs into the lungs every 4 (four) hours as needed for wheezing or shortness of breath.   amiodarone  (PACERONE ) 200 MG tablet Take 200 mg by mouth daily.   apixaban  (ELIQUIS ) 2.5 MG TABS tablet Take 1 tablet (2.5 mg total) by mouth 2 (two) times daily. (Patient taking differently: Take 5 mg by mouth 2 (two) times daily.)   ARIPiprazole  (ABILIFY ) 2 MG tablet Take 1 tablet (2 mg total) by mouth daily with breakfast.   bismuth subsalicylate (PEPTO BISMOL) 262 MG/15ML suspension Take 30 mLs by mouth as needed.   cetirizine (ZYRTEC) 5 MG tablet Take 5 mg by mouth as needed for allergies.   dapagliflozin  propanediol (FARXIGA ) 10 MG TABS tablet Take 1 tablet (10 mg total) by mouth daily.   metoprolol  succinate (TOPROL -XL) 25 MG 24 hr tablet Take 0.5 tablets (12.5 mg total) by mouth 2 (two) times daily.   mometasone -formoterol  (DULERA ) 200-5 MCG/ACT AERO Inhale 2 puffs into the lungs 2 (two) times daily.   Multiple Vitamins-Minerals (PRESERVISION AREDS) TABS Take 1 capsule by mouth 2 (two) times daily.   Omega-3 Fatty Acids (FISH OIL CONCENTRATE PO) Take 1 tablet by mouth daily.    polyethylene glycol powder (GLYCOLAX/MIRALAX) 17 GM/SCOOP powder Take by mouth once. 0.5 scoop one time a day every Mon, Wed, Fri   Polyvinyl Alcohol -Povidone PF (REFRESH) 1.4-0.6 % SOLN Place 1 drop into both eyes at bedtime.   pravastatin  (PRAVACHOL ) 20 MG tablet Take 1  tablet (20 mg total) by mouth daily at 6 PM.    Immunization History  Administered Date(s) Administered   Influenza, High Dose Seasonal PF 05/21/2017, 05/07/2022   Influenza-Unspecified 05/09/2021, 05/15/2023   PFIZER SARS-COV-2 Pediatric Vaccination 5-75yrs 08/09/2019, 09/06/2019, 06/12/2020   PNEUMOCOCCAL CONJUGATE-20 02/11/2022   PPD Test 10/16/2020, 10/26/2020, 02/01/2022, 02/10/2022   Pneumococcal Conjugate-13 08/08/2018   Zoster Recombinant(Shingrix) 02/05/2023, 06/10/2023        Objective:     BP 130/80 (BP Location: Left Arm, Patient Position: Sitting, Cuff Size: Normal)   Pulse 66   Temp 97.7 F (36.5 C) (Oral)   Ht 5' (1.524 m)   Wt 93 lb 9.6 oz (42.5 kg)   SpO2 93%   BMI 18.28 kg/m   SpO2: 93 %  GENERAL: Very frail appearing elderly woman, awake and alert, very responsive.  Ambulatory with assistance of a walker.  No respiratory distress on room air. HEAD: Normocephalic, atraumatic.  EYES: Pupils equal, round, reactive to light.  No scleral icterus.  MOUTH: Poor dentition, oral mucosa moist.  No thrush NECK: Supple. No thyromegaly. Trachea midline. No JVD.  No adenopathy. PULMONARY: Good air entry bilaterally.  No adventitious sounds. CARDIOVASCULAR: S1 and S2. Regular rate and rhythm with occasional extrasystoles.  No rubs murmurs gallops heard. ABDOMEN: Benign. MUSCULOSKELETAL: No joint deformity, no clubbing, 1-2+ edema of the lower extremities wearing compression stockings.  NEUROLOGIC: No overt focal deficit.  Mild psychomotor retardation. SKIN: Intact,warm,dry. PSYCH: Interacting appropriately, normal mood and behavior.     Assessment & Plan:     ICD-10-CM   1. COPD suggested by initial evaluation (HCC)  J44.9     2. Systolic CHF with reduced left ventricular function, NYHA class 3 (HCC)  I50.20     3. Paroxysmal atrial fibrillation (HCC)  I48.0     4. Pulmonary edema cardiac cause (HCC)  I50.1     5. Acute respiratory failure with hypoxia and  hypercapnia (HCC) - RESOLVED  J96.01    J96.02      Discussion:    Chronic Obstructive Pulmonary Disease (COPD) COPD is well-managed with no current use of nighttime oxygen. She has been weaned off nighttime oxygen, indicating improved respiratory status. Continued use of Dulera  inhaler is effective, with emphasis on rinsing mouth post-use to prevent oral side effects. - Schedule baseline pulmonary function tests within the next month. - Continue Dulera  inhaler with post-use mouth rinsing. - Albuterol  inhaler as needed.  Chronic Systolic Heart Failure Chronic systolic heart failure with mild edema. Diuretics have been adjusted to manage fluid status and prevent respiratory compromise. Wheezing and pulmonary congestion are being managed. Monitoring diuretic therapy is crucial to prevent symptom exacerbation.  Nonischemic Cardiomyopathy Nonischemic cardiomyopathy with associated chronic systolic heart failure. Management includes monitoring and adjusting diuretics to control symptoms and prevent heart failure exacerbation. Continued observation of cardiac status is necessary to ensure stability.   Will see the patient in follow-up in 3 months time with PFTs at the time of follow-up.     Advised if symptoms do not improve or worsen, to please contact office for sooner follow up or seek emergency care.    I spent 32 minutes of dedicated to the care of this patient on the date of this encounter to include pre-visit review of records, face-to-face time with the patient discussing conditions above, post visit ordering of testing, clinical documentation with the electronic health record, making appropriate referrals as documented, and communicating necessary findings to members of the patients care team.     C. Chloe Counter, MD Advanced Bronchoscopy PCCM [facility]    *This note was generated using voice recognition software/Dragon and/or AI transcription program.   Despite best efforts to proofread, errors can occur which can change the meaning. Any transcriptional errors that result from this process are unintentional and may not be fully corrected at the time of dictation.

## 2024-01-13 NOTE — Patient Instructions (Signed)
 VISIT SUMMARY:  Today, we discussed your chronic conditions, including COPD and chronic systolic heart failure. You reported feeling okay and noted good progress in your condition. You have successfully stopped using nighttime oxygen and are managing your symptoms well with your current medications. We also reviewed your recent increase in swelling and adjusted your diuretics accordingly.  YOUR PLAN:  -CHRONIC OBSTRUCTIVE PULMONARY DISEASE (COPD): COPD is a chronic lung disease that makes it hard to breathe. Your condition is well-managed, and you no longer need nighttime oxygen. Continue using your Dulera  inhaler and remember to rinse your mouth after each use to prevent any side effects. We will schedule baseline pulmonary function tests within the next month to monitor your lung function.  -CHRONIC SYSTOLIC HEART FAILURE: Chronic systolic heart failure is a condition where the heart doesn't pump blood as well as it should. You have mild swelling, and we have adjusted your diuretics to help manage this. It's important to monitor your symptoms and continue with the current treatment to prevent any worsening of your condition.  -NONISCHEMIC CARDIOMYOPATHY: Nonischemic cardiomyopathy is a disease of the heart muscle that is not related to blocked arteries. It is associated with your chronic systolic heart failure. We will continue to monitor and adjust your diuretics to control your symptoms and prevent any exacerbation of heart failure. Regular observation of your cardiac status is necessary to ensure stability.  INSTRUCTIONS:  Please schedule baseline pulmonary function tests within the next month or so. Continue using your Dulera  inhaler and rinse your mouth after each use. Monitor your symptoms and follow the adjusted diuretic regimen. Regular follow-up appointments are essential to manage your conditions effectively.  We will see you in follow-up in 3 months time call sooner should any new problems  arise.

## 2024-01-21 ENCOUNTER — Ambulatory Visit: Admitting: Pulmonary Disease

## 2024-02-02 ENCOUNTER — Ambulatory Visit: Admitting: Pulmonary Disease

## 2024-02-19 ENCOUNTER — Non-Acute Institutional Stay: Payer: Self-pay | Admitting: Student

## 2024-02-19 ENCOUNTER — Encounter: Payer: Self-pay | Admitting: Student

## 2024-02-19 DIAGNOSIS — I10 Essential (primary) hypertension: Secondary | ICD-10-CM

## 2024-02-19 DIAGNOSIS — I509 Heart failure, unspecified: Secondary | ICD-10-CM | POA: Diagnosis not present

## 2024-02-19 DIAGNOSIS — J9611 Chronic respiratory failure with hypoxia: Secondary | ICD-10-CM | POA: Diagnosis not present

## 2024-02-19 NOTE — Progress Notes (Signed)
 Location:  Other [facility].  Nursing Home Room Number: [facility] 792D Place of Service:  ALF (13) Provider:  Abdul Fine, MD  Patient Care Team: Abdul Fine, MD as PCP - General (Family Medicine)  Extended Emergency Contact Information Primary Emergency Contact: Cumbee,Carolyn Address: [address] (neighbor - 100 yards away)          [place], [place] [address] [place] Phone: [phone] Relation: Friend Secondary Emergency Contact: Obey,[name] Address: Note:  my last name is OBEY, not LEORY [place] Home Phone: [phone] Relation: Daughter  Code Status:  Full Code Goals of care: Advanced Directive information    10/28/2023   11:17 AM  Advanced Directives  Does Patient Have a Medical Advance Directive? No  Type of Estate agent of [place];Living will  Does patient want to make changes to medical advance directive? No - Patient declined  Copy of Healthcare Power of Attorney in Chart? No - copy requested  Would patient like information on creating a medical advance directive? No - Patient declined     Chief Complaint  Patient presents with   Hypertension    Hypertension.     HPI:  Pt is a 89 y.o. female seen today for an acute visit for Hypertension.  History of Present Illness The patient presents with high blood pressure and concerns about oxygen levels.  She has been experiencing elevated blood pressure, with readings reaching up to 216/unknown, which decrease to 190/unknown at rest. Her blood pressure drops with exertion and returns to normal at rest. She has a history of high blood pressure and was previously on losartan , which was discontinued during a hospital stay and the post-acute period. She is currently on metoprolol , which affects her heart rate more than her blood pressure.  Her oxygen saturation has been noted to drop to 87% with exertion and improve to 89% at rest. She does not  frequently use her albuterol  inhaler. She has not experienced shortness of breath, chest pain, or weakness.  Her current medications include metoprolol  and Abilify , which she has been taking consistently since moving in with her daughter. Her daughter had previously expressed concerns about her adherence to this medication.  She has noticed some swelling in her legs and a slight weight increase from 93.2 pounds to 94 pounds. She has not taken Lasix  recently, as it is administered based on weight changes, which have been stable until now.  No shortness of breath, chest pain, or weakness. She reports longstanding poor vision and is being monitored for glaucoma.   Past Medical History:  Diagnosis Date   Cancer (HCC)    SKIN   COPD, mild (HCC)    Dyspnea    Edema    Heart failure with mildly reduced ejection fraction (HFmrEF) (HCC)    HLD (hyperlipidemia)    HTN (hypertension)    Hypothyroidism    Macular degeneration, bilateral    Paroxysmal atrial fibrillation (HCC)    Varicosities    Past Surgical History:  Procedure Laterality Date   CATARACT EXTRACTION W/PHACO Right 02/03/2017   Procedure: CATARACT EXTRACTION PHACO AND INTRAOCULAR LENS PLACEMENT (IOC);  Surgeon: Jaye Fallow, MD;  Location: [facility] ORS;  Service: Ophthalmology;  Laterality: Right;  US  00:57.2 AP% 18.5 CDE 10.58 Fluid pack lot # [id number]   CATARACT EXTRACTION W/PHACO Left 02/24/2017   Procedure: CATARACT EXTRACTION PHACO AND INTRAOCULAR LENS PLACEMENT (IOC);  Surgeon: Jaye Fallow, MD;  Location: [facility] ORS;  Service: Ophthalmology;  Laterality: Left;  US  01:12 AP% 25.0 CDE 18.03 Fluid pack lot # [id number]   MOHS SURGERY  2015   nose   SALIVARY GLAND SURGERY Left 2012   TONSILLECTOMY  1940    Allergies  Allergen Reactions   Gentamicin    Levofloxacin Diarrhea    Outpatient Encounter Medications as of 02/19/2024  Medication Sig   acetaminophen  (TYLENOL ) 325 MG tablet Take 650 mg by mouth every 4  (four) hours as needed.   albuterol  (VENTOLIN  HFA) 108 (90 Base) MCG/ACT inhaler Inhale 2 puffs into the lungs every 4 (four) hours as needed for wheezing or shortness of breath.   alum & mag hydroxide-simeth (MAALOX/MYLANTA) 200-200-20 MG/5 SUSP 2 tbsp by mouth every 4 hours as needed for gas, indigestion, or upset stomach   amiodarone  (PACERONE ) 200 MG tablet Take 200 mg by mouth daily.   apixaban  (ELIQUIS ) 2.5 MG TABS tablet Take 1 tablet (2.5 mg total) by mouth 2 (two) times daily. (Patient taking differently: Take 5 mg by mouth 2 (two) times daily.)   ARIPiprazole  (ABILIFY ) 2 MG tablet Take 1 tablet (2 mg total) by mouth daily with breakfast.   bismuth subsalicylate (PEPTO BISMOL) 262 MG/15ML suspension Take 30 mLs by mouth as needed.   carbamide peroxide (DEBROX) 6.5 % OTIC solution 5 drops as needed.   cetirizine (ZYRTEC) 5 MG tablet Take 5 mg by mouth as needed for allergies.   dapagliflozin  propanediol (FARXIGA ) 10 MG TABS tablet Take 1 tablet (10 mg total) by mouth daily.   estradiol  (ESTRACE ) 0.1 MG/GM vaginal cream Place 1 Applicatorful vaginally 2 (two) times a week.   furosemide  (LASIX ) 20 MG tablet Take 20 mg by mouth as needed (for weight gain of 2 lbs over night or 5 lbs in a week, give potassium 10 meq if giving furosemide  and notify MD).   Glucose 15 GM/32ML GEL Take 1 packet by mouth as needed (for low blood sugar).   guaifenesin  (ROBITUSSIN) 100 MG/5ML syrup 2 tsp by mouth every 4 hours as needed for coughing   magnesium  hydroxide (MILK OF MAGNESIA) 400 MG/5ML suspension 2 tsp by mouth as needed for constipation   metoprolol  succinate (TOPROL -XL) 25 MG 24 hr tablet Take 0.5 tablets (12.5 mg total) by mouth 2 (two) times daily.   mometasone -formoterol  (DULERA ) 100-5 MCG/ACT AERO Inhale 2 puffs into the lungs 2 (two) times daily.   Multiple Vitamins-Minerals (PRESERVISION AREDS) TABS Take 1 capsule by mouth 2 (two) times daily.   nystatin  (MYCOSTATIN /NYSTOP ) powder Apply 1  Application topically 2 (two) times daily.   Omega-3 Fatty Acids (FISH OIL CONCENTRATE PO) Take 1 tablet by mouth daily.    ondansetron  (ZOFRAN ) 4 MG tablet Take 4 mg by mouth as needed for nausea or vomiting.   OXYGEN 2 lpm for dyspnea or SOB   polyethylene glycol powder (GLYCOLAX/MIRALAX) 17 GM/SCOOP powder Take by mouth once. 0.5 scoop one time a day every Mon, Wed, Fri   Polyvinyl Alcohol -Povidone PF (REFRESH) 1.4-0.6 % SOLN Place 1 drop into both eyes at bedtime.   potassium chloride  (KLOR-CON  M) 10 MEQ tablet Take 10 mEq by mouth as needed. Give if giving furosemide  for weight gain   pravastatin  (PRAVACHOL ) 20 MG tablet Take 1 tablet (20 mg total) by mouth daily at 6 PM.   furosemide  (LASIX ) 20 MG tablet Take 1 tablet (20 mg total) by mouth daily. (Patient not taking: Reported on 02/19/2024)   mometasone -formoterol  (DULERA ) 200-5 MCG/ACT AERO Inhale 2 puffs into the lungs 2 (two) times daily. (Patient not  taking: Reported on 02/19/2024)   No facility-administered encounter medications on file as of 02/19/2024.    Review of Systems  Immunization History  Administered Date(s) Administered   Influenza, High Dose Seasonal PF 05/21/2017, 05/07/2022   Influenza-Unspecified 05/09/2021, 05/15/2023   PFIZER SARS-COV-2 Pediatric Vaccination 5-6yrs 08/09/2019, 09/06/2019, 06/12/2020   PNEUMOCOCCAL CONJUGATE-20 02/11/2022   PPD Test 10/16/2020, 10/26/2020, 02/01/2022, 02/10/2022   Pneumococcal Conjugate-13 08/08/2018   Zoster Recombinant(Shingrix) 02/05/2023, 06/10/2023   Pertinent  Health Maintenance Due  Topic Date Due   DEXA SCAN  Never done   INFLUENZA VACCINE  02/26/2024      09/19/2022    2:02 PM 11/14/2022    1:17 PM 01/14/2023    1:48 PM 01/30/2023    3:20 PM 03/03/2023    3:50 PM  Fall Risk  Falls in the past year? 0 0 0 0 0  Was there an injury with Fall? 0 0 0 0 0  Fall Risk Category Calculator 0 0 0 0 0  Patient at Risk for Falls Due to No Fall Risks   No Fall Risks Impaired  balance/gait  Fall risk Follow up Falls evaluation completed   Falls evaluation completed Falls evaluation completed   Functional Status Survey:    Vitals:   02/19/24 1301 02/19/24 1313  BP: (!) 165/66 (!) 163/79  Pulse: 80   Resp: 17   Temp: (!) 97.5 F (36.4 C)   SpO2: 90%   Weight: 94 lb (42.6 kg)   Height: 5' (1.524 m)    Body mass index is 18.36 kg/m. Physical Exam Cardiovascular:     Rate and Rhythm: Normal rate.  Pulmonary:     Effort: Pulmonary effort is normal.     Breath sounds: Wheezing present.  Abdominal:     General: Abdomen is flat.     Palpations: Abdomen is soft.  Musculoskeletal:        General: Swelling present.  Skin:    General: Skin is warm.  Neurological:     General: No focal deficit present.     Mental Status: She is alert. Mental status is at baseline.     Labs reviewed: Recent Labs    10/17/23 1638 10/17/23 1930 10/29/23 0321 10/30/23 0429 10/31/23 0606  NA  --    < > 139 135 139  K  --    < > 3.7 3.1* 4.3  CL  --    < > 97* 92* 99  CO2  --    < > 35* 35* 34*  GLUCOSE  --    < > 151* 129* 94  BUN  --    < > 34* 36* 37*  CREATININE  --    < > 1.02* 0.93 0.83  CALCIUM   --    < > 8.1* 8.2* 8.5*  MG 2.3  --   --   --  2.8*  PHOS 3.2  --   --   --   --    < > = values in this interval not displayed.   Recent Labs    07/02/23 0000 10/17/23 1502  AST 18 30  ALT 13 39  ALKPHOS 76 70  BILITOT  --  0.9  PROT  --  6.4*  ALBUMIN 4.0 3.2*   Recent Labs    07/02/23 0000 10/17/23 1502 10/20/23 0600 10/26/23 0000 10/28/23 0500 10/29/23 0321  WBC 7.5   < > 11.3* 8.0 13.3* 9.9  NEUTROABS 6,165.00  --   --  6,304.00 10.0*  --  HGB 11.7*   < > 10.3* 11.0* 13.0 9.5*  HCT 36   < > 30.5* 35* 42.6 29.7*  MCV  --    < > 97.1  --  105.7* 101.4*  PLT 252   < > 231 260 298 187   < > = values in this interval not displayed.   Lab Results  Component Value Date   TSH 0.968 10/17/2023   Lab Results  Component Value Date   HGBA1C  5.6 11/03/2022   Lab Results  Component Value Date   CHOL 195 07/02/2023   HDL 68 07/02/2023   LDLCALC 108 07/02/2023   TRIG 101 11/03/2022    Significant Diagnostic Results in last 30 days:  No results found.  Assessment/Plan Hypertension Hypertension with episodes of elevated blood pressure, previously managed with losartan . Current blood pressure readings show improvement from earlier in the day. No acute symptoms such as chest pain or blurry vision, which are reassuring. Persistent wheezing not significantly improved. Discussed potential benefit of more frequent inhaler use to manage symptoms. - Restart losartan  for blood pressure management - Monitor blood pressure closely over the weekend - Instruct caregivers to contact on-call provider if symptoms such as chest pain or blurry vision develop  Chronic Hypoxic Respiratory Failure COPD Intermittent hypoxemia with exertion, oxygen saturation dropping to 89% but improving at rest. Concern for adequate oxygenation due to high blood pressure and heart workload. No acute respiratory distress. - Schedule inhaler use twice daily, morning and night - Use albuterol  inhaler as needed before therapy sessions - Schedule inhaler use twice daily, morning and night - Use albuterol  inhaler as needed before therapy sessions  CHF Mild peripheral edema with recent weight increase from 93.2 to 94 pounds. Current Lasix  regimen based on weight gain may need adjustment due to her low body weight. - Adjust Lasix  parameters to account for her low body weight - Administer Lasix  dose tomorrow morning  Family/ staff Communication: nursing  Labs/tests ordered:  none

## 2024-02-20 ENCOUNTER — Encounter: Payer: Self-pay | Admitting: Student

## 2024-02-20 MED ORDER — APIXABAN 2.5 MG PO TABS: 5.0000 mg | ORAL_TABLET | Freq: Two times a day (BID) | ORAL | Status: DC

## 2024-03-03 LAB — BASIC METABOLIC PANEL WITH GFR
BUN: 22 — AB (ref 4–21)
CO2: 31 — AB (ref 13–22)
Chloride: 108 (ref 99–108)
Creatinine: 0.9 (ref 0.5–1.1)
Glucose: 80
Potassium: 3.7 meq/L (ref 3.5–5.1)
Sodium: 143 (ref 137–147)

## 2024-03-03 LAB — COMPREHENSIVE METABOLIC PANEL WITH GFR
Calcium: 8.6 — AB (ref 8.7–10.7)
eGFR: 62

## 2024-04-08 ENCOUNTER — Other Ambulatory Visit: Payer: Self-pay

## 2024-04-08 DIAGNOSIS — R0602 Shortness of breath: Secondary | ICD-10-CM

## 2024-04-11 ENCOUNTER — Ambulatory Visit: Admitting: Pulmonary Disease

## 2024-04-11 ENCOUNTER — Encounter: Payer: Self-pay | Admitting: Pulmonary Disease

## 2024-04-11 VITALS — BP 152/80 | HR 61 | Temp 97.6°F | Ht 60.0 in | Wt 97.6 lb

## 2024-04-11 DIAGNOSIS — Z87891 Personal history of nicotine dependence: Secondary | ICD-10-CM

## 2024-04-11 DIAGNOSIS — I480 Paroxysmal atrial fibrillation: Secondary | ICD-10-CM

## 2024-04-11 DIAGNOSIS — J449 Chronic obstructive pulmonary disease, unspecified: Secondary | ICD-10-CM | POA: Diagnosis not present

## 2024-04-11 DIAGNOSIS — I5020 Unspecified systolic (congestive) heart failure: Secondary | ICD-10-CM | POA: Diagnosis not present

## 2024-04-11 DIAGNOSIS — R54 Age-related physical debility: Secondary | ICD-10-CM

## 2024-04-11 DIAGNOSIS — R0602 Shortness of breath: Secondary | ICD-10-CM | POA: Diagnosis not present

## 2024-04-11 LAB — PULMONARY FUNCTION TEST
DL/VA % pred: 45 %
DL/VA: 1.93 ml/min/mmHg/L
DLCO unc % pred: 26 %
DLCO unc: 4.17 ml/min/mmHg
FEF 25-75 Post: 0.17 L/s
FEF 25-75 Pre: 0.19 L/s
FEF2575-%Change-Post: -12 %
FEF2575-%Pred-Post: 23 %
FEF2575-%Pred-Pre: 27 %
FEV1-%Change-Post: -6 %
FEV1-%Pred-Post: 35 %
FEV1-%Pred-Pre: 38 %
FEV1-Post: 0.44 L
FEV1-Pre: 0.47 L
FEV1FVC-%Change-Post: -2 %
FEV1FVC-%Pred-Pre: 56 %
FEV6-%Change-Post: -5 %
FEV6-%Pred-Post: 66 %
FEV6-%Pred-Pre: 70 %
FEV6-Post: 1.06 L
FEV6-Pre: 1.11 L
FEV6FVC-%Change-Post: 0 %
FEV6FVC-%Pred-Post: 102 %
FEV6FVC-%Pred-Pre: 103 %
FVC-%Change-Post: -4 %
FVC-%Pred-Post: 64 %
FVC-%Pred-Pre: 67 %
FVC-Post: 1.11 L
FVC-Pre: 1.16 L
Post FEV1/FVC ratio: 40 %
Post FEV6/FVC ratio: 95 %
Pre FEV1/FVC ratio: 41 %
Pre FEV6/FVC Ratio: 96 %
RV % pred: 226 %
RV: 5.36 L
TLC % pred: 151 %
TLC: 6.74 L

## 2024-04-11 NOTE — Progress Notes (Signed)
 Full PFT completed today ? ?

## 2024-04-11 NOTE — Progress Notes (Signed)
 Subjective:    [name], female    DOB: Nov 24, 1932, 89 y.o.   MRN: [id number]  Patient Care Team: Abdul Fine, MD as PCP - General [facility])  Chief Complaint  Patient presents with   Shortness of Breath    Shortness of breath on exertion.     BACKGROUND/INTERVAL: 89 y.o. former smoker with medical history significant of HTN, HLD, PAF on Eliquis , COPD, hypothyroidism, admitted to [facility] between 28 October 2023 and 31 October 2023 for atrial fibrillation with RVR.  She also had decompensation of systolic heart failure.  Elevated troponins at that time, B-natriuretic peptide 1228 consistent with CHF, echocardiogram showing LVEF of 35 to 40% a significant decline from her echocardiogram of 23 March.  Presents for evaluation of pulmonary function.  History of COPD with FEV1 unknown and no prior need for inhalers until recent admissions to [facility].  Was discharged on oxygen at 2 L/min due to acute respiratory failure with hypoxia and hypercapnia.  She was initially seen here on 06 November 2023.  Last seen by me on 13 January 2024.  She presents for a scheduled follow-up.  She had PFTs today.  HPI Discussed the use of AI scribe software for clinical note transcription with the patient, who gave verbal consent to proceed.  History of Present Illness   Angela Mason is an 89 year old female with severe COPD who presents for evaluation of her respiratory status.  She presents with her friend Elveria.  She resides in assisted living.  She has severe COPD and describes her breathing as 'okay'. She does not use oxygen at night. Her current inhaler regimen includes Dulera  as a maintenance inhaler and albuterol  as a rescue inhaler, but she does not notice a difference in her breathing with these inhalers and is unsure when to use the albuterol  as needed.  Her lung function is approximately 38 to 40 percent. Despite these challenges, she manages her daily activities independently,  including doing her own laundry and attending meals and activities. She describes herself as a slow and cautious walker.  She lives in a setting where she receives assistance with cleaning and has supervision during showers to prevent falls. She participates in three meals a day and engages in activities, maintaining a level of independence.     DATA 04/11/2024 PFTs: FEV1 0.47 L or 38% predicted, FVC 1.16 L or 67% predicted, FEV1/FVC 41%, no bronchodilator response.  Lung volumes showed hyperinflation and air trapping.  Diffusion capacity is severely impaired.  This is consistent with severe obstructive lung disease on the basis of emphysema.  Review of Systems A 10 point review of systems was performed and it is as noted above otherwise negative.   Patient Active Problem List   Diagnosis Date Noted   Afib (HCC) 10/28/2023   Myocardial injury 10/17/2023   Acute paranoid reaction (HCC) 09/12/2022   Constipation by delayed colonic transit 08/06/2022   Pedal edema 08/06/2022   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) 05/12/2022   HLD (hyperlipidemia) 04/30/2022   Hypothyroidism 04/30/2022   Other symptoms and signs involving cognitive functions and awareness 03/03/2022   History of falling 02/01/2022   Muscle weakness (generalized) 02/01/2022   Need for assistance with personal care 02/01/2022   Other abnormalities of gait and mobility 02/01/2022   Other specified fracture of left pubis, subsequent encounter for fracture with routine healing 02/01/2022   Personal history of other malignant neoplasm of skin 02/01/2022  Protein-calorie malnutrition, severe 01/29/2022   Closed fracture of superior ramus of left pubis (HCC) 01/27/2022   Leukocytosis 01/27/2022   COPD (chronic obstructive pulmonary disease) (HCC) 01/27/2022   MDD (major depressive disorder), recurrent episode, moderate (HCC) 07/31/2021   Insomnia 07/31/2021    History of delirium 03/05/2021   Unspecified macular degeneration 10/16/2020   Severe major depression, single episode, with psychotic features (HCC) 10/12/2020   UTI (urinary tract infection) 10/04/2020   HTN (hypertension)    Hyponatremia    Underweight 08/08/2020   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 08/04/2018   Cystocele, midline 05/05/2017   Nocturia 10/07/2016   Unstable bladder 10/07/2016   Vaginal atrophy 01/23/2016   Procidentia of uterus 11/01/2015    Social History   Tobacco Use   Smoking status: Former    Current packs/day: 0.00    Types: Cigarettes    Quit date: 09/05/2005    Years since quitting: 18.6   Smokeless tobacco: Never  Substance Use Topics   Alcohol  use: Not Currently    Allergies  Allergen Reactions   Gentamicin    Levofloxacin Diarrhea    Current Meds  Medication Sig   albuterol  (VENTOLIN  HFA) 108 (90 Base) MCG/ACT inhaler Inhale 2 puffs into the lungs every 4 (four) hours as needed for wheezing or shortness of breath.   amiodarone  (PACERONE ) 200 MG tablet Take 200 mg by mouth daily.   apixaban  (ELIQUIS ) 2.5 MG TABS tablet Take 2 tablets (5 mg total) by mouth 2 (two) times daily.   ARIPiprazole  (ABILIFY ) 2 MG tablet Take 1 tablet (2 mg total) by mouth daily with breakfast.   bismuth subsalicylate (PEPTO BISMOL) 262 MG/15ML suspension Take 30 mLs by mouth as needed.   dapagliflozin  propanediol (FARXIGA ) 10 MG TABS tablet Take 1 tablet (10 mg total) by mouth daily.   furosemide  (LASIX ) 20 MG tablet Take 20 mg by mouth as needed (for weight gain of 2 lbs over night or 5 lbs in a week, give potassium 10 meq if giving furosemide  and notify MD).   losartan  (COZAAR ) 50 MG tablet Take 50 mg by mouth daily.   metoprolol  succinate (TOPROL -XL) 25 MG 24 hr tablet Take 0.5 tablets (12.5 mg total) by mouth 2 (two) times daily.   mometasone -formoterol  (DULERA ) 100-5 MCG/ACT AERO Inhale 2 puffs into the lungs 2 (two) times daily.    Multiple Vitamins-Minerals (PRESERVISION AREDS) TABS Take 1 capsule by mouth 2 (two) times daily.   Omega-3 Fatty Acids (FISH OIL CONCENTRATE PO) Take 1 tablet by mouth daily.    ondansetron  (ZOFRAN ) 4 MG tablet Take 4 mg by mouth as needed for nausea or vomiting.   OXYGEN 2 lpm for dyspnea or SOB   polyethylene glycol powder (GLYCOLAX/MIRALAX) 17 GM/SCOOP powder Take by mouth once. 0.5 scoop one time a day every Mon, Wed, Fri   Polyvinyl Alcohol -Povidone PF (REFRESH) 1.4-0.6 % SOLN Place 1 drop into both eyes at bedtime.   pravastatin  (PRAVACHOL ) 20 MG tablet Take 1 tablet (20 mg total) by mouth daily at 6 PM.    Immunization History  Administered Date(s) Administered   INFLUENZA, HIGH DOSE SEASONAL PF 05/21/2017, 05/07/2022   Influenza-Unspecified 05/09/2021, 05/15/2023   PFIZER SARS-COV-2 Pediatric Vaccination 5-80yrs 08/09/2019, 09/06/2019, 06/12/2020   PNEUMOCOCCAL CONJUGATE-20 02/11/2022   PPD Test 10/16/2020, 10/26/2020, 02/01/2022, 02/10/2022   Pneumococcal Conjugate-13 08/08/2018   Zoster Recombinant(Shingrix) 02/05/2023, 06/10/2023        Objective:     BP (!) 152/80   Pulse 61  Temp 97.6 F (36.4 C) (Temporal)   Ht 5' (1.524 m)   Wt 97 lb 9.6 oz (44.3 kg)   SpO2 96%   BMI 19.06 kg/m   SpO2: 96 %  GENERAL: Very frail appearing elderly woman, awake and alert, very responsive.  Ambulatory with assistance of a walker.  No respiratory distress on room air. HEAD: Normocephalic, atraumatic.  EYES: Pupils equal, round, reactive to light.  No scleral icterus.  MOUTH: Poor dentition, oral mucosa moist.  No thrush NECK: Supple. No thyromegaly. Trachea midline. No JVD.  No adenopathy. PULMONARY: Good air entry bilaterally.  No adventitious sounds. CARDIOVASCULAR: S1 and S2. Regular rate and rhythm with occasional extrasystoles.  No rubs murmurs gallops heard. ABDOMEN: Benign. MUSCULOSKELETAL: No joint deformity, no clubbing, 1+ edema of the lower extremities wearing  compression stockings.  NEUROLOGIC: No overt focal deficit.  Mild psychomotor retardation. SKIN: Intact,warm,dry. PSYCH: Interacting appropriately, normal mood and behavior.  Recent Results (from the past 2160 hours)  Pulmonary function test     Status: None   Collection Time: 04/11/24 10:08 AM  Result Value Ref Range   FVC-Pre 1.16 L   FVC-%Pred-Pre 67 %   FVC-Post 1.11 L   FVC-%Pred-Post 64 %   FVC-%Change-Post -4 %   FEV1-Pre 0.47 L   FEV1-%Pred-Pre 38 %   FEV1-Post 0.44 L   FEV1-%Pred-Post 35 %   FEV1-%Change-Post -6 %   FEV6-Pre 1.11 L   FEV6-%Pred-Pre 70 %   FEV6-Post 1.06 L   FEV6-%Pred-Post 66 %   FEV6-%Change-Post -5 %   Pre FEV1/FVC ratio 41 %   FEV1FVC-%Pred-Pre 56 %   Post FEV1/FVC ratio 40 %   FEV1FVC-%Change-Post -2 %   Pre FEV6/FVC Ratio 96 %   FEV6FVC-%Pred-Pre 103 %   Post FEV6/FVC ratio 95 %   FEV6FVC-%Pred-Post 102 %   FEV6FVC-%Change-Post 0 %   FEF 25-75 Pre 0.19 L/sec   FEF2575-%Pred-Pre 27 %   FEF 25-75 Post 0.17 L/sec   FEF2575-%Pred-Post 23 %   FEF2575-%Change-Post -12 %   RV 5.36 L   RV % pred 226 %   TLC 6.74 L   TLC % pred 151 %   DLCO unc 4.17 ml/min/mmHg   DLCO unc % pred 26 %   DL/VA 8.06 ml/min/mmHg/L   DL/VA % pred 45 %  *Discussed PFTs with patient.    Assessment & Plan:     ICD-10-CM   1. Stage 3 severe COPD by GOLD classification (HCC)  J44.9     2. Systolic CHF with reduced left ventricular function, NYHA class 3 (HCC)  I50.20     3. Paroxysmal atrial fibrillation (HCC)  I48.0     4. Frailty  R54      Discussion:    Severe chronic obstructive pulmonary disease (COPD) Severe COPD with lung function at 38-40%. Currently using Dulera  and albuterol  inhalers. Reports no noticeable difference with inhaler use. Exhibits air trapping, leading to dyspnea. Despite severe COPD, she is compensated and managing well with daily activities. Discussion about switching to a stronger medication due to lung function, but decided  against it to avoid complicating her regimen and potential side effects. - Continue current inhaler regimen with Dulera  and albuterol  as needed. - Reassess in four months.      Advised if symptoms do not improve or worsen, to please contact office for sooner follow up or seek emergency care.    I spent 30 minutes of dedicated to the care of this patient on the date  of this encounter to include pre-visit review of records, face-to-face time with the patient discussing conditions above, post visit ordering of testing, clinical documentation with the electronic health record, making appropriate referrals as documented, and communicating necessary findings to members of the patients care team.     C. Leita Sanders, MD Advanced Bronchoscopy PCCM [facility]    *This note was generated using voice recognition software/Dragon and/or AI transcription program.  Despite best efforts to proofread, errors can occur which can change the meaning. Any transcriptional errors that result from this process are unintentional and may not be fully corrected at the time of dictation.

## 2024-04-11 NOTE — Patient Instructions (Addendum)
 VISIT SUMMARY:  Today, we discussed your respiratory status and management of your severe COPD. You mentioned that your breathing is 'okay' and that you are managing your daily activities independently, despite your condition.  YOUR PLAN:  -SEVERE CHRONIC OBSTRUCTIVE PULMONARY DISEASE (COPD): Severe COPD means that your lung function is significantly reduced, making it harder to breathe. You are currently using Dulera  as a maintenance inhaler and albuterol  as a rescue inhaler. Although you do not notice a significant difference with these inhalers, it is important to continue using them as prescribed. We discussed the possibility of switching to a stronger medication, but decided against it to avoid complicating your regimen and potential side effects. We will reassess your condition in four months.  INSTRUCTIONS:  Please continue using your Dulera  inhaler daily and your albuterol  inhaler as needed. We will reassess your condition in four months.

## 2024-04-11 NOTE — Patient Instructions (Signed)
 Full PFT completed today ? ?

## 2024-04-14 ENCOUNTER — Ambulatory Visit: Payer: Self-pay | Admitting: Pulmonary Disease

## 2024-04-30 ENCOUNTER — Encounter: Payer: Self-pay | Admitting: Pulmonary Disease

## 2024-05-11 ENCOUNTER — Ambulatory Visit: Admitting: Obstetrics & Gynecology

## 2024-05-17 ENCOUNTER — Ambulatory Visit: Admitting: Obstetrics & Gynecology

## 2024-05-18 ENCOUNTER — Ambulatory Visit: Admitting: Obstetrics & Gynecology

## 2024-05-26 ENCOUNTER — Non-Acute Institutional Stay: Payer: Self-pay | Admitting: Nurse Practitioner

## 2024-05-26 ENCOUNTER — Encounter: Payer: Self-pay | Admitting: Nurse Practitioner

## 2024-05-26 DIAGNOSIS — K5901 Slow transit constipation: Secondary | ICD-10-CM

## 2024-05-26 DIAGNOSIS — F323 Major depressive disorder, single episode, severe with psychotic features: Secondary | ICD-10-CM

## 2024-05-26 DIAGNOSIS — I480 Paroxysmal atrial fibrillation: Secondary | ICD-10-CM

## 2024-05-26 DIAGNOSIS — E43 Unspecified severe protein-calorie malnutrition: Secondary | ICD-10-CM | POA: Diagnosis not present

## 2024-05-26 DIAGNOSIS — I10 Essential (primary) hypertension: Secondary | ICD-10-CM

## 2024-05-26 DIAGNOSIS — I509 Heart failure, unspecified: Secondary | ICD-10-CM | POA: Diagnosis not present

## 2024-05-26 DIAGNOSIS — J449 Chronic obstructive pulmonary disease, unspecified: Secondary | ICD-10-CM

## 2024-05-26 DIAGNOSIS — E785 Hyperlipidemia, unspecified: Secondary | ICD-10-CM

## 2024-05-26 DIAGNOSIS — N952 Postmenopausal atrophic vaginitis: Secondary | ICD-10-CM

## 2024-05-26 NOTE — Progress Notes (Signed)
 Location:  Other [facility].  Nursing Home Room Number: [place] [id number] Place of Service:  ALF [phone]) Harlene An, NP  PCP: Laurence Locus, DO  Patient Care Team: Laurence Locus, DO as PCP - General (Internal Medicine)  Extended Emergency Contact Information Primary Emergency Contact: Cumbee,Carolyn Address: [address] (neighbor - 100 yards away)          [place], [place] [address] [place] Phone: [phone] Relation: Friend Secondary Emergency Contact: Obey,[name] Address: Note:  my last name is OBEY, not [name]  [place] Home Phone: [phone] Relation: Daughter  Goals of care: Advanced Directive information    05/26/2024   11:26 AM  Advanced Directives  Does Patient Have a Medical Advance Directive? Yes  Type of Advance Directive Out of facility DNR (pink MOST or yellow form)  Does patient want to make changes to medical advance directive? No - Patient declined     Chief Complaint  Patient presents with   Medical Management of Chronic Issues    Medical Management of Chronic Issues.     HPI:  Pt is a 89 y.o. female seen today for medical management of chronic disease.   The patient has a history of atrial fibrillation and is currently on amiodarone  and Eliquis  5 mg twice daily. She reports no palpitations, chest discomfort, or fast heart rates. There is no unusual bruising or bleeding, although she has a tender hemorrhoid that is not causing pain or bleeding. She is using over-the-counter Preparation H, which has not been very effective. Denies constipation or diarrhea at this time   She uses Dulera , two puffs twice daily. She reports no shortness of breath, cough, or congestion, but acknowledges wheezing, which she is not always aware of. She does not use oxygen and has not been using albuterol . She followed with pulmonary routinely   She is on pravastatin  for hyperlipidemia and tries to maintain a  healthy diet, although she mentions it is not always possible due to her circumstances- living in [place] and meals are prepared for her.   There is no worsening of anxiety or depression, and she denies any hallucinations.   She uses eye drops four times a day and has no itchy eyes or runny nose.  She has a history of using a pessary for vaginal atrophy, which has been removed for some time. She uses a cream for vaginal atrophy and reports no bladder prolapse or issues.  Past Medical History:  Diagnosis Date   Cancer (HCC)    SKIN   COPD, mild (HCC)    Dyspnea    Edema    Heart failure with mildly reduced ejection fraction (HFmrEF) (HCC)    HLD (hyperlipidemia)    HTN (hypertension)    Hypothyroidism    Macular degeneration, bilateral    Paroxysmal atrial fibrillation (HCC)    Varicosities    Past Surgical History:  Procedure Laterality Date   CATARACT EXTRACTION W/PHACO Right 02/03/2017   Procedure: CATARACT EXTRACTION PHACO AND INTRAOCULAR LENS PLACEMENT (IOC);  Surgeon: Jaye Fallow, MD;  Location: [facility] ORS;  Service: Ophthalmology;  Laterality: Right;  US  00:57.2 AP% 18.5 CDE 10.58 Fluid pack lot # [id number]   CATARACT EXTRACTION W/PHACO Left 02/24/2017   Procedure: CATARACT EXTRACTION PHACO AND INTRAOCULAR LENS PLACEMENT (IOC);  Surgeon: Jaye Fallow, MD;  Location: [facility] ORS;  Service: Ophthalmology;  Laterality: Left;  US  01:12 AP% 25.0 CDE 18.03 Fluid pack lot # [id number]   MOHS SURGERY  2015   nose   SALIVARY GLAND SURGERY Left 2012   TONSILLECTOMY  1940    Allergies  Allergen Reactions   Gentamicin    Levofloxacin Diarrhea    Outpatient Encounter Medications as of 05/26/2024  Medication Sig   acetaminophen  (TYLENOL ) 325 MG tablet Take 650 mg by mouth every 4 (four) hours as needed.   albuterol  (VENTOLIN  HFA) 108 (90 Base) MCG/ACT inhaler Inhale 2 puffs into the lungs every 4 (four) hours as needed for wheezing or shortness of breath.   alum & mag  hydroxide-simeth (MAALOX/MYLANTA) 200-200-20 MG/5 SUSP 2 tbsp by mouth every 4 hours as needed for gas, indigestion, or upset stomach   amiodarone  (PACERONE ) 200 MG tablet Take 200 mg by mouth daily.   apixaban  (ELIQUIS ) 2.5 MG TABS tablet Take 2 tablets (5 mg total) by mouth 2 (two) times daily.   ARIPiprazole  (ABILIFY ) 2 MG tablet Take 1 tablet (2 mg total) by mouth daily with breakfast.   bismuth subsalicylate (PEPTO BISMOL) 262 MG/15ML suspension Take 30 mLs by mouth as needed.   carbamide peroxide (DEBROX) 6.5 % OTIC solution 5 drops as needed.   cetirizine (ZYRTEC) 5 MG tablet Take 5 mg by mouth as needed for allergies.   dapagliflozin  propanediol (FARXIGA ) 10 MG TABS tablet Take 1 tablet (10 mg total) by mouth daily.   estradiol  (ESTRACE ) 0.1 MG/GM vaginal cream Place 1 Applicatorful vaginally 2 (two) times a week.   furosemide  (LASIX ) 20 MG tablet Take 10 mg by mouth daily.   Glucose 15 GM/32ML GEL Take 1 packet by mouth as needed (for low blood sugar).   guaifenesin  (ROBITUSSIN) 100 MG/5ML syrup 2 tsp by mouth every 4 hours as needed for coughing   losartan  (COZAAR ) 50 MG tablet Take 50 mg by mouth daily.   magnesium  hydroxide (MILK OF MAGNESIA) 400 MG/5ML suspension 2 tsp by mouth as needed for constipation   metoprolol  succinate (TOPROL -XL) 25 MG 24 hr tablet Take 0.5 tablets (12.5 mg total) by mouth 2 (two) times daily.   mometasone -formoterol  (DULERA ) 100-5 MCG/ACT AERO Inhale 2 puffs into the lungs 2 (two) times daily.   Multiple Vitamins-Minerals (PRESERVISION AREDS) TABS Take 1 capsule by mouth 2 (two) times daily.   nystatin  (MYCOSTATIN /NYSTOP ) powder Apply 1 Application topically 2 (two) times daily.   Omega-3 Fatty Acids (FISH OIL CONCENTRATE PO) Take 1 tablet by mouth daily.    ondansetron  (ZOFRAN ) 4 MG tablet Take 4 mg by mouth as needed for nausea or vomiting.   OXYGEN 2 lpm for dyspnea or SOB   polyethylene glycol powder (GLYCOLAX/MIRALAX) 17 GM/SCOOP powder Take by mouth  once. 0.5 scoop one time a day every Mon, Wed, Fri   Polyvinyl Alcohol -Povidone PF (REFRESH) 1.4-0.6 % SOLN Place 1 drop into both eyes at bedtime.   pravastatin  (PRAVACHOL ) 20 MG tablet Take 1 tablet (20 mg total) by mouth daily at 6 PM.   potassium chloride  (KLOR-CON  M) 10 MEQ tablet Take 10 mEq by mouth as needed. Give if giving furosemide  for weight gain (Patient not taking: Reported on 05/26/2024)   No facility-administered encounter medications on file as of 05/26/2024.    Review of Systems  Constitutional:  Negative for activity change, appetite change, fatigue and unexpected weight change.  HENT:  Negative for congestion and hearing loss.   Eyes: Negative.   Respiratory:  Negative for cough and shortness of breath.   Cardiovascular:  Negative for chest pain, palpitations and leg swelling.  Gastrointestinal:  Negative for abdominal pain, constipation  and diarrhea.  Genitourinary:  Negative for difficulty urinating and dysuria.  Musculoskeletal:  Negative for arthralgias and myalgias.  Skin:  Negative for color change and wound.  Neurological:  Negative for dizziness and weakness.  Psychiatric/Behavioral:  Negative for agitation, behavioral problems and confusion.      Immunization History  Administered Date(s) Administered   INFLUENZA, HIGH DOSE SEASONAL PF 05/21/2017, 05/07/2022   Influenza-Unspecified 05/09/2021, 05/15/2023   PFIZER SARS-COV-2 Pediatric Vaccination 5-22yrs 08/09/2019, 09/06/2019, 06/12/2020   PNEUMOCOCCAL CONJUGATE-20 02/11/2022   PPD Test 10/16/2020, 10/26/2020, 02/01/2022, 02/10/2022   Pneumococcal Conjugate-13 08/08/2018   Zoster Recombinant(Shingrix) 02/05/2023, 06/10/2023   Pertinent  Health Maintenance Due  Topic Date Due   DEXA SCAN  Never done   Influenza Vaccine  02/26/2024      09/19/2022    2:02 PM 11/14/2022    1:17 PM 01/14/2023    1:48 PM 01/30/2023    3:20 PM 03/03/2023    3:50 PM  Fall Risk  Falls in the past year? 0 0 0 0 0  Was there  an injury with Fall? 0 0 0 0 0  Fall Risk Category Calculator 0 0 0 0 0  Patient at Risk for Falls Due to No Fall Risks   No Fall Risks Impaired balance/gait  Fall risk Follow up Falls evaluation completed   Falls evaluation completed Falls evaluation completed   Functional Status Survey:    Vitals:   05/26/24 1117 05/26/24 1328  BP: (!) 178/83 (!) 127/57  Pulse: 66   Resp: 16   Temp: 98.2 F (36.8 C)   SpO2: (!) 87% 94%  Weight: 95 lb 9.6 oz (43.4 kg)   Height: 5' (1.524 m)    Body mass index is 18.67 kg/m. Physical Exam Constitutional:      General: She is not in acute distress.    Appearance: She is well-developed. She is not diaphoretic.  HENT:     Head: Normocephalic and atraumatic.     Mouth/Throat:     Pharynx: No oropharyngeal exudate.  Eyes:     Conjunctiva/sclera: Conjunctivae normal.     Pupils: Pupils are equal, round, and reactive to light.  Cardiovascular:     Rate and Rhythm: Normal rate and regular rhythm.     Heart sounds: Normal heart sounds.  Pulmonary:     Effort: Pulmonary effort is normal.     Breath sounds: Wheezing (scattered wheezing throughout) present.  Abdominal:     General: Bowel sounds are normal.     Palpations: Abdomen is soft.  Musculoskeletal:     Cervical back: Normal range of motion and neck supple.     Right lower leg: No edema.     Left lower leg: No edema.  Skin:    General: Skin is warm and dry.  Neurological:     Mental Status: She is alert.  Psychiatric:        Mood and Affect: Mood normal.     Labs reviewed: Recent Labs    10/17/23 1638 10/17/23 1930 10/29/23 0321 10/30/23 0429 10/31/23 0606 03/03/24 0000  NA  --    < > 139 135 139 143  K  --    < > 3.7 3.1* 4.3 3.7  CL  --    < > 97* 92* 99 108  CO2  --    < > 35* 35* 34* 31*  GLUCOSE  --    < > 151* 129* 94  --   BUN  --    < >  34* 36* 37* 22*  CREATININE  --    < > 1.02* 0.93 0.83 0.9  CALCIUM   --    < > 8.1* 8.2* 8.5* 8.6*  MG 2.3  --   --   --   2.8*  --   PHOS 3.2  --   --   --   --   --    < > = values in this interval not displayed.   Recent Labs    07/02/23 0000 10/17/23 1502  AST 18 30  ALT 13 39  ALKPHOS 76 70  BILITOT  --  0.9  PROT  --  6.4*  ALBUMIN 4.0 3.2*   Recent Labs    07/02/23 0000 10/17/23 1502 10/20/23 0600 10/26/23 0000 10/28/23 0500 10/29/23 0321  WBC 7.5   < > 11.3* 8.0 13.3* 9.9  NEUTROABS 6,165.00  --   --  6,304.00 10.0*  --   HGB 11.7*   < > 10.3* 11.0* 13.0 9.5*  HCT 36   < > 30.5* 35* 42.6 29.7*  MCV  --    < > 97.1  --  105.7* 101.4*  PLT 252   < > 231 260 298 187   < > = values in this interval not displayed.   Lab Results  Component Value Date   TSH 0.968 10/17/2023   Lab Results  Component Value Date   HGBA1C 5.6 11/03/2022   Lab Results  Component Value Date   CHOL 195 07/02/2023   HDL 68 07/02/2023   LDLCALC 108 07/02/2023   TRIG 101 11/03/2022    Significant Diagnostic Results in last 30 days:  No results found.  Assessment/Plan Vaginal atrophy Vaginal atrophy managed with vaginal cream.  - Continue use of vaginal cream.  Severe major depression, single episode, with psychotic features (HCC) Mood has been well controlled on abilify    Protein-calorie malnutrition, severe Has had positive weight gain. Continue routine meals and supplements   HTN (hypertension) Blood pressure high at times but comes down on recheck To continue current regimen.   HLD (hyperlipidemia) Hyperlipidemia managed with pravastatin . Limited dietary control. - Order lipid panel recheck.  COPD (chronic obstructive pulmonary disease) (HCC) Severe COPD. Managed with Dulera  and albuterol  inhaler. - Use albuterol  inhaler as needed for wheezing or shortness of breath. - Continue Dulera  two puffs twice daily. - Follow up with pulmonologist as scheduled.  Constipation by delayed colonic transit Well controlled on current regimen. To aoid straining due to hemorrhoids   Afib  (HCC) Chronic atrial fibrillation managed with amiodarone  and Eliquis . No palpitations, chest discomfort, or tachycardia. - Continue amiodarone  and Eliquis  as prescribed.      Kalief Kattner K. Caro BODILY [facility] [phone]

## 2024-05-29 NOTE — Assessment & Plan Note (Signed)
 Vaginal atrophy managed with vaginal cream.  - Continue use of vaginal cream.

## 2024-05-29 NOTE — Assessment & Plan Note (Signed)
 Blood pressure high at times but comes down on recheck To continue current regimen.

## 2024-05-29 NOTE — Assessment & Plan Note (Signed)
 Chronic atrial fibrillation managed with amiodarone  and Eliquis . No palpitations, chest discomfort, or tachycardia. - Continue amiodarone  and Eliquis  as prescribed.

## 2024-05-29 NOTE — Assessment & Plan Note (Signed)
 Severe COPD. Managed with Dulera  and albuterol  inhaler. - Use albuterol  inhaler as needed for wheezing or shortness of breath. - Continue Dulera  two puffs twice daily. - Follow up with pulmonologist as scheduled.

## 2024-05-29 NOTE — Assessment & Plan Note (Signed)
 Hyperlipidemia managed with pravastatin . Limited dietary control. - Order lipid panel recheck.

## 2024-05-29 NOTE — Assessment & Plan Note (Signed)
 Well controlled on current regimen. To aoid straining due to hemorrhoids

## 2024-05-29 NOTE — Assessment & Plan Note (Signed)
 Has had positive weight gain. Continue routine meals and supplements

## 2024-05-29 NOTE — Assessment & Plan Note (Signed)
 Mood has been well controlled on abilify 

## 2024-05-30 LAB — CBC AND DIFFERENTIAL
HCT: 40 (ref 36–46)
Hemoglobin: 12.9 (ref 12.0–16.0)
Neutrophils Absolute: 6099
Platelets: 233 10*3/uL (ref 150–400)
WBC: 7.9

## 2024-05-30 LAB — HEPATIC FUNCTION PANEL
ALT: 58 U/L — AB (ref 7–35)
AST: 41 — AB (ref 13–35)
Alkaline Phosphatase: 91 (ref 25–125)
Bilirubin, Total: 0.7

## 2024-05-30 LAB — BASIC METABOLIC PANEL WITH GFR
BUN: 22 — AB (ref 4–21)
CO2: 33 — AB (ref 13–22)
Chloride: 100 (ref 99–108)
Creatinine: 0.9 (ref 0.5–1.1)
Glucose: 149
Potassium: 4.1 meq/L (ref 3.5–5.1)
Sodium: 139 (ref 137–147)

## 2024-05-30 LAB — COMPREHENSIVE METABOLIC PANEL WITH GFR
Albumin: 4.3 (ref 3.5–5.0)
Calcium: 9.5 (ref 8.7–10.7)
Globulin: 2.6
eGFR: 58

## 2024-05-30 LAB — LIPID PANEL
Cholesterol: 199 (ref 0–200)
HDL: 65 (ref 35–70)
LDL Cholesterol: 112
Triglycerides: 114 (ref 40–160)

## 2024-05-30 LAB — CBC: RBC: 4.09 (ref 3.87–5.11)

## 2024-05-31 ENCOUNTER — Encounter: Payer: Self-pay | Admitting: Obstetrics & Gynecology

## 2024-05-31 ENCOUNTER — Ambulatory Visit: Admitting: Obstetrics & Gynecology

## 2024-05-31 ENCOUNTER — Ambulatory Visit (INDEPENDENT_AMBULATORY_CARE_PROVIDER_SITE_OTHER): Admitting: Obstetrics & Gynecology

## 2024-05-31 VITALS — BP 163/80 | HR 61 | Ht 60.0 in | Wt 98.3 lb

## 2024-05-31 DIAGNOSIS — N813 Complete uterovaginal prolapse: Secondary | ICD-10-CM | POA: Diagnosis not present

## 2024-05-31 NOTE — Progress Notes (Signed)
    GYNECOLOGY PROGRESS NOTE  Subjective:    [name], female    DOB: 07/25/1933, 89 y.o.   MRN: [id number]  HPI  Patient is a 89 y.o. G1P1001 here from [facility] facility to have her pessary placed. At her last visit here for pessary maintenance she opted to leave the pessary out. However over the last 3 months the bulging bladder and uterus have bothered her. She opts to have the pessary replaced. She is using vaginal estrogen about 1-2 times per week.  The following portions of the patient's history were reviewed and updated as appropriate: allergies, current medications, past family history, past medical history, past social history, past surgical history, and problem list.  Review of Systems Pertinent items are noted in HPI.   Objective:   Blood pressure (!) 163/80, pulse 61, height 5' (1.524 m), weight 98 lb 4.8 oz (44.6 kg). Body mass index is 19.2 kg/m. Well nourished, well hydrated White female, no apparent distress She is ambulating with a walker and conversing normally (although there may be some mild cognitive impairment).   Assessment:   1. 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      Plan:   1. 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 (Primary) - Her 2 1/4 inch ring with support pessary was placed in the vagina. - She will come back q 3 months for pessary maintenance or sooner as necessary.

## 2024-07-13 ENCOUNTER — Emergency Department

## 2024-07-13 ENCOUNTER — Other Ambulatory Visit: Payer: Self-pay

## 2024-07-13 ENCOUNTER — Encounter: Payer: Self-pay | Admitting: Emergency Medicine

## 2024-07-13 ENCOUNTER — Emergency Department
Admission: EM | Admit: 2024-07-13 | Discharge: 2024-07-13 | Disposition: A | Discharge status: Home / Self Care | Source: Home / Self Care

## 2024-07-13 DIAGNOSIS — R0602 Shortness of breath: Secondary | ICD-10-CM | POA: Diagnosis present

## 2024-07-13 DIAGNOSIS — J441 Chronic obstructive pulmonary disease with (acute) exacerbation: Secondary | ICD-10-CM | POA: Diagnosis not present

## 2024-07-13 DIAGNOSIS — J069 Acute upper respiratory infection, unspecified: Secondary | ICD-10-CM | POA: Diagnosis not present

## 2024-07-13 DIAGNOSIS — R600 Localized edema: Secondary | ICD-10-CM | POA: Insufficient documentation

## 2024-07-13 LAB — CBC
HCT: 40.6 % (ref 36.0–46.0)
Hemoglobin: 13.1 g/dL (ref 12.0–15.0)
MCH: 32.2 pg (ref 26.0–34.0)
MCHC: 32.3 g/dL (ref 30.0–36.0)
MCV: 99.8 fL (ref 80.0–100.0)
Platelets: 203 K/uL (ref 150–400)
RBC: 4.07 MIL/uL (ref 3.87–5.11)
RDW: 14.9 % (ref 11.5–15.5)
WBC: 10.7 K/uL — ABNORMAL HIGH (ref 4.0–10.5)
nRBC: 0 % (ref 0.0–0.2)

## 2024-07-13 LAB — BASIC METABOLIC PANEL WITH GFR
Anion gap: 9 (ref 5–15)
BUN: 22 mg/dL (ref 8–23)
CO2: 31 mmol/L (ref 22–32)
Calcium: 9.7 mg/dL (ref 8.9–10.3)
Chloride: 99 mmol/L (ref 98–111)
Creatinine, Ser: 1 mg/dL (ref 0.44–1.00)
GFR, Estimated: 54 mL/min — ABNORMAL LOW (ref >60)
Glucose, Bld: 112 mg/dL — ABNORMAL HIGH (ref 70–99)
Potassium: 4.2 mmol/L (ref 3.5–5.1)
Sodium: 139 mmol/L (ref 135–145)

## 2024-07-13 LAB — RESP PANEL BY RT-PCR (RSV, FLU A&B, COVID)  RVPGX2
Influenza A by PCR: NEGATIVE
Influenza B by PCR: NEGATIVE
Resp Syncytial Virus by PCR: NEGATIVE
SARS Coronavirus 2 by RT PCR: NEGATIVE

## 2024-07-13 MED ORDER — IPRATROPIUM-ALBUTEROL 0.5-2.5 (3) MG/3ML IN SOLN
3.0000 mL | Freq: Once | RESPIRATORY_TRACT | Status: AC
  Administered 2024-07-13: 10:00:00 3 mL via RESPIRATORY_TRACT
  Filled 2024-07-13: qty 3

## 2024-07-13 MED ORDER — ACETAMINOPHEN 325 MG PO TABS
975.0000 mg | ORAL_TABLET | Freq: Once | ORAL | Status: AC
  Administered 2024-07-13: 10:00:00 975 mg via ORAL
  Filled 2024-07-13: qty 3

## 2024-07-13 MED ORDER — PREDNISONE 10 MG PO TABS: 50.0000 mg | ORAL_TABLET | Freq: Every day | ORAL | 0 refills | Status: AC

## 2024-07-13 MED ORDER — GUAIFENESIN ER 600 MG PO TB12
600.0000 mg | ORAL_TABLET | Freq: Once | ORAL | Status: AC
  Administered 2024-07-13: 10:00:00 600 mg via ORAL
  Filled 2024-07-13: qty 1

## 2024-07-13 NOTE — ED Triage Notes (Signed)
 First Nurse Note;  Pt via [facility] from [facility]. Pt c/o SOB started last night with productive cough. Pt has a hx COPD. EMS gave 1 Duoneb. EMS report 98.9, 66 HR, 95% on RA, 27 RR, 192/87 BP. Pt is A&Ox4 and NAD

## 2024-07-13 NOTE — ED Provider Notes (Signed)
 [facility] Provider Note    Event Date/Time   First MD Initiated Contact with Patient 07/13/24 [phone]     (approximate)   History   Shortness of Breath   HPI  Angela Mason is a 89 y.o. female presenting with concern of cough congestion shortness of breath.  Since last night with rhinorrhea cough congestion.  This morning woke up with increased symptoms.  History of COPD does not require oxygen at baseline.  EMS found her with wheezes and gave her nebulizer and route.  Patient states that she feels symptoms have minimally improved since then.  Multiple sick contacts at the nursing facility that she states that.  States that she has been able to ambulate without causing significant respiratory distress.  She does use an inhaler but has not felt that she has needed it more frequently than normal.  Denies associated chest pain.  Chronic swelling in the lower extremities with no acute changes.     Physical Exam   Triage Vital Signs: ED Triage Vitals  Encounter Vitals Group     BP 07/13/24 0924 (!) 173/92     Girls Systolic BP Percentile --      Girls Diastolic BP Percentile --      Boys Systolic BP Percentile --      Boys Diastolic BP Percentile --      Pulse Rate 07/13/24 0924 75     Resp 07/13/24 0924 (!) 24     Temp 07/13/24 0924 98.2 F (36.8 C)     Temp Source 07/13/24 0924 Oral     SpO2 07/13/24 0924 93 %     Weight 07/13/24 0921 97 lb 6.4 oz (44.2 kg)     Height 07/13/24 0921 4' 10 (1.473 m)     Head Circumference --      Peak Flow --      Pain Score 07/13/24 0921 0     Pain Loc --      Pain Education --      Exclude from Growth Chart --     Most recent vital signs: Vitals:   07/13/24 0957 07/13/24 1203  BP:  (!) 170/85  Pulse:  82  Resp:  18  Temp:  98.3 F (36.8 C)  SpO2: 97% 96%     General: Awake, no distress.  CV:  Good peripheral perfusion.  Bilateral 2+ pitting edema in the lower extremities Resp:  Normal effort.  Able  to speak in full sentences, no noted respiratory distress, diminished breath sounds bilaterally in the upper and lower lobes Abd:  No distention.  Soft nontender Other:     ED Results / Procedures / Treatments   Labs (all labs ordered are listed, but only abnormal results are displayed) Labs Reviewed  BASIC METABOLIC PANEL WITH GFR - Abnormal; Notable for the following components:      Result Value   Glucose, Bld 112 (*)    GFR, Estimated 54 (*)    All other components within normal limits  CBC - Abnormal; Notable for the following components:   WBC 10.7 (*)    All other components within normal limits  RESP PANEL BY RT-PCR (RSV, FLU A&B, COVID)  [id number]     EKG  Sinus rhythm at rate of about 75, axis of about 0, intervals appear to be within normal limits, no obvious ischemia that I appreciate on this EKG   RADIOLOGY  No acute cardiopulmonary process appreciated  PROCEDURES:  Critical Care performed:  No  Procedures   MEDICATIONS ORDERED IN ED: Medications  acetaminophen  (TYLENOL ) tablet 975 mg (975 mg Oral Given 07/13/24 0956)  guaiFENesin  (MUCINEX ) 12 hr tablet 600 mg (600 mg Oral Given 07/13/24 0957)  ipratropium-albuterol  (DUONEB) 0.5-2.5 (3) MG/3ML nebulizer solution 3 mL (3 mLs Nebulization Given 07/13/24 1000)     IMPRESSION / MDM / ASSESSMENT AND PLAN / ED COURSE  I reviewed the triage vital signs and the nursing notes.                               Patient's presentation is most consistent with acute complicated illness / injury requiring diagnostic workup.  89 year old female presenting today with concern of shortness of breath cough congestion.  Congested in exam room, with persistent cough, has diminished bilateral breath sounds.  I suspect likely URI with underlying COPD exacerbation resulting in her symptomatology.  Will follow-up labs viral panel chest x-ray symptomatic management and determine safe disposition accordingly.   Clinical Course as  of 07/13/24 1501  Wed Jul 13, 2024  1040 Patient stating that the breathing has improved.  Chest x-ray without acute findings, vitals remained stable.  Awaiting COVID results, anticipate likely reasonable for discharge home. [SK]  1118 Patient breathing improved, negative for COVID and flu.  Will have her discharged home at this time with a brief course of steroids. [SK]  1200 Patient friend who accompanies her is requesting an FL 2 form, unfortunately this was a 3-day stay which patient likely would not qualify for at this time.  I will have the patient discharged home. [SK]    Clinical Course User Index [SK] Fernand Rossie HERO, MD     FINAL CLINICAL IMPRESSION(S) / ED DIAGNOSES   Final diagnoses:  COPD exacerbation (HCC)  Upper respiratory tract infection, unspecified type     Rx / DC Orders   ED Discharge Orders          Ordered    predniSONE  (DELTASONE ) 10 MG tablet  Daily        07/13/24 1119             Note:  This document was prepared using Dragon voice recognition software and may include unintentional dictation errors.   Fernand Rossie HERO, MD 07/13/24 1501

## 2024-07-13 NOTE — Discharge Instructions (Addendum)
 You were seen today due to a cough and shortness of breath and I suspect your symptoms are likely due to worsening of your COPD as well as a viral infection.  I have written for some steroids for you to take, please take these as instructed.  If you have any worsening of symptoms such as fevers, chills, shortness of breath, or any other symptoms you find concerning please return to the emergency department immediately for further medical management.

## 2024-07-13 NOTE — ED Notes (Signed)
 Pt to XR

## 2024-07-13 NOTE — ED Triage Notes (Signed)
 Pt via [facility] from [facility]. Pt c/o SOB and productive cough since last night. Reports hx of COPD. Does not wear O2 at home. Denies any pain. Expiratory wheezes and productive cough noted during triage. Pt is A&OX4 and NAD.

## 2024-07-13 NOTE — TOC Initial Note (Signed)
 Transition of Care [facility]) - Initial/Assessment Note    Patient Details  Name: Angela Mason MRN: [id number] Date of Birth: [birth year]  [facility]) CM/SW Contact:    Caysie Minnifield L Mafalda Mcginniss, LCSW Phone Number: 07/13/2024, 11:59 AM  Clinical Narrative:                    [facility] consult received for SNF placement. Patient has not been evaluated by PT/OT. Attending advised that the family needs an FL2 so the patient can be admitted into respite care.   TOC consult screened out.      Patient Goals and CMS Choice            Expected Discharge Plan and Services                                              Prior Living Arrangements/Services                       Activities of Daily Living      Permission Sought/Granted                  Emotional Assessment              Admission diagnosis:  sob Patient Active Problem List   Diagnosis Date Noted   Afib (HCC) 10/28/2023   Myocardial injury 10/17/2023   Acute paranoid reaction (HCC) 09/12/2022   Constipation by delayed colonic transit 08/06/2022   Pedal edema 08/06/2022   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) 05/12/2022   HLD (hyperlipidemia) 04/30/2022   Hypothyroidism 04/30/2022   Other symptoms and signs involving cognitive functions and awareness 03/03/2022   History of falling 02/01/2022   Muscle weakness (generalized) 02/01/2022   Need for assistance with personal care 02/01/2022   Other abnormalities of gait and mobility 02/01/2022   Other specified fracture of left pubis, subsequent encounter for fracture with routine healing 02/01/2022   Personal history of other malignant neoplasm of skin 02/01/2022   Protein-calorie malnutrition, severe 01/29/2022   Closed fracture of superior ramus of left pubis (HCC) 01/27/2022   Leukocytosis 01/27/2022   COPD (chronic obstructive pulmonary disease) (HCC) 01/27/2022    MDD (major depressive disorder), recurrent episode, moderate (HCC) 07/31/2021   Insomnia 07/31/2021   History of delirium 03/05/2021   Unspecified macular degeneration 10/16/2020   Severe major depression, single episode, with psychotic features (HCC) 10/12/2020   HTN (hypertension)    Hyponatremia    Underweight 08/08/2020   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 08/04/2018   Cystocele, midline 05/05/2017   Nocturia 10/07/2016   Unstable bladder 10/07/2016   Vaginal atrophy 01/23/2016   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 [place] [address] Phone: [phone] Fax: [phone]     Social Drivers of Health (SDOH) Social History: SDOH Screenings   Food Insecurity: No Food Insecurity (10/28/2023)  Housing: Unknown (12/03/2023)   Received from [facility]  Transportation Needs: No Transportation Needs (10/28/2023)  Utilities: Not At Risk (10/28/2023)  Depression (PHQ2-9): Low Risk (01/14/2023)  Social Connections: Socially Isolated (10/28/2023)  Tobacco Use: Medium Risk (07/13/2024)   SDOH Interventions:     Readmission Risk Interventions  No data to display

## 2024-07-27 ENCOUNTER — Encounter: Payer: Self-pay | Admitting: Orthopedic Surgery

## 2024-07-27 ENCOUNTER — Non-Acute Institutional Stay: Admitting: Orthopedic Surgery

## 2024-07-27 DIAGNOSIS — J441 Chronic obstructive pulmonary disease with (acute) exacerbation: Secondary | ICD-10-CM

## 2024-07-27 DIAGNOSIS — I10 Essential (primary) hypertension: Secondary | ICD-10-CM | POA: Diagnosis not present

## 2024-07-27 DIAGNOSIS — I509 Heart failure, unspecified: Secondary | ICD-10-CM

## 2024-07-27 DIAGNOSIS — I480 Paroxysmal atrial fibrillation: Secondary | ICD-10-CM | POA: Diagnosis not present

## 2024-07-27 MED ORDER — POTASSIUM CHLORIDE CRYS ER 10 MEQ PO TBCR: 10.0000 meq | EXTENDED_RELEASE_TABLET | Freq: Every day | ORAL | Status: AC

## 2024-07-27 NOTE — Progress Notes (Signed)
 " Location:  Other Nursing Home Room Number: [facility] AL207/A Place of Service:  ALF [phone]) Provider:  Greig FORBES Cluster, NP   Laurence Locus, DO  Patient Care Team: Laurence Locus, DO as PCP - General (Internal Medicine)  Extended Emergency Contact Information Primary Emergency Contact: Cumbee,Carolyn Address: [address] (neighbor - 100 yards away)          [place], [place] [address] [place] Phone: [phone] Relation: Friend Secondary Emergency Contact: Obey,[name] Address: Note:  my last name is OBEY, not [name]  [place] Home Phone: [phone] Relation: Daughter  Code Status:  DNR Goals of care: Advanced Directive information    07/13/2024    9:23 AM  Advanced Directives  Does Patient Have a Medical Advance Directive? Yes  Type of Advance Directive Out of facility DNR (pink MOST or yellow form)     Chief Complaint  Patient presents with   Acute Visit    Lower leg edema    HPI:  Pt is a 89 y.o. female seen today for acute visit due to lower leg edema.   She currently resides on the assisted living unit at [facility]. PMH: atrial fib, HTN, HLD, COPD, cystocele/rectocele, hypothyroidism, hyponatremia, insomnia and macular degeneration.   12/17 she has increased shortness of breath and went to the ED for evaluation. CXR negative for infiltrates, decreased right pleural effusion noted. Respiratory panel negative. She was diagnosed with COPD exacerbation. She was given duoneb treatment and discharged with prednisone .   Today, nursing reports increased 2-3+ pitting edema to lower legs. She reports the increased swelling have made her legs more tender to touch. She is taking furosemide  10 mg daily. Denies chest pain, shortness of breath, dry cough or wheezing. Afebrile. Vitals stable.   LVEF 35-40% 10/2023.   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) (HCC)    HLD (hyperlipidemia)    HTN (hypertension)    Hypothyroidism    Macular degeneration, bilateral    Paroxysmal atrial fibrillation (HCC)    Varicosities    Past Surgical History:  Procedure Laterality Date   CATARACT EXTRACTION W/PHACO Right 02/03/2017   Procedure: CATARACT EXTRACTION PHACO AND INTRAOCULAR LENS PLACEMENT (IOC);  Surgeon: Jaye Fallow, MD;  Location: [facility] ORS;  Service: Ophthalmology;  Laterality: Right;  US  00:57.2 AP% 18.5 CDE 10.58 Fluid pack lot # [id number]   CATARACT EXTRACTION W/PHACO Left 02/24/2017   Procedure: CATARACT EXTRACTION PHACO AND INTRAOCULAR LENS PLACEMENT (IOC);  Surgeon: Jaye Fallow, MD;  Location: [facility] ORS;  Service: Ophthalmology;  Laterality: Left;  US  01:12 AP% 25.0 CDE 18.03 Fluid pack lot # [id number]   MOHS SURGERY  2015   nose   SALIVARY GLAND SURGERY Left 2012   TONSILLECTOMY  1940    Allergies[1]  Outpatient Encounter Medications as of 07/27/2024  Medication Sig   acetaminophen  (TYLENOL ) 325 MG tablet Take 650 mg by mouth every 4 (four) hours as needed.   albuterol  (VENTOLIN  HFA) 108 (90 Base) MCG/ACT inhaler Inhale 2 puffs into the lungs every 4 (four) hours as needed for wheezing or shortness of breath.   alum & mag hydroxide-simeth (MAALOX/MYLANTA) 200-200-20 MG/5 SUSP 2 tbsp by mouth every 4 hours as needed for gas, indigestion, or upset stomach   amiodarone  (PACERONE ) 200 MG tablet  Take 200 mg by mouth daily.   apixaban  (ELIQUIS ) 2.5 MG TABS tablet Take 2 tablets (5 mg total) by mouth 2 (two) times daily.   ARIPiprazole  (ABILIFY ) 2 MG tablet Take 1 tablet (2 mg total) by mouth daily with breakfast.   bismuth subsalicylate (PEPTO BISMOL) 262 MG/15ML suspension Take 30 mLs by mouth as needed.   carbamide peroxide (DEBROX) 6.5 % OTIC solution 5 drops as needed.   cetirizine (ZYRTEC) 5 MG tablet Take 5 mg by mouth as needed for allergies.   dapagliflozin   propanediol (FARXIGA ) 10 MG TABS tablet Take 1 tablet (10 mg total) by mouth daily.   estradiol  (ESTRACE ) 0.1 MG/GM vaginal cream Place 1 Applicatorful vaginally 2 (two) times a week. (Patient not taking: Reported on 05/31/2024)   furosemide  (LASIX ) 20 MG tablet Take 10 mg by mouth daily.   Glucose 15 GM/32ML GEL Take 1 packet by mouth as needed (for low blood sugar).   guaifenesin  (ROBITUSSIN) 100 MG/5ML syrup 2 tsp by mouth every 4 hours as needed for coughing   losartan  (COZAAR ) 50 MG tablet Take 50 mg by mouth daily.   magnesium  hydroxide (MILK OF MAGNESIA) 400 MG/5ML suspension 2 tsp by mouth as needed for constipation   metoprolol  succinate (TOPROL -XL) 25 MG 24 hr tablet Take 0.5 tablets (12.5 mg total) by mouth 2 (two) times daily.   mometasone -formoterol  (DULERA ) 100-5 MCG/ACT AERO Inhale 2 puffs into the lungs 2 (two) times daily.   Multiple Vitamins-Minerals (PRESERVISION AREDS) TABS Take 1 capsule by mouth 2 (two) times daily.   nystatin  (MYCOSTATIN /NYSTOP ) powder Apply 1 Application topically 2 (two) times daily.   Omega-3 Fatty Acids (FISH OIL CONCENTRATE PO) Take 1 tablet by mouth daily.    ondansetron  (ZOFRAN ) 4 MG tablet Take 4 mg by mouth as needed for nausea or vomiting.   OXYGEN 2 lpm for dyspnea or SOB   polyethylene glycol powder (GLYCOLAX/MIRALAX) 17 GM/SCOOP powder Take by mouth once. 0.5 scoop one time a day every Mon, Wed, Fri   Polyvinyl Alcohol -Povidone PF (REFRESH) 1.4-0.6 % SOLN Place 1 drop into both eyes at bedtime.   pravastatin  (PRAVACHOL ) 20 MG tablet Take 1 tablet (20 mg total) by mouth daily at 6 PM.   No facility-administered encounter medications on file as of 07/27/2024.    Review of Systems  Constitutional:  Negative for fatigue and fever.  HENT:  Negative for sore throat and trouble swallowing.   Respiratory:  Negative for cough, shortness of breath and wheezing.   Cardiovascular:  Positive for leg swelling.  Gastrointestinal:  Negative for abdominal  distention and abdominal pain.  Genitourinary:  Negative for dysuria and hematuria.  Musculoskeletal:  Positive for gait problem.  Skin:  Negative for wound.  Neurological:  Positive for weakness. Negative for dizziness and headaches.  Psychiatric/Behavioral:  Negative for confusion, dysphoric mood and sleep disturbance. The patient is not nervous/anxious.     Immunization History  Administered Date(s) Administered   INFLUENZA, HIGH DOSE SEASONAL PF 05/21/2017, 05/07/2022   Influenza-Unspecified 05/09/2021, 05/15/2023   PFIZER SARS-COV-2 Pediatric Vaccination 5-76yrs 08/09/2019, 09/06/2019, 06/12/2020   PNEUMOCOCCAL CONJUGATE-20 02/11/2022   PPD Test 10/16/2020, 10/26/2020, 02/01/2022, 02/10/2022   Pneumococcal Conjugate-13 08/08/2018   Zoster Recombinant(Shingrix) 02/05/2023, 06/10/2023   Pertinent  Health Maintenance Due  Topic Date Due   Bone Density Scan  Never done   Influenza Vaccine  02/26/2024      09/19/2022    2:02 PM 11/14/2022    1:17 PM 01/14/2023    1:48 PM  01/30/2023    3:20 PM 03/03/2023    3:50 PM  Fall Risk  Falls in the past year? 0 0 0 0 0  Was there an injury with Fall? 0  0  0  0  0   Fall Risk Category Calculator 0 0 0 0 0  Patient at Risk for Falls Due to No Fall Risks   No Fall Risks Impaired balance/gait  Fall risk Follow up Falls evaluation completed   Falls evaluation completed Falls evaluation completed     Data saved with a previous flowsheet row definition   Functional Status Survey:    Vitals:   07/27/24 1032  BP: (!) 156/82  Pulse: 67  Resp: 17  Temp: (!) 97 F (36.1 C)  SpO2: 92%  Weight: 96 lb 9.6 oz (43.8 kg)  Height: 4' 10 (1.473 m)   Body mass index is 20.19 kg/m. Physical Exam Vitals reviewed.  Constitutional:      General: She is not in acute distress. HENT:     Head: Normocephalic.  Eyes:     General:        Right eye: No discharge.        Left eye: No discharge.  Cardiovascular:     Rate and Rhythm: Normal rate and  regular rhythm.     Pulses: Normal pulses.     Heart sounds: Normal heart sounds.  Pulmonary:     Effort: Pulmonary effort is normal. No respiratory distress.     Breath sounds: Normal breath sounds. No wheezing or rales.  Abdominal:     General: Bowel sounds are normal.     Palpations: Abdomen is soft.  Musculoskeletal:     Cervical back: Neck supple.     Right lower leg: Edema present.     Left lower leg: Edema present.     Comments: BLE 2-3+ pitting  Skin:    General: Skin is warm.     Capillary Refill: Capillary refill takes less than 2 seconds.  Neurological:     General: No focal deficit present.     Mental Status: She is alert. Mental status is at baseline.     Gait: Gait abnormal.  Psychiatric:        Mood and Affect: Mood normal.     Labs reviewed: Recent Labs    10/17/23 1638 10/17/23 1930 10/30/23 0429 10/31/23 0606 03/03/24 0000 07/13/24 0925  NA  --    < > 135 139 143 139  K  --    < > 3.1* 4.3 3.7 4.2  CL  --    < > 92* 99 108 99  CO2  --    < > 35* 34* 31* 31  GLUCOSE  --    < > 129* 94  --  112*  BUN  --    < > 36* 37* 22* 22  CREATININE  --    < > 0.93 0.83 0.9 1.00  CALCIUM   --    < > 8.2* 8.5* 8.6* 9.7  MG 2.3  --   --  2.8*  --   --   PHOS 3.2  --   --   --   --   --    < > = values in this interval not displayed.   Recent Labs    10/17/23 1502  AST 30  ALT 39  ALKPHOS 70  BILITOT 0.9  PROT 6.4*  ALBUMIN 3.2*   Recent Labs    10/26/23 0000 10/28/23  0500 10/29/23 0321 07/13/24 0925  WBC 8.0 13.3* 9.9 10.7*  NEUTROABS 6,304.00 10.0*  --   --   HGB 11.0* 13.0 9.5* 13.1  HCT 35* 42.6 29.7* 40.6  MCV  --  105.7* 101.4* 99.8  PLT 260 298 187 203   Lab Results  Component Value Date   TSH 0.968 10/17/2023   Lab Results  Component Value Date   HGBA1C 5.6 11/03/2022   Lab Results  Component Value Date   CHOL 195 07/02/2023   HDL 68 07/02/2023   LDLCALC 108 07/02/2023   TRIG 101 11/03/2022    Significant Diagnostic Results  in last 30 days:  DG Chest 2 View Result Date: 07/13/2024 EXAM: 2 VIEW(S) XRAY OF THE CHEST 07/13/2024 09:58:03 AM COMPARISON: 12/03/2023 CLINICAL HISTORY: sob FINDINGS: LUNGS AND PLEURA: Decreased right pleural effusion from prior. No focal pulmonary opacity. No pneumothorax. HEART AND MEDIASTINUM: Aortic atherosclerosis. No acute abnormality of the cardiac and mediastinal silhouettes. BONES AND SOFT TISSUES: Thoracic spondylosis. IMPRESSION: 1. Decreased right pleural effusion from prior. Electronically signed by: Evalene Coho MD 07/13/2024 10:09 AM EST RP Workstation: [id number]    Assessment/Plan: 1. Chronic congestive heart failure, unspecified heart failure type (HCC) (Primary) - LVEF 35-40% - BLE 2-3+ pitting, tender - lung sound clear - will increase furosemide  to 20 mg and start KCL - cont ted hose  - bmp in 1 week - potassium chloride  (KLOR-CON  M) 10 MEQ tablet; Take 1 tablet (10 mEq total) by mouth daily. Give with furosemide   2. COPD with acute exacerbation (HCC) - 12/17 ED evaluation  - started on prednisone  taper - lung sound clear today  - cont Duler and albuterol   3. Paroxysmal atrial fibrillation (HCC) - HR< 100 with amiodarone  - cont Eliquis  for clot prevention   4. Primary hypertension - controlled with losartan  and metoprolol     Family/ staff Communication: plan discussed with patient and nurse  Labs/tests ordered:  bmp in 1 week       [1]  Allergies Allergen Reactions   Gentamicin    Levofloxacin Diarrhea   "

## 2024-08-04 ENCOUNTER — Emergency Department

## 2024-08-04 ENCOUNTER — Observation Stay
Admission: EM | Admit: 2024-08-04 | Discharge: 2024-08-05 | Attending: Emergency Medicine | Admitting: Emergency Medicine

## 2024-08-04 ENCOUNTER — Other Ambulatory Visit: Payer: Self-pay

## 2024-08-04 DIAGNOSIS — F331 Major depressive disorder, recurrent, moderate: Secondary | ICD-10-CM | POA: Diagnosis not present

## 2024-08-04 DIAGNOSIS — Z681 Body mass index (BMI) 19 or less, adult: Secondary | ICD-10-CM | POA: Diagnosis not present

## 2024-08-04 DIAGNOSIS — J449 Chronic obstructive pulmonary disease, unspecified: Secondary | ICD-10-CM | POA: Diagnosis present

## 2024-08-04 DIAGNOSIS — J441 Chronic obstructive pulmonary disease with (acute) exacerbation: Principal | ICD-10-CM | POA: Insufficient documentation

## 2024-08-04 DIAGNOSIS — I5023 Acute on chronic systolic (congestive) heart failure: Secondary | ICD-10-CM | POA: Insufficient documentation

## 2024-08-04 DIAGNOSIS — E039 Hypothyroidism, unspecified: Secondary | ICD-10-CM | POA: Insufficient documentation

## 2024-08-04 DIAGNOSIS — R636 Underweight: Secondary | ICD-10-CM | POA: Diagnosis not present

## 2024-08-04 DIAGNOSIS — Z8679 Personal history of other diseases of the circulatory system: Secondary | ICD-10-CM | POA: Insufficient documentation

## 2024-08-04 DIAGNOSIS — Z7901 Long term (current) use of anticoagulants: Secondary | ICD-10-CM | POA: Diagnosis not present

## 2024-08-04 DIAGNOSIS — Z79899 Other long term (current) drug therapy: Secondary | ICD-10-CM | POA: Diagnosis not present

## 2024-08-04 DIAGNOSIS — E785 Hyperlipidemia, unspecified: Secondary | ICD-10-CM | POA: Diagnosis not present

## 2024-08-04 DIAGNOSIS — Z87891 Personal history of nicotine dependence: Secondary | ICD-10-CM | POA: Diagnosis not present

## 2024-08-04 DIAGNOSIS — Z85828 Personal history of other malignant neoplasm of skin: Secondary | ICD-10-CM | POA: Insufficient documentation

## 2024-08-04 DIAGNOSIS — I110 Hypertensive heart disease with heart failure: Secondary | ICD-10-CM | POA: Insufficient documentation

## 2024-08-04 DIAGNOSIS — R0602 Shortness of breath: Secondary | ICD-10-CM | POA: Diagnosis present

## 2024-08-04 DIAGNOSIS — F419 Anxiety disorder, unspecified: Secondary | ICD-10-CM | POA: Diagnosis not present

## 2024-08-04 DIAGNOSIS — E43 Unspecified severe protein-calorie malnutrition: Secondary | ICD-10-CM | POA: Diagnosis present

## 2024-08-04 DIAGNOSIS — I480 Paroxysmal atrial fibrillation: Secondary | ICD-10-CM

## 2024-08-04 DIAGNOSIS — R7989 Other specified abnormal findings of blood chemistry: Secondary | ICD-10-CM | POA: Diagnosis not present

## 2024-08-04 DIAGNOSIS — E782 Mixed hyperlipidemia: Secondary | ICD-10-CM | POA: Diagnosis present

## 2024-08-04 DIAGNOSIS — I10 Essential (primary) hypertension: Secondary | ICD-10-CM | POA: Diagnosis present

## 2024-08-04 LAB — CBC WITH DIFFERENTIAL/PLATELET
Abs Immature Granulocytes: 0.05 K/uL (ref 0.00–0.07)
Basophils Absolute: 0.1 K/uL (ref 0.0–0.1)
Basophils Relative: 1 %
Eosinophils Absolute: 0 K/uL (ref 0.0–0.5)
Eosinophils Relative: 0 %
HCT: 40.6 % (ref 36.0–46.0)
Hemoglobin: 12.7 g/dL (ref 12.0–15.0)
Immature Granulocytes: 1 %
Lymphocytes Relative: 8 %
Lymphs Abs: 0.9 K/uL (ref 0.7–4.0)
MCH: 31.8 pg (ref 26.0–34.0)
MCHC: 31.3 g/dL (ref 30.0–36.0)
MCV: 101.8 fL — ABNORMAL HIGH (ref 80.0–100.0)
Monocytes Absolute: 0.7 K/uL (ref 0.1–1.0)
Monocytes Relative: 6 %
Neutro Abs: 9.3 K/uL — ABNORMAL HIGH (ref 1.7–7.7)
Neutrophils Relative %: 84 %
Platelets: 291 K/uL (ref 150–400)
RBC: 3.99 MIL/uL (ref 3.87–5.11)
RDW: 15 % (ref 11.5–15.5)
WBC: 11 K/uL — ABNORMAL HIGH (ref 4.0–10.5)
nRBC: 0 % (ref 0.0–0.2)

## 2024-08-04 LAB — PROTIME-INR
INR: 1.2 (ref 0.8–1.2)
Prothrombin Time: 16.1 s — ABNORMAL HIGH (ref 11.4–15.2)

## 2024-08-04 LAB — HEPARIN LEVEL (UNFRACTIONATED): Heparin Unfractionated: >1.1 [IU]/mL — ABNORMAL HIGH (ref 0.30–0.70)

## 2024-08-04 LAB — BASIC METABOLIC PANEL WITH GFR
Anion gap: 10 (ref 5–15)
BUN: 29 mg/dL — ABNORMAL HIGH (ref 8–23)
CO2: 26 mmol/L (ref 22–32)
Calcium: 9.4 mg/dL (ref 8.9–10.3)
Chloride: 105 mmol/L (ref 98–111)
Creatinine, Ser: 1.08 mg/dL — ABNORMAL HIGH (ref 0.44–1.00)
GFR, Estimated: 49 mL/min — ABNORMAL LOW
Glucose, Bld: 124 mg/dL — ABNORMAL HIGH (ref 70–99)
Potassium: 5.6 mmol/L — ABNORMAL HIGH (ref 3.5–5.1)
Sodium: 142 mmol/L (ref 135–145)

## 2024-08-04 LAB — PRO BRAIN NATRIURETIC PEPTIDE: Pro Brain Natriuretic Peptide: 1374 pg/mL — ABNORMAL HIGH

## 2024-08-04 LAB — TROPONIN T, HIGH SENSITIVITY
Troponin T High Sensitivity: 30 ng/L — ABNORMAL HIGH (ref 0–19)
Troponin T High Sensitivity: 109 ng/L (ref 0–19)
Troponin T High Sensitivity: 161 ng/L (ref 0–19)

## 2024-08-04 LAB — RESP PANEL BY RT-PCR (RSV, FLU A&B, COVID)  RVPGX2
Influenza A by PCR: NEGATIVE
Influenza B by PCR: NEGATIVE
Resp Syncytial Virus by PCR: NEGATIVE
SARS Coronavirus 2 by RT PCR: NEGATIVE

## 2024-08-04 LAB — APTT
aPTT: 34 s (ref 24–36)
aPTT: 63 s — ABNORMAL HIGH (ref 24–36)

## 2024-08-04 MED ORDER — FLEET ENEMA RE ENEM: 1.0000 | ENEMA | Freq: Every day | RECTAL | Status: DC | PRN

## 2024-08-04 MED ORDER — DAPAGLIFLOZIN PROPANEDIOL 10 MG PO TABS
10.0000 mg | ORAL_TABLET | Freq: Every day | ORAL | Status: DC
  Administered 2024-08-05: 10 mg via ORAL
  Filled 2024-08-04: qty 1

## 2024-08-04 MED ORDER — MONTELUKAST SODIUM 10 MG PO TABS
10.0000 mg | ORAL_TABLET | Freq: Every day | ORAL | Status: DC
  Administered 2024-08-04: 10 mg via ORAL
  Filled 2024-08-04: qty 1

## 2024-08-04 MED ORDER — AMIODARONE HCL 200 MG PO TABS
200.0000 mg | ORAL_TABLET | Freq: Every day | ORAL | Status: DC
  Administered 2024-08-04 – 2024-08-05 (×2): 200 mg via ORAL
  Filled 2024-08-04 (×2): qty 1

## 2024-08-04 MED ORDER — POLYETHYLENE GLYCOL 3350 17 G PO PACK
17.0000 g | PACK | Freq: Every day | ORAL | Status: DC
  Administered 2024-08-04: 17 g via ORAL
  Filled 2024-08-04 (×2): qty 1

## 2024-08-04 MED ORDER — METOPROLOL SUCCINATE ER 25 MG PO TB24
12.5000 mg | ORAL_TABLET | Freq: Two times a day (BID) | ORAL | Status: DC
  Administered 2024-08-04 – 2024-08-05 (×2): 12.5 mg via ORAL
  Filled 2024-08-04 (×3): qty 1

## 2024-08-04 MED ORDER — PREDNISONE 20 MG PO TABS
40.0000 mg | ORAL_TABLET | Freq: Every day | ORAL | Status: DC
  Administered 2024-08-05: 40 mg via ORAL
  Filled 2024-08-04: qty 2

## 2024-08-04 MED ORDER — METHYLPREDNISOLONE SODIUM SUCC 125 MG IJ SOLR
125.0000 mg | Freq: Once | INTRAMUSCULAR | Status: AC
  Administered 2024-08-04: 125 mg via INTRAVENOUS
  Filled 2024-08-04: qty 2

## 2024-08-04 MED ORDER — ENOXAPARIN SODIUM 40 MG/0.4ML IJ SOSY: 40.0000 mg | PREFILLED_SYRINGE | INTRAMUSCULAR | Status: DC

## 2024-08-04 MED ORDER — IPRATROPIUM-ALBUTEROL 0.5-2.5 (3) MG/3ML IN SOLN
3.0000 mL | RESPIRATORY_TRACT | Status: AC
  Administered 2024-08-04 (×3): 3 mL via RESPIRATORY_TRACT
  Filled 2024-08-04 (×2): qty 3

## 2024-08-04 MED ORDER — ACETAMINOPHEN 325 MG PO TABS: 650.0000 mg | ORAL_TABLET | ORAL | Status: DC | PRN

## 2024-08-04 MED ORDER — IPRATROPIUM-ALBUTEROL 0.5-2.5 (3) MG/3ML IN SOLN
3.0000 mL | RESPIRATORY_TRACT | Status: AC
  Administered 2024-08-04: 3 mL via RESPIRATORY_TRACT
  Filled 2024-08-04 (×2): qty 3

## 2024-08-04 MED ORDER — HEPARIN BOLUS VIA INFUSION
2600.0000 [IU] | Freq: Once | INTRAVENOUS | Status: DC
  Filled 2024-08-04: qty 2600

## 2024-08-04 MED ORDER — ALBUTEROL SULFATE (2.5 MG/3ML) 0.083% IN NEBU: 2.5000 mg | INHALATION_SOLUTION | RESPIRATORY_TRACT | Status: DC | PRN

## 2024-08-04 MED ORDER — APIXABAN 5 MG PO TABS: 5.0000 mg | ORAL_TABLET | Freq: Two times a day (BID) | ORAL | Status: DC

## 2024-08-04 MED ORDER — LOSARTAN POTASSIUM 50 MG PO TABS
50.0000 mg | ORAL_TABLET | Freq: Every day | ORAL | Status: DC
  Administered 2024-08-04 – 2024-08-05 (×2): 50 mg via ORAL
  Filled 2024-08-04 (×2): qty 1

## 2024-08-04 MED ORDER — IPRATROPIUM-ALBUTEROL 0.5-2.5 (3) MG/3ML IN SOLN
3.0000 mL | Freq: Four times a day (QID) | RESPIRATORY_TRACT | Status: DC
  Administered 2024-08-04: 3 mL via RESPIRATORY_TRACT
  Filled 2024-08-04 (×2): qty 3

## 2024-08-04 MED ORDER — ONDANSETRON HCL 4 MG PO TABS: 4.0000 mg | ORAL_TABLET | ORAL | Status: DC | PRN

## 2024-08-04 MED ORDER — SENNOSIDES-DOCUSATE SODIUM 8.6-50 MG PO TABS
2.0000 | ORAL_TABLET | Freq: Once | ORAL | Status: AC
  Administered 2024-08-04: 2 via ORAL
  Filled 2024-08-04: qty 2

## 2024-08-04 MED ORDER — FUROSEMIDE 20 MG PO TABS
20.0000 mg | ORAL_TABLET | Freq: Every day | ORAL | Status: DC
  Administered 2024-08-04 – 2024-08-05 (×2): 20 mg via ORAL
  Filled 2024-08-04 (×2): qty 1

## 2024-08-04 MED ORDER — POTASSIUM CHLORIDE CRYS ER 10 MEQ PO TBCR
10.0000 meq | EXTENDED_RELEASE_TABLET | Freq: Every day | ORAL | Status: DC
  Administered 2024-08-05: 10 meq via ORAL
  Filled 2024-08-04 (×2): qty 1

## 2024-08-04 MED ORDER — PRAVASTATIN SODIUM 20 MG PO TABS
20.0000 mg | ORAL_TABLET | Freq: Every day | ORAL | Status: DC
  Administered 2024-08-04: 20 mg via ORAL
  Filled 2024-08-04: qty 1

## 2024-08-04 MED ORDER — LORATADINE 10 MG PO TABS
10.0000 mg | ORAL_TABLET | Freq: Every day | ORAL | Status: DC
  Administered 2024-08-04 – 2024-08-05 (×2): 10 mg via ORAL
  Filled 2024-08-04 (×2): qty 1

## 2024-08-04 MED ORDER — HEPARIN BOLUS VIA INFUSION
650.0000 [IU] | Freq: Once | INTRAVENOUS | Status: AC
  Administered 2024-08-04: 650 [IU] via INTRAVENOUS
  Filled 2024-08-04: qty 650

## 2024-08-04 MED ORDER — HEPARIN (PORCINE) 25000 UT/250ML-% IV SOLN
550.0000 [IU]/h | INTRAVENOUS | Status: DC
  Administered 2024-08-04: 500 [IU]/h via INTRAVENOUS
  Filled 2024-08-04: qty 250

## 2024-08-04 MED ORDER — FUROSEMIDE 10 MG/ML IJ SOLN
40.0000 mg | Freq: Once | INTRAMUSCULAR | Status: AC
  Administered 2024-08-04: 40 mg via INTRAVENOUS
  Filled 2024-08-04: qty 4

## 2024-08-04 MED ORDER — ARIPIPRAZOLE 2 MG PO TABS
2.0000 mg | ORAL_TABLET | Freq: Every day | ORAL | Status: DC
  Administered 2024-08-05: 2 mg via ORAL
  Filled 2024-08-04 (×2): qty 1

## 2024-08-04 MED ORDER — BISACODYL 10 MG RE SUPP: 10.0000 mg | Freq: Every day | RECTAL | Status: DC | PRN

## 2024-08-04 NOTE — ED Triage Notes (Signed)
 89 y/o F, BIBA from home for c/o shortness of breath after getting up to go to the bathroom this morning.

## 2024-08-04 NOTE — Evaluation (Signed)
 Occupational Therapy Evaluation Patient Details Name: Angela Mason MRN: [id number] DOB: [birth year] Today's Date: 08/04/2024   History of Present Illness   89 y.o. female with history of COPD, CHF, hypertension, hyperlipidemia, hypothyroidism, atrial fibrillation on Eliquis  who presents to the emergency department shortness of breath that started when she was coming back from the bathroom this morning.     Clinical Impressions Patient presenting with decreased Ind in self care,balance, functional mobility, transfers, endurance, and safety awareness. Patient reports living in the ALF portion of [place]. She ambulates with AD to dinning room for meals and endorses being Ind in self care tasks except for if she gets into shower staff assist for safety. Pt needing assistance for bed mobility, ambulation, toilet transfers, and clothing management. Pt is not at baseline. Patient will benefit from acute OT to increase overall independence in the areas of ADLs, functional mobility, and safety awareness in order to safely discharge.      Functional Status Assessment   Patient has had a recent decline in their functional status and demonstrates the ability to make significant improvements in function in a reasonable and predictable amount of time.     Equipment Recommendations   None recommended by OT      Precautions/Restrictions   Precautions Precautions: Fall     Mobility Bed Mobility Overal bed mobility: Needs Assistance Bed Mobility: Supine to Sit, Sit to Supine     Supine to sit: Mod assist Sit to supine: Mod assist        Transfers Overall transfer level: Needs assistance Equipment used: Rolling walker (2 wheels) Transfers: Sit to/from Stand Sit to Stand: Min assist                  Balance Overall balance assessment: Needs assistance Sitting-balance support: Feet supported Sitting balance-Leahy Scale: Good     Standing balance support: Reliant on  assistive device for balance, During functional activity, Bilateral upper extremity supported Standing balance-Leahy Scale: Poor                             ADL either performed or assessed with clinical judgement   ADL Overall ADL's : Needs assistance/impaired                         Toilet Transfer: Minimal assistance;Rolling walker (2 wheels);Regular Toilet   Toileting- Clothing Manipulation and Hygiene: Moderate assistance;Sit to/from stand               Vision Patient Visual Report: No change from baseline              Pertinent Vitals/Pain Pain Assessment Pain Assessment: No/denies pain     Extremity/Trunk Assessment Upper Extremity Assessment Upper Extremity Assessment: Generalized weakness   Lower Extremity Assessment Lower Extremity Assessment: Generalized weakness       Communication Communication Communication: No apparent difficulties   Cognition Arousal: Alert Behavior During Therapy: WFL for tasks assessed/performed Cognition: No apparent impairments                               Following commands: Impaired Following commands impaired: Follows one step commands with increased time     Cueing  General Comments   Cueing Techniques: Verbal cues              Home Living Family/patient expects to be discharged to:: Assisted  living                             Home Equipment: Rollator (4 wheels)   Additional Comments: [facility]      Prior Functioning/Environment Prior Level of Function : Needs assist             Mobility Comments: pt amb with rollator per her report ADLs Comments: assist with compression socks and shower from staff. She ambulates with rollator to get onto elevator to go to cafe for meals.    OT Problem List: Decreased strength;Decreased activity tolerance;Impaired balance (sitting and/or standing);Decreased safety awareness   OT Treatment/Interventions:  Self-care/ADL training;Balance training;Therapeutic exercise;Therapeutic activities;Energy conservation;Cognitive remediation/compensation;DME and/or AE instruction;Patient/family education      OT Goals(Current goals can be found in the care plan section)   Acute Rehab OT Goals Patient Stated Goal: to get stronger OT Goal Formulation: With patient Time For Goal Achievement: 08/18/24 Potential to Achieve Goals: Fair ADL Goals Pt Will Perform Grooming: with modified independence;standing Pt Will Perform Lower Body Dressing: with modified independence;sit to/from stand Pt Will Transfer to Toilet: with modified independence;ambulating Pt Will Perform Toileting - Clothing Manipulation and hygiene: with modified independence;sit to/from stand   OT Frequency:  Min 2X/week       AM-PAC OT 6 Clicks Daily Activity     Outcome Measure Help from another person eating meals?: A Little Help from another person taking care of personal grooming?: A Little Help from another person toileting, which includes using toliet, bedpan, or urinal?: A Lot Help from another person bathing (including washing, rinsing, drying)?: A Lot Help from another person to put on and taking off regular upper body clothing?: A Little Help from another person to put on and taking off regular lower body clothing?: A Lot 6 Click Score: 15   End of Session Equipment Utilized During Treatment: Rolling walker (2 wheels) Nurse Communication: Mobility status  Activity Tolerance: Patient limited by fatigue;Patient tolerated treatment well Patient left: in bed;with call bell/phone within reach;with bed alarm set  OT Visit Diagnosis: Unsteadiness on feet (R26.81);Muscle weakness (generalized) (M62.81)                Time: 8774-8748 OT Time Calculation (min): 26 min Charges:  OT General Charges $OT Visit: 1 Visit OT Evaluation $OT Eval Moderate Complexity: 1 Mod OT Treatments $Self Care/Home Management : 8-22  mins  Izetta Claude, MS, OTR/L , CBIS ascom [phone]  08/04/2024, 2:57 PM

## 2024-08-04 NOTE — Progress Notes (Signed)
 PHARMACY - ANTICOAGULATION CONSULT NOTE  Pharmacy Consult for heparin  Indication: chest pain/ACS  Allergies[1]  Patient Measurements: Height: 5' (152.4 cm) Weight: 43.9 kg (96 lb 12.5 oz) IBW/kg (Calculated) : 45.5 HEPARIN  DW (KG): 43.9  Vital Signs: Temp: 97.9 F (36.6 C) (01/08 1538) Temp Source: Oral (01/08 1538) BP: 143/70 (01/08 1538) Pulse Rate: 76 (01/08 1538)  Labs: Recent Labs    08/04/24 0624 08/04/24 1019 08/04/24 1910  HGB 12.7  --   --   HCT 40.6  --   --   PLT 291  --   --   APTT  --  34 63*  LABPROT  --  16.1*  --   INR  --  1.2  --   HEPARINUNFRC  --  >1.10*  --   CREATININE 1.08*  --   --     Estimated Creatinine Clearance: 24.5 mL/min (A) (by C-G formula based on SCr of 1.08 mg/dL (H)).   Medical History: Past Medical History:  Diagnosis Date   Cancer (HCC)    SKIN   COPD, mild (HCC)    Dyspnea    Edema    Heart failure with mildly reduced ejection fraction (HFmrEF) (HCC)    HLD (hyperlipidemia)    HTN (hypertension)    Hypothyroidism    Macular degeneration, bilateral    Paroxysmal atrial fibrillation (HCC)    Varicosities    Assessment: 89 y/o female presenting with chest tightness, found to have elevated troponin level in ED. PMH significant for COPD, CHF, hypertension, hyperlipidemia, hypothyroidism, atrial fibrillation. Pharmacy has been consulted to initiate heparin  infusion. Per patient, she was not taking her Eliquis  prior to admission - will check baseline heparin  level.  1/8: Baseline labs: hgb 12.7, plt 291, INR, HL > 1.10, aPTT 34, INR 1.2.  Goal of Therapy:  Heparin  level 0.3-0.7 units/ml aPTT 66-102 seconds Monitor platelets by anticoagulation protocol: Yes  Date  Time   aPTT   Comment 1/8     1910    63       SUBtherapeutic   Plan:  Bolus 650 units and increase rate to 550 units/hr (from 500 units/hr) Check 8 hour aPTT level from rate change Baseline heparin  level elevated - will monitor heparin  infusion using  aPTT levels until correlating with heparin  levels Monitor daily CBC and signs/symptoms of bleeding  Thank you for involving pharmacy in this patient's care.   Leonor Argyle, PharmD 08/04/2024 7:43 PM     [1]  Allergies Allergen Reactions   Gentamicin    Levofloxacin Diarrhea

## 2024-08-04 NOTE — Hospital Course (Signed)
 Hospital course / significant events:   Angela Mason is a 89 y.o. female  with a past medical history of paroxysmal atrial fibrillation, chronic HFmrEF, hypertension, hyperlipidemia, COPD, hypothyroidism who presented to the ED on 08/04/2024 for shortness of breath.   HPI: Patient resides at assisted living, noted some shortness of breath when she got up overnight to go to the bathroom, shortness of breath was not improving with rest so she called for help.  Reportedly EMS gave nebulizer treatment which also did not improve so she was brought into the emergency department.  Patient is also reporting some chest pain, described as a tightness, states that it is alleviated when she takes a deep breath in.  At time of my examination, patient states she is feeling better.   01/08: In ED, initial triage vitals 94% on room air, hypertensive 182/82, reportedly was down to 92% on room air and was placed on nasal cannula.  Some increased work of breathing with respiratory rate into the 20s, BMP and BNP and troponin were pending at time of admission to observation with hospitalist.  Troponin trending up from 30-109, and concern for some T wave inversions on repeated EKG, started on heparin , cardiology was consulted --> trial dose of IV Lasix , continue home amiodarone , losartan , metoprolol , pravastatin .  Patient has stopped Eliquis  and declines resuming this.  Suspect mild troponin elevation most likely with supply/demand mismatch and not ACS, medical management and no further procedures at this time.     Consultants:  Cardiology  Procedures/Surgeries: None      ASSESSMENT & PLAN:   COPD exacerbation DuoNebs scheduled every 6 hours Albuterol  neb as needed Status post Solu-Medrol  in ED, start prednisone  tomorrow to finish 5-day total steroid burst Continued assessment for oxygen needs, O2 via [place] as needed SpO2 less than 90% on room air  Acute on chronic HFmrEF, mild Paroxysmal atrial  fibrillation Demand ischemia Suspect mild troponin most consistent with demand/supply mismatch and not ACS in the setting of COPD exacerbation.  IV lasix  40 mg and assess response.  Continue home amiodarone  200 mg daily.  Patient has stopped home eliquis  and declines resuming. Continue losartan  50 mg daily Continue metoprolol  succinate 12.5 mg twice daily.  Continue pravastatin  20 mg daily. Continue Farxiga  10 mg daily Appreciate cardiology input.  Given her frail status and improvement in symptoms, will recommend medical management.  Continue heparin  x24h / per cardiology   Anxiety/depression Continue Abilify     underweight based on BMI: Body mass index is 18.9 kg/m.[name] Significantly low or high BMI is associated with higher medical risk.  Underweight - under 18  overweight - 25 to 29 obese - 30 or more Class 1 obesity: BMI of 30.0 to 34 Class 2 obesity: BMI of 35.0 to 39 Class 3 obesity: BMI of 40.0 to 49 Super Morbid Obesity: BMI 50-59 Super-super Morbid Obesity: BMI 60+ Healthy nutrition and physical activity advised as adjunct to other disease management and risk reduction treatments    DVT prophylaxis: heparin  IV fluids: no continuous IV fluids  Nutrition: cardaic Central lines / other devices: none  Code Status: DNR confirmed w/ patient at bedside ACP documentation reviewed:  none on file in [facility]  TOC needs: TBD Medical barriers to dispo: O2, heparin . Expected medical readiness for discharge tomorrow / pend cardiology clearance.

## 2024-08-04 NOTE — Progress Notes (Addendum)
 PHARMACY - ANTICOAGULATION CONSULT NOTE  Pharmacy Consult for heparin  Indication: chest pain/ACS  Allergies[1]  Patient Measurements: Height: 5' (152.4 cm) Weight: 43.9 kg (96 lb 12.5 oz) IBW/kg (Calculated) : 45.5 HEPARIN  DW (KG): 43.9  Vital Signs: Temp: 98 F (36.7 C) (01/08 0610) Temp Source: Oral (01/08 0610) BP: 141/61 (01/08 0800) Pulse Rate: 61 (01/08 0800)  Labs: Recent Labs    08/04/24 0624  HGB 12.7  HCT 40.6  PLT 291  CREATININE 1.08*    Estimated Creatinine Clearance: 24.5 mL/min (A) (by C-G formula based on SCr of 1.08 mg/dL (H)).   Medical History: Past Medical History:  Diagnosis Date   Cancer (HCC)    SKIN   COPD, mild (HCC)    Dyspnea    Edema    Heart failure with mildly reduced ejection fraction (HFmrEF) (HCC)    HLD (hyperlipidemia)    HTN (hypertension)    Hypothyroidism    Macular degeneration, bilateral    Paroxysmal atrial fibrillation (HCC)    Varicosities    Assessment: 89 y/o female presenting with chest tightness, found to have elevated troponin level in ED. PMH significant for COPD, CHF, hypertension, hyperlipidemia, hypothyroidism, atrial fibrillation. Pharmacy has been consulted to initiate heparin  infusion. Per patient, she was not taking her Eliquis  prior to admission - will check baseline heparin  level.  Baseline labs: hgb 12.7, plt 291, INR, HL > 1.10, aPTT 34, INR 1.2.  Goal of Therapy:  Heparin  level 0.3-0.7 units/ml Monitor platelets by anticoagulation protocol: Yes   Plan:  Baseline heparin  level elevated - will monitor heparin  infusion using aPTT levels until correlating with heparin  levels Start heparin  infusion at 500 units/hour without bolus given recent use of Eliquis  Check 8 hour aPTT level Monitor daily CBC and signs/symptoms of bleeding  Thank you for involving pharmacy in this patient's care.   Damien Napoleon, PharmD Clinical Pharmacist 08/04/2024 10:12 AM    [1]  Allergies Allergen Reactions    Gentamicin    Levofloxacin Diarrhea

## 2024-08-04 NOTE — Evaluation (Signed)
 Physical Therapy Evaluation Patient Details Name: Angela Mason MRN: [id number] DOB: January 07, 1933 Today's Date: 08/04/2024  History of Present Illness  89 y.o. female with history of COPD, CHF, hypertension, hyperlipidemia, hypothyroidism, atrial fibrillation on Eliquis  who presents to the emergency department shortness of breath that started when she was coming back from the bathroom this morning.  Clinical Impression  Pt pleasant and willing to do some activity with PT but did not wish to do too much.  Pt able to ambulate with FWW (uses and prefers 4WW at her ALF).  She reports no pain, minimal fatigue, SpO2 remained in the high 90s on room air with no overt DOE with activity.  Pt did have some issues with balance, specifically reto lean during transitions to standing and light balance challenges, but reports feeling close to her baseline just a little weak.  Pt will benefit from continued PT to address functional limitations.       If plan is discharge home, recommend the following: A little help with walking and/or transfers;A little help with bathing/dressing/bathroom;Assist for transportation   Can travel by private vehicle        Equipment Recommendations None recommended by PT  Recommendations for Other Services       Functional Status Assessment Patient has had a recent decline in their functional status and demonstrates the ability to make significant improvements in function in a reasonable and predictable amount of time.     Precautions / Restrictions Precautions Precautions: Fall Restrictions Weight Bearing Restrictions Per Provider Order: No      Mobility  Bed Mobility               General bed mobility comments: in recliner pre/post session    Transfers Overall transfer level: Needs assistance Equipment used: Rolling walker (2 wheels) Transfers: Sit to/from Stand Sit to Stand: Min assist           General transfer comment: sit to stand with HHA  and with walker, each time pt with initial retro lean and need for direct assist to insure safe forward weight shift    Ambulation/Gait Ambulation/Gait assistance: Contact guard assist Gait Distance (Feet): 40 Feet Assistive device: Rolling walker (2 wheels)         General Gait Details: Pt with slow and guarded gait, needing intermittent cues to stay forward in the walker.  She reports My rollator is much better.  Pt with no DOE, SpO2 in the high 90s on room air after the effort.  Stairs            Wheelchair Mobility     Tilt Bed    Modified Rankin (Stroke Patients Only)       Balance Overall balance assessment: Needs assistance Sitting-balance support: Feet supported Sitting balance-Leahy Scale: Good     Standing balance support: Reliant on assistive device for balance, During functional activity, Bilateral upper extremity supported Standing balance-Leahy Scale: Poor Standing balance comment: highly reliant on UEs, tending to lean backward                             Pertinent Vitals/Pain Pain Assessment Pain Assessment: No/denies pain    Home Living Family/patient expects to be discharged to:: Assisted living                 Home Equipment: Rollator (4 wheels) Additional Comments: [facility]    Prior Function Prior Level of Function : Needs assist  Mobility Comments: pt amb with rollator (some times I leave it behind, I know where I can grab if I need to ADLs Comments: assist with compression socks and shower from staff. She ambulates with rollator to get onto elevator to go to cafe for meals.     Extremity/Trunk Assessment   Upper Extremity Assessment Upper Extremity Assessment: Generalized weakness (age appropriate)    Lower Extremity Assessment Lower Extremity Assessment: Generalized weakness (age appropriate)       Communication   Communication Communication: No apparent difficulties     Cognition Arousal: Alert Behavior During Therapy: WFL for tasks assessed/performed                             Following commands: Impaired Following commands impaired: Follows one step commands with increased time     Cueing Cueing Techniques: Verbal cues     General Comments General comments (skin integrity, edema, etc.): Pt pleasant, reports feeling close to her baseline    Exercises     Assessment/Plan    PT Assessment Patient needs continued PT services  PT Problem List Decreased strength;Decreased range of motion;Decreased activity tolerance;Decreased balance;Decreased mobility;Decreased coordination;Decreased knowledge of use of DME;Decreased safety awareness       PT Treatment Interventions DME instruction;Gait training;Functional mobility training;Therapeutic activities;Therapeutic exercise;Balance training;Cognitive remediation;Patient/family education    PT Goals (Current goals can be found in the Care Plan section)  Acute Rehab PT Goals Patient Stated Goal: go back to [facility] soon PT Goal Formulation: With patient Time For Goal Achievement: 08/17/24 Potential to Achieve Goals: Good    Frequency Min 2X/week     Co-evaluation               AM-PAC PT 6 Clicks Mobility  Outcome Measure Help needed turning from your back to your side while in a flat bed without using bedrails?: A Little Help needed moving from lying on your back to sitting on the side of a flat bed without using bedrails?: A Little Help needed moving to and from a bed to a chair (including a wheelchair)?: A Little Help needed standing up from a chair using your arms (e.g., wheelchair or bedside chair)?: A Little Help needed to walk in hospital room?: A Little Help needed climbing 3-5 steps with a railing? : A Lot 6 Click Score: 17    End of Session Equipment Utilized During Treatment: Gait belt Activity Tolerance: Patient tolerated treatment well Patient left: with  chair alarm set;with call bell/phone within reach Nurse Communication: Mobility status PT Visit Diagnosis: Unsteadiness on feet (R26.81);Muscle weakness (generalized) (M62.81);Difficulty in walking, not elsewhere classified (R26.2)    Time: 8464-8444 PT Time Calculation (min) (ACUTE ONLY): 20 min   Charges:   PT Evaluation $PT Eval Low Complexity: 1 Low   PT General Charges $$ ACUTE PT VISIT: 1 Visit         Carmin JONELLE Deed, DPT 08/04/2024, 4:36 PM

## 2024-08-04 NOTE — H&P (Signed)
 " HISTORY AND PHYSICAL    Angela Mason   FMW:[id number] DOB: Mar 09, 1933   Date of Service: 08/04/2024 Requesting physician/APP from ED: Treatment Team:  Attending Provider: Kazuko Clemence, DO  PCP: Laurence Locus, [facility] course / significant events:   Angela Mason is a 89 y.o. female  with a past medical history of paroxysmal atrial fibrillation, chronic HFmrEF, hypertension, hyperlipidemia, COPD, hypothyroidism who presented to the ED on 08/04/2024 for shortness of breath.   HPI: Patient resides at assisted living, noted some shortness of breath when she got up overnight to go to the bathroom, shortness of breath was not improving with rest so she called for help.  Reportedly EMS gave nebulizer treatment which also did not improve so she was brought into the emergency department.  Patient is also reporting some chest pain, described as a tightness, states that it is alleviated when she takes a deep breath in.  At time of my examination, patient states she is feeling better.   01/08: In ED, initial triage vitals 94% on room air, hypertensive 182/82, reportedly was down to 92% on room air and was placed on nasal cannula.  Some increased work of breathing with respiratory rate into the 20s, BMP and BNP and troponin were pending at time of admission to observation with hospitalist.  Troponin trending up from 30-109, and concern for some T wave inversions on repeated EKG, started on heparin , cardiology was consulted --> trial dose of IV Lasix , continue home amiodarone , losartan , metoprolol , pravastatin .  Patient has stopped Eliquis  and declines resuming this.  Suspect mild troponin elevation most likely with supply/demand mismatch and not ACS, medical management and no further procedures at this time.     Consultants:  Cardiology  Procedures/Surgeries: None      ASSESSMENT & PLAN:   COPD exacerbation DuoNebs scheduled every 6 hours Albuterol  neb as needed Status post  Solu-Medrol  in ED, start prednisone  tomorrow to finish 5-day total steroid burst Continued assessment for oxygen needs, O2 via [place] as needed SpO2 less than 90% on room air  Acute on chronic HFmrEF, mild Paroxysmal atrial fibrillation Demand ischemia Suspect mild troponin most consistent with demand/supply mismatch and not ACS in the setting of COPD exacerbation.  IV lasix  40 mg and assess response.  Continue home amiodarone  200 mg daily.  Patient has stopped home eliquis  and declines resuming. Continue losartan  50 mg daily Continue metoprolol  succinate 12.5 mg twice daily.  Continue pravastatin  20 mg daily. Continue Farxiga  10 mg daily Appreciate cardiology input.  Given her frail status and improvement in symptoms, will recommend medical management.  Continue heparin  x24h / per cardiology   Anxiety/depression Continue Abilify     underweight based on BMI: Body mass index is 18.9 kg/m.[name] Significantly low or high BMI is associated with higher medical risk.  Underweight - under 18  overweight - 25 to 29 obese - 30 or more Class 1 obesity: BMI of 30.0 to 34 Class 2 obesity: BMI of 35.0 to 39 Class 3 obesity: BMI of 40.0 to 49 Super Morbid Obesity: BMI 50-59 Super-super Morbid Obesity: BMI 60+ Healthy nutrition and physical activity advised as adjunct to other disease management and risk reduction treatments    DVT prophylaxis: heparin  IV fluids: no continuous IV fluids  Nutrition: cardaic Central lines / other devices: none  Code Status: DNR confirmed w/ patient at bedside ACP documentation reviewed:  none on file in [facility]  TOC needs: TBD Medical barriers to dispo: O2, heparin . Expected medical readiness for  discharge tomorrow / pend cardiology clearance.                   Review of Systems:  Review of Systems  Constitutional:  Negative for chills, fever and malaise/fatigue.  Respiratory:  Positive for shortness of breath. Negative for cough and sputum  production.   Cardiovascular:  Positive for chest pain and orthopnea. Negative for palpitations, claudication and leg swelling (stable/no change).  Genitourinary:  Negative for dysuria and frequency.  Musculoskeletal:  Negative for myalgias.  Neurological:  Negative for dizziness and focal weakness.       has a past medical history of Cancer (HCC), COPD, mild (HCC), Dyspnea, Edema, Heart failure with mildly reduced ejection fraction (HFmrEF) (HCC), HLD (hyperlipidemia), HTN (hypertension), Hypothyroidism, Macular degeneration, bilateral, Paroxysmal atrial fibrillation (HCC), and Varicosities.  Show/hide medication list[1]  Allergies[2]   reports that she quit smoking about 18 years ago. Her smoking use included cigarettes. She has never used smokeless tobacco. She reports that she does not currently use alcohol . She reports that she does not use drugs.   family history includes COPD in her father; Diverticulitis in her mother; Epilepsy in her sister; Heart failure in her father; Macular degeneration in her mother; Parkinson's disease in her brother. Past Surgical History:  Procedure Laterality Date   CATARACT EXTRACTION W/PHACO Right 02/03/2017   Procedure: CATARACT EXTRACTION PHACO AND INTRAOCULAR LENS PLACEMENT (IOC);  Surgeon: Jaye Fallow, MD;  Location: [facility] ORS;  Service: Ophthalmology;  Laterality: Right;  US  00:57.2 AP% 18.5 CDE 10.58 Fluid pack lot # [id number]   CATARACT EXTRACTION W/PHACO Left 02/24/2017   Procedure: CATARACT EXTRACTION PHACO AND INTRAOCULAR LENS PLACEMENT (IOC);  Surgeon: Jaye Fallow, MD;  Location: [facility] ORS;  Service: Ophthalmology;  Laterality: Left;  US  01:12 AP% 25.0 CDE 18.03 Fluid pack lot # [id number]   MOHS SURGERY  2015   nose   SALIVARY GLAND SURGERY Left 2012   TONSILLECTOMY  1940          Objective Findings:  Vitals:   08/04/24 1130 08/04/24 1136 08/04/24 1139 08/04/24 1538  BP: (!) 109/52  (!) 109/52 (!) 143/70  Pulse: 62    76  Resp: (!) 26   (!) 22  Temp:  97.9 F (36.6 C)  97.9 F (36.6 C)  TempSrc:  Oral  Oral  SpO2: 93%   95%  Weight:      Height:       No intake or output data in the 24 hours ending 08/04/24 1606 Filed Weights   08/04/24 0614  Weight: 43.9 kg    Examination:  Physical Exam Cardiovascular:     Rate and Rhythm: Normal rate and regular rhythm.  Pulmonary:     Effort: Pulmonary effort is normal.     Breath sounds: Decreased breath sounds and wheezing (scattered) present.  Musculoskeletal:     Right lower leg: No edema.     Left lower leg: No edema.  Neurological:     Mental Status: She is alert and oriented to person, place, and time.  Psychiatric:        Mood and Affect: Mood normal.        Behavior: Behavior normal.          Scheduled Medications:   amiodarone   200 mg Oral Daily   ARIPiprazole   2 mg Oral Q breakfast   [START ON 08/05/2024] dapagliflozin  propanediol  10 mg Oral Daily   furosemide   20 mg Oral Daily   ipratropium-albuterol   3  mL Nebulization Q6H   loratadine   10 mg Oral Daily   losartan   50 mg Oral Daily   metoprolol  succinate  12.5 mg Oral BID   montelukast   10 mg Oral QHS   potassium chloride   10 mEq Oral Daily   pravastatin   20 mg Oral q1800   [START ON 08/05/2024] predniSONE   40 mg Oral Q breakfast    Continuous Infusions:  heparin  500 Units/hr (08/04/24 1145)    PRN Medications:  acetaminophen , albuterol , ondansetron   Antimicrobials:  Anti-infectives (From admission, onward)    None           Data Reviewed: I have personally reviewed following labs and imaging studies  CBC: Recent Labs  Lab 08/04/24 0624  WBC 11.0*  NEUTROABS 9.3*  HGB 12.7  HCT 40.6  MCV 101.8*  PLT 291   Basic Metabolic Panel: Recent Labs  Lab 08/04/24 0624  NA 142  K 5.6*  CL 105  CO2 26  GLUCOSE 124*  BUN 29*  CREATININE 1.08*  CALCIUM  9.4   GFR: Estimated Creatinine Clearance: 24.5 mL/min (A) (by C-G formula based on SCr of 1.08  mg/dL (H)). Liver Function Tests: No results for input(s): AST, ALT, ALKPHOS, BILITOT, PROT, ALBUMIN in the last 168 hours. No results for input(s): LIPASE, AMYLASE in the last 168 hours. No results for input(s): AMMONIA in the last 168 hours. Coagulation Profile: Recent Labs  Lab 08/04/24 1019  INR 1.2   Cardiac Enzymes: No results for input(s): CKTOTAL, CKMB, CKMBINDEX, TROPONINI in the last 168 hours. BNP (last 3 results) Recent Labs    08/04/24 0624  PROBNP 1,374.0*   HbA1C: No results for input(s): HGBA1C in the last 72 hours. CBG: No results for input(s): GLUCAP in the last 168 hours. Lipid Profile: No results for input(s): CHOL, HDL, LDLCALC, TRIG, CHOLHDL, LDLDIRECT in the last 72 hours. Thyroid Function Tests: No results for input(s): TSH, T4TOTAL, FREET4, T3FREE, THYROIDAB in the last 72 hours. Anemia Panel: No results for input(s): VITAMINB12, FOLATE, FERRITIN, TIBC, IRON, RETICCTPCT in the last 72 hours. Most Recent Urinalysis On File:     Component Value Date/Time   COLORURINE YELLOW (A) 01/28/2022 1254   APPEARANCEUR Hazy (A) 02/25/2023 1316   LABSPEC 1.011 01/28/2022 1254   PHURINE 5.0 01/28/2022 1254   GLUCOSEU Negative 02/25/2023 1316   HGBUR MODERATE (A) 01/28/2022 1254   BILIRUBINUR Negative 02/25/2023 1316   KETONESUR NEGATIVE 01/28/2022 1254   PROTEINUR Negative 02/25/2023 1316   PROTEINUR NEGATIVE 01/28/2022 1254   UROBILINOGEN 0.2 09/19/2022 1544   NITRITE Negative 02/25/2023 1316   NITRITE POSITIVE (A) 01/28/2022 1254   LEUKOCYTESUR 2+ (A) 02/25/2023 1316   LEUKOCYTESUR LARGE (A) 01/28/2022 1254   Sepsis Labs: @LABRCNTIP (procalcitonin:4,lacticidven:4)  Recent Results (from the past 240 hours)  Resp panel by RT-PCR (RSV, Flu A&B, Covid) Anterior Nasal Swab     Status: None   Collection Time: 08/04/24  6:24 AM   Specimen: Anterior Nasal Swab  Result Value Ref Range Status    SARS Coronavirus 2 by RT PCR NEGATIVE NEGATIVE Final    Comment: (NOTE) SARS-CoV-2 target nucleic acids are NOT DETECTED.  The SARS-CoV-2 RNA is generally detectable in upper respiratory specimens during the acute phase of infection. The lowest concentration of SARS-CoV-2 viral copies this assay can detect is 138 copies/mL. A negative result does not preclude SARS-Cov-2 infection and should not be used as the sole basis for treatment or other patient management decisions. A negative result may occur with  improper  specimen collection/handling, submission of specimen other than nasopharyngeal swab, presence of viral mutation(s) within the areas targeted by this assay, and inadequate number of viral copies(<138 copies/mL). A negative result must be combined with clinical observations, patient history, and epidemiological information. The expected result is Negative.  Fact Sheet for Patients:  bloggercourse.com  Fact Sheet for Healthcare Providers:  seriousbroker.it  This test is no t yet approved or cleared by the [place]  FDA and  has been authorized for detection and/or diagnosis of SARS-CoV-2 by FDA under an Emergency Use Authorization (EUA). This EUA will remain  in effect (meaning this test can be used) for the duration of the COVID-19 declaration under Section 564(b)(1) of the Act, 21 U.S.C.section 360bbb-3(b)(1), unless the authorization is terminated  or revoked sooner.       Influenza A by PCR NEGATIVE NEGATIVE Final   Influenza B by PCR NEGATIVE NEGATIVE Final    Comment: (NOTE) The Xpert Xpress SARS-CoV-2/FLU/RSV plus assay is intended as an aid in the diagnosis of influenza from Nasopharyngeal swab specimens and should not be used as a sole basis for treatment. Nasal washings and aspirates are unacceptable for Xpert Xpress SARS-CoV-2/FLU/RSV testing.  Fact Sheet for  Patients: bloggercourse.com  Fact Sheet for Healthcare Providers: seriousbroker.it  This test is not yet approved or cleared by the [place]  FDA and has been authorized for detection and/or diagnosis of SARS-CoV-2 by FDA under an Emergency Use Authorization (EUA). This EUA will remain in effect (meaning this test can be used) for the duration of the COVID-19 declaration under Section 564(b)(1) of the Act, 21 U.S.C. section 360bbb-3(b)(1), unless the authorization is terminated or revoked.     Resp Syncytial Virus by PCR NEGATIVE NEGATIVE Final    Comment: (NOTE) Fact Sheet for Patients: bloggercourse.com  Fact Sheet for Healthcare Providers: seriousbroker.it  This test is not yet approved or cleared by the [place]  FDA and has been authorized for detection and/or diagnosis of SARS-CoV-2 by FDA under an Emergency Use Authorization (EUA). This EUA will remain in effect (meaning this test can be used) for the duration of the COVID-19 declaration under Section 564(b)(1) of the Act, 21 U.S.C. section 360bbb-3(b)(1), unless the authorization is terminated or revoked.  Performed at [facility], [address], [place], [place] [address]          Radiology Studies: DG Chest Portable 1 View Result Date: 08/04/2024 EXAM: 1 VIEW(S) XRAY OF THE CHEST 08/04/2024 06:27:26 AM COMPARISON: 07/13/2024 and earlier CLINICAL HISTORY: 89 year old female with shortness of breath. FINDINGS: LUNGS AND PLEURA: Coarse bilateral pulmonary interstitial opacity appears chronic and stable, and emphysema confirmed on CTA chest last year. Small chronic pleural effusions, also on prior CTA, do not appear significantly changed. [name] No pneumothorax. HEART AND MEDIASTINUM: Tortuous aorta with aortic atherosclerosis. BONES AND SOFT TISSUES: Osteopenia. Thoracic spondylosis. IMPRESSION: 1.  Emphysema. Small chronic pleural effusions not significantly changed from last year. 2. No new cardiopulmonary abnormality. Electronically signed by: Helayne Hurst MD MD 08/04/2024 06:45 AM EST RP Workstation: [id number]             LOS: 0 days      Laneta Blunt, DO [facility] 08/04/2024, 4:06 PM    Dictation software may have been used to generate the above note. Typos may occur and escape review in typed/dictated notes. Please contact Dr Blunt directly for clarity if needed.  Staff may message me via secure chat in Epic  but this may not receive an immediate response,  please page me for urgent matters!  If 7PM-7AM, please contact night coverage [email or web address]          [1] (Not in an outpatient encounter) [2]  Allergies Allergen Reactions   Gentamicin    Levofloxacin Diarrhea   "

## 2024-08-04 NOTE — Consult Note (Signed)
 " [facility] CARDIOLOGY CONSULT NOTE       [name] MRN: [id number] DOB/AGE: September 30, 1932 89 y.o.  Admit date: 08/04/2024 Referring Physician Dr. Laneta Blunt Primary Physician Laurence Locus, DO  Primary Cardiologist Dr. Wilburn Reason for Consultation elevated troponin  HPI: Angela Mason is a 89 y.o. female  with a past medical history of paroxysmal atrial fibrillation, chronic HFmrEF, hypertension, hyperlipidemia, COPD, hypothyroidism who presented to the ED on 08/04/2024 for shortness of breath. Troponins found to be mildly elevated. Cardiology was consulted for further evaluation.   Patient had onset of SOB this AM when going to the bathroom, similar to prior issues with COPD. Given this she called EMS and was brought in for evaluation. Workup in the ED notable for creatinine 1.08, potassium 5.6, hemoglobin 12.7, WBC 11.0. Troponins 30 > 109 > 161, BNP 1374. EKG in the ED NSR rate 77 bpm. CXR essentially stable from prior without acute change.   At the time of my evaluation this afternoon, she is resting upright in hospital bed.  States that her shortness of breath feels little bit better now.  States that overnight when she went to the bathroom she noticed that she was feeling short of breath and felt better when she was sitting up.  States that she also had a little heaviness in her chest associate with the shortness of breath which is now resolved.  She denies any lightheadedness or dizziness.  Denies any changes to her chronic lower extremity edema.  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) (HCC)    HLD (hyperlipidemia)    HTN (hypertension)    Hypothyroidism    Macular degeneration, bilateral    Paroxysmal atrial fibrillation (HCC)    Varicosities     Past  Surgical History:  Procedure Laterality Date   CATARACT EXTRACTION W/PHACO Right 02/03/2017   Procedure: CATARACT EXTRACTION PHACO AND INTRAOCULAR LENS PLACEMENT (IOC);  Surgeon: Jaye Fallow, MD;  Location: [facility] ORS;  Service: Ophthalmology;  Laterality: Right;  US  00:57.2 AP% 18.5 CDE 10.58 Fluid pack lot # [id number]   CATARACT EXTRACTION W/PHACO Left 02/24/2017   Procedure: CATARACT EXTRACTION PHACO AND INTRAOCULAR LENS PLACEMENT (IOC);  Surgeon: Jaye Fallow, MD;  Location: [facility] ORS;  Service: Ophthalmology;  Laterality: Left;  US  01:12 AP% 25.0 CDE 18.03 Fluid pack lot # [id number]   MOHS SURGERY  2015   nose   SALIVARY GLAND SURGERY Left 2012   TONSILLECTOMY  1940    (Not in a hospital admission)  Social History   Socioeconomic History   Marital status: Widowed    Spouse name: Not on file   Number of children: Not on file   Years of education: Not on file   Highest education level: Not on file  Occupational History   Not on file  Tobacco Use   Smoking status: Former    Current packs/day: 0.00    Types: Cigarettes    Quit date: 09/05/2005    Years since quitting: 18.9   Smokeless tobacco: Never  Vaping Use   Vaping status: Never Used  Substance and Sexual Activity   Alcohol  use: Not Currently   Drug use: No   Sexual activity: Not Currently    Birth control/protection:  Post-menopausal  Other Topics Concern   Not on file  Social History Narrative   Not on file   Social Drivers of Health   Tobacco Use: Medium Risk (08/04/2024)   Patient History    Smoking Tobacco Use: Former    Smokeless Tobacco Use: Never    Passive Exposure: Not on file  Financial Resource Strain: Not on file  Food Insecurity: No Food Insecurity (10/28/2023)   Hunger Vital Sign    Worried About Running Out of Food in the Last Year: Never true    Ran Out of Food in the Last Year: Never true  Transportation Needs: No Transportation Needs (10/28/2023)   PRAPARE - Scientist, Research (physical Sciences) (Medical): No    Lack of Transportation (Non-Medical): No  Physical Activity: Not on file  Stress: Not on file  Social Connections: Socially Isolated (10/28/2023)   Social Connection and Isolation Panel    Frequency of Communication with Friends and Family: More than three times a week    Frequency of Social Gatherings with Friends and Family: Twice a week    Attends Religious Services: Never    Database Administrator or Organizations: No    Attends Banker Meetings: Never    Marital Status: Widowed  Intimate Partner Violence: Not At Risk (10/28/2023)   Humiliation, Afraid, Rape, and Kick questionnaire    Fear of Current or Ex-Partner: No    Emotionally Abused: No    Physically Abused: No    Sexually Abused: No  Depression (PHQ2-9): Low Risk (07/27/2024)   Depression (PHQ2-9)    PHQ-2 Score: 0  Alcohol  Screen: Not on file  Housing: Unknown (12/03/2023)   Received from [facility]   Epic    Unable to Pay for Housing in the Last Year: Not on file    Number of Times Moved in the Last Year: Not on file    At any time in the past 12 months, were you homeless or living in a shelter (including now)?: No  Utilities: Not At Risk (10/28/2023)   [employer] Utilities    Threatened with loss of utilities: No  Health Literacy: Not on file    Family History  Problem Relation Age of Onset   Diverticulitis Mother    Macular degeneration Mother    COPD Father    Heart failure Father    Epilepsy Sister    Parkinson's disease Brother    Cancer Neg Hx    Diabetes Neg Hx    Breast cancer Neg Hx      Vitals:   08/04/24 1100 08/04/24 1130 08/04/24 1136 08/04/24 1139  BP: (!) 104/48 (!) 109/52  (!) 109/52  Pulse: (!) 54 62    Resp: (!) 21 (!) 26    Temp:   97.9 F (36.6 C)   TempSrc:   Oral   SpO2: 94% 93%    Weight:      Height:        PHYSICAL EXAM General: Chronically ill-appearing elderly female, frail, in no acute distress. HEENT: Normocephalic  and atraumatic. Neck: No JVD.  Lungs: Normal respiratory effort on room air. Clear bilaterally to auscultation. No wheezes, crackles, rhonchi.  Heart: HRRR. Normal S1 and S2 without gallops or murmurs.  Abdomen: Non-distended appearing.  Msk: Normal strength and tone for age. Extremities: Warm and well perfused. No clubbing, cyanosis.  Chronic appearing edema.  Neuro: Alert and oriented X 3. Psych: Answers questions appropriately.   Labs: Basic  Metabolic Panel: Recent Labs    08/04/24 0624  NA 142  K 5.6*  CL 105  CO2 26  GLUCOSE 124*  BUN 29*  CREATININE 1.08*  CALCIUM  9.4   Liver Function Tests: No results for input(s): AST, ALT, ALKPHOS, BILITOT, PROT, ALBUMIN in the last 72 hours. No results for input(s): LIPASE, AMYLASE in the last 72 hours. CBC: Recent Labs    08/04/24 0624  WBC 11.0*  NEUTROABS 9.3*  HGB 12.7  HCT 40.6  MCV 101.8*  PLT 291   Cardiac Enzymes: No results for input(s): CKTOTAL, CKMB, CKMBINDEX, TROPONINIHS in the last 72 hours. BNP: No results for input(s): BNP in the last 72 hours. D-Dimer: No results for input(s): DDIMER in the last 72 hours. Hemoglobin A1C: No results for input(s): HGBA1C in the last 72 hours. Fasting Lipid Panel: No results for input(s): CHOL, HDL, LDLCALC, TRIG, CHOLHDL, LDLDIRECT in the last 72 hours. Thyroid Function Tests: No results for input(s): TSH, T4TOTAL, T3FREE, THYROIDAB in the last 72 hours.  Invalid input(s): FREET3 Anemia Panel: No results for input(s): VITAMINB12, FOLATE, FERRITIN, TIBC, IRON, RETICCTPCT in the last 72 hours.   Radiology: DG Chest Portable 1 View Result Date: 08/04/2024 EXAM: 1 VIEW(S) XRAY OF THE CHEST 08/04/2024 06:27:26 AM COMPARISON: 07/13/2024 and earlier CLINICAL HISTORY: 89 year old female with shortness of breath. FINDINGS: LUNGS AND PLEURA: Coarse bilateral pulmonary interstitial opacity appears chronic and stable,  and emphysema confirmed on CTA chest last year. Small chronic pleural effusions, also on prior CTA, do not appear significantly changed. [name] No pneumothorax. HEART AND MEDIASTINUM: Tortuous aorta with aortic atherosclerosis. BONES AND SOFT TISSUES: Osteopenia. Thoracic spondylosis. IMPRESSION: 1. Emphysema. Small chronic pleural effusions not significantly changed from last year. 2. No new cardiopulmonary abnormality. Electronically signed by: Helayne Hurst MD MD 08/04/2024 06:45 AM EST RP Workstation: [id number]   DG Chest 2 View Result Date: 07/13/2024 EXAM: 2 VIEW(S) XRAY OF THE CHEST 07/13/2024 09:58:03 AM COMPARISON: 12/03/2023 CLINICAL HISTORY: sob FINDINGS: LUNGS AND PLEURA: Decreased right pleural effusion from prior. No focal pulmonary opacity. No pneumothorax. HEART AND MEDIASTINUM: Aortic atherosclerosis. No acute abnormality of the cardiac and mediastinal silhouettes. BONES AND SOFT TISSUES: Thoracic spondylosis. IMPRESSION: 1. Decreased right pleural effusion from prior. Electronically signed by: Evalene Coho MD 07/13/2024 10:09 AM EST RP Workstation: [id number]    ECHO 01/2024: MILD LEFT VENTRICULAR SYSTOLIC DYSFUNCTION WITH MILD LVH  ESTIMATED EF: 45%, CALC EF(2D): 52%  ELEVATED LA PRESSURES WITH DIASTOLIC DYSFUNCTION (GRADE 2)  NORMAL RIGHT VENTRICULAR SYSTOLIC FUNCTION  VALVULAR REGURGITATION: MILD AR, MILD MR, MILD PR, MILD TR  ESTIMATED RVSP: 46 mmHg  VALVULAR STENOSIS: TRIVIAL AS, No MS, No PS, No TS   TELEMETRY (personally reviewed): Sinus rhythm rate 60s  EKG (personally reviewed): NSR rate 77 bpm  Data reviewed by me 08/04/2024: last 24h vitals tele labs imaging I/O ED provider note, admission H&P  Principal Problem:   COPD (chronic obstructive pulmonary disease) (HCC)    ASSESSMENT AND PLAN:  Angela Mason is a 89 y.o. female  with a past medical history of paroxysmal atrial fibrillation, chronic HFmrEF, hypertension, hyperlipidemia, COPD, hypothyroidism who  presented to the ED on 08/04/2024 for shortness of breath. Troponins found to be mildly elevated. Cardiology was consulted for further evaluation.   # COPD exacerbation # Acute on chronic HFmrEF, mild # Paroxysmal atrial fibrillation # Demand ischemia Patient called EMS for evaluation of SOB which began overnight. Improved in the ED with breathing treatment. Had some mild chest heaviness associated with SOB,  now resolved. Troponins 30 > 109 > 161.  -Will give a dose of IV lasix  40 mg and assess response.  -Continue home amiodarone  200 mg daily.  -Patient has stopped home eliquis  and declines resuming.  -Continue losartan  50 mg daily, metoprolol  succinate 12.5 mg twice daily.  -Continue pravastatin  20 mg daily.  -Suspect mild troponin most consistent with demand/supply mismatch and not ACS in the setting of COPD exacerbation. Denies CP. Given her frail status and improvement in symptoms, will recommend medical management.   This patient's plan of care was discussed and created with Dr. Florencio and he is in agreement.  Signed: Danita Bloch, PA-C  08/04/2024, 2:16 PM [facility]      "

## 2024-08-04 NOTE — Progress Notes (Signed)
 Full H&P/DC summary to follow Pt is admitted to observation, possible DC later today  S: SOB at assisted living, not helped by neb tx w/ EMS, brought to ED. Pt also reports chest tightness alleviated by deep breath. Pt reports improvement in breathing now and I feel fine   O:  ED course: CXR no PNA/edema, c/w COPD. WBC slight elevation. No BMP or troponin at time of EDP calling for admission.  SpO2 at bedside is 92-96% on room air RRR, S1S2 WNL Lungs diminshed breath sounds all fields Normal WOB No LE edema   A:   COPD exacerbation, improved  Pend BNP but does not appear fluid overloaded Pend troponin but does not appear c/w ACS  P:  Trend troponin Await labs as above  PT/OT to see Ambulate w/ and w/o O2 measuing oxygen sat - if needing prn oxygen at home daughter states her facility will be ok with this but if needing continuous may need to work on transition to LTC rather than AL Will see if we can arrange neb machine for pt to use prn  Given improvement already and ruling out ACS at this point, I anticipate possible DC back to facility later today

## 2024-08-04 NOTE — ED Provider Notes (Signed)
 "  [facility] Provider Note    Event Date/Time   First MD Initiated Contact with Patient 08/04/24 [phone]     (approximate)   History   Shortness of Breath (89 y/o F, BIBA from home for c/o shortness of breath after getting up to go to the bathroom this morning.)   HPI  Angela Mason is a 89 y.o. female with history of COPD, CHF, hypertension, hyperlipidemia, hypothyroidism, atrial fibrillation on Eliquis  who presents to the emergency department shortness of breath that started when she was coming back from the bathroom this morning.  She denies any fevers, productive cough, calf tenderness or calf swelling.  No chest pain.  Echo April 2025:  IMPRESSIONS     1. Left ventricular ejection fraction, by estimation, is 35 to 40%. The  left ventricle has moderately decreased function. The left ventricle  demonstrates regional wall motion abnormalities (see scoring  diagram/findings for description). Left ventricular   diastolic parameters are indeterminate.   2. Right ventricular systolic function is normal. The right ventricular  size is normal.   3. Left atrial size was mildly dilated.   4. Right atrial size was moderately dilated.   5. The mitral valve is normal in structure. No evidence of mitral valve  regurgitation. No evidence of mitral stenosis.   6. The aortic valve is normal in structure. Aortic valve regurgitation is  not visualized. Mild aortic valve stenosis.   7. The inferior vena cava is normal in size with greater than 50%  respiratory variability, suggesting right atrial pressure of 3 mmHg.    History provided by patient, EMS.    Past Medical History:  Diagnosis Date   Cancer (HCC)    SKIN   COPD, mild (HCC)    Dyspnea    Edema    Heart failure with mildly reduced ejection fraction (HFmrEF) (HCC)    HLD (hyperlipidemia)    HTN (hypertension)    Hypothyroidism    Macular degeneration, bilateral    Paroxysmal atrial fibrillation  (HCC)    Varicosities     Past Surgical History:  Procedure Laterality Date   CATARACT EXTRACTION W/PHACO Right 02/03/2017   Procedure: CATARACT EXTRACTION PHACO AND INTRAOCULAR LENS PLACEMENT (IOC);  Surgeon: Jaye Fallow, MD;  Location: [facility] ORS;  Service: Ophthalmology;  Laterality: Right;  US  00:57.2 AP% 18.5 CDE 10.58 Fluid pack lot # [id number]   CATARACT EXTRACTION W/PHACO Left 02/24/2017   Procedure: CATARACT EXTRACTION PHACO AND INTRAOCULAR LENS PLACEMENT (IOC);  Surgeon: Jaye Fallow, MD;  Location: [facility] ORS;  Service: Ophthalmology;  Laterality: Left;  US  01:12 AP% 25.0 CDE 18.03 Fluid pack lot # [id number]   MOHS SURGERY  2015   nose   SALIVARY GLAND SURGERY Left 2012   TONSILLECTOMY  1940    MEDICATIONS:  Prior to Admission medications  Medication Sig Start Date End Date Taking? Authorizing Provider  acetaminophen  (TYLENOL ) 325 MG tablet Take 650 mg by mouth every 4 (four) hours as needed.    [provider]  albuterol  (VENTOLIN  HFA) 108 (90 Base) MCG/ACT inhaler Inhale 2 puffs into the lungs every 4 (four) hours as needed for wheezing or shortness of breath. 10/31/23 07/28/48  Jhonny Calvin NOVAK, MD  alum & mag hydroxide-simeth (MAALOX/MYLANTA) 200-200-20 MG/5 SUSP 2 tbsp by mouth every 4 hours as needed for gas, indigestion, or upset stomach    [provider]  amiodarone  (PACERONE ) 200 MG tablet Take 200 mg by mouth daily.    [provider]  apixaban  (ELIQUIS ) 2.5 MG TABS tablet Take 2 tablets (5 mg total) by mouth 2 (two) times daily. 02/20/24   Abdul Fine, MD  ARIPiprazole  (ABILIFY ) 2 MG tablet Take 1 tablet (2 mg total) by mouth daily with breakfast. 01/31/23   Abdul Fine, MD  bismuth subsalicylate (PEPTO BISMOL) 262 MG/15ML suspension Take 30 mLs by mouth as needed.    [provider]  carbamide peroxide (DEBROX) 6.5 % OTIC solution 5 drops as needed.    [provider]  cetirizine (ZYRTEC) 5 MG tablet Take 5  mg by mouth as needed for allergies.    [provider]  dapagliflozin  propanediol (FARXIGA ) 10 MG TABS tablet Take 1 tablet (10 mg total) by mouth daily. 10/22/23   Lenon Marien CROME, MD  estradiol  (ESTRACE ) 0.1 MG/GM vaginal cream Place 1 Applicatorful vaginally 2 (two) times a week. Patient not taking: Reported on 05/31/2024 07/02/23   Connell Davies, MD  furosemide  (LASIX ) 20 MG tablet Take 20 mg by mouth daily.    [provider]  Glucose 15 GM/32ML GEL Take 1 packet by mouth as needed (for low blood sugar).    [provider]  guaifenesin  (ROBITUSSIN) 100 MG/5ML syrup 2 tsp by mouth every 4 hours as needed for coughing    [provider]  losartan  (COZAAR ) 50 MG tablet Take 50 mg by mouth daily.    [provider]  magnesium  hydroxide (MILK OF MAGNESIA) 400 MG/5ML suspension 2 tsp by mouth as needed for constipation    [provider]  metoprolol  succinate (TOPROL -XL) 25 MG 24 hr tablet Take 0.5 tablets (12.5 mg total) by mouth 2 (two) times daily. 10/31/23 07/28/48  Jhonny Calvin NOVAK, MD  mometasone -formoterol  (DULERA ) 100-5 MCG/ACT AERO Inhale 2 puffs into the lungs 2 (two) times daily.    [provider]  Multiple Vitamins-Minerals (PRESERVISION AREDS) TABS Take 1 capsule by mouth 2 (two) times daily.    [provider]  nystatin  (MYCOSTATIN /NYSTOP ) powder Apply 1 Application topically 2 (two) times daily.    [provider]  Omega-3 Fatty Acids (FISH OIL CONCENTRATE PO) Take 1 tablet by mouth daily.     [provider]  ondansetron  (ZOFRAN ) 4 MG tablet Take 4 mg by mouth as needed for nausea or vomiting.    [provider]  OXYGEN 2 lpm for dyspnea or SOB    [provider]  polyethylene glycol powder (GLYCOLAX /MIRALAX ) 17 GM/SCOOP powder Take by mouth once. 0.5 scoop one time a day every Mon, Wed, Fri    [provider]  Polyvinyl Alcohol -Povidone PF (REFRESH) 1.4-0.6 % SOLN  Place 1 drop into both eyes at bedtime.    [provider]  potassium chloride  (KLOR-CON  M) 10 MEQ tablet Take 1 tablet (10 mEq total) by mouth daily. Give with furosemide  07/27/24   Fargo, Amy E, NP  pravastatin  (PRAVACHOL ) 20 MG tablet Take 1 tablet (20 mg total) by mouth daily at 6 PM. 10/21/23   Lenon Marien CROME, MD    Physical Exam   Triage Vital Signs: ED Triage Vitals [08/04/24 0610]  Encounter Vitals Group     BP (!) 182/82     Girls Systolic BP Percentile      Girls Diastolic BP Percentile      Boys Systolic BP Percentile      Boys Diastolic BP Percentile      Pulse Rate 78     Resp 20     Temp 98 F (36.7 C)  Temp Source Oral     SpO2 94 %     Weight      Height      Head Circumference      Peak Flow      Pain Score      Pain Loc      Pain Education      Exclude from Growth Chart     Most recent vital signs: Vitals:   08/04/24 0610  BP: (!) 182/82  Pulse: 78  Resp: 20  Temp: 98 F (36.7 C)  SpO2: 94%    CONSTITUTIONAL: Alert, responds appropriately to questions.  Elderly, appears short of breath HEAD: Normocephalic, atraumatic EYES: Conjunctivae clear, pupils appear equal, sclera nonicteric ENT: normal nose; moist mucous membranes NECK: Supple, normal ROM CARD: RRR; S1 and S2 appreciated RESP: Tachypneic, diffuse inspiratory next very wheezes, diminished aeration at bases, no rhonchi or rales, speaking short sentences, sats 92% on room air at rest ABD/GI: Non-distended; soft, non-tender, no rebound, no guarding, no peritoneal signs BACK: The back appears normal EXT: Normal ROM in all joints; no deformity noted, no edema, no calf tenderness or calf swelling SKIN: Normal color for age and race; warm; no rash on exposed skin NEURO: Moves all extremities equally, normal speech PSYCH: The patient's mood and manner are appropriate.   ED Results / Procedures / Treatments   LABS: (all labs ordered are listed, but only abnormal results are  displayed) Labs Reviewed  CBC WITH DIFFERENTIAL/PLATELET - Abnormal; Notable for the following components:      Result Value   WBC 11.0 (*)    MCV 101.8 (*)    Neutro Abs 9.3 (*)    All other components within normal limits  RESP PANEL BY RT-PCR (RSV, FLU A&B, COVID)  [id number]  BASIC METABOLIC PANEL WITH GFR  PRO BRAIN NATRIURETIC PEPTIDE  TROPONIN T, HIGH SENSITIVITY     EKG:  EKG Interpretation Date/Time:  Thursday August 04 2024 06:11:43 EST Ventricular Rate:  79 PR Interval:  201 QRS Duration:  106 QT Interval:  415 QTC Calculation: 476 R Axis:   76  Text Interpretation: Sinus rhythm Repol abnrm suggests ischemia, lateral leads Confirmed by Neomi Neptune [phone]) on 08/04/2024 6:15:29 AM         RADIOLOGY: My personal review and interpretation of imaging: Chest x-ray shows chronic stable bilateral pleural effusions.  No edema.  Emphysematous changes noted.  No infiltrate.  I have personally reviewed all radiology reports.   DG Chest Portable 1 View Result Date: 08/04/2024 EXAM: 1 VIEW(S) XRAY OF THE CHEST 08/04/2024 06:27:26 AM COMPARISON: 07/13/2024 and earlier CLINICAL HISTORY: 89 year old female with shortness of breath. FINDINGS: LUNGS AND PLEURA: Coarse bilateral pulmonary interstitial opacity appears chronic and stable, and emphysema confirmed on CTA chest last year. Small chronic pleural effusions, also on prior CTA, do not appear significantly changed. [name] No pneumothorax. HEART AND MEDIASTINUM: Tortuous aorta with aortic atherosclerosis. BONES AND SOFT TISSUES: Osteopenia. Thoracic spondylosis. IMPRESSION: 1. Emphysema. Small chronic pleural effusions not significantly changed from last year. 2. No new cardiopulmonary abnormality. Electronically signed by: Helayne Hurst MD MD 08/04/2024 06:45 AM EST RP Workstation: [id number]     PROCEDURES:  Critical Care performed: Yes, see critical care procedure note(s)   CRITICAL CARE Performed by: Neptune Neomi   Total  critical care time: 30 minutes  Critical care time was exclusive of separately billable procedures and treating other patients.  Critical care was necessary to treat or prevent imminent or life-threatening deterioration.  Critical care was time spent personally by me on the following activities: development of treatment plan with patient and/or surrogate as well as nursing, discussions with consultants, evaluation of patient's response to treatment, examination of patient, obtaining history from patient or surrogate, ordering and performing treatments and interventions, ordering and review of laboratory studies, ordering and review of radiographic studies, pulse oximetry and re-evaluation of patient's condition.   [name]1-3 Lead EKG Interpretation  Performed by: Naeem Quillin, Josette SAILOR, DO Authorized by: Owin Vignola, Josette SAILOR, DO     Interpretation: normal     ECG rate:  78   ECG rate assessment: normal     Rhythm: sinus rhythm     Ectopy: none     Conduction: normal       IMPRESSION / MDM / ASSESSMENT AND PLAN / ED COURSE  I reviewed the triage vital signs and the nursing notes.    Patient here with shortness of breath and wheezing.  History of COPD and CHF.  The patient is on the cardiac monitor to evaluate for evidence of arrhythmia and/or significant heart rate changes.   DIFFERENTIAL DIAGNOSIS (includes but not limited to):   COPD exacerbation, CHF exacerbation, pneumonia, viral URI, less likely pneumothorax, PE, ACS   Patient's presentation is most consistent with acute presentation with potential threat to life or bodily function.   PLAN: Will obtain labs, chest x-ray, COVID and flu swab.  Will give breathing treatments, IV Solu-Medrol .   MEDICATIONS GIVEN IN ED: Medications  ipratropium-albuterol  (DUONEB) 0.5-2.5 (3) MG/3ML nebulizer solution 3 mL (3 mLs Nebulization Not Given 08/04/24 0720)  ipratropium-albuterol  (DUONEB) 0.5-2.5 (3) MG/3ML nebulizer solution 3 mL (has no  administration in time range)  acetaminophen  (TYLENOL ) tablet 650 mg (has no administration in time range)  amiodarone  (PACERONE ) tablet 200 mg (has no administration in time range)  apixaban  (ELIQUIS ) tablet 5 mg (has no administration in time range)  ARIPiprazole  (ABILIFY ) tablet 2 mg (has no administration in time range)  loratadine  (CLARITIN ) tablet 10 mg (has no administration in time range)  furosemide  (LASIX ) tablet 20 mg (has no administration in time range)  losartan  (COZAAR ) tablet 50 mg (has no administration in time range)  metoprolol  succinate (TOPROL -XL) 24 hr tablet 12.5 mg (has no administration in time range)  ondansetron  (ZOFRAN ) tablet 4 mg (has no administration in time range)  potassium chloride  SA (KLOR-CON  M) CR tablet 10 mEq (has no administration in time range)  pravastatin  (PRAVACHOL ) tablet 20 mg (has no administration in time range)  methylPREDNISolone  sodium succinate (SOLU-MEDROL ) 125 mg/2 mL injection 125 mg (125 mg Intravenous Given 08/04/24 0624)     ED COURSE: Chest x-ray reviewed and interpreted by myself and the radiologist and shows chronic and stable bilateral pleural effusions.  No acute edema, infiltrate.  Mild leukocytosis with left shift.  No fever here.  COVID, flu and RSV negative.  Will discuss with hospitalist for admission for COPD exacerbation.   CONSULTS:  Consulted and discussed patient's case with hospitalist, Dr. Marsa.  I have recommended admission and consulting physician agrees and will place admission orders.  Patient (and family if present) agree with this plan.   I reviewed all nursing notes, vitals, pertinent previous records.  All labs, EKGs, imaging ordered have been independently reviewed and interpreted by myself.    OUTSIDE RECORDS REVIEWED: Reviewed most recent admissions for COPD and CHF exacerbation.       FINAL CLINICAL IMPRESSION(S) / ED DIAGNOSES   Final diagnoses:  COPD exacerbation (HCC)  Rx / DC  Orders   ED Discharge Orders     None        Note:  This document was prepared using Dragon voice recognition software and may include unintentional dictation errors.   Zari Cly, Josette SAILOR, DO 08/04/24 [phone]  "

## 2024-08-05 ENCOUNTER — Other Ambulatory Visit: Payer: Self-pay

## 2024-08-05 DIAGNOSIS — J449 Chronic obstructive pulmonary disease, unspecified: Secondary | ICD-10-CM | POA: Diagnosis not present

## 2024-08-05 DIAGNOSIS — J441 Chronic obstructive pulmonary disease with (acute) exacerbation: Secondary | ICD-10-CM | POA: Diagnosis not present

## 2024-08-05 LAB — BASIC METABOLIC PANEL WITH GFR
Anion gap: 10 (ref 5–15)
BUN: 32 mg/dL — ABNORMAL HIGH (ref 8–23)
CO2: 29 mmol/L (ref 22–32)
Calcium: 8.9 mg/dL (ref 8.9–10.3)
Chloride: 102 mmol/L (ref 98–111)
Creatinine, Ser: 0.92 mg/dL (ref 0.44–1.00)
GFR, Estimated: 59 mL/min — ABNORMAL LOW
Glucose, Bld: 126 mg/dL — ABNORMAL HIGH (ref 70–99)
Potassium: 3.8 mmol/L (ref 3.5–5.1)
Sodium: 141 mmol/L (ref 135–145)

## 2024-08-05 LAB — CBC
HCT: 34.7 % — ABNORMAL LOW (ref 36.0–46.0)
Hemoglobin: 11.4 g/dL — ABNORMAL LOW (ref 12.0–15.0)
MCH: 32.1 pg (ref 26.0–34.0)
MCHC: 32.9 g/dL (ref 30.0–36.0)
MCV: 97.7 fL (ref 80.0–100.0)
Platelets: 247 K/uL (ref 150–400)
RBC: 3.55 MIL/uL — ABNORMAL LOW (ref 3.87–5.11)
RDW: 14.8 % (ref 11.5–15.5)
WBC: 13.2 K/uL — ABNORMAL HIGH (ref 4.0–10.5)
nRBC: 0 % (ref 0.0–0.2)

## 2024-08-05 LAB — APTT: aPTT: 78 s — ABNORMAL HIGH (ref 24–36)

## 2024-08-05 LAB — HEPARIN LEVEL (UNFRACTIONATED): Heparin Unfractionated: 0.93 [IU]/mL — ABNORMAL HIGH (ref 0.30–0.70)

## 2024-08-05 MED ORDER — PREDNISONE 20 MG PO TABS
40.0000 mg | ORAL_TABLET | Freq: Every day | ORAL | 0 refills | Status: AC
  Filled 2024-08-05: qty 6, 3d supply, fill #0

## 2024-08-05 MED ORDER — IPRATROPIUM-ALBUTEROL 0.5-2.5 (3) MG/3ML IN SOLN
3.0000 mL | RESPIRATORY_TRACT | 0 refills | Status: AC | PRN
  Filled 2024-08-05: qty 90, 3d supply, fill #0

## 2024-08-05 MED ORDER — ENSURE PLUS HIGH PROTEIN PO LIQD: 237.0000 mL | Freq: Two times a day (BID) | ORAL | Status: DC

## 2024-08-05 MED ORDER — IPRATROPIUM-ALBUTEROL 0.5-2.5 (3) MG/3ML IN SOLN
3.0000 mL | Freq: Two times a day (BID) | RESPIRATORY_TRACT | Status: DC
  Administered 2024-08-05: 3 mL via RESPIRATORY_TRACT
  Filled 2024-08-05: qty 3

## 2024-08-05 MED ORDER — APIXABAN 2.5 MG PO TABS
2.5000 mg | ORAL_TABLET | Freq: Two times a day (BID) | ORAL | 0 refills | Status: AC
  Filled 2024-08-05: qty 60, 30d supply, fill #0

## 2024-08-05 MED ORDER — APIXABAN 2.5 MG PO TABS
2.5000 mg | ORAL_TABLET | Freq: Two times a day (BID) | ORAL | Status: DC
  Administered 2024-08-05: 2.5 mg via ORAL
  Filled 2024-08-05: qty 1

## 2024-08-05 NOTE — Progress Notes (Signed)
 " [facility] CARDIOLOGY CONSULT NOTE       [name] MRN: [id number] DOB/AGE: [birth year] 89 y.o.  Admit date: 08/04/2024 Referring Physician Dr. Laneta Blunt Primary Physician Laurence Locus, DO  Primary Cardiologist Dr. Wilburn Reason for Consultation elevated troponin  HPI: Angela Mason is a 89 y.o. female  with a past medical history of paroxysmal atrial fibrillation, chronic HFmrEF, hypertension, hyperlipidemia, COPD, hypothyroidism who presented to the ED on 08/04/2024 for shortness of breath. Troponins found to be mildly elevated. Cardiology was consulted for further evaluation.   Interval history: - Patient seen and examined this morning, resting comfortably in bedside chair. - She states that she is feeling overall well today with no chest discomfort and feels that her shortness of breath is at baseline. - BP and heart rate stable and no events noted on telemetry.  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) (HCC)    HLD (hyperlipidemia)    HTN (hypertension)    Hypothyroidism    Macular degeneration, bilateral    Paroxysmal atrial fibrillation (HCC)    Varicosities     Past Surgical History:  Procedure Laterality Date   CATARACT EXTRACTION W/PHACO Right 02/03/2017   Procedure: CATARACT EXTRACTION PHACO AND INTRAOCULAR LENS PLACEMENT (IOC);  Surgeon: Jaye Fallow, MD;  Location: [facility] ORS;  Service: Ophthalmology;  Laterality: Right;  US  00:57.2 AP% 18.5 CDE 10.58 Fluid pack lot # [id number]   CATARACT EXTRACTION W/PHACO Left 02/24/2017   Procedure: CATARACT EXTRACTION PHACO AND INTRAOCULAR LENS PLACEMENT (IOC);  Surgeon: Jaye Fallow, MD;  Location: [facility] ORS;  Service: Ophthalmology;  Laterality: Left;  US  01:12 AP% 25.0 CDE 18.03 Fluid pack  lot # [id number]   MOHS SURGERY  2015   nose   SALIVARY GLAND SURGERY Left 2012   TONSILLECTOMY  1940    Medications Prior to Admission  Medication Sig Dispense Refill Last Dose/Taking   acetaminophen  (TYLENOL ) 325 MG tablet Take 650 mg by mouth every 4 (four) hours as needed.   Unknown   albuterol  (VENTOLIN  HFA) 108 (90 Base) MCG/ACT inhaler Inhale 2 puffs into the lungs every 4 (four) hours as needed for wheezing or shortness of breath. 1 each 1 Unknown   alum & mag hydroxide-simeth (MAALOX/MYLANTA) 200-200-20 MG/5 SUSP 2 tbsp by mouth every 4 hours as needed for gas, indigestion, or upset stomach   Unknown   amiodarone  (PACERONE ) 200 MG tablet Take 200 mg by mouth daily.   08/03/2024   ARIPiprazole  (ABILIFY ) 2 MG tablet Take 1 tablet (2 mg total) by mouth daily with breakfast. 45 tablet 0 08/03/2024   bismuth subsalicylate (PEPTO BISMOL) 262 MG/15ML suspension Take 30 mLs by mouth as needed.   Unknown   carbamide peroxide (DEBROX) 6.5 % OTIC solution 5 drops as needed.   Unknown   cetirizine (ZYRTEC) 5 MG tablet Take 5 mg by mouth as needed for allergies.   Unknown   dapagliflozin  propanediol (FARXIGA ) 10 MG TABS tablet Take 1 tablet (10 mg total) by mouth daily.   08/03/2024   furosemide  (LASIX ) 20 MG tablet Take 20 mg by mouth daily.   08/03/2024   Glucose 15 GM/32ML GEL Take 1 packet by mouth as needed (for low blood sugar).   Unknown  guaifenesin  (ROBITUSSIN) 100 MG/5ML syrup 2 tsp by mouth every 4 hours as needed for coughing   Unknown   losartan  (COZAAR ) 50 MG tablet Take 50 mg by mouth daily.   08/03/2024   magnesium  hydroxide (MILK OF MAGNESIA) 400 MG/5ML suspension 2 tsp by mouth as needed for constipation   Unknown   metoprolol  succinate (TOPROL -XL) 25 MG 24 hr tablet Take 0.5 tablets (12.5 mg total) by mouth 2 (two) times daily. 30 tablet 0 08/03/2024   mometasone -formoterol  (DULERA ) 100-5 MCG/ACT AERO Inhale 2 puffs into the lungs 2 (two) times daily.   08/03/2024   montelukast  (SINGULAIR ) 10  MG tablet Take 10 mg by mouth at bedtime.   08/03/2024   Multiple Vitamins-Minerals (PRESERVISION AREDS) TABS Take 1 capsule by mouth 2 (two) times daily.   08/03/2024   nystatin  (MYCOSTATIN /NYSTOP ) powder Apply 1 Application topically 2 (two) times daily.   08/03/2024   Omega-3 Fatty Acids (FISH OIL CONCENTRATE PO) Take 1 tablet by mouth daily.    08/03/2024   ondansetron  (ZOFRAN ) 4 MG tablet Take 4 mg by mouth as needed for nausea or vomiting.   Unknown   polyethylene glycol powder (GLYCOLAX /MIRALAX ) 17 GM/SCOOP powder Take by mouth once. 0.5 scoop one time a day every Mon, Wed, Fri   08/03/2024   Polyvinyl Alcohol -Povidone PF (REFRESH) 1.4-0.6 % SOLN Place 1 drop into both eyes at bedtime.   08/03/2024   potassium chloride  (KLOR-CON  M) 10 MEQ tablet Take 1 tablet (10 mEq total) by mouth daily. Give with furosemide    08/03/2024   pravastatin  (PRAVACHOL ) 20 MG tablet Take 1 tablet (20 mg total) by mouth daily at 6 PM.   08/03/2024   apixaban  (ELIQUIS ) 2.5 MG TABS tablet Take 2 tablets (5 mg total) by mouth 2 (two) times daily. (Patient not taking: Reported on 08/04/2024)   Not Taking   estradiol  (ESTRACE ) 0.1 MG/GM vaginal cream Place 1 Applicatorful vaginally 2 (two) times a week. (Patient not taking: No sig reported) 42.5 g 1    OXYGEN 2 lpm for dyspnea or SOB      Social History   Socioeconomic History   Marital status: Widowed    Spouse name: Not on file   Number of children: Not on file   Years of education: Not on file   Highest education level: Not on file  Occupational History   Not on file  Tobacco Use   Smoking status: Former    Current packs/day: 0.00    Types: Cigarettes    Quit date: 09/05/2005    Years since quitting: 18.9   Smokeless tobacco: Never  Vaping Use   Vaping status: Never Used  Substance and Sexual Activity   Alcohol  use: Not Currently   Drug use: No   Sexual activity: Not Currently    Birth control/protection: Post-menopausal  Other Topics Concern   Not on file  Social  History Narrative   Not on file   Social Drivers of Health   Tobacco Use: Medium Risk (08/04/2024)   Patient History    Smoking Tobacco Use: Former    Smokeless Tobacco Use: Never    Passive Exposure: Not on Actuary Strain: Not on file  Food Insecurity: No Food Insecurity (08/04/2024)   Epic    Worried About Radiation Protection Practitioner of Food in the Last Year: Never true    Ran Out of Food in the Last Year: Never true  Transportation Needs: No Transportation Needs (08/04/2024)   Epic    Lack  of Transportation (Medical): No    Lack of Transportation (Non-Medical): No  Physical Activity: Not on file  Stress: Not on file  Social Connections: Socially Isolated (08/04/2024)   Social Connection and Isolation Panel    Frequency of Communication with Friends and Family: More than three times a week    Frequency of Social Gatherings with Friends and Family: Twice a week    Attends Religious Services: Never    Database Administrator or Organizations: No    Attends Banker Meetings: Never    Marital Status: Widowed  Intimate Partner Violence: Not At Risk (08/04/2024)   Epic    Fear of Current or Ex-Partner: No    Emotionally Abused: No    Physically Abused: No    Sexually Abused: No  Depression (PHQ2-9): Low Risk (07/27/2024)   Depression (PHQ2-9)    PHQ-2 Score: 0  Alcohol  Screen: Not on file  Housing: Low Risk (08/04/2024)   Epic    Unable to Pay for Housing in the Last Year: No    Number of Times Moved in the Last Year: 0    Homeless in the Last Year: No  Utilities: Not At Risk (08/04/2024)   Epic    Threatened with loss of utilities: No  Health Literacy: Not on file    Family History  Problem Relation Age of Onset   Diverticulitis Mother    Macular degeneration Mother    COPD Father    Heart failure Father    Epilepsy Sister    Parkinson's disease Brother    Cancer Neg Hx    Diabetes Neg Hx    Breast cancer Neg Hx      Vitals:   08/05/24 0335 08/05/24 0639  08/05/24 0718 08/05/24 1116  BP: (!) 120/56 136/83 131/66 (!) 116/57  Pulse: 67 93 64 75  Resp: 16 16 17    Temp: (!) 97.3 F (36.3 C) 97.8 F (36.6 C) (!) 97.5 F (36.4 C) 98.5 F (36.9 C)  TempSrc: Oral Oral Oral   SpO2: 93% 97% 98% 94%  Weight:  55.9 kg    Height:  5' (1.524 m)      PHYSICAL EXAM General: Chronically ill-appearing elderly female, frail, in no acute distress. HEENT: Normocephalic and atraumatic. Neck: No JVD.  Lungs: Normal respiratory effort on room air. Clear bilaterally to auscultation. No wheezes, crackles, rhonchi.  Heart: HRRR. Normal S1 and S2 without gallops or murmurs.  Abdomen: Non-distended appearing.  Msk: Normal strength and tone for age. Extremities: Warm and well perfused. No clubbing, cyanosis.  Chronic appearing edema.  Neuro: Alert and oriented X 3. Psych: Answers questions appropriately.   Labs: Basic Metabolic Panel: Recent Labs    08/04/24 0624 08/05/24 0438  NA 142 141  K 5.6* 3.8  CL 105 102  CO2 26 29  GLUCOSE 124* 126*  BUN 29* 32*  CREATININE 1.08* 0.92  CALCIUM  9.4 8.9   Liver Function Tests: No results for input(s): AST, ALT, ALKPHOS, BILITOT, PROT, ALBUMIN in the last 72 hours. No results for input(s): LIPASE, AMYLASE in the last 72 hours. CBC: Recent Labs    08/04/24 0624 08/05/24 0438  WBC 11.0* 13.2*  NEUTROABS 9.3*  --   HGB 12.7 11.4*  HCT 40.6 34.7*  MCV 101.8* 97.7  PLT 291 247   Cardiac Enzymes: No results for input(s): CKTOTAL, CKMB, CKMBINDEX, TROPONINIHS in the last 72 hours. BNP: No results for input(s): BNP in the last 72 hours. D-Dimer: No results for  input(s): DDIMER in the last 72 hours. Hemoglobin A1C: No results for input(s): HGBA1C in the last 72 hours. Fasting Lipid Panel: No results for input(s): CHOL, HDL, LDLCALC, TRIG, CHOLHDL, LDLDIRECT in the last 72 hours. Thyroid Function Tests: No results for input(s): TSH, T4TOTAL, T3FREE,  THYROIDAB in the last 72 hours.  Invalid input(s): FREET3 Anemia Panel: No results for input(s): VITAMINB12, FOLATE, FERRITIN, TIBC, IRON, RETICCTPCT in the last 72 hours.   Radiology: DG Chest Portable 1 View Result Date: 08/04/2024 EXAM: 1 VIEW(S) XRAY OF THE CHEST 08/04/2024 06:27:26 AM COMPARISON: 07/13/2024 and earlier CLINICAL HISTORY: 89 year old female with shortness of breath. FINDINGS: LUNGS AND PLEURA: Coarse bilateral pulmonary interstitial opacity appears chronic and stable, and emphysema confirmed on CTA chest last year. Small chronic pleural effusions, also on prior CTA, do not appear significantly changed. [name] No pneumothorax. HEART AND MEDIASTINUM: Tortuous aorta with aortic atherosclerosis. BONES AND SOFT TISSUES: Osteopenia. Thoracic spondylosis. IMPRESSION: 1. Emphysema. Small chronic pleural effusions not significantly changed from last year. 2. No new cardiopulmonary abnormality. Electronically signed by: Helayne Hurst MD MD 08/04/2024 06:45 AM EST RP Workstation: [id number]   DG Chest 2 View Result Date: 07/13/2024 EXAM: 2 VIEW(S) XRAY OF THE CHEST 07/13/2024 09:58:03 AM COMPARISON: 12/03/2023 CLINICAL HISTORY: sob FINDINGS: LUNGS AND PLEURA: Decreased right pleural effusion from prior. No focal pulmonary opacity. No pneumothorax. HEART AND MEDIASTINUM: Aortic atherosclerosis. No acute abnormality of the cardiac and mediastinal silhouettes. BONES AND SOFT TISSUES: Thoracic spondylosis. IMPRESSION: 1. Decreased right pleural effusion from prior. Electronically signed by: Evalene Coho MD 07/13/2024 10:09 AM EST RP Workstation: [id number]    ECHO 01/2024: MILD LEFT VENTRICULAR SYSTOLIC DYSFUNCTION WITH MILD LVH  ESTIMATED EF: 45%, CALC EF(2D): 52%  ELEVATED LA PRESSURES WITH DIASTOLIC DYSFUNCTION (GRADE 2)  NORMAL RIGHT VENTRICULAR SYSTOLIC FUNCTION  VALVULAR REGURGITATION: MILD AR, MILD MR, MILD PR, MILD TR  ESTIMATED RVSP: 46 mmHg  VALVULAR STENOSIS: TRIVIAL  AS, No MS, No PS, No TS   TELEMETRY (personally reviewed): Sinus rhythm rate 70s  EKG (personally reviewed): NSR rate 77 bpm  Data reviewed by me 08/05/2024: last 24h vitals tele labs imaging I/O ED provider note, admission H&P hospitalist progress note  Principal Problem:   COPD (chronic obstructive pulmonary disease) (HCC) Active Problems:   HLD (hyperlipidemia)   HTN (hypertension)   MDD (major depressive disorder), recurrent episode, moderate (HCC)   Protein-calorie malnutrition, severe   Elevated troponin   History of atrial fibrillation    ASSESSMENT AND PLAN:  Angela Mason is a 89 y.o. female  with a past medical history of paroxysmal atrial fibrillation, chronic HFmrEF, hypertension, hyperlipidemia, COPD, hypothyroidism who presented to the ED on 08/04/2024 for shortness of breath. Troponins found to be mildly elevated. Cardiology was consulted for further evaluation.   # COPD exacerbation # Acute on chronic HFmrEF, mild # Paroxysmal atrial fibrillation # Demand ischemia Patient called EMS for evaluation of SOB which began overnight. Improved in the ED with breathing treatment. Had some mild chest heaviness associated with SOB, now resolved and without recurrence. Troponins 30 > 109 > 161.  -Continue p.o. Lasix  20 mg daily -Continue home amiodarone  200 mg daily.  - Will stop heparin  and start low-dose Eliquis  2.5 mg twice daily given weight and age. -Continue losartan  50 mg daily, metoprolol  succinate 12.5 mg twice daily.  -Continue pravastatin  20 mg daily.  -Suspect mild troponin most consistent with demand/supply mismatch and not ACS in the setting of COPD exacerbation. Denies CP. Given her frail status and improvement  in symptoms, will recommend medical management.  Ok for discharge today from a cardiac perspective. Will arrange for follow up in clinic with Dr. Wilburn in 1-2 weeks.    This patient's plan of care was discussed and created with Dr. Florencio and he is in  agreement.  Signed: Danita Bloch, PA-C  08/05/2024, 11:19 AM [facility] Cardiology      "

## 2024-08-05 NOTE — Plan of Care (Signed)

## 2024-08-05 NOTE — Progress Notes (Signed)
 PHARMACY - ANTICOAGULATION CONSULT NOTE  Pharmacy Consult for heparin  Indication: chest pain/ACS  Allergies[1]  Patient Measurements: Height: 5' (152.4 cm) Weight: 43.9 kg (96 lb 12.5 oz) IBW/kg (Calculated) : 45.5 HEPARIN  DW (KG): 43.9  Vital Signs: Temp: 97.3 F (36.3 C) (01/09 0335) Temp Source: Oral (01/09 0335) BP: 120/56 (01/09 0335) Pulse Rate: 67 (01/09 0335)  Labs: Recent Labs    08/04/24 0624 08/04/24 1019 08/04/24 1910 08/05/24 0438  HGB 12.7  --   --  11.4*  HCT 40.6  --   --  34.7*  PLT 291  --   --  247  APTT  --  34 63* 78*  LABPROT  --  16.1*  --   --   INR  --  1.2  --   --   HEPARINUNFRC  --  >1.10*  --  0.93*  CREATININE 1.08*  --   --  0.92    Estimated Creatinine Clearance: 28.7 mL/min (by C-G formula based on SCr of 0.92 mg/dL).   Medical History: Past Medical History:  Diagnosis Date   Cancer (HCC)    SKIN   COPD, mild (HCC)    Dyspnea    Edema    Heart failure with mildly reduced ejection fraction (HFmrEF) (HCC)    HLD (hyperlipidemia)    HTN (hypertension)    Hypothyroidism    Macular degeneration, bilateral    Paroxysmal atrial fibrillation (HCC)    Varicosities    Assessment: 89 y/o female presenting with chest tightness, found to have elevated troponin level in ED. PMH significant for COPD, CHF, hypertension, hyperlipidemia, hypothyroidism, atrial fibrillation. Pharmacy has been consulted to initiate heparin  infusion. Per patient, she was not taking her Eliquis  prior to admission - will check baseline heparin  level.  1/8: Baseline labs: hgb 12.7, plt 291, INR, HL > 1.10, aPTT 34, INR 1.2.  Goal of Therapy:  Heparin  level 0.3-0.7 units/ml aPTT 66-102 seconds Monitor platelets by anticoagulation protocol: Yes  Date  Time   aPTT   Comment 1/8     1910    63       SUBtherapeutic 1/9     0438    78       Therapeutic x 1 / HL 0.93   Plan:  Continue heparin  infusion rate at 550 units/hr Recheck aPTT in 8 hours to  confirm Baseline heparin  level elevated - will monitor heparin  infusion using aPTT levels until correlating with heparin  levels Monitor daily CBC and signs/symptoms of bleeding  Thank you for involving pharmacy in this patient's care.   Rankin CANDIE Dills, PharmD, [facility] 08/05/2024 6:30 AM       [1]  Allergies Allergen Reactions   Gentamicin    Levofloxacin Diarrhea

## 2024-08-05 NOTE — NC FL2 (Signed)
 " [place]  MEDICAID [id number] LEVEL OF CARE FORM     IDENTIFICATION  Patient Name: Angela Mason Birthdate: 1932-08-24 Sex: female Admission Date (Current Location): 08/04/2024  [facility] and [place] Number:  Chiropodist and Address:  [facility], [address] [place], [place] [address]      Provider Number:    Attending Physician Name and Address:  Marsa Edelman, DO  Relative Name and Phone Number:  Cumbee,Carolyn  Friend, Emergency Contact  [phone] (Mobile)    Current Level of Care:  (ALF) Recommended Level of Care: Assisted Living Facility Prior Approval Number:    Date Approved/Denied:   PASRR Number:    Discharge Plan:  (ALF)    Current Diagnoses: Patient Active Problem List   Diagnosis Date Noted   Elevated troponin 08/04/2024   History of atrial fibrillation 08/04/2024   Afib (HCC) 10/28/2023   Myocardial injury 10/17/2023   Acute paranoid reaction (HCC) 09/12/2022   Constipation by delayed colonic transit 08/06/2022   Pedal edema 08/06/2022   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) 05/12/2022   HLD (hyperlipidemia) 04/30/2022   Hypothyroidism 04/30/2022   Other symptoms and signs involving cognitive functions and awareness 03/03/2022   History of falling 02/01/2022   Muscle weakness (generalized) 02/01/2022   Need for assistance with personal care 02/01/2022   Other abnormalities of gait and mobility 02/01/2022   Other specified fracture of left pubis, subsequent encounter for fracture with routine healing 02/01/2022   Personal history of other malignant neoplasm of skin 02/01/2022   Protein-calorie malnutrition, severe 01/29/2022   Closed fracture of superior ramus of left pubis (HCC) 01/27/2022   Leukocytosis 01/27/2022   COPD (chronic obstructive pulmonary disease) (HCC) 01/27/2022   MDD (major depressive disorder), recurrent episode, moderate (HCC)  07/31/2021   Insomnia 07/31/2021   History of delirium 03/05/2021   Unspecified macular degeneration 10/16/2020   Severe major depression, single episode, with psychotic features (HCC) 10/12/2020   HTN (hypertension)    Hyponatremia    Underweight 08/08/2020   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 08/04/2018   Cystocele, midline 05/05/2017   Nocturia 10/07/2016   Unstable bladder 10/07/2016   Vaginal atrophy 01/23/2016   Procidentia of uterus 11/01/2015    Orientation RESPIRATION BLADDER Height & Weight     Self, Time, Situation, Place  Normal Incontinent Weight: 55.9 kg Height:  5' (152.4 cm)  BEHAVIORAL SYMPTOMS/MOOD NEUROLOGICAL BOWEL NUTRITION STATUS      Continent Diet (2 gram sodium diet)  AMBULATORY STATUS COMMUNICATION OF NEEDS Skin   Limited Assist Verbally Normal                       Personal Care Assistance Level of Assistance  Bathing, Dressing Bathing Assistance: Limited assistance   Dressing Assistance: Limited assistance     Functional Limitations Info             SPECIAL CARE FACTORS FREQUENCY                       Contractures Contractures Info: Not present    Additional Factors Info                  Current Medications (08/05/2024):  This is the current hospital active medication list Current Facility-Administered Medications  Medication Dose Route Frequency Provider Last Rate Last Admin   acetaminophen  (TYLENOL ) tablet 650 mg  650 mg Oral Q4H PRN  Alexander, Natalie, DO       albuterol  (PROVENTIL ) (2.5 MG/3ML) 0.083% nebulizer solution 2.5 mg  2.5 mg Nebulization Q2H PRN Alexander, Natalie, DO       amiodarone  (PACERONE ) tablet 200 mg  200 mg Oral Daily Alexander, Natalie, DO   200 mg at 08/05/24 9071   apixaban  (ELIQUIS ) tablet 2.5 mg  2.5 mg Oral BID Hudson, Caralyn, PA-C       ARIPiprazole  (ABILIFY ) tablet 2 mg  2 mg Oral Q breakfast Alexander, Natalie, DO   2 mg at 08/05/24 9071   bisacodyl   (DULCOLAX) suppository 10 mg  10 mg Rectal Daily PRN Alexander, Natalie, DO       dapagliflozin  propanediol (FARXIGA ) tablet 10 mg  10 mg Oral Daily Alexander, Natalie, DO   10 mg at 08/05/24 9070   feeding supplement (ENSURE PLUS HIGH PROTEIN) liquid 237 mL  237 mL Oral BID BM Alexander, Natalie, DO       furosemide  (LASIX ) tablet 20 mg  20 mg Oral Daily Hudson, Caralyn, PA-C   20 mg at 08/05/24 9070   ipratropium-albuterol  (DUONEB) 0.5-2.5 (3) MG/3ML nebulizer solution 3 mL  3 mL Nebulization BID Alexander, Natalie, DO   3 mL at 08/05/24 9273   loratadine  (CLARITIN ) tablet 10 mg  10 mg Oral Daily Alexander, Natalie, DO   10 mg at 08/05/24 9071   losartan  (COZAAR ) tablet 50 mg  50 mg Oral Daily Alexander, Natalie, DO   50 mg at 08/05/24 9070   metoprolol  succinate (TOPROL -XL) 24 hr tablet 12.5 mg  12.5 mg Oral BID Alexander, Natalie, DO   12.5 mg at 08/05/24 9071   montelukast  (SINGULAIR ) tablet 10 mg  10 mg Oral QHS Alexander, Natalie, DO   10 mg at 08/04/24 2120   ondansetron  (ZOFRAN ) tablet 4 mg  4 mg Oral PRN Alexander, Natalie, DO       polyethylene glycol (MIRALAX  / GLYCOLAX ) packet 17 g  17 g Oral Daily Alexander, Natalie, DO   17 g at 08/04/24 1722   potassium chloride  (KLOR-CON  M) CR tablet 10 mEq  10 mEq Oral Daily Alexander, Natalie, DO   10 mEq at 08/05/24 9070   pravastatin  (PRAVACHOL ) tablet 20 mg  20 mg Oral q1800 Alexander, Natalie, DO   20 mg at 08/04/24 1722   predniSONE  (DELTASONE ) tablet 40 mg  40 mg Oral Q breakfast Alexander, Natalie, DO   40 mg at 08/05/24 9070   sodium phosphate (FLEET) enema 1 enema  1 enema Rectal Daily PRN Alexander, Natalie, DO         Discharge Medications: Please see discharge summary for a list of discharge medications.  Relevant Imaging Results:  Relevant Lab Results:   Additional Information    Grayce JAYSON Perfect, RN     "

## 2024-08-05 NOTE — [place] FL2 (Signed)
 "   MEDICAID FL2 LEVEL OF CARE FORM     IDENTIFICATION  Patient Name: Angela Mason Birthdate: [birth year] Sex: female Admission Date (Current Location): 08/04/2024  [facility] and [place] Number:  Chiropodist and Address:  [facility], [address] [place], [place] [address]      Provider Number: [id number]  Attending Physician Name and Address:  Marsa Edelman, DO  Relative Name and Phone Number:  Cumbee,Carolyn  Friend, Emergency Contact  [phone]    Current Level of Care: Hospital Recommended Level of Care: Skilled Nursing Facility Prior Approval Number:    Date Approved/Denied:   PASRR Number: [id number]  Discharge Plan: SNF    Current Diagnoses: Patient Active Problem List   Diagnosis Date Noted   Elevated troponin 08/04/2024   History of atrial fibrillation 08/04/2024   Afib (HCC) 10/28/2023   Myocardial injury 10/17/2023   Acute paranoid reaction (HCC) 09/12/2022   Constipation by delayed colonic transit 08/06/2022   Pedal edema 08/06/2022   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) 05/12/2022   HLD (hyperlipidemia) 04/30/2022   Hypothyroidism 04/30/2022   Other symptoms and signs involving cognitive functions and awareness 03/03/2022   History of falling 02/01/2022   Muscle weakness (generalized) 02/01/2022   Need for assistance with personal care 02/01/2022   Other abnormalities of gait and mobility 02/01/2022   Other specified fracture of left pubis, subsequent encounter for fracture with routine healing 02/01/2022   Personal history of other malignant neoplasm of skin 02/01/2022   Protein-calorie malnutrition, severe 01/29/2022   Closed fracture of superior ramus of left pubis (HCC) 01/27/2022   Leukocytosis 01/27/2022   COPD (chronic obstructive pulmonary disease) (HCC) 01/27/2022   MDD (major depressive disorder), recurrent episode, moderate  (HCC) 07/31/2021   Insomnia 07/31/2021   History of delirium 03/05/2021   Unspecified macular degeneration 10/16/2020   Severe major depression, single episode, with psychotic features (HCC) 10/12/2020   HTN (hypertension)    Hyponatremia    Underweight 08/08/2020   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 08/04/2018   Cystocele, midline 05/05/2017   Nocturia 10/07/2016   Unstable bladder 10/07/2016   Vaginal atrophy 01/23/2016   Procidentia of uterus 11/01/2015    Orientation RESPIRATION BLADDER Height & Weight     Self, Time, Situation, Place  Normal Incontinent Weight: 55.9 kg Height:  5' (152.4 cm)  BEHAVIORAL SYMPTOMS/MOOD NEUROLOGICAL BOWEL NUTRITION STATUS      Continent Diet (2gm NA)  AMBULATORY STATUS COMMUNICATION OF NEEDS Skin   Limited Assist Verbally Normal                       Personal Care Assistance Level of Assistance  Bathing, Feeding, Dressing Bathing Assistance: Limited assistance Feeding assistance: Limited assistance Dressing Assistance: Limited assistance     Functional Limitations Info             SPECIAL CARE FACTORS FREQUENCY  PT (By licensed PT), OT (By licensed OT)     PT Frequency: 5 x week OT Frequency: 5 x week            Contractures Contractures Info: Not present    Additional Factors Info  Code Status, Allergies Code Status Info: DNR Allergies Info: Gentamicin and Levofloxacin           Current Medications (08/05/2024):  This is the current hospital active medication list Current Facility-Administered Medications  Medication Dose Route Frequency Provider Last Rate Last  Admin   acetaminophen  (TYLENOL ) tablet 650 mg  650 mg Oral Q4H PRN Alexander, Natalie, DO       albuterol  (PROVENTIL ) (2.5 MG/3ML) 0.083% nebulizer solution 2.5 mg  2.5 mg Nebulization Q2H PRN Alexander, Natalie, DO       amiodarone  (PACERONE ) tablet 200 mg  200 mg Oral Daily Alexander, Natalie, DO   200 mg at 08/05/24 9071    apixaban  (ELIQUIS ) tablet 2.5 mg  2.5 mg Oral BID Hudson, Caralyn, PA-C   2.5 mg at 08/05/24 1242   ARIPiprazole  (ABILIFY ) tablet 2 mg  2 mg Oral Q breakfast Alexander, Natalie, DO   2 mg at 08/05/24 9071   bisacodyl  (DULCOLAX) suppository 10 mg  10 mg Rectal Daily PRN Alexander, Natalie, DO       dapagliflozin  propanediol (FARXIGA ) tablet 10 mg  10 mg Oral Daily Alexander, Natalie, DO   10 mg at 08/05/24 9070   feeding supplement (ENSURE PLUS HIGH PROTEIN) liquid 237 mL  237 mL Oral BID BM Alexander, Natalie, DO       furosemide  (LASIX ) tablet 20 mg  20 mg Oral Daily Hudson, Caralyn, PA-C   20 mg at 08/05/24 9070   ipratropium-albuterol  (DUONEB) 0.5-2.5 (3) MG/3ML nebulizer solution 3 mL  3 mL Nebulization BID Alexander, Natalie, DO   3 mL at 08/05/24 9273   loratadine  (CLARITIN ) tablet 10 mg  10 mg Oral Daily Alexander, Natalie, DO   10 mg at 08/05/24 9071   losartan  (COZAAR ) tablet 50 mg  50 mg Oral Daily Alexander, Natalie, DO   50 mg at 08/05/24 9070   metoprolol  succinate (TOPROL -XL) 24 hr tablet 12.5 mg  12.5 mg Oral BID Alexander, Natalie, DO   12.5 mg at 08/05/24 9071   montelukast  (SINGULAIR ) tablet 10 mg  10 mg Oral QHS Alexander, Natalie, DO   10 mg at 08/04/24 2120   ondansetron  (ZOFRAN ) tablet 4 mg  4 mg Oral PRN Alexander, Natalie, DO       polyethylene glycol (MIRALAX  / GLYCOLAX ) packet 17 g  17 g Oral Daily Marsa Edelman, DO   17 g at 08/04/24 1722   potassium chloride  (KLOR-CON  M) CR tablet 10 mEq  10 mEq Oral Daily Alexander, Natalie, DO   10 mEq at 08/05/24 9070   pravastatin  (PRAVACHOL ) tablet 20 mg  20 mg Oral q1800 Alexander, Natalie, DO   20 mg at 08/04/24 1722   predniSONE  (DELTASONE ) tablet 40 mg  40 mg Oral Q breakfast Alexander, Natalie, DO   40 mg at 08/05/24 9070   sodium phosphate (FLEET) enema 1 enema  1 enema Rectal Daily PRN Alexander, Natalie, DO         Discharge Medications: Please see discharge summary for a list of discharge medications.  Relevant  Imaging Results:  Relevant Lab Results:   Additional Information SS #: [id number]  Dalia GORMAN Fuse, RN     "

## 2024-08-05 NOTE — TOC Transition Note (Signed)
 Transition of Care [facility]) - Discharge Note   Patient Details  Name: Roslind Michaux MRN: [id number] Date of Birth: [birth year]  [facility] CM/SW Contact:  Grayce JAYSON Perfect, RN Phone Number: 08/05/2024, 12:37 PM   Clinical Narrative:    Patient discharging to skilled nursing at [facility].  [facility] will pick patient up at 2pm.   Patient aware.  No other TOC needs at this time.       Final next level of care: Skilled Nursing Facility Barriers to Discharge: No Barriers Identified   Patient Goals and CMS Choice            Discharge Placement PASRR number recieved:  (yes)            Patient chooses bed at: [facility] Patient to be transferred to facility by: [facility]   Patient and family notified of of transfer: 08/05/24  Discharge Plan and Services Additional resources added to the After Visit Summary for                                       Social Drivers of Health (SDOH) Interventions SDOH Screenings   Food Insecurity: No Food Insecurity (08/04/2024)  Housing: Low Risk (08/04/2024)  Transportation Needs: No Transportation Needs (08/04/2024)  Utilities: Not At Risk (08/04/2024)  Depression (PHQ2-9): Low Risk (07/27/2024)  Social Connections: Socially Isolated (08/04/2024)  Tobacco Use: Medium Risk (08/04/2024)     Readmission Risk Interventions     No data to display

## 2024-08-05 NOTE — Discharge Summary (Addendum)
 "    Physician Discharge Summary   Patient: Angela Mason MRN: [id number]  DOB: [birth year]   Admit:     Date of Admission: 08/04/2024 Admitted from: assisted living   Discharge: Date of discharge: 08/05/2024 Disposition: Assisted living Condition at discharge: good  CODE STATUS: DNR     Discharge Physician: Laneta Blunt, DO [facility]     PCP: Laurence Locus, DO  Recommendations for Outpatient Follow-up:  Follow up with PCP Laurence Locus, DO in 1-2 weeks Follow as directed w/ cardiology    Discharge Instructions     For home use only DME Nebulizer machine   Complete by: As directed    Patient needs a nebulizer to treat with the following condition: COPD (chronic obstructive pulmonary disease) (HCC)   Length of Need: Lifetime   Additional equipment included:  Administration kit Filter     Increase activity slowly   Complete by: As directed          Discharge Diagnoses: Principal Problem:   COPD (chronic obstructive pulmonary disease) (HCC) Active Problems:   HLD (hyperlipidemia)   Protein-calorie malnutrition, severe   HTN (hypertension)   MDD (major depressive disorder), recurrent episode, moderate (HCC)   Elevated troponin   History of atrial fibrillation      Hospital course / significant events:   Angela Mason is a 89 y.o. female  with a past medical history of paroxysmal atrial fibrillation, chronic HFmrEF, hypertension, hyperlipidemia, COPD, hypothyroidism who presented to the ED on 08/04/2024 for shortness of breath.   HPI: Patient resides at assisted living, noted some shortness of breath when she got up overnight to go to the bathroom, shortness of breath was not improving with rest so she called for help.  Reportedly EMS gave nebulizer treatment which also did not improve so she was brought into the emergency department.  Patient is also reporting some chest pain, described as a tightness, states that it is alleviated when she takes a deep  breath in.  At time of my examination, patient states she is feeling better.   01/08: In ED, initial triage vitals 94% on room air, hypertensive 182/82, reportedly was down to 92% on room air and was placed on nasal cannula.  Some increased work of breathing with respiratory rate into the 20s, BMP and BNP and troponin were pending at time of admission to observation with hospitalist.  Troponin trending up from 30-109, and concern for some T wave inversions on repeated EKG, started on heparin , cardiology was consulted --> trial dose of IV Lasix , continue home amiodarone , losartan , metoprolol , pravastatin .  Patient has stopped Eliquis  and declines resuming this.  Suspect mild troponin elevation most likely with supply/demand mismatch and not ACS, medical management and no further procedures at this time. 01/09: doing well this morning, on room air and remains comfortable. Per cardiology ok to dc heparin  and resume eliquis  which she had not been taking, same w/ lasix  which she has stopped at home also. DC on po prednisone , resume home dulera  maintenance + albuterol  prn OR can use nebulized Duoneb if preferred, rx also for lasix  and eliquis  were renewed.      Consultants:  Cardiology  Procedures/Surgeries: None      ASSESSMENT & PLAN:   COPD exacerbation Albuterol  prn OR can use nebulized Duoneb if preferred, prednisone  to finish 5-day total steroid burst Resume Dulera  maintenance   Acute on chronic HFmrEF, mild Paroxysmal atrial fibrillation Demand ischemia Suspect mild troponin most consistent with demand/supply mismatch and not ACS  in the setting of COPD exacerbation.  Resume lasix  20 mg daily   Resume Eliquis  2.5 mg bid  Continue home amiodarone  200 mg daily.  Continue losartan  50 mg daily Continue metoprolol  succinate 12.5 mg twice daily.  Continue pravastatin  20 mg daily. Continue Farxiga  10 mg daily  Anxiety/depression Continue Abilify    underweight based on BMI: Body mass  index is 18.9 kg/m.[name] Significantly low or high BMI is associated with higher medical risk.  Underweight - under 18  overweight - 25 to 29 obese - 30 or more Class 1 obesity: BMI of 30.0 to 34 Class 2 obesity: BMI of 35.0 to 39 Class 3 obesity: BMI of 40.0 to 49 Super Morbid Obesity: BMI 50-59 Super-super Morbid Obesity: BMI 60+ Healthy nutrition and physical activity advised as adjunct to other disease management and risk reduction treatments             Discharge Instructions  Allergies as of 08/05/2024       Reactions   Gentamicin    Levofloxacin Diarrhea        Medication List     STOP taking these medications    albuterol  108 (90 Base) MCG/ACT inhaler Commonly known as: VENTOLIN  HFA   estradiol  0.1 MG/GM vaginal cream Commonly known as: ESTRACE        TAKE these medications    acetaminophen  325 MG tablet Commonly known as: TYLENOL  Take 650 mg by mouth every 4 (four) hours as needed.   alum & mag hydroxide-simeth 200-200-20 MG/5 Susp Commonly known as: MAALOX/MYLANTA 2 tbsp by mouth every 4 hours as needed for gas, indigestion, or upset stomach   amiodarone  200 MG tablet Commonly known as: PACERONE  Take 200 mg by mouth daily.   ARIPiprazole  2 MG tablet Commonly known as: Abilify  Take 1 tablet (2 mg total) by mouth daily with breakfast.   bismuth subsalicylate 262 MG/15ML suspension Commonly known as: PEPTO BISMOL Take 30 mLs by mouth as needed.   carbamide peroxide 6.5 % OTIC solution Commonly known as: DEBROX 5 drops as needed.   cetirizine 5 MG tablet Commonly known as: ZYRTEC Take 5 mg by mouth as needed for allergies.   dapagliflozin  propanediol 10 MG Tabs tablet Commonly known as: FARXIGA  Take 1 tablet (10 mg total) by mouth daily.   Eliquis  2.5 MG Tabs tablet Generic drug: apixaban  Take 1 tablet (2.5 mg total) by mouth 2 (two) times daily. Refills w/ PCP or cardiology What changed:  how much to take additional instructions    FISH OIL CONCENTRATE PO Take 1 tablet by mouth daily.   furosemide  20 MG tablet Commonly known as: LASIX  Take 20 mg by mouth daily.   Glucose 15 GM/32ML Gel Take 1 packet by mouth as needed (for low blood sugar).   guaifenesin  100 MG/5ML syrup Commonly known as: ROBITUSSIN 2 tsp by mouth every 4 hours as needed for coughing   ipratropium-albuterol  0.5-2.5 (3) MG/3ML Soln Commonly known as: DUONEB Take 3 mLs by nebulization every 2 (two) hours as needed (shortness of breath).   losartan  50 MG tablet Commonly known as: COZAAR  Take 50 mg by mouth daily.   metoprolol  succinate 25 MG 24 hr tablet Commonly known as: TOPROL -XL Take 0.5 tablets (12.5 mg total) by mouth 2 (two) times daily.   Milk of Magnesia 7.75 % suspension Generic drug: magnesium  hydroxide 2 tsp by mouth as needed for constipation   mometasone -formoterol  100-5 MCG/ACT Aero Commonly known as: DULERA  Inhale 2 puffs into the lungs 2 (two) times daily.  montelukast  10 MG tablet Commonly known as: SINGULAIR  Take 10 mg by mouth at bedtime.   nystatin  powder Commonly known as: MYCOSTATIN /NYSTOP  Apply 1 Application topically 2 (two) times daily.   ondansetron  4 MG tablet Commonly known as: ZOFRAN  Take 4 mg by mouth as needed for nausea or vomiting.   OXYGEN 2 lpm for dyspnea or SOB   polyethylene glycol powder 17 GM/SCOOP powder Commonly known as: GLYCOLAX /MIRALAX  Take by mouth once. 0.5 scoop one time a day every Mon, Wed, Fri   potassium chloride  10 MEQ tablet Commonly known as: KLOR-CON  M Take 1 tablet (10 mEq total) by mouth daily. Give with furosemide    pravastatin  20 MG tablet Commonly known as: PRAVACHOL  Take 1 tablet (20 mg total) by mouth daily at 6 PM.   predniSONE  20 MG tablet Commonly known as: DELTASONE  Take 2 tablets (40 mg total) by mouth daily with breakfast for 3 days. Start taking on: August 06, 2024   PreserVision AREDS Tabs Take 1 capsule by mouth 2 (two) times daily.    Refresh 1.4-0.6 % Soln Generic drug: Polyvinyl Alcohol -Povidone PF Place 1 drop into both eyes at bedtime.               Durable Medical Equipment  (From admission, onward)           Start     Ordered   08/05/24 0000  For home use only DME Nebulizer machine       Question Answer Comment  Patient needs a nebulizer to treat with the following condition COPD (chronic obstructive pulmonary disease) (HCC)   Length of Need Lifetime   Additional equipment included Administration kit   Additional equipment included Filter      08/05/24 1254             Follow-up Information     Alluri, Keller BROCKS, MD. Go in 1 week(s).   Specialty: Cardiology Contact information: [address] [place] [address] [phone]         Laurence Locus, DO Follow up.   Specialty: Internal Medicine Why: hospital follow up Contact information: [address] [place] [address] [phone]                 Allergies[1]   Subjective: pt feeling better this morning, no SOB or chest pain, steady ambulating    Discharge Exam: BP (!) 116/57 (BP Location: Left Arm)   Pulse 75   Temp 98.5 F (36.9 C)   Resp 16   Ht 5' (1.524 m)   Wt 55.9 kg   SpO2 94%   BMI 24.07 kg/m  General: Pt is alert, awake, not in acute distress Cardiovascular: RRR, S1/S2 Respiratory: faint scattered wheezing, no rhonchi or rales  Abdominal: Soft, NT, ND, bowel sounds + Extremities: +1-2 bilateral LE edema, no cyanosis     The results of significant diagnostics from this hospitalization (including imaging, microbiology, ancillary and laboratory) are listed below for reference.     Microbiology: Recent Results (from the past 240 hours)  Resp panel by RT-PCR (RSV, Flu A&B, Covid) Anterior Nasal Swab     Status: None   Collection Time: 08/04/24  6:24 AM   Specimen: Anterior Nasal Swab  Result Value Ref Range Status   SARS Coronavirus 2 by RT PCR NEGATIVE NEGATIVE Final     Comment: (NOTE) SARS-CoV-2 target nucleic acids are NOT DETECTED.  The SARS-CoV-2 RNA is generally detectable in upper respiratory specimens during the acute phase of  infection. The lowest concentration of SARS-CoV-2 viral copies this assay can detect is 138 copies/mL. A negative result does not preclude SARS-Cov-2 infection and should not be used as the sole basis for treatment or other patient management decisions. A negative result may occur with  improper specimen collection/handling, submission of specimen other than nasopharyngeal swab, presence of viral mutation(s) within the areas targeted by this assay, and inadequate number of viral copies(<138 copies/mL). A negative result must be combined with clinical observations, patient history, and epidemiological information. The expected result is Negative.  Fact Sheet for Patients:  bloggercourse.com  Fact Sheet for Healthcare Providers:  seriousbroker.it  This test is no t yet approved or cleared by the [place]  FDA and  has been authorized for detection and/or diagnosis of SARS-CoV-2 by FDA under an Emergency Use Authorization (EUA). This EUA will remain  in effect (meaning this test can be used) for the duration of the COVID-19 declaration under Section 564(b)(1) of the Act, 21 U.S.C.section 360bbb-3(b)(1), unless the authorization is terminated  or revoked sooner.       Influenza A by PCR NEGATIVE NEGATIVE Final   Influenza B by PCR NEGATIVE NEGATIVE Final    Comment: (NOTE) The Xpert Xpress SARS-CoV-2/FLU/RSV plus assay is intended as an aid in the diagnosis of influenza from Nasopharyngeal swab specimens and should not be used as a sole basis for treatment. Nasal washings and aspirates are unacceptable for Xpert Xpress SARS-CoV-2/FLU/RSV testing.  Fact Sheet for Patients: bloggercourse.com  Fact Sheet for Healthcare  Providers: seriousbroker.it  This test is not yet approved or cleared by the [place]  FDA and has been authorized for detection and/or diagnosis of SARS-CoV-2 by FDA under an Emergency Use Authorization (EUA). This EUA will remain in effect (meaning this test can be used) for the duration of the COVID-19 declaration under Section 564(b)(1) of the Act, 21 U.S.C. section 360bbb-3(b)(1), unless the authorization is terminated or revoked.     Resp Syncytial Virus by PCR NEGATIVE NEGATIVE Final    Comment: (NOTE) Fact Sheet for Patients: bloggercourse.com  Fact Sheet for Healthcare Providers: seriousbroker.it  This test is not yet approved or cleared by the [place]  FDA and has been authorized for detection and/or diagnosis of SARS-CoV-2 by FDA under an Emergency Use Authorization (EUA). This EUA will remain in effect (meaning this test can be used) for the duration of the COVID-19 declaration under Section 564(b)(1) of the Act, 21 U.S.C. section 360bbb-3(b)(1), unless the authorization is terminated or revoked.  Performed at [facility], [address] [place], [place] [address]      Labs: BNP (last 3 results) Recent Labs    10/17/23 1500 10/28/23 0500  BNP 667.2* 1,228.0*   Basic Metabolic Panel: Recent Labs  Lab 08/04/24 0624 08/05/24 0438  NA 142 141  K 5.6* 3.8  CL 105 102  CO2 26 29  GLUCOSE 124* 126*  BUN 29* 32*  CREATININE 1.08* 0.92  CALCIUM  9.4 8.9   Liver Function Tests: No results for input(s): AST, ALT, ALKPHOS, BILITOT, PROT, ALBUMIN in the last 168 hours. No results for input(s): LIPASE, AMYLASE in the last 168 hours. No results for input(s): AMMONIA in the last 168 hours. CBC: Recent Labs  Lab 08/04/24 0624 08/05/24 0438  WBC 11.0* 13.2*  NEUTROABS 9.3*  --   HGB 12.7 11.4*  HCT 40.6 34.7*  MCV 101.8* 97.7  PLT 291 247    Cardiac Enzymes: No results for input(s): CKTOTAL, CKMB, CKMBINDEX, TROPONINI in the  last 168 hours. BNP: Invalid input(s): POCBNP CBG: No results for input(s): GLUCAP in the last 168 hours. D-Dimer No results for input(s): DDIMER in the last 72 hours. Hgb A1c No results for input(s): HGBA1C in the last 72 hours. Lipid Profile No results for input(s): CHOL, HDL, LDLCALC, TRIG, CHOLHDL, LDLDIRECT in the last 72 hours. Thyroid function studies No results for input(s): TSH, T4TOTAL, T3FREE, THYROIDAB in the last 72 hours.  Invalid input(s): FREET3 Anemia work up No results for input(s): VITAMINB12, FOLATE, FERRITIN, TIBC, IRON, RETICCTPCT in the last 72 hours. Urinalysis    Component Value Date/Time   COLORURINE YELLOW (A) 01/28/2022 1254   APPEARANCEUR Hazy (A) 02/25/2023 1316   LABSPEC 1.011 01/28/2022 1254   PHURINE 5.0 01/28/2022 1254   GLUCOSEU Negative 02/25/2023 1316   HGBUR MODERATE (A) 01/28/2022 1254   BILIRUBINUR Negative 02/25/2023 1316   KETONESUR NEGATIVE 01/28/2022 1254   PROTEINUR Negative 02/25/2023 1316   PROTEINUR NEGATIVE 01/28/2022 1254   UROBILINOGEN 0.2 09/19/2022 1544   NITRITE Negative 02/25/2023 1316   NITRITE POSITIVE (A) 01/28/2022 1254   LEUKOCYTESUR 2+ (A) 02/25/2023 1316   LEUKOCYTESUR LARGE (A) 01/28/2022 1254   Sepsis Labs Recent Labs  Lab 08/04/24 0624 08/05/24 0438  WBC 11.0* 13.2*   Microbiology Recent Results (from the past 240 hours)  Resp panel by RT-PCR (RSV, Flu A&B, Covid) Anterior Nasal Swab     Status: None   Collection Time: 08/04/24  6:24 AM   Specimen: Anterior Nasal Swab  Result Value Ref Range Status   SARS Coronavirus 2 by RT PCR NEGATIVE NEGATIVE Final    Comment: (NOTE) SARS-CoV-2 target nucleic acids are NOT DETECTED.  The SARS-CoV-2 RNA is generally detectable in upper respiratory specimens during the acute phase of infection. The lowest concentration of  SARS-CoV-2 viral copies this assay can detect is 138 copies/mL. A negative result does not preclude SARS-Cov-2 infection and should not be used as the sole basis for treatment or other patient management decisions. A negative result may occur with  improper specimen collection/handling, submission of specimen other than nasopharyngeal swab, presence of viral mutation(s) within the areas targeted by this assay, and inadequate number of viral copies(<138 copies/mL). A negative result must be combined with clinical observations, patient history, and epidemiological information. The expected result is Negative.  Fact Sheet for Patients:  bloggercourse.com  Fact Sheet for Healthcare Providers:  seriousbroker.it  This test is no t yet approved or cleared by the [place]  FDA and  has been authorized for detection and/or diagnosis of SARS-CoV-2 by FDA under an Emergency Use Authorization (EUA). This EUA will remain  in effect (meaning this test can be used) for the duration of the COVID-19 declaration under Section 564(b)(1) of the Act, 21 U.S.C.section 360bbb-3(b)(1), unless the authorization is terminated  or revoked sooner.       Influenza A by PCR NEGATIVE NEGATIVE Final   Influenza B by PCR NEGATIVE NEGATIVE Final    Comment: (NOTE) The Xpert Xpress SARS-CoV-2/FLU/RSV plus assay is intended as an aid in the diagnosis of influenza from Nasopharyngeal swab specimens and should not be used as a sole basis for treatment. Nasal washings and aspirates are unacceptable for Xpert Xpress SARS-CoV-2/FLU/RSV testing.  Fact Sheet for Patients: bloggercourse.com  Fact Sheet for Healthcare Providers: seriousbroker.it  This test is not yet approved or cleared by the [place]  FDA and has been authorized for detection and/or diagnosis of SARS-CoV-2 by FDA under an Emergency Use  Authorization (EUA). This EUA  will remain in effect (meaning this test can be used) for the duration of the COVID-19 declaration under Section 564(b)(1) of the Act, 21 U.S.C. section 360bbb-3(b)(1), unless the authorization is terminated or revoked.     Resp Syncytial Virus by PCR NEGATIVE NEGATIVE Final    Comment: (NOTE) Fact Sheet for Patients: bloggercourse.com  Fact Sheet for Healthcare Providers: seriousbroker.it  This test is not yet approved or cleared by the [place]  FDA and has been authorized for detection and/or diagnosis of SARS-CoV-2 by FDA under an Emergency Use Authorization (EUA). This EUA will remain in effect (meaning this test can be used) for the duration of the COVID-19 declaration under Section 564(b)(1) of the Act, 21 U.S.C. section 360bbb-3(b)(1), unless the authorization is terminated or revoked.  Performed at [facility], [address] [place], [place] [address]    Imaging DG Chest Portable 1 View Result Date: 08/04/2024 EXAM: 1 VIEW(S) XRAY OF THE CHEST 08/04/2024 06:27:26 AM COMPARISON: 07/13/2024 and earlier CLINICAL HISTORY: 89 year old female with shortness of breath. FINDINGS: LUNGS AND PLEURA: Coarse bilateral pulmonary interstitial opacity appears chronic and stable, and emphysema confirmed on CTA chest last year. Small chronic pleural effusions, also on prior CTA, do not appear significantly changed. [name] No pneumothorax. HEART AND MEDIASTINUM: Tortuous aorta with aortic atherosclerosis. BONES AND SOFT TISSUES: Osteopenia. Thoracic spondylosis. IMPRESSION: 1. Emphysema. Small chronic pleural effusions not significantly changed from last year. 2. No new cardiopulmonary abnormality. Electronically signed by: Helayne Hurst MD MD 08/04/2024 06:45 AM EST RP Workstation: [id number]      Time coordinating discharge: over 30 minutes  SIGNED:  Richardo Popoff DO [facility]        [1]  Allergies Allergen Reactions   Gentamicin    Levofloxacin Diarrhea   "

## 2024-08-05 NOTE — Care Management Obs Status (Signed)
 MEDICARE OBSERVATION STATUS NOTIFICATION   Patient Details  Name: Mailani Degroote MRN: [id number] Date of Birth: [birth year]   Medicare Observation Status Notification Given:  No   Patient discharged  Angela Mason 08/05/2024, 3:08 PM

## 2024-08-08 ENCOUNTER — Encounter: Payer: Self-pay | Admitting: Orthopedic Surgery

## 2024-08-08 ENCOUNTER — Non-Acute Institutional Stay (SKILLED_NURSING_FACILITY): Payer: Self-pay | Admitting: Orthopedic Surgery

## 2024-08-08 DIAGNOSIS — I2489 Other forms of acute ischemic heart disease: Secondary | ICD-10-CM | POA: Diagnosis not present

## 2024-08-08 DIAGNOSIS — I480 Paroxysmal atrial fibrillation: Secondary | ICD-10-CM

## 2024-08-08 DIAGNOSIS — I5033 Acute on chronic diastolic (congestive) heart failure: Secondary | ICD-10-CM

## 2024-08-08 DIAGNOSIS — J441 Chronic obstructive pulmonary disease with (acute) exacerbation: Secondary | ICD-10-CM | POA: Diagnosis not present

## 2024-08-08 DIAGNOSIS — F331 Major depressive disorder, recurrent, moderate: Secondary | ICD-10-CM

## 2024-08-08 NOTE — Progress Notes (Signed)
 " Location:  Other [facility].  Nursing Home Room Number: [facility] [id number] Place of Service:  SNF [phone]) Provider:  Greig Cluster, NP  PCP: Laurence Locus, DO  Patient Care Team: Laurence Locus, DO as PCP - General (Internal Medicine)  Extended Emergency Contact Information Primary Emergency Contact: Cumbee,Carolyn Address: [address] (neighbor - 100 yards away)          [place], [place] [address] [place] Phone: [phone] Relation: Friend Secondary Emergency Contact: Obey,[name] Address: Note:  my last name is OBEY, not [name]  [place] Home Phone: [phone] Relation: Daughter  Code Status:  Full Code.  Goals of care: Advanced Directive information    08/04/2024    6:18 AM  Advanced Directives  Does Patient Have a Medical Advance Directive? Yes  Type of Advance Directive Out of facility DNR (pink MOST or yellow form)  Does patient want to make changes to medical advance directive? No - Patient declined     Chief Complaint  Patient presents with   Hospitalization Follow-up    Hospital Follow up    HPI:  Pt is a 89 y.o. female seen today for f/u s/p hospitalization 01/08-01/09 due to COPD exacerbation.   She currently resides on the assisted living unit at [facility]. PMH: atrial fib, HTN, HLD, COPD, cystocele/rectocele, hypothyroidism, hyponatremia, insomnia and macular degeneration.   01/08 she presented to the ED due to shortness of breath. RR was elevated. She did require oxygen for a brief period of time. Troponin's trended up to 30-109, EKG showed T wave inversions. Cardiology was consulted. Mild troponin elevation thought to be related to supply/demand mismatch and not ACS. She was placed on heparin  drip for a brief period. She was not taking Eliquis  prior to hospitalization. CXR noted emphysema and small chronic pleural effusions> no change from last year. She was given duonebs and previously prescribed  prednisone  taper. 12/17 she had increased shortness of breath and went to the ED for evaluation. CXR negative for infiltrates, decreased right pleural effusion noted. Respiratory panel negative. She was diagnosed with COPD exacerbation. She was given duoneb treatment and discharged with prednisone . 12/31 furosemide  was started daily due to increased pitting edema. Apparently she told ED she was not taking furosemide  eithdeper. She was advised to resume Eliquis  and furosemide . Due to weakness she was admitted to rehab unit at [facility] for PT/OT/RN services.   Today, she reports improved breathing. She is not using oxygen. Admits to weakness and mild shortness of breath with distance walking. Lower leg edema has also worsened. She admits not wearing compression stockings for a few days now. Requesting to restart them. No recent falls or injuries. Appetite fair. Afebrile. Vitals stable.     Past Medical History:  Diagnosis Date   Cancer (HCC)    SKIN   COPD, mild (HCC)    Dyspnea    Edema    Heart failure with mildly reduced ejection fraction (HFmrEF) (HCC)    HLD (hyperlipidemia)    HTN (hypertension)    Hypothyroidism    Macular degeneration, bilateral    Paroxysmal atrial fibrillation (HCC)    Varicosities    Past Surgical History:  Procedure Laterality Date   CATARACT EXTRACTION W/PHACO Right 02/03/2017   Procedure: CATARACT EXTRACTION PHACO AND INTRAOCULAR LENS PLACEMENT (IOC);  Surgeon: Jaye Fallow, MD;  Location: [facility] ORS;  Service: Ophthalmology;  Laterality: Right;  US  00:57.2 AP% 18.5 CDE 10.58 Fluid pack lot #  [id number]   CATARACT EXTRACTION W/PHACO Left 02/24/2017   Procedure: CATARACT EXTRACTION PHACO AND INTRAOCULAR LENS PLACEMENT (IOC);  Surgeon: Jaye Fallow, MD;  Location: [facility] ORS;  Service: Ophthalmology;  Laterality: Left;  US  01:12 AP% 25.0 CDE 18.03 Fluid pack lot # [id number]   MOHS SURGERY  2015   nose   SALIVARY GLAND SURGERY Left 2012    TONSILLECTOMY  1940    Allergies[1]  Outpatient Encounter Medications as of 08/08/2024  Medication Sig   acetaminophen  (TYLENOL ) 325 MG tablet Take 650 mg by mouth every 4 (four) hours as needed.   albuterol  (VENTOLIN  HFA) 108 (90 Base) MCG/ACT inhaler Inhale 2 puffs into the lungs every 4 (four) hours as needed for wheezing or shortness of breath.   amiodarone  (PACERONE ) 200 MG tablet Take 200 mg by mouth daily.   apixaban  (ELIQUIS ) 2.5 MG TABS tablet Take 1 tablet (2.5 mg total) by mouth 2 (two) times daily. Refills w/ PCP or cardiology   ARIPiprazole  (ABILIFY ) 2 MG tablet Take 1 tablet (2 mg total) by mouth daily with breakfast.   cetirizine (ZYRTEC) 5 MG tablet Take 5 mg by mouth as needed for allergies.   dapagliflozin  propanediol (FARXIGA ) 10 MG TABS tablet Take 1 tablet (10 mg total) by mouth daily.   furosemide  (LASIX ) 20 MG tablet Take 20 mg by mouth daily.   Glucose 15 GM/32ML GEL Take 1 packet by mouth as needed (for low blood sugar).   losartan  (COZAAR ) 50 MG tablet Take 50 mg by mouth daily.   metoprolol  succinate (TOPROL -XL) 25 MG 24 hr tablet Take 0.5 tablets (12.5 mg total) by mouth 2 (two) times daily.   mometasone -formoterol  (DULERA ) 100-5 MCG/ACT AERO Inhale 2 puffs into the lungs 2 (two) times daily.   montelukast  (SINGULAIR ) 10 MG tablet Take 10 mg by mouth at bedtime.   Multiple Vitamins-Minerals (PRESERVISION AREDS) TABS Take 1 capsule by mouth 2 (two) times daily.   nystatin  (MYCOSTATIN /NYSTOP ) powder Apply 1 Application topically 2 (two) times daily.   Omega-3 Fatty Acids (FISH OIL CONCENTRATE PO) Take 1 tablet by mouth daily.    ondansetron  (ZOFRAN ) 4 MG tablet Take 4 mg by mouth as needed for nausea or vomiting.   OXYGEN 2 lpm for dyspnea or SOB   polyethylene glycol powder (GLYCOLAX /MIRALAX ) 17 GM/SCOOP powder Take by mouth once. 0.5 scoop one time a day every Mon, Wed, Fri   Polyvinyl Alcohol -Povidone PF (REFRESH) 1.4-0.6 % SOLN Place 1 drop into both eyes at  bedtime.   potassium chloride  (KLOR-CON  M) 10 MEQ tablet Take 1 tablet (10 mEq total) by mouth daily. Give with furosemide    pravastatin  (PRAVACHOL ) 20 MG tablet Take 1 tablet (20 mg total) by mouth daily at 6 PM.   predniSONE  (DELTASONE ) 20 MG tablet Take 2 tablets (40 mg total) by mouth daily with breakfast for 3 days.   alum & mag hydroxide-simeth (MAALOX/MYLANTA) 200-200-20 MG/5 SUSP 2 tbsp by mouth every 4 hours as needed for gas, indigestion, or upset stomach (Patient not taking: Reported on 08/08/2024)   bismuth subsalicylate (PEPTO BISMOL) 262 MG/15ML suspension Take 30 mLs by mouth as needed. (Patient not taking: Reported on 08/08/2024)   carbamide peroxide (DEBROX) 6.5 % OTIC solution 5 drops as needed. (Patient not taking: Reported on 08/08/2024)   guaifenesin  (ROBITUSSIN) 100 MG/5ML syrup 2 tsp by mouth every 4 hours as needed for coughing (Patient not taking: Reported on 08/08/2024)   ipratropium-albuterol  (DUONEB) 0.5-2.5 (3) MG/3ML SOLN Take 3 mLs by nebulization every 2 (two)  hours as needed (shortness of breath). (Patient not taking: Reported on 08/08/2024)   magnesium  hydroxide (MILK OF MAGNESIA) 400 MG/5ML suspension 2 tsp by mouth as needed for constipation (Patient not taking: Reported on 08/08/2024)   No facility-administered encounter medications on file as of 08/08/2024.    Review of Systems  Constitutional:  Negative for fatigue and fever.  HENT:  Negative for sore throat and trouble swallowing.   Respiratory:  Positive for cough, shortness of breath and wheezing.   Cardiovascular:  Positive for leg swelling. Negative for chest pain.  Gastrointestinal:  Negative for abdominal distention, abdominal pain, constipation and diarrhea.  Genitourinary:  Negative for dysuria and frequency.  Musculoskeletal:  Positive for arthralgias and gait problem.  Skin:  Negative for wound.  Neurological:  Positive for weakness. Negative for dizziness and headaches.  Psychiatric/Behavioral:   Positive for dysphoric mood. Negative for confusion and sleep disturbance. The patient is not nervous/anxious.     Immunization History  Administered Date(s) Administered   INFLUENZA, HIGH DOSE SEASONAL PF 05/21/2017, 05/07/2022   Influenza-Unspecified 05/09/2021, 05/15/2023   PFIZER SARS-COV-2 Pediatric Vaccination 5-67yrs 08/09/2019, 09/06/2019, 06/12/2020   PNEUMOCOCCAL CONJUGATE-20 02/11/2022   PPD Test 10/16/2020, 10/26/2020, 02/01/2022, 02/10/2022   Pneumococcal Conjugate-13 08/08/2018   Zoster Recombinant(Shingrix) 02/05/2023, 06/10/2023   Pertinent  Health Maintenance Due  Topic Date Due   Bone Density Scan  Never done   Influenza Vaccine  02/26/2024      11/14/2022    1:17 PM 01/14/2023    1:48 PM 01/30/2023    3:20 PM 03/03/2023    3:50 PM 07/27/2024   10:54 AM  Fall Risk  Falls in the past year? 0 0 0 0 1  Was there an injury with Fall? 0  0  0  0  0  Fall Risk Category Calculator 0 0 0 0 1  Patient at Risk for Falls Due to   No Fall Risks Impaired balance/gait Impaired balance/gait  Fall risk Follow up   Falls evaluation completed Falls evaluation completed Falls evaluation completed     Data saved with a previous flowsheet row definition   Functional Status Survey:    Vitals:   08/08/24 1100  BP: (!) 109/57  Pulse: 69  Resp: 16  Temp: (!) 97.4 F (36.3 C)  SpO2: 97%  Weight: 98 lb 3.2 oz (44.5 kg)  Height: 5' (1.524 m)   Body mass index is 19.18 kg/m. Physical Exam Vitals reviewed.  Constitutional:      General: She is not in acute distress. HENT:     Head: Normocephalic.     Right Ear: Tympanic membrane normal.     Left Ear: Tympanic membrane normal.     Nose: Nose normal.     Mouth/Throat:     Mouth: Mucous membranes are moist.  Eyes:     General:        Right eye: No discharge.        Left eye: No discharge.  Cardiovascular:     Rate and Rhythm: Normal rate. Rhythm irregular.     Pulses: Normal pulses.     Heart sounds: Normal heart  sounds.  Pulmonary:     Effort: Pulmonary effort is normal. No respiratory distress.     Breath sounds: Examination of the right-middle field reveals rales. Examination of the left-middle field reveals rales. Rhonchi and rales present. No wheezing.     Comments: Faint crackles to middle lobes Abdominal:     General: Bowel sounds are normal.  Palpations: Abdomen is soft.  Musculoskeletal:     Cervical back: Neck supple.     Right lower leg: Edema present.     Left lower leg: Edema present.     Comments: 1-2+ pitting  Skin:    General: Skin is warm.     Capillary Refill: Capillary refill takes less than 2 seconds.  Neurological:     General: No focal deficit present.     Mental Status: She is alert and oriented to person, place, and time.     Gait: Gait abnormal.  Psychiatric:        Mood and Affect: Mood normal. Affect is flat.     Labs reviewed: Recent Labs    10/17/23 1638 10/17/23 1930 10/31/23 0606 03/03/24 0000 07/13/24 0925 08/04/24 0624 08/05/24 0438  NA  --    < > 139   < > 139 142 141  K  --    < > 4.3   < > 4.2 5.6* 3.8  CL  --    < > 99   < > 99 105 102  CO2  --    < > 34*   < > 31 26 29   GLUCOSE  --    < > 94  --  112* 124* 126*  BUN  --    < > 37*   < > 22 29* 32*  CREATININE  --    < > 0.83   < > 1.00 1.08* 0.92  CALCIUM   --    < > 8.5*   < > 9.7 9.4 8.9  MG 2.3  --  2.8*  --   --   --   --   PHOS 3.2  --   --   --   --   --   --    < > = values in this interval not displayed.   Recent Labs    10/17/23 1502  AST 30  ALT 39  ALKPHOS 70  BILITOT 0.9  PROT 6.4*  ALBUMIN 3.2*   Recent Labs    10/26/23 0000 10/28/23 0500 10/29/23 0321 07/13/24 0925 08/04/24 0624 08/05/24 0438  WBC 8.0 13.3*   < > 10.7* 11.0* 13.2*  NEUTROABS 6,304.00 10.0*  --   --  9.3*  --   HGB 11.0* 13.0   < > 13.1 12.7 11.4*  HCT 35* 42.6   < > 40.6 40.6 34.7*  MCV  --  105.7*   < > 99.8 101.8* 97.7  PLT 260 298   < > 203 291 247   < > = values in this interval  not displayed.   Lab Results  Component Value Date   TSH 0.968 10/17/2023   Lab Results  Component Value Date   HGBA1C 5.6 11/03/2022   Lab Results  Component Value Date   CHOL 195 07/02/2023   HDL 68 07/02/2023   LDLCALC 108 07/02/2023   TRIG 101 11/03/2022    Significant Diagnostic Results in last 30 days:  DG Chest Portable 1 View Result Date: 08/04/2024 EXAM: 1 VIEW(S) XRAY OF THE CHEST 08/04/2024 06:27:26 AM COMPARISON: 07/13/2024 and earlier CLINICAL HISTORY: 89 year old female with shortness of breath. FINDINGS: LUNGS AND PLEURA: Coarse bilateral pulmonary interstitial opacity appears chronic and stable, and emphysema confirmed on CTA chest last year. Small chronic pleural effusions, also on prior CTA, do not appear significantly changed. [name] No pneumothorax. HEART AND MEDIASTINUM: Tortuous aorta with aortic atherosclerosis. BONES AND SOFT TISSUES: Osteopenia. Thoracic spondylosis. IMPRESSION:  1. Emphysema. Small chronic pleural effusions not significantly changed from last year. 2. No new cardiopulmonary abnormality. Electronically signed by: Helayne Hurst MD MD 08/04/2024 06:45 AM EST RP Workstation: [id number]   DG Chest 2 View Result Date: 07/13/2024 EXAM: 2 VIEW(S) XRAY OF THE CHEST 07/13/2024 09:58:03 AM COMPARISON: 12/03/2023 CLINICAL HISTORY: sob FINDINGS: LUNGS AND PLEURA: Decreased right pleural effusion from prior. No focal pulmonary opacity. No pneumothorax. HEART AND MEDIASTINUM: Aortic atherosclerosis. No acute abnormality of the cardiac and mediastinal silhouettes. BONES AND SOFT TISSUES: Thoracic spondylosis. IMPRESSION: 1. Decreased right pleural effusion from prior. Electronically signed by: Evalene Coho MD 07/13/2024 10:09 AM EST RP Workstation: [id number]    Assessment/Plan 1. COPD with acute exacerbation (HCC) (Primary) - hospitalized 01/08-01/09 - no wheezing on exam today,  - off oxygen - cont albuterol , duonebs, Dulera   2. Acute on chronic diastolic  congestive heart failure (HCC) - EF 35-40% - BNP 1374> stopped taking furosemide   - CXR noted chronic effusions> no change from last year - 1-2+ pitting edema - start compression stockings qam  - cont furosemide /KCL - will make daily weight   3. Paroxysmal atrial fibrillation (HCC) - HR< 100 with amiodarone  - cont low dose Eliquis  for clot prevention   4. Demand ischemia (HCC) - elevated troponin 30-109 - cardiology thought due to supply/demand mismatch and not ACS  5. MDD (major depressive disorder), recurrent episode, moderate (HCC) - flat affect  - cont Abilify      Family/ staff Communication: plan discussed with patient and nurse  Labs/tests ordered:  daily weights, compression stockings qam        [1]  Allergies Allergen Reactions   Gentamicin    Levofloxacin Diarrhea   "

## 2024-08-09 ENCOUNTER — Non-Acute Institutional Stay (SKILLED_NURSING_FACILITY): Payer: Self-pay | Admitting: Internal Medicine

## 2024-08-09 ENCOUNTER — Encounter: Payer: Self-pay | Admitting: Internal Medicine

## 2024-08-09 DIAGNOSIS — F331 Major depressive disorder, recurrent, moderate: Secondary | ICD-10-CM | POA: Diagnosis not present

## 2024-08-09 DIAGNOSIS — E782 Mixed hyperlipidemia: Secondary | ICD-10-CM

## 2024-08-09 DIAGNOSIS — I10 Essential (primary) hypertension: Secondary | ICD-10-CM | POA: Diagnosis not present

## 2024-08-09 DIAGNOSIS — I5022 Chronic systolic (congestive) heart failure: Secondary | ICD-10-CM | POA: Diagnosis not present

## 2024-08-09 DIAGNOSIS — J449 Chronic obstructive pulmonary disease, unspecified: Secondary | ICD-10-CM | POA: Diagnosis not present

## 2024-08-09 DIAGNOSIS — E039 Hypothyroidism, unspecified: Secondary | ICD-10-CM | POA: Diagnosis not present

## 2024-08-09 DIAGNOSIS — I480 Paroxysmal atrial fibrillation: Secondary | ICD-10-CM

## 2024-08-09 NOTE — Assessment & Plan Note (Signed)
 She not taking any thyroid replacement hormones at this time.

## 2024-08-09 NOTE — Assessment & Plan Note (Addendum)
 She remains on Lasix  20 mg daily, losartan  50 mg daily, Toprol -XL 12.5 mg twice daily, potassium 10 mEq once daily, Farxiga  10 mg daily she does not have a known history of diabetes..  Last echo performed in April 2025 which showed an LVEF of 35 to 40%.  She sees [facility] cardiology on a regular basis.

## 2024-08-09 NOTE — Assessment & Plan Note (Signed)
 Stable.  She remains on Abilify  2 mg daily

## 2024-08-09 NOTE — Progress Notes (Signed)
 [facility] Admission H&P  Provider: Camellia Door, DO Location:  Other [facility]) Nursing Home Room Number: 108 A Place of Service:  SNF (31)   PCP: Door Camellia, DO Patient Care Team: Door Camellia, DO as PCP - General (Internal Medicine)   Extended Emergency Contact Information Primary Emergency Contact: Cumbee,Carolyn Address: [address] (neighbor - 100 yards away)          [place], [place] [address] [place] Phone: [phone] Relation: Friend Secondary Emergency Contact: Obey,[name] Address: Note:  my last name is OBEY, not [name]  [place] Home Phone: [phone] Relation: Daughter   Goals of Care: Advanced Directive information    08/04/2024    6:18 AM  Advanced Directives  Does Patient Have a Medical Advance Directive? Yes  Type of Advance Directive Out of facility DNR (pink MOST or yellow form)  Does patient want to make changes to medical advance directive? No - Patient declined    CODE STATUS: Full Code    Chief Complaint  Patient presents with   New Admit To SNF    New admission to [facility]      HPI: Patient is a 89 y.o. female seen today for admission to  89 yo WF who is a resident over at [facility] assisted living who was admitted to the hospital on August 04, 2024 for a COPD exacerbation.  She was discharged the next day on August 05, 2024 to [facility] for rehab.  During her hospitalization, she had a COPD exacerbation, acute on chronic diastolic heart failure and some demand ischemia.  She was given a dose of Lasix  in the ER.  She was discharged back to SNF on prednisone  40 mg daily for 3 days.  Her Eliquis  was resumed by cardiology.  She was discharged on 20 mg of daily Lasix .  She is seen up and walking in the hallways without any problems.  She is hopeful she can return back to assisted living soon.  She has no other medical complaints.  Her only issue is that she states  that the bed in her assisted living apartment is quite high for her to climb into.  She really has a handrail that she uses to help herself get into bed.  She states that she needs to sit down on her mattress but then has to swing her legs up quite high in order for her legs to get onto the mattress.  She does not have any trouble getting down from her mattress.  She states that having to get up to use the restroom in the middle night is quite difficult for her because of trying to get back into bed.  This is a comprehensive admission note to this SNF performed on this date less than 30 days from date of admission. Included are preadmission medical/surgical history; reconciled medication list; family history; social history and comprehensive review of systems.  Corrections and additions to the records were documented. Comprehensive physical exam was also performed. Additionally a clinical summary was entered for each active diagnosis pertinent to this admission in the Problem List to enhance continuity of care.   Past Medical History:  Diagnosis Date   Cancer [facility])    SKIN   Closed fracture of superior ramus of left pubis (HCC) 01/27/2022   COPD, mild (HCC)    Dyspnea    Edema    Heart failure with mildly reduced ejection fraction (  HFmrEF) (HCC)    History of delirium 03/05/2021   History of falling 02/01/2022   HLD (hyperlipidemia)    HTN (hypertension)    Hypothyroidism    Macular degeneration, bilateral    Paroxysmal atrial fibrillation (HCC)    Severe major depression, single episode, with psychotic features (HCC) 10/12/2020   Varicosities    Past Surgical History:  Procedure Laterality Date   CATARACT EXTRACTION W/PHACO Right 02/03/2017   Procedure: CATARACT EXTRACTION PHACO AND INTRAOCULAR LENS PLACEMENT (IOC);  Surgeon: Jaye Fallow, MD;  Location: [facility] ORS;  Service: Ophthalmology;  Laterality: Right;  US  00:57.2 AP% 18.5 CDE 10.58 Fluid pack lot # [id number]   CATARACT  EXTRACTION W/PHACO Left 02/24/2017   Procedure: CATARACT EXTRACTION PHACO AND INTRAOCULAR LENS PLACEMENT (IOC);  Surgeon: Jaye Fallow, MD;  Location: [facility] ORS;  Service: Ophthalmology;  Laterality: Left;  US  01:12 AP% 25.0 CDE 18.03 Fluid pack lot # [id number]   MOHS SURGERY  2015   nose   SALIVARY GLAND SURGERY Left 2012   TONSILLECTOMY  1940    reports that she quit smoking about 18 years ago. Her smoking use included cigarettes. She has never used smokeless tobacco. She reports that she does not currently use alcohol . She reports that she does not use drugs. Social History   Socioeconomic History   Marital status: Widowed    Spouse name: Not on file   Number of children: Not on file   Years of education: Not on file   Highest education level: Not on file  Occupational History   Not on file  Tobacco Use   Smoking status: Former    Current packs/day: 0.00    Types: Cigarettes    Quit date: 09/05/2005    Years since quitting: 18.9   Smokeless tobacco: Never  Vaping Use   Vaping status: Never Used  Substance and Sexual Activity   Alcohol  use: Not Currently   Drug use: No   Sexual activity: Not Currently    Birth control/protection: Post-menopausal  Other Topics Concern   Not on file  Social History Narrative   Not on file   Social Drivers of Health   Tobacco Use: Medium Risk (08/09/2024)   Patient History    Smoking Tobacco Use: Former    Smokeless Tobacco Use: Never    Passive Exposure: Not on Actuary Strain: Not on file  Food Insecurity: No Food Insecurity (08/04/2024)   Epic    Worried About Programme Researcher, Broadcasting/film/video in the Last Year: Never true    Ran Out of Food in the Last Year: Never true  Transportation Needs: No Transportation Needs (08/04/2024)   Epic    Lack of Transportation (Medical): No    Lack of Transportation (Non-Medical): No  Physical Activity: Not on file  Stress: Not on file  Social Connections: Socially Isolated (08/04/2024)    Social Connection and Isolation Panel    Frequency of Communication with Friends and Family: More than three times a week    Frequency of Social Gatherings with Friends and Family: Twice a week    Attends Religious Services: Never    Database Administrator or Organizations: No    Attends Banker Meetings: Never    Marital Status: Widowed  Intimate Partner Violence: Not At Risk (08/04/2024)   Epic    Fear of Current or Ex-Partner: No    Emotionally Abused: No    Physically Abused: No    Sexually Abused: No  Depression (PHQ2-9):  Low Risk (08/08/2024)   Depression (PHQ2-9)    PHQ-2 Score: 0  Alcohol  Screen: Not on file  Housing: Low Risk (08/04/2024)   Epic    Unable to Pay for Housing in the Last Year: No    Number of Times Moved in the Last Year: 0    Homeless in the Last Year: No  Utilities: Not At Risk (08/04/2024)   Epic    Threatened with loss of utilities: No  Health Literacy: Not on file     Functional Status Survey:     Family History  Problem Relation Age of Onset   Diverticulitis Mother    Macular degeneration Mother    COPD Father    Heart failure Father    Epilepsy Sister    Parkinson's disease Brother    Cancer Neg Hx    Diabetes Neg Hx    Breast cancer Neg Hx      Health Maintenance  Topic Date Due   Bone Density Scan  Never done   COVID-19 Vaccine (1) 07/10/2020   Influenza Vaccine  02/26/2024   Medicare Annual Wellness (AWV)  07/08/2024   DTaP/Tdap/Td (1 - Tdap) 05/26/2025 (Originally 10/21/1951)   Pneumococcal Vaccine: 50+ Years  Completed   Zoster Vaccines- Shingrix  Completed   Meningococcal B Vaccine  Aged Out     Allergies[1]   Outpatient Encounter Medications as of 08/09/2024  Medication Sig   acetaminophen  (TYLENOL ) 325 MG tablet Take 650 mg by mouth every 4 (four) hours as needed.   albuterol  (VENTOLIN  HFA) 108 (90 Base) MCG/ACT inhaler Inhale 2 puffs into the lungs every 4 (four) hours as needed for wheezing or shortness of  breath.   amiodarone  (PACERONE ) 200 MG tablet Take 200 mg by mouth daily.   apixaban  (ELIQUIS ) 2.5 MG TABS tablet Take 1 tablet (2.5 mg total) by mouth 2 (two) times daily. Refills w/ PCP or cardiology   ARIPiprazole  (ABILIFY ) 2 MG tablet Take 1 tablet (2 mg total) by mouth daily with breakfast.   cetirizine (ZYRTEC) 5 MG tablet Take 5 mg by mouth as needed for allergies.   dapagliflozin  propanediol (FARXIGA ) 10 MG TABS tablet Take 1 tablet (10 mg total) by mouth daily.   furosemide  (LASIX ) 20 MG tablet Take 20 mg by mouth daily.   Glucose 15 GM/32ML GEL Take 1 packet by mouth as needed (for low blood sugar).   losartan  (COZAAR ) 50 MG tablet Take 50 mg by mouth daily.   metoprolol  succinate (TOPROL -XL) 25 MG 24 hr tablet Take 0.5 tablets (12.5 mg total) by mouth 2 (two) times daily.   mometasone -formoterol  (DULERA ) 100-5 MCG/ACT AERO Inhale 2 puffs into the lungs 2 (two) times daily.   montelukast  (SINGULAIR ) 10 MG tablet Take 10 mg by mouth at bedtime.   Multiple Vitamins-Minerals (PRESERVISION AREDS) TABS Take 1 capsule by mouth 2 (two) times daily.   nystatin  (MYCOSTATIN /NYSTOP ) powder Apply 1 Application topically 2 (two) times daily.   Omega-3 Fatty Acids (FISH OIL CONCENTRATE PO) Take 1 tablet by mouth daily.    ondansetron  (ZOFRAN ) 4 MG tablet Take 4 mg by mouth as needed for nausea or vomiting.   OXYGEN 2 lpm for dyspnea or SOB   polyethylene glycol powder (GLYCOLAX /MIRALAX ) 17 GM/SCOOP powder Take by mouth once. 0.5 scoop one time a day every Mon, Wed, Fri   Polyvinyl Alcohol -Povidone PF (REFRESH) 1.4-0.6 % SOLN Place 1 drop into both eyes at bedtime.   potassium chloride  (KLOR-CON  M) 10 MEQ tablet Take 1 tablet (10  mEq total) by mouth daily. Give with furosemide    pravastatin  (PRAVACHOL ) 20 MG tablet Take 1 tablet (20 mg total) by mouth daily at 6 PM.   predniSONE  (DELTASONE ) 20 MG tablet Take 2 tablets (40 mg total) by mouth daily with breakfast for 3 days.   alum & mag  hydroxide-simeth (MAALOX/MYLANTA) 200-200-20 MG/5 SUSP 2 tbsp by mouth every 4 hours as needed for gas, indigestion, or upset stomach (Patient not taking: Reported on 08/09/2024)   ipratropium-albuterol  (DUONEB) 0.5-2.5 (3) MG/3ML SOLN Take 3 mLs by nebulization every 2 (two) hours as needed (shortness of breath). (Patient not taking: Reported on 08/09/2024)   No facility-administered encounter medications on file as of 08/09/2024.     Review of Systems  Constitutional: Negative.   HENT: Negative.    Eyes: Negative.   Respiratory: Negative.    Cardiovascular: Negative.   Gastrointestinal: Negative.   Endocrine: Negative.   Genitourinary: Negative.   Allergic/Immunologic: Negative.   Neurological: Negative.   Hematological: Negative.   Psychiatric/Behavioral: Negative.    All other systems reviewed and are negative.    Vitals:   08/09/24 0909 08/09/24 0913  BP: (S) (!) 150/76 (!) 154/74  Pulse: 67   Temp: (!) 97.3 F (36.3 C)   SpO2: 90%   Weight: 95 lb 9.6 oz (43.4 kg)   Height: 5' (1.524 m)    Body mass index is 18.67 kg/m. Physical Exam Vitals reviewed.  Constitutional:      General: She is not in acute distress.    Appearance: She is not toxic-appearing or diaphoretic.  HENT:     Head: Normocephalic and atraumatic.  Cardiovascular:     Rate and Rhythm: Normal rate and regular rhythm.  Pulmonary:     Effort: Pulmonary effort is normal. No respiratory distress.     Breath sounds: No wheezing.  Abdominal:     General: Abdomen is flat. Bowel sounds are normal.     Palpations: Abdomen is soft.  Musculoskeletal:     Right lower leg: No edema.     Left lower leg: No edema.  Skin:    General: Skin is warm and dry.     Capillary Refill: Capillary refill takes less than 2 seconds.  Neurological:     Mental Status: She is alert and oriented to person, place, and time.      Labs reviewed: Basic Metabolic Panel: Recent Labs    10/17/23 1638 10/17/23 1930  10/31/23 0606 03/03/24 0000 07/13/24 0925 08/04/24 0624 08/05/24 0438  NA  --    < > 139   < > 139 142 141  K  --    < > 4.3   < > 4.2 5.6* 3.8  CL  --    < > 99   < > 99 105 102  CO2  --    < > 34*   < > 31 26 29   GLUCOSE  --    < > 94  --  112* 124* 126*  BUN  --    < > 37*   < > 22 29* 32*  CREATININE  --    < > 0.83   < > 1.00 1.08* 0.92  CALCIUM   --    < > 8.5*   < > 9.7 9.4 8.9  MG 2.3  --  2.8*  --   --   --   --   PHOS 3.2  --   --   --   --   --   --    < > =  values in this interval not displayed.   Liver Function Tests: Recent Labs    10/17/23 1502  AST 30  ALT 39  ALKPHOS 70  BILITOT 0.9  PROT 6.4*  ALBUMIN 3.2*    CBC: Recent Labs    10/26/23 0000 10/28/23 0500 10/29/23 0321 07/13/24 0925 08/04/24 0624 08/05/24 0438  WBC 8.0 13.3*   < > 10.7* 11.0* 13.2*  NEUTROABS 6,304.00 10.0*  --   --  9.3*  --   HGB 11.0* 13.0   < > 13.1 12.7 11.4*  HCT 35* 42.6   < > 40.6 40.6 34.7*  MCV  --  105.7*   < > 99.8 101.8* 97.7  PLT 260 298   < > 203 291 247   < > = values in this interval not displayed.    Lab Results  Component Value Date   HGBA1C 5.6 11/03/2022   Lab Results  Component Value Date   TSH 0.968 10/17/2023   Lab Results  Component Value Date   VITAMINB12 1,225 (H) 10/10/2020     Imaging and Procedures obtained prior to SNF admission: DG Chest Portable 1 View Result Date: 08/04/2024 EXAM: 1 VIEW(S) XRAY OF THE CHEST 08/04/2024 06:27:26 AM COMPARISON: 07/13/2024 and earlier CLINICAL HISTORY: 89 year old female with shortness of breath. FINDINGS: LUNGS AND PLEURA: Coarse bilateral pulmonary interstitial opacity appears chronic and stable, and emphysema confirmed on CTA chest last year. Small chronic pleural effusions, also on prior CTA, do not appear significantly changed. [name] No pneumothorax. HEART AND MEDIASTINUM: Tortuous aorta with aortic atherosclerosis. BONES AND SOFT TISSUES: Osteopenia. Thoracic spondylosis. IMPRESSION: 1. Emphysema. Small  chronic pleural effusions not significantly changed from last year. 2. No new cardiopulmonary abnormality. Electronically signed by: Helayne Hurst MD MD 08/04/2024 06:45 AM EST RP Workstation: [id number]     Assessment/Plan COPD (chronic obstructive pulmonary disease) (HCC) Patient has completed her 40 mg of daily prednisone .  She remains on as needed albuterol  inhaler, Dulera  100/5 2 puffs twice daily, Singulair  10 mg nightly.  She is not wheezing any longer.  COPD exacerbation has resolved.  Essential hypertension She remains on Lasix  20 mg daily, losartan  50 mg daily, Toprol -XL 12.5 mg twice daily  Chronic systolic CHF (congestive heart failure) (HCC) She remains on Lasix  20 mg daily, losartan  50 mg daily, Toprol -XL 12.5 mg twice daily, potassium 10 mEq once daily, Farxiga  10 mg daily she does not have a known history of diabetes..  Last echo performed in April 2025 which showed an LVEF of 35 to 40%.  She sees [facility] cardiology on a regular basis.  Mixed hyperlipidemia Continue with pravastatin  20 mg daily.  Acquired hypothyroidism She not taking any thyroid replacement hormones at this time.  MDD (major depressive disorder), recurrent episode, moderate (HCC) Stable.  She remains on Abilify  2 mg daily  PAF (paroxysmal atrial fibrillation) (HCC) Continue Toprol -XL 12.5 mg twice daily, Eliquis  2.5 mg twice daily, amiodarone  200 mg once daily.  Patient follows with [facility] cardiology.  Family/ staff Communication: discussed with nursing   Camellia Door, DO [facility] [phone]     [1]  Allergies Allergen Reactions   Gentamicin    Levofloxacin Diarrhea

## 2024-08-09 NOTE — Assessment & Plan Note (Signed)
 Continue Toprol -XL 12.5 mg twice daily, Eliquis  2.5 mg twice daily, amiodarone  200 mg once daily.  Patient follows with [facility] cardiology.

## 2024-08-09 NOTE — Assessment & Plan Note (Signed)
 Patient has completed her 40 mg of daily prednisone .  She remains on as needed albuterol  inhaler, Dulera  100/5 2 puffs twice daily, Singulair  10 mg nightly.  She is not wheezing any longer.  COPD exacerbation has resolved.

## 2024-08-09 NOTE — Assessment & Plan Note (Signed)
Continue with pravastatin 20 mg daily

## 2024-08-09 NOTE — Assessment & Plan Note (Signed)
 She remains on Lasix  20 mg daily, losartan  50 mg daily, Toprol -XL 12.5 mg twice daily

## 2024-08-12 ENCOUNTER — Other Ambulatory Visit: Payer: Self-pay | Admitting: *Deleted

## 2024-08-12 NOTE — Patient Outreach (Signed)
" °  08/12/2024  Haelie Clapp 12-29-1932 [id number]   Mrs. Hefel resides in [facility]. [facility] SNF waiver previously utilized for admission.   Update received from [facility] Admissions Coordinator. Mrs. Carriveau continues to receive therapy. She will return to [place] upon SNF discharge.   Will continue to follow.   Pablo Hurst, MSN, RN, BSN   [facility] RN Care Manager Direct Dial: [phone]                "

## 2024-08-16 ENCOUNTER — Non-Acute Institutional Stay (SKILLED_NURSING_FACILITY): Payer: Self-pay | Admitting: Nurse Practitioner

## 2024-08-16 ENCOUNTER — Encounter: Payer: Self-pay | Admitting: Nurse Practitioner

## 2024-08-16 DIAGNOSIS — I480 Paroxysmal atrial fibrillation: Secondary | ICD-10-CM

## 2024-08-16 DIAGNOSIS — E782 Mixed hyperlipidemia: Secondary | ICD-10-CM

## 2024-08-16 DIAGNOSIS — I5022 Chronic systolic (congestive) heart failure: Secondary | ICD-10-CM

## 2024-08-16 DIAGNOSIS — I10 Essential (primary) hypertension: Secondary | ICD-10-CM

## 2024-08-16 DIAGNOSIS — R636 Underweight: Secondary | ICD-10-CM

## 2024-08-16 DIAGNOSIS — F331 Major depressive disorder, recurrent, moderate: Secondary | ICD-10-CM | POA: Diagnosis not present

## 2024-08-16 DIAGNOSIS — J449 Chronic obstructive pulmonary disease, unspecified: Secondary | ICD-10-CM

## 2024-08-16 NOTE — Assessment & Plan Note (Signed)
 LDL at goal Continue with pravastatin  20 mg daily.

## 2024-08-16 NOTE — Assessment & Plan Note (Signed)
 Blood pressure stable,  continues on Lasix  20 mg daily, losartan  50 mg daily, Toprol -XL 12.5 mg twice daily.

## 2024-08-16 NOTE — Assessment & Plan Note (Signed)
 Stable.  She remains on Abilify  2 mg daily. Will continue current regimen

## 2024-08-16 NOTE — Assessment & Plan Note (Signed)
 Stable and at baseline per pt, she has had 2 COPD exacerbations over the last 2 months. Has follow up with pulmonary in 2 days she remains on as needed albuterol  inhaler, Dulera  100/5 2 puffs twice daily.  Will dc singulair  per pharmacy recs as can cause neuropsych events and will defer to pulmonary for further recommendations.

## 2024-08-16 NOTE — Assessment & Plan Note (Signed)
 Euvolemic, continues to follow up with cardiology She remains on Lasix  20 mg daily, losartan  50 mg daily, Toprol -XL 12.5 mg twice daily, potassium 10 mEq once daily, Farxiga  10 mg daily

## 2024-08-16 NOTE — Progress Notes (Signed)
 " Location:  Other [place].  Nursing Home Room Number: [facility] [id number] Place of Service:  SNF [id number]) Harlene An, NP  PCP: Laurence Locus, DO  Patient Care Team: Laurence Locus, DO as PCP - General (Internal Medicine)  Extended Emergency Contact Information Primary Emergency Contact: Cumbee,Carolyn Address: [address] (neighbor - 100 yards away)          [place], [place] [address] [place] Phone: [phone] Relation: Friend Secondary Emergency Contact: Obey,[name] Address: Note:  my last name is OBEY, not [name]  [place] Home Phone: [phone] Relation: Daughter  Goals of care: Advanced Directive information    08/04/2024    6:18 AM  Advanced Directives  Does Patient Have a Medical Advance Directive? Yes  Type of Advance Directive Out of facility DNR (pink MOST or yellow form)  Does patient want to make changes to medical advance directive? No - Patient declined     Chief Complaint  Patient presents with   Discharge Note    Discharge from SNF to AL    HPI:  Pt is a 89 y.o. female seen today for Discharge from SNF to AL.  She was hospitalized for COPD exacerbation with acute on chronic CHF.  She was discharged on prednisone  for 3 days and lasix  20 mg daily  Pt reports she continues to have shortness of breath with activity but this is baseline Continues to have LE edema but no worse than her baseline.  She is walking around with her walker and doing well.  Denies chest pain or palpitations.   Continues to have anxiety but reports this is usual for her. No increase in anxiety or depression.    Past Medical History:  Diagnosis Date   Cancer [facility])    SKIN   Closed fracture of superior ramus of left pubis (HCC) 01/27/2022   COPD, mild (HCC)    Dyspnea    Edema    Heart failure with mildly reduced ejection fraction (HFmrEF) (HCC)    History of delirium 03/05/2021   History of falling 02/01/2022    HLD (hyperlipidemia)    HTN (hypertension)    Hypothyroidism    Macular degeneration, bilateral    Paroxysmal atrial fibrillation (HCC)    Severe major depression, single episode, with psychotic features (HCC) 10/12/2020   Varicosities    Past Surgical History:  Procedure Laterality Date   CATARACT EXTRACTION W/PHACO Right 02/03/2017   Procedure: CATARACT EXTRACTION PHACO AND INTRAOCULAR LENS PLACEMENT (IOC);  Surgeon: Jaye Fallow, MD;  Location: [facility] ORS;  Service: Ophthalmology;  Laterality: Right;  US  00:57.2 AP% 18.5 CDE 10.58 Fluid pack lot # [id number]   CATARACT EXTRACTION W/PHACO Left 02/24/2017   Procedure: CATARACT EXTRACTION PHACO AND INTRAOCULAR LENS PLACEMENT (IOC);  Surgeon: Jaye Fallow, MD;  Location: [facility] ORS;  Service: Ophthalmology;  Laterality: Left;  US  01:12 AP% 25.0 CDE 18.03 Fluid pack lot # [id number]   MOHS SURGERY  2015   nose   SALIVARY GLAND SURGERY Left 2012   TONSILLECTOMY  1940    Allergies[1]  Outpatient Encounter Medications as of 08/16/2024  Medication Sig   acetaminophen  (TYLENOL ) 325 MG tablet Take 650 mg by mouth every 4 (four) hours as needed.   albuterol  (VENTOLIN  HFA) 108 (90 Base) MCG/ACT inhaler Inhale 2 puffs into the lungs every 4 (four) hours as needed for wheezing or shortness of breath.   amiodarone  (PACERONE ) 200 MG tablet Take 200 mg by mouth daily.  apixaban  (ELIQUIS ) 2.5 MG TABS tablet Take 1 tablet (2.5 mg total) by mouth 2 (two) times daily. Refills w/ PCP or cardiology   ARIPiprazole  (ABILIFY ) 2 MG tablet Take 1 tablet (2 mg total) by mouth daily with breakfast.   cetirizine (ZYRTEC) 5 MG tablet Take 5 mg by mouth as needed for allergies.   dapagliflozin  propanediol (FARXIGA ) 10 MG TABS tablet Take 1 tablet (10 mg total) by mouth daily.   furosemide  (LASIX ) 20 MG tablet Take 20 mg by mouth daily.   Glucose 15 GM/32ML GEL Take 1 packet by mouth as needed (for low blood sugar).   losartan  (COZAAR ) 50 MG tablet Take 50  mg by mouth daily.   metoprolol  succinate (TOPROL -XL) 25 MG 24 hr tablet Take 0.5 tablets (12.5 mg total) by mouth 2 (two) times daily.   mometasone -formoterol  (DULERA ) 100-5 MCG/ACT AERO Inhale 2 puffs into the lungs 2 (two) times daily.   montelukast  (SINGULAIR ) 10 MG tablet Take 10 mg by mouth at bedtime.   Multiple Vitamins-Minerals (PRESERVISION AREDS) TABS Take 1 capsule by mouth 2 (two) times daily.   nystatin  (MYCOSTATIN /NYSTOP ) powder Apply 1 Application topically 2 (two) times daily.   Omega-3 Fatty Acids (FISH OIL CONCENTRATE PO) Take 1 tablet by mouth daily.    ondansetron  (ZOFRAN ) 4 MG tablet Take 4 mg by mouth as needed for nausea or vomiting.   OXYGEN 2 lpm for dyspnea or SOB   polyethylene glycol powder (GLYCOLAX /MIRALAX ) 17 GM/SCOOP powder Take by mouth once. 0.5 scoop one time a day every Mon, Wed, Fri   Polyvinyl Alcohol -Povidone PF (REFRESH) 1.4-0.6 % SOLN Place 1 drop into both eyes at bedtime.   potassium chloride  (KLOR-CON  M) 10 MEQ tablet Take 1 tablet (10 mEq total) by mouth daily. Give with furosemide    pravastatin  (PRAVACHOL ) 20 MG tablet Take 1 tablet (20 mg total) by mouth daily at 6 PM.   alum & mag hydroxide-simeth (MAALOX/MYLANTA) 200-200-20 MG/5 SUSP 2 tbsp by mouth every 4 hours as needed for gas, indigestion, or upset stomach (Patient not taking: Reported on 08/16/2024)   ipratropium-albuterol  (DUONEB) 0.5-2.5 (3) MG/3ML SOLN Take 3 mLs by nebulization every 2 (two) hours as needed (shortness of breath). (Patient not taking: Reported on 08/16/2024)   No facility-administered encounter medications on file as of 08/16/2024.    Review of Systems  Constitutional:  Negative for activity change, appetite change, fatigue and unexpected weight change.  HENT:  Negative for congestion and hearing loss.   Eyes: Negative.   Respiratory:  Negative for cough and shortness of breath.   Cardiovascular:  Negative for chest pain, palpitations and leg swelling.   Gastrointestinal:  Negative for abdominal pain, constipation and diarrhea.  Genitourinary:  Negative for difficulty urinating and dysuria.  Musculoskeletal:  Negative for arthralgias and myalgias.  Skin:  Negative for color change and wound.  Neurological:  Negative for dizziness and weakness.  Psychiatric/Behavioral:  Negative for agitation, behavioral problems and confusion.     Immunization History  Administered Date(s) Administered   INFLUENZA, HIGH DOSE SEASONAL PF 05/21/2017, 05/07/2022   Influenza-Unspecified 05/09/2021, 05/15/2023   PFIZER SARS-COV-2 Pediatric Vaccination 5-43yrs 08/09/2019, 09/06/2019, 06/12/2020   PNEUMOCOCCAL CONJUGATE-20 02/11/2022   PPD Test 10/16/2020, 10/26/2020, 02/01/2022, 02/10/2022   Pneumococcal Conjugate-13 08/08/2018   Zoster Recombinant(Shingrix) 02/05/2023, 06/10/2023   Pertinent  Health Maintenance Due  Topic Date Due   Bone Density Scan  Never done   Influenza Vaccine  02/26/2024      11/14/2022    1:17 PM 01/14/2023  1:48 PM 01/30/2023    3:20 PM 03/03/2023    3:50 PM 07/27/2024   10:54 AM  Fall Risk  Falls in the past year? 0 0 0 0 1  Was there an injury with Fall? 0  0  0  0  0  Fall Risk Category Calculator 0 0 0 0 1  Patient at Risk for Falls Due to   No Fall Risks Impaired balance/gait Impaired balance/gait  Fall risk Follow up   Falls evaluation completed Falls evaluation completed Falls evaluation completed     Data saved with a previous flowsheet row definition   Functional Status Survey:    Vitals:   08/16/24 0815  BP: 128/69  Pulse: 73  Resp: 18  Temp: (!) 97.4 F (36.3 C)  SpO2: 93%  Weight: 95 lb 9.6 oz (43.4 kg)  Height: 5' (1.524 m)   Body mass index is 18.67 kg/m. Physical Exam Constitutional:      General: She is not in acute distress.    Appearance: She is well-developed. She is not diaphoretic.  HENT:     Head: Normocephalic and atraumatic.     Mouth/Throat:     Pharynx: No oropharyngeal exudate.   Eyes:     Conjunctiva/sclera: Conjunctivae normal.     Pupils: Pupils are equal, round, and reactive to light.  Cardiovascular:     Rate and Rhythm: Normal rate and regular rhythm.     Heart sounds: Normal heart sounds.  Pulmonary:     Effort: Pulmonary effort is normal.     Breath sounds: Normal breath sounds.     Comments: Diminished breath sounds throughout.  Abdominal:     General: Bowel sounds are normal.     Palpations: Abdomen is soft.  Musculoskeletal:     Cervical back: Normal range of motion and neck supple.     Right lower leg: No edema.     Left lower leg: No edema.  Skin:    General: Skin is warm and dry.  Neurological:     Mental Status: She is alert.  Psychiatric:        Mood and Affect: Mood normal.     Labs reviewed: Recent Labs    10/17/23 1638 10/17/23 1930 10/31/23 0606 03/03/24 0000 07/13/24 0925 08/04/24 0624 08/05/24 0438  NA  --    < > 139   < > 139 142 141  K  --    < > 4.3   < > 4.2 5.6* 3.8  CL  --    < > 99   < > 99 105 102  CO2  --    < > 34*   < > 31 26 29   GLUCOSE  --    < > 94  --  112* 124* 126*  BUN  --    < > 37*   < > 22 29* 32*  CREATININE  --    < > 0.83   < > 1.00 1.08* 0.92  CALCIUM   --    < > 8.5*   < > 9.7 9.4 8.9  MG 2.3  --  2.8*  --   --   --   --   PHOS 3.2  --   --   --   --   --   --    < > = values in this interval not displayed.   Recent Labs    10/17/23 1502  AST 30  ALT 39  ALKPHOS 70  BILITOT  0.9  PROT 6.4*  ALBUMIN 3.2*   Recent Labs    10/26/23 0000 10/28/23 0500 10/29/23 0321 07/13/24 0925 08/04/24 0624 08/05/24 0438  WBC 8.0 13.3*   < > 10.7* 11.0* 13.2*  NEUTROABS 6,304.00 10.0*  --   --  9.3*  --   HGB 11.0* 13.0   < > 13.1 12.7 11.4*  HCT 35* 42.6   < > 40.6 40.6 34.7*  MCV  --  105.7*   < > 99.8 101.8* 97.7  PLT 260 298   < > 203 291 247   < > = values in this interval not displayed.   Lab Results  Component Value Date   TSH 0.968 10/17/2023   Lab Results  Component Value Date    HGBA1C 5.6 11/03/2022   Lab Results  Component Value Date   CHOL 195 07/02/2023   HDL 68 07/02/2023   LDLCALC 108 07/02/2023   TRIG 101 11/03/2022    Significant Diagnostic Results in last 30 days:  DG Chest Portable 1 View Result Date: 08/04/2024 EXAM: 1 VIEW(S) XRAY OF THE CHEST 08/04/2024 06:27:26 AM COMPARISON: 07/13/2024 and earlier CLINICAL HISTORY: 89 year old female with shortness of breath. FINDINGS: LUNGS AND PLEURA: Coarse bilateral pulmonary interstitial opacity appears chronic and stable, and emphysema confirmed on CTA chest last year. Small chronic pleural effusions, also on prior CTA, do not appear significantly changed. [name] No pneumothorax. HEART AND MEDIASTINUM: Tortuous aorta with aortic atherosclerosis. BONES AND SOFT TISSUES: Osteopenia. Thoracic spondylosis. IMPRESSION: 1. Emphysema. Small chronic pleural effusions not significantly changed from last year. 2. No new cardiopulmonary abnormality. Electronically signed by: Helayne Hurst MD MD 08/04/2024 06:45 AM EST RP Workstation: [id number]    Assessment/Plan Underweight Continue nutritional support  PAF (paroxysmal atrial fibrillation) (HCC) Stable, Continue Toprol -XL 12.5 mg twice daily, Eliquis  2.5 mg twice daily, amiodarone  200 mg once daily.  Patient follows with [facility] cardiology.  Mixed hyperlipidemia LDL at goal Continue with pravastatin  20 mg daily.  MDD (major depressive disorder), recurrent episode, moderate (HCC) Stable.  She remains on Abilify  2 mg daily. Will continue current regimen  Essential hypertension Blood pressure stable,  continues on Lasix  20 mg daily, losartan  50 mg daily, Toprol -XL 12.5 mg twice daily.   COPD (chronic obstructive pulmonary disease) (HCC) Stable and at baseline per pt, she has had 2 COPD exacerbations over the last 2 months. Has follow up with pulmonary in 2 days she remains on as needed albuterol  inhaler, Dulera  100/5 2 puffs twice daily.  Will dc singulair  per pharmacy  recs as can cause neuropsych events and will defer to pulmonary for further recommendations.   Chronic systolic CHF (congestive heart failure) (HCC) Euvolemic, continues to follow up with cardiology She remains on Lasix  20 mg daily, losartan  50 mg daily, Toprol -XL 12.5 mg twice daily, potassium 10 mEq once daily, Farxiga  10 mg daily  Stable for DC back to [facility].    Jaylissa Felty K. Caro BODILY [facility] [phone]       [1]  Allergies Allergen Reactions   Gentamicin    Levofloxacin Diarrhea   "

## 2024-08-16 NOTE — Assessment & Plan Note (Signed)
 Stable, Continue Toprol -XL 12.5 mg twice daily, Eliquis  2.5 mg twice daily, amiodarone  200 mg once daily.  Patient follows with [facility] cardiology.

## 2024-08-16 NOTE — Assessment & Plan Note (Signed)
 Continue nutritional support

## 2024-08-17 ENCOUNTER — Other Ambulatory Visit: Payer: Self-pay

## 2024-08-18 ENCOUNTER — Ambulatory Visit: Admitting: Pulmonary Disease

## 2024-08-18 ENCOUNTER — Encounter: Payer: Self-pay | Admitting: Pulmonary Disease

## 2024-08-18 VITALS — BP 110/74 | HR 63 | Temp 97.6°F | Ht 60.0 in | Wt 96.4 lb

## 2024-08-18 DIAGNOSIS — I5020 Unspecified systolic (congestive) heart failure: Secondary | ICD-10-CM

## 2024-08-18 DIAGNOSIS — I480 Paroxysmal atrial fibrillation: Secondary | ICD-10-CM

## 2024-08-18 DIAGNOSIS — Z87891 Personal history of nicotine dependence: Secondary | ICD-10-CM

## 2024-08-18 DIAGNOSIS — R54 Age-related physical debility: Secondary | ICD-10-CM

## 2024-08-18 DIAGNOSIS — J449 Chronic obstructive pulmonary disease, unspecified: Secondary | ICD-10-CM

## 2024-08-18 NOTE — Patient Instructions (Signed)
 VISIT SUMMARY:  During your visit, we discussed your recent hospitalization for breathing difficulties and fluid retention. We reviewed your current COPD management and discussed potential adjustments to your inhaler regimen. We also addressed your leg swelling and the importance of managing fluid retention with your cardiologist.  YOUR PLAN:  -CHRONIC OBSTRUCTIVE PULMONARY DISEASE (COPD): COPD is a chronic lung condition that makes it hard to breathe. Your COPD is currently well-managed with your Dulera  inhaler, and your oxygen levels are good. We discussed the possibility of using a stronger inhaler with three medications in the future to help prevent hospital visits. For now, continue with your current inhaler regimen. We will reassess your COPD management in two months.  -FLUID RETENTION: Fluid retention, which causes swelling in your legs, is likely related to your heart condition rather than your COPD. It is important to follow up with your cardiologist to manage this issue. Elevating your legs during the day may help reduce the swelling.  INSTRUCTIONS:  Please continue using your current inhaler regimen and follow up with your cardiologist regarding fluid retention. We will reassess your COPD management in two months. If you experience any worsening symptoms or have concerns, please contact our office.

## 2024-08-18 NOTE — Progress Notes (Unsigned)
 "  Subjective:    [name], female    DOB: 04-25-1933, 89 y.o.   MRN: [id number]  Patient Care Team: Laurence Locus, DO as PCP - General (Internal Medicine)  Chief Complaint  Patient presents with   Hospitalization Follow-up    Patient denies any cough or breathing problems.     BACKGROUND/INTERVAL:89 y.o. former smoker with medical history significant of HTN, HLD, PAF on Eliquis , COPD, hypothyroidism, admitted to [facility] between 28 October 2023 and 31 October 2023 for atrial fibrillation with RVR.  She also had decompensation of systolic heart failure.  Elevated troponins at that time, B-natriuretic peptide 1228 consistent with CHF, echocardiogram showing LVEF of 35 to 40% a significant decline from her echocardiogram of 23 March.  Presents for evaluation of pulmonary function.  History of COPD with FEV1 unknown and no prior need for inhalers until recent admissions to [facility].  Was discharged on oxygen at 2 L/min due to acute respiratory failure with hypoxia and hypercapnia.  She was initially seen here on 06 November 2023.  Last seen by me on 11 April 2024.  She presents for a scheduled follow-up.  Since her last visit she was admitted to [facility] on 04 August 2018 26 through 05 August 2024 described as a COPD exacerbation however per the patient's friend who presents with her the patient responded to diuretics.  HPI Discussed the use of AI scribe software for clinical note transcription with the patient, who gave verbal consent to proceed.  History of Present Illness   Angela Mason is an 89 year old female with COPD who presents for follow-up after a recent hospitalization for breathing difficulties.  She has a history of COPD and is currently using a Dulera  inhaler, though she is uncertain about its effectiveness. She was recently hospitalized due to an episode of difficulty breathing, which was associated with fluid retention. She was discharged from rehabilitation yesterday.  During  the recent hospitalization, she experienced swelling in her legs, which is a common issue for her. She does not elevate her legs during the day, which may contribute to the swelling. She also mentions having sore spots on one leg, particularly when wearing stockings.  She is not currently using oxygen at home. She is able to walk to her meals and do her laundry, indicating a level of independence in her daily activities.  She has a history of fluid retention, which is believed to be related to cardiac issues rather than her COPD.   She does not endorse any active complaints today.    DATA 04/11/2024 PFTs: FEV1 0.47 L or 38% predicted, FVC 1.16 L or 67% predicted, FEV1/FVC 41%, no bronchodilator response.  Lung volumes showed hyperinflation and air trapping.  Diffusion capacity is severely impaired.  This is consistent with severe obstructive lung disease on the basis of emphysema.   Review of Systems A 10 point review of systems was performed and it is as noted above otherwise negative.   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) 05/12/2022   Mixed hyperlipidemia 04/30/2022   Acquired hypothyroidism 04/30/2022   Protein-calorie malnutrition, severe 01/29/2022   COPD (chronic obstructive pulmonary disease) (HCC) 01/27/2022   MDD (major depressive disorder), recurrent episode, moderate (HCC) 07/31/2021   Insomnia  07/31/2021   Unspecified macular degeneration 10/16/2020   Essential hypertension    Underweight 08/08/2020   Nocturia 10/07/2016   Unstable bladder 10/07/2016   Vaginal atrophy 01/23/2016   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 11/01/2015    Social History    Tobacco Use   Smoking status: Former    Current packs/day: 0.00    Types: Cigarettes    Quit date: 09/05/2005    Years since quitting: 18.9   Smokeless tobacco: Never  Substance Use Topics   Alcohol  use: Not Currently    Allergies[1]  Active Medications[2]  Immunization History  Administered Date(s) Administered   INFLUENZA, HIGH DOSE SEASONAL PF 05/21/2017, 05/07/2022   Influenza-Unspecified 05/09/2021, 05/15/2023   PFIZER SARS-COV-2 Pediatric Vaccination 5-52yrs 08/09/2019, 09/06/2019, 06/12/2020   PNEUMOCOCCAL CONJUGATE-20 02/11/2022   PPD Test 10/16/2020, 10/26/2020, 02/01/2022, 02/10/2022   Pneumococcal Conjugate-13 08/08/2018   Zoster Recombinant(Shingrix) 02/05/2023, 06/10/2023        Objective:     Vitals:   08/18/24 1048  BP: 110/74  Pulse: 63  Temp: 97.6 F (36.4 C)  Height: 5' (1.524 m)  Weight: 96 lb 6.4 oz (43.7 kg)  SpO2: 96%  TempSrc: Temporal  BMI (Calculated): 18.83     SpO2: 96 %  GENERAL: Very frail appearing elderly woman, awake and alert, very responsive.  Ambulatory with assistance of a walker.  No respiratory distress on room air. HEAD: Normocephalic, atraumatic.  EYES: Pupils equal, round, reactive to light.  No scleral icterus.  MOUTH: Poor dentition, oral mucosa moist.  No thrush NECK: Supple. No thyromegaly. Trachea midline. No JVD.  No adenopathy. PULMONARY: Good air entry bilaterally.  No adventitious sounds. CARDIOVASCULAR: S1 and S2. Regular rate and rhythm with occasional extrasystoles.  No rubs murmurs gallops heard. ABDOMEN: Benign. MUSCULOSKELETAL: No joint deformity, no clubbing, 1+ edema of the lower extremities wearing compression stockings.  NEUROLOGIC: No overt focal deficit.  Mild psychomotor retardation. SKIN: Intact,warm,dry. PSYCH: Interacting appropriately, normal mood and behavior.        Assessment & Plan:     ICD-10-CM   1. Stage 3 severe COPD by GOLD classification (HCC)  J44.9     2. Systolic CHF  with reduced left ventricular function, NYHA class 3 (HCC)  I50.20     3. Paroxysmal atrial fibrillation (HCC)  I48.0     4. Frailty  R54      Discussion:    Chronic obstructive pulmonary disease COPD is well-managed with no wheezing and good oxygen levels. Recent hospitalization due to fluid overload, likely cardiac-related, not COPD exacerbation. Current inhaler regimen is effective, but a stronger inhaler with three medications was discussed to potentially prevent hospital visits.  Patient however wants to remain on her current regimen.  Fluid management is primarily cardiac-related and requires cardiologist follow-up. - Continue current inhaler regimen per patient's preference. - Will consider alternative inhaler regimen if needed in the future. - Ensure communication with hospitalist for pulmonologist involvement if hospitalized again. - Will follow up in two months to reassess COPD management.       Advised if symptoms do not improve or worsen, to please contact office for sooner follow up or seek emergency care.    I spent 32 minutes of dedicated to the care of this patient on the date of this encounter to include pre-visit review of records, face-to-face time with the patient discussing conditions above, post visit ordering of testing, clinical documentation with the electronic health record, making appropriate referrals as documented, and communicating necessary  findings to members of the patients care team.     C. Leita Sanders, MD Advanced Bronchoscopy PCCM [facility]    *This note was generated using voice recognition software/Dragon and/or AI transcription program.  Despite best efforts to proofread, errors can occur which can change the meaning. Any transcriptional errors that result from this process are unintentional and may not be fully corrected at the time of dictation.     [1]  Allergies Allergen Reactions   Gentamicin    Levofloxacin  Diarrhea  [2]  Current Meds  Medication Sig   albuterol  (VENTOLIN  HFA) 108 (90 Base) MCG/ACT inhaler Inhale 2 puffs into the lungs every 4 (four) hours as needed for wheezing or shortness of breath.   amiodarone  (PACERONE ) 200 MG tablet Take 200 mg by mouth daily.   apixaban  (ELIQUIS ) 2.5 MG TABS tablet Take 1 tablet (2.5 mg total) by mouth 2 (two) times daily. Refills w/ PCP or cardiology   ARIPiprazole  (ABILIFY ) 2 MG tablet Take 1 tablet (2 mg total) by mouth daily with breakfast.   dapagliflozin  propanediol (FARXIGA ) 10 MG TABS tablet Take 1 tablet (10 mg total) by mouth daily.   furosemide  (LASIX ) 20 MG tablet Take 20 mg by mouth daily.   losartan  (COZAAR ) 50 MG tablet Take 50 mg by mouth daily.   metoprolol  succinate (TOPROL -XL) 25 MG 24 hr tablet Take 0.5 tablets (12.5 mg total) by mouth 2 (two) times daily.   mometasone -formoterol  (DULERA ) 100-5 MCG/ACT AERO Inhale 2 puffs into the lungs 2 (two) times daily.   montelukast  (SINGULAIR ) 10 MG tablet Take 10 mg by mouth at bedtime.   Multiple Vitamins-Minerals (PRESERVISION AREDS) TABS Take 1 capsule by mouth 2 (two) times daily.   Omega-3 Fatty Acids (FISH OIL CONCENTRATE PO) Take 1 tablet by mouth daily.    polyethylene glycol powder (GLYCOLAX /MIRALAX ) 17 GM/SCOOP powder Take by mouth once. 0.5 scoop one time a day every Mon, Wed, Fri   potassium chloride  (KLOR-CON  M) 10 MEQ tablet Take 1 tablet (10 mEq total) by mouth daily. Give with furosemide    pravastatin  (PRAVACHOL ) 20 MG tablet Take 1 tablet (20 mg total) by mouth daily at 6 PM.   "

## 2024-08-19 ENCOUNTER — Encounter: Payer: Self-pay | Admitting: Pulmonary Disease

## 2024-08-25 ENCOUNTER — Non-Acute Institutional Stay: Payer: Self-pay | Admitting: Nurse Practitioner

## 2024-08-25 ENCOUNTER — Encounter: Payer: Self-pay | Admitting: Nurse Practitioner

## 2024-08-25 DIAGNOSIS — I480 Paroxysmal atrial fibrillation: Secondary | ICD-10-CM | POA: Diagnosis not present

## 2024-08-25 DIAGNOSIS — I872 Venous insufficiency (chronic) (peripheral): Secondary | ICD-10-CM | POA: Diagnosis not present

## 2024-08-25 DIAGNOSIS — R636 Underweight: Secondary | ICD-10-CM | POA: Diagnosis not present

## 2024-08-25 DIAGNOSIS — J449 Chronic obstructive pulmonary disease, unspecified: Secondary | ICD-10-CM | POA: Diagnosis not present

## 2024-08-25 DIAGNOSIS — I83029 Varicose veins of left lower extremity with ulcer of unspecified site: Secondary | ICD-10-CM | POA: Diagnosis not present

## 2024-08-25 DIAGNOSIS — L97929 Non-pressure chronic ulcer of unspecified part of left lower leg with unspecified severity: Secondary | ICD-10-CM

## 2024-08-25 DIAGNOSIS — I5022 Chronic systolic (congestive) heart failure: Secondary | ICD-10-CM

## 2024-08-25 NOTE — Progress Notes (Signed)
 " Location:  Other [facility].  Nursing Home Room Number: Delphine [place] [id number] Place of Service:  ALF [phone]) Harlene An, NP  PCP: Laurence Locus, DO  Patient Care Team: Laurence Locus, DO as PCP - General (Internal Medicine)  Extended Emergency Contact Information Primary Emergency Contact: Cumbee,Carolyn Address: [address] (neighbor - 100 yards away)          [place], [place] [address] [place] Phone: [phone] Relation: Friend Secondary Emergency Contact: Obey,[name] Address: Note:  my last name is OBEY, not [name]  [place] Home Phone: [phone] Relation: Daughter  Goals of care: Advanced Directive information    08/04/2024    6:18 AM  Advanced Directives  Does Patient Have a Medical Advance Directive? Yes  Type of Advance Directive Out of facility DNR (pink MOST or yellow form)  Does patient want to make changes to medical advance directive? No - Patient declined     Chief Complaint  Patient presents with   Edema    Edema    HPI:  Pt is a 89 y.o. female seen today for an acute visit for Edema.  The patient, with CHF and COPD, presents with leg swelling and a wound on the left leg for follow-up after recent hospitalization.  She has been experiencing leg swelling and nursing noted a  wound on her left leg, which is weeping and tender to touch. She tries to elevate her legs but finds it uncomfortable.  She denies any worsening shortness of breath or cough and has not experienced any weight gain since returning. She is on Lasix  20 mg daily, which was increased from 10 mg prior to her return. She also takes Farxiga  10 mg, metoprolol  12.5 mg, losartan  50 mg, amlodipine  200 mg, and Eliquis  2.5 mg for her heart conditions.  Her COPD is well managed with Dulera , two puffs twice a day, and she reports no wheezing or significant breathing issues currently. She recently follow up with pulmonary who felt like COPD  was well controlled on current regimen and did not feel like adjustment was needed to inhalers.   She mentions that her diet at the facility includes a lot of sodium, which she did not consume at home. She recalls having 'skinny legs' before and now experiences more swelling, particularly in the left leg.  She has not had a recent cardiology follow-up and is unsure of her next appointment. No palpitations or fast heart rates have been reported. She has kept follow ups routinely and felt like CHF was well controlled.     Past Medical History:  Diagnosis Date   Cancer [facility])    SKIN   Closed fracture of superior ramus of left pubis (HCC) 01/27/2022   COPD, mild (HCC)    Dyspnea    Edema    Heart failure with mildly reduced ejection fraction (HFmrEF) (HCC)    History of delirium 03/05/2021   History of falling 02/01/2022   HLD (hyperlipidemia)    HTN (hypertension)    Hypothyroidism    Macular degeneration, bilateral    Paroxysmal atrial fibrillation (HCC)    Severe major depression, single episode, with psychotic features (HCC) 10/12/2020   Varicosities    Past Surgical History:  Procedure Laterality Date   CATARACT EXTRACTION W/PHACO Right 02/03/2017   Procedure: CATARACT EXTRACTION PHACO AND INTRAOCULAR LENS PLACEMENT (IOC);  Surgeon: Jaye Fallow, MD;  Location: [facility] ORS;  Service: Ophthalmology;  Laterality: Right;  US  00:57.2 AP%  18.5 CDE 10.58 Fluid pack lot # [id number]   CATARACT EXTRACTION W/PHACO Left 02/24/2017   Procedure: CATARACT EXTRACTION PHACO AND INTRAOCULAR LENS PLACEMENT (IOC);  Surgeon: Jaye Fallow, MD;  Location: [facility] ORS;  Service: Ophthalmology;  Laterality: Left;  US  01:12 AP% 25.0 CDE 18.03 Fluid pack lot # [id number]   MOHS SURGERY  2015   nose   SALIVARY GLAND SURGERY Left 2012   TONSILLECTOMY  1940    Allergies[1]  Outpatient Encounter Medications as of 08/25/2024  Medication Sig   acetaminophen  (TYLENOL ) 325 MG tablet Take 650 mg by  mouth every 4 (four) hours as needed.   albuterol  (VENTOLIN  HFA) 108 (90 Base) MCG/ACT inhaler Inhale 2 puffs into the lungs every 4 (four) hours as needed for wheezing or shortness of breath.   amiodarone  (PACERONE ) 200 MG tablet Take 200 mg by mouth daily.   apixaban  (ELIQUIS ) 2.5 MG TABS tablet Take 1 tablet (2.5 mg total) by mouth 2 (two) times daily. Refills w/ PCP or cardiology   ARIPiprazole  (ABILIFY ) 2 MG tablet Take 1 tablet (2 mg total) by mouth daily with breakfast.   cetirizine (ZYRTEC) 5 MG tablet Take 5 mg by mouth as needed for allergies.   dapagliflozin  propanediol (FARXIGA ) 10 MG TABS tablet Take 1 tablet (10 mg total) by mouth daily.   estradiol  (ESTRACE ) 0.01 % CREA vaginal cream Place 1 Applicatorful vaginally 2 (two) times a week.   furosemide  (LASIX ) 20 MG tablet Take 20 mg by mouth daily.   Glucose 15 GM/32ML GEL Take 1 packet by mouth as needed (for low blood sugar).   losartan  (COZAAR ) 50 MG tablet Take 50 mg by mouth daily.   metoprolol  succinate (TOPROL -XL) 25 MG 24 hr tablet Take 0.5 tablets (12.5 mg total) by mouth 2 (two) times daily.   mometasone -formoterol  (DULERA ) 100-5 MCG/ACT AERO Inhale 2 puffs into the lungs 2 (two) times daily.   montelukast  (SINGULAIR ) 10 MG tablet Take 10 mg by mouth at bedtime.   Multiple Vitamins-Minerals (PRESERVISION AREDS) TABS Take 1 capsule by mouth 2 (two) times daily.   nystatin  (MYCOSTATIN /NYSTOP ) powder Apply 1 Application topically 2 (two) times daily.   Omega-3 Fatty Acids (FISH OIL CONCENTRATE PO) Take 1 tablet by mouth daily.    ondansetron  (ZOFRAN ) 4 MG tablet Take 4 mg by mouth as needed for nausea or vomiting.   OXYGEN 2 lpm for dyspnea or SOB   polyethylene glycol powder (GLYCOLAX /MIRALAX ) 17 GM/SCOOP powder Take by mouth once. 0.5 scoop one time a day every Mon, Wed, Fri   Polyvinyl Alcohol -Povidone PF (REFRESH) 1.4-0.6 % SOLN Place 1 drop into both eyes at bedtime.   potassium chloride  (KLOR-CON  M) 10 MEQ tablet Take 1  tablet (10 mEq total) by mouth daily. Give with furosemide    pravastatin  (PRAVACHOL ) 20 MG tablet Take 1 tablet (20 mg total) by mouth daily at 6 PM.   ipratropium-albuterol  (DUONEB) 0.5-2.5 (3) MG/3ML SOLN Take 3 mLs by nebulization every 2 (two) hours as needed (shortness of breath). (Patient not taking: Reported on 08/25/2024)   No facility-administered encounter medications on file as of 08/25/2024.    Review of Systems  Constitutional:  Negative for activity change, appetite change, fatigue and unexpected weight change.  HENT:  Negative for congestion and hearing loss.   Eyes: Negative.   Respiratory:  Negative for cough and shortness of breath.   Cardiovascular:  Positive for leg swelling. Negative for chest pain and palpitations.  Gastrointestinal:  Negative for abdominal pain, anal bleeding, constipation and  diarrhea.  Genitourinary:  Negative for difficulty urinating and dysuria.  Musculoskeletal:  Negative for arthralgias and myalgias.  Skin:  Positive for wound. Negative for color change.  Neurological:  Negative for dizziness and weakness.  Psychiatric/Behavioral:  Negative for agitation, behavioral problems and confusion.     Immunization History  Administered Date(s) Administered   INFLUENZA, HIGH DOSE SEASONAL PF 05/21/2017, 05/07/2022   Influenza-Unspecified 05/09/2021, 05/15/2023, 05/10/2024   PFIZER SARS-COV-2 Pediatric Vaccination 5-50yrs 08/09/2019, 09/06/2019, 06/12/2020   PNEUMOCOCCAL CONJUGATE-20 02/11/2022   PPD Test 10/16/2020, 10/26/2020, 02/01/2022, 02/10/2022   Pneumococcal Conjugate-13 08/08/2018   Zoster Recombinant(Shingrix) 02/05/2023, 06/10/2023   Pertinent  Health Maintenance Due  Topic Date Due   Bone Density Scan  Never done   Influenza Vaccine  Completed      11/14/2022    1:17 PM 01/14/2023    1:48 PM 01/30/2023    3:20 PM 03/03/2023    3:50 PM 07/27/2024   10:54 AM  Fall Risk  Falls in the past year? 0 0 0 0 1  Was there an injury with Fall?  0  0  0  0  0  Fall Risk Category Calculator 0 0 0 0 1  Patient at Risk for Falls Due to   No Fall Risks Impaired balance/gait Impaired balance/gait  Fall risk Follow up   Falls evaluation completed Falls evaluation completed Falls evaluation completed     Data saved with a previous flowsheet row definition   Functional Status Survey:    Vitals:   08/25/24 1643  BP: (!) 156/82  Pulse: 67  Resp: 17  Temp: (!) 97 F (36.1 C)  SpO2: 92%  Weight: 97 lb 9.6 oz (44.3 kg)  Height: 5' (1.524 m)   Body mass index is 19.06 kg/m. Physical Exam Constitutional:      General: She is not in acute distress.    Appearance: She is well-developed. She is not diaphoretic.  HENT:     Head: Normocephalic and atraumatic.     Mouth/Throat:     Pharynx: No oropharyngeal exudate.  Eyes:     Conjunctiva/sclera: Conjunctivae normal.     Pupils: Pupils are equal, round, and reactive to light.  Cardiovascular:     Rate and Rhythm: Normal rate and regular rhythm.     Heart sounds: Normal heart sounds.  Pulmonary:     Effort: Pulmonary effort is normal.     Breath sounds: Normal breath sounds.  Abdominal:     General: Bowel sounds are normal.     Palpations: Abdomen is soft.  Musculoskeletal:     Cervical back: Normal range of motion and neck supple.     Right lower leg: Edema present.     Left lower leg: Edema present.  Skin:    General: Skin is warm and dry.  Neurological:     Mental Status: She is alert.     Motor: Weakness present.  Psychiatric:        Mood and Affect: Mood normal.     Labs reviewed: Recent Labs    10/17/23 1638 10/17/23 1930 10/31/23 0606 03/03/24 0000 07/13/24 0925 08/04/24 0624 08/05/24 0438  NA  --    < > 139   < > 139 142 141  K  --    < > 4.3   < > 4.2 5.6* 3.8  CL  --    < > 99   < > 99 105 102  CO2  --    < > 34*   < >  31 26 29   GLUCOSE  --    < > 94  --  112* 124* 126*  BUN  --    < > 37*   < > 22 29* 32*  CREATININE  --    < > 0.83   < > 1.00  1.08* 0.92  CALCIUM   --    < > 8.5*   < > 9.7 9.4 8.9  MG 2.3  --  2.8*  --   --   --   --   PHOS 3.2  --   --   --   --   --   --    < > = values in this interval not displayed.   Recent Labs    10/17/23 1502 05/30/24 0000  AST 30 41*  ALT 39 58*  ALKPHOS 70 91  BILITOT 0.9  --   PROT 6.4*  --   ALBUMIN 3.2* 4.3   Recent Labs    10/28/23 0500 10/29/23 0321 05/30/24 0000 07/13/24 0925 08/04/24 0624 08/05/24 0438  WBC 13.3*   < > 7.9 10.7* 11.0* 13.2*  NEUTROABS 10.0*  --  6,099.00  --  9.3*  --   HGB 13.0   < > 12.9 13.1 12.7 11.4*  HCT 42.6   < > 40 40.6 40.6 34.7*  MCV 105.7*   < >  --  99.8 101.8* 97.7  PLT 298   < > 233 203 291 247   < > = values in this interval not displayed.   Lab Results  Component Value Date   TSH 0.968 10/17/2023   Lab Results  Component Value Date   HGBA1C 5.6 11/03/2022   Lab Results  Component Value Date   CHOL 199 05/30/2024   HDL 65 05/30/2024   LDLCALC 112 05/30/2024   TRIG 114 05/30/2024    Significant Diagnostic Results in last 30 days:  DG Chest Portable 1 View Result Date: 08/04/2024 EXAM: 1 VIEW(S) XRAY OF THE CHEST 08/04/2024 06:27:26 AM COMPARISON: 07/13/2024 and earlier CLINICAL HISTORY: 89 year old female with shortness of breath. FINDINGS: LUNGS AND PLEURA: Coarse bilateral pulmonary interstitial opacity appears chronic and stable, and emphysema confirmed on CTA chest last year. Small chronic pleural effusions, also on prior CTA, do not appear significantly changed. [name] No pneumothorax. HEART AND MEDIASTINUM: Tortuous aorta with aortic atherosclerosis. BONES AND SOFT TISSUES: Osteopenia. Thoracic spondylosis. IMPRESSION: 1. Emphysema. Small chronic pleural effusions not significantly changed from last year. 2. No new cardiopulmonary abnormality. Electronically signed by: Helayne Hurst MD MD 08/04/2024 06:45 AM EST RP Workstation: [id number]    Assessment/Plan Chronic venous insufficiency with venous leg ulcer New venous leg  ulcer on left leg, likely exacerbated by venous insufficiency. No infection, mild redness due to swelling. - Apply Hydrofera antimicrobial foam to ulcer, change every other day per nursing  - Monitor redness and swelling.  Chronic venous insufficiency  - Elevate legs to heart level for 30-45 minutes in the afternoon. - Ensure proper fit of compression stockings, consider new ones. - Increase protein intake.  Congestive heart failure Stable at this time, cardiology did not feel like recent hospitalization was due to CHF.  On Lasix  20 mg daily - Continue Lasix  20 mg daily. - Order blood work to monitor fluid status and medication effects. - Encourage cardiology follow-up.  Atrial fibrillation Well-controlled with current medication regimen. - Continue amlodipine  200 mg daily and Eliquis  2.5 mg.  Chronic obstructive pulmonary disease Well-managed with current inhaler regimen. Recent evaluation showed no  wheezing, good oxygen levels. - Continue Dulera  two puffs twice a day. - Monitor for exacerbation signs.  Underweight Continue nutritional support and increase protein in diet  Cataleah Stites K. Caro BODILY [facility] [phone]      [1]  Allergies Allergen Reactions   Gentamicin    Levofloxacin Diarrhea   "

## 2024-08-26 ENCOUNTER — Other Ambulatory Visit: Payer: Self-pay | Admitting: *Deleted

## 2024-08-26 NOTE — Patient Outreach (Signed)
" ° °  08/26/2024  Darionna Banke [birth year] [id number]  Mrs. Cabana previously utilized [facility] waiver for admission to [facility] Alfonso, Admissions Director,  previously indicated Mrs. Ninh will return to ALF post skilled stay.  Verified in [facility], Mrs. Gueye returned to [facility]F on 08/17/24.  No identifiable complex care management needs.    Pablo Hurst, MSN, RN, BSN [place]  [facility] RN Care Manager Direct Dial: [phone]                 "

## 2024-09-06 ENCOUNTER — Ambulatory Visit: Admitting: Obstetrics & Gynecology

## 2024-09-19 ENCOUNTER — Ambulatory Visit: Admitting: Obstetrics & Gynecology

## 2024-10-18 ENCOUNTER — Ambulatory Visit: Admitting: Pulmonary Disease
